# Patient Record
Sex: Male | Born: 1950 | Race: Black or African American | Hispanic: No | Smoking: Current every day smoker
Health system: Southern US, Community
[De-identification: ages and names within clinical notes are randomized; demographics above are authoritative.]

## PROBLEM LIST (undated history)

## (undated) DIAGNOSIS — J302 Other seasonal allergic rhinitis: Secondary | ICD-10-CM

## (undated) DIAGNOSIS — M545 Low back pain, unspecified: Secondary | ICD-10-CM

## (undated) DIAGNOSIS — E785 Hyperlipidemia, unspecified: Secondary | ICD-10-CM

## (undated) DIAGNOSIS — T8859XA Other complications of anesthesia, initial encounter: Secondary | ICD-10-CM

## (undated) DIAGNOSIS — T4145XA Adverse effect of unspecified anesthetic, initial encounter: Secondary | ICD-10-CM

## (undated) DIAGNOSIS — M109 Gout, unspecified: Secondary | ICD-10-CM

## (undated) DIAGNOSIS — E1121 Type 2 diabetes mellitus with diabetic nephropathy: Secondary | ICD-10-CM

## (undated) DIAGNOSIS — G8929 Other chronic pain: Secondary | ICD-10-CM

## (undated) DIAGNOSIS — F419 Anxiety disorder, unspecified: Secondary | ICD-10-CM

## (undated) DIAGNOSIS — E119 Type 2 diabetes mellitus without complications: Secondary | ICD-10-CM

## (undated) DIAGNOSIS — J189 Pneumonia, unspecified organism: Secondary | ICD-10-CM

## (undated) DIAGNOSIS — I519 Heart disease, unspecified: Secondary | ICD-10-CM

## (undated) DIAGNOSIS — N184 Chronic kidney disease, stage 4 (severe): Secondary | ICD-10-CM

## (undated) DIAGNOSIS — I10 Essential (primary) hypertension: Secondary | ICD-10-CM

## (undated) DIAGNOSIS — I2510 Atherosclerotic heart disease of native coronary artery without angina pectoris: Secondary | ICD-10-CM

## (undated) DIAGNOSIS — M1990 Unspecified osteoarthritis, unspecified site: Secondary | ICD-10-CM

## (undated) HISTORY — DX: Heart disease, unspecified: I51.9

## (undated) HISTORY — DX: Type 2 diabetes mellitus with diabetic nephropathy: E11.21

## (undated) HISTORY — DX: Anxiety disorder, unspecified: F41.9

## (undated) HISTORY — PX: CORONARY ANGIOPLASTY WITH STENT PLACEMENT: SHX49

## (undated) HISTORY — PX: LAPAROSCOPIC CHOLECYSTECTOMY: SUR755

## (undated) HISTORY — DX: Essential (primary) hypertension: I10

## (undated) HISTORY — DX: Hyperlipidemia, unspecified: E78.5

## (undated) HISTORY — PX: ESOPHAGOGASTRODUODENOSCOPY: SHX1529

---

## 2002-09-05 ENCOUNTER — Ambulatory Visit (HOSPITAL_COMMUNITY): Admission: RE | Admit: 2002-09-05 | Discharge: 2002-09-06 | Payer: Self-pay | Admitting: Cardiovascular Disease

## 2015-08-31 DIAGNOSIS — E785 Hyperlipidemia, unspecified: Secondary | ICD-10-CM | POA: Diagnosis not present

## 2015-08-31 DIAGNOSIS — N29 Other disorders of kidney and ureter in diseases classified elsewhere: Secondary | ICD-10-CM | POA: Diagnosis not present

## 2015-08-31 DIAGNOSIS — R079 Chest pain, unspecified: Secondary | ICD-10-CM | POA: Diagnosis not present

## 2015-08-31 DIAGNOSIS — E11610 Type 2 diabetes mellitus with diabetic neuropathic arthropathy: Secondary | ICD-10-CM | POA: Diagnosis not present

## 2015-09-17 DIAGNOSIS — Z720 Tobacco use: Secondary | ICD-10-CM | POA: Diagnosis not present

## 2015-09-17 DIAGNOSIS — N182 Chronic kidney disease, stage 2 (mild): Secondary | ICD-10-CM | POA: Diagnosis not present

## 2015-09-17 DIAGNOSIS — R809 Proteinuria, unspecified: Secondary | ICD-10-CM | POA: Diagnosis not present

## 2015-09-17 DIAGNOSIS — I10 Essential (primary) hypertension: Secondary | ICD-10-CM | POA: Diagnosis not present

## 2015-10-22 DIAGNOSIS — R809 Proteinuria, unspecified: Secondary | ICD-10-CM | POA: Diagnosis not present

## 2015-10-22 DIAGNOSIS — I129 Hypertensive chronic kidney disease with stage 1 through stage 4 chronic kidney disease, or unspecified chronic kidney disease: Secondary | ICD-10-CM | POA: Diagnosis not present

## 2015-10-22 DIAGNOSIS — Z79899 Other long term (current) drug therapy: Secondary | ICD-10-CM | POA: Diagnosis not present

## 2015-10-22 DIAGNOSIS — N189 Chronic kidney disease, unspecified: Secondary | ICD-10-CM | POA: Diagnosis not present

## 2015-10-22 DIAGNOSIS — E559 Vitamin D deficiency, unspecified: Secondary | ICD-10-CM | POA: Diagnosis not present

## 2015-10-22 DIAGNOSIS — N183 Chronic kidney disease, stage 3 (moderate): Secondary | ICD-10-CM | POA: Diagnosis not present

## 2015-10-22 DIAGNOSIS — D509 Iron deficiency anemia, unspecified: Secondary | ICD-10-CM | POA: Diagnosis not present

## 2015-11-10 DIAGNOSIS — N183 Chronic kidney disease, stage 3 (moderate): Secondary | ICD-10-CM | POA: Diagnosis not present

## 2015-11-10 DIAGNOSIS — I129 Hypertensive chronic kidney disease with stage 1 through stage 4 chronic kidney disease, or unspecified chronic kidney disease: Secondary | ICD-10-CM | POA: Diagnosis not present

## 2015-11-10 DIAGNOSIS — E871 Hypo-osmolality and hyponatremia: Secondary | ICD-10-CM | POA: Diagnosis not present

## 2015-11-10 DIAGNOSIS — Z79899 Other long term (current) drug therapy: Secondary | ICD-10-CM | POA: Diagnosis not present

## 2015-11-17 DIAGNOSIS — D649 Anemia, unspecified: Secondary | ICD-10-CM | POA: Diagnosis not present

## 2015-11-17 DIAGNOSIS — Z720 Tobacco use: Secondary | ICD-10-CM | POA: Diagnosis not present

## 2015-11-17 DIAGNOSIS — N183 Chronic kidney disease, stage 3 (moderate): Secondary | ICD-10-CM | POA: Diagnosis not present

## 2015-11-17 DIAGNOSIS — I10 Essential (primary) hypertension: Secondary | ICD-10-CM | POA: Diagnosis not present

## 2015-11-17 DIAGNOSIS — M109 Gout, unspecified: Secondary | ICD-10-CM | POA: Diagnosis not present

## 2015-11-17 DIAGNOSIS — E1129 Type 2 diabetes mellitus with other diabetic kidney complication: Secondary | ICD-10-CM | POA: Diagnosis not present

## 2016-01-10 ENCOUNTER — Ambulatory Visit (INDEPENDENT_AMBULATORY_CARE_PROVIDER_SITE_OTHER): Payer: Medicare Other | Admitting: Physician Assistant

## 2016-01-10 ENCOUNTER — Encounter: Payer: Self-pay | Admitting: Physician Assistant

## 2016-01-10 VITALS — BP 132/76 | HR 89 | Temp 97.3°F | Ht 66.0 in | Wt 183.0 lb

## 2016-01-10 DIAGNOSIS — Z6829 Body mass index (BMI) 29.0-29.9, adult: Secondary | ICD-10-CM

## 2016-01-10 DIAGNOSIS — E785 Hyperlipidemia, unspecified: Secondary | ICD-10-CM

## 2016-01-10 DIAGNOSIS — J3089 Other allergic rhinitis: Secondary | ICD-10-CM | POA: Diagnosis not present

## 2016-01-10 DIAGNOSIS — I10 Essential (primary) hypertension: Secondary | ICD-10-CM

## 2016-01-10 DIAGNOSIS — Z23 Encounter for immunization: Secondary | ICD-10-CM

## 2016-01-10 DIAGNOSIS — N182 Chronic kidney disease, stage 2 (mild): Secondary | ICD-10-CM | POA: Diagnosis not present

## 2016-01-10 DIAGNOSIS — E1169 Type 2 diabetes mellitus with other specified complication: Secondary | ICD-10-CM | POA: Diagnosis not present

## 2016-01-10 DIAGNOSIS — E119 Type 2 diabetes mellitus without complications: Secondary | ICD-10-CM | POA: Insufficient documentation

## 2016-01-10 DIAGNOSIS — M5136 Other intervertebral disc degeneration, lumbar region: Secondary | ICD-10-CM | POA: Diagnosis not present

## 2016-01-10 DIAGNOSIS — M1A9XX0 Chronic gout, unspecified, without tophus (tophi): Secondary | ICD-10-CM | POA: Diagnosis not present

## 2016-01-10 DIAGNOSIS — M51369 Other intervertebral disc degeneration, lumbar region without mention of lumbar back pain or lower extremity pain: Secondary | ICD-10-CM | POA: Insufficient documentation

## 2016-01-10 DIAGNOSIS — I2510 Atherosclerotic heart disease of native coronary artery without angina pectoris: Secondary | ICD-10-CM

## 2016-01-10 DIAGNOSIS — E1122 Type 2 diabetes mellitus with diabetic chronic kidney disease: Secondary | ICD-10-CM | POA: Diagnosis not present

## 2016-01-10 DIAGNOSIS — Z9861 Coronary angioplasty status: Secondary | ICD-10-CM

## 2016-01-10 LAB — BAYER DCA HB A1C WAIVED: HB A1C (BAYER DCA - WAIVED): 6.7 % (ref <7.0)

## 2016-01-10 MED ORDER — PRAVASTATIN SODIUM 40 MG PO TABS: 40.0000 mg | ORAL_TABLET | Freq: Every day | ORAL | 11 refills | Status: DC

## 2016-01-10 MED ORDER — VENTOLIN HFA 108 (90 BASE) MCG/ACT IN AERS: 2.0000 | INHALATION_SPRAY | Freq: Four times a day (QID) | RESPIRATORY_TRACT | 6 refills | Status: DC | PRN

## 2016-01-10 MED ORDER — PIOGLITAZONE HCL 45 MG PO TABS: 45.0000 mg | ORAL_TABLET | Freq: Every day | ORAL | 11 refills | Status: DC

## 2016-01-10 MED ORDER — UNIFINE PENTIPS 32G X 4 MM MISC: 1.0000 [IU] | Freq: Once | 11 refills | Status: AC

## 2016-01-10 MED ORDER — PRASUGREL HCL 10 MG PO TABS: 10.0000 mg | ORAL_TABLET | Freq: Every day | ORAL | 11 refills | Status: DC

## 2016-01-10 MED ORDER — CETIRIZINE HCL 10 MG PO TABS: 10.0000 mg | ORAL_TABLET | Freq: Every day | ORAL | 11 refills | Status: DC

## 2016-01-10 MED ORDER — LYRICA 200 MG PO CAPS: 200.0000 mg | ORAL_CAPSULE | Freq: Two times a day (BID) | ORAL | 5 refills | Status: DC

## 2016-01-10 MED ORDER — ALLOPURINOL 100 MG PO TABS: 100.0000 mg | ORAL_TABLET | Freq: Three times a day (TID) | ORAL | 6 refills | Status: DC

## 2016-01-10 MED ORDER — HYDROCHLOROTHIAZIDE 25 MG PO TABS: 25.0000 mg | ORAL_TABLET | Freq: Two times a day (BID) | ORAL | 11 refills | Status: DC

## 2016-01-10 MED ORDER — HYDROCODONE-ACETAMINOPHEN 10-325 MG PO TABS: 1.0000 | ORAL_TABLET | Freq: Three times a day (TID) | ORAL | 0 refills | Status: DC | PRN

## 2016-01-10 MED ORDER — CYCLOBENZAPRINE HCL 10 MG PO TABS: 10.0000 mg | ORAL_TABLET | Freq: Three times a day (TID) | ORAL | 5 refills | Status: DC

## 2016-01-10 MED ORDER — CARVEDILOL 25 MG PO TABS: 25.0000 mg | ORAL_TABLET | Freq: Two times a day (BID) | ORAL | 11 refills | Status: DC

## 2016-01-10 MED ORDER — FAMOTIDINE 20 MG PO TABS: 20.0000 mg | ORAL_TABLET | Freq: Every day | ORAL | 11 refills | Status: DC

## 2016-01-10 MED ORDER — HYDROCODONE-ACETAMINOPHEN 10-325 MG PO TABS: 1.0000 | ORAL_TABLET | Freq: Four times a day (QID) | ORAL | 0 refills | Status: DC | PRN

## 2016-01-10 MED ORDER — ULORIC 80 MG PO TABS: 80.0000 mg | ORAL_TABLET | Freq: Every day | ORAL | 11 refills | Status: DC

## 2016-01-10 MED ORDER — ALPRAZOLAM 0.5 MG PO TABS: 0.5000 mg | ORAL_TABLET | Freq: Two times a day (BID) | ORAL | 5 refills | Status: DC | PRN

## 2016-01-10 MED ORDER — VICTOZA 18 MG/3ML ~~LOC~~ SOPN: 1.8000 mg | PEN_INJECTOR | Freq: Once | SUBCUTANEOUS | 11 refills | Status: DC

## 2016-01-10 NOTE — Progress Notes (Signed)
BP 132/76   Pulse 89   Temp 97.3 F (36.3 C) (Oral)   Ht 5' 6"  (1.676 m)   Wt 183 lb (83 kg)   BMI 29.54 kg/m    Subjective:    Patient ID: Tommy Douglas, male    DOB: Apr 22, 1950, 65 y.o.   MRN: [id number]  Tommy Douglas is a 65 y.o. male presenting on 01/10/2016 for Follow-up  HPI Patient here to be established as new patient at [facility].  This patient is known to me from [facility]. This patient comes in to be established. He is a long-standing multiple medical conditions. He is not having any specific problems at this time. All of his medications reviewed and will be refilled as needed. His past medical history is positive for coronary artery disease, non-insulin-dependent diabetes, chronic kidney disease, degenerative disc disease, gout, depression and anxiety, hyperlipidemia. His records are reviewed. He has an appointment next month with his nephrologist.   Relevant past medical, surgical, family and social history reviewed and updated as indicated. Interim medical history since our last visit reviewed. Allergies and medications reviewed and updated.   Data reviewed from any sources in EPIC.  Review of Systems  Constitutional: Negative for appetite change, fatigue and fever.  HENT: Negative.   Eyes: Negative.  Negative for pain and visual disturbance.  Respiratory: Negative.  Negative for cough, chest tightness, shortness of breath and wheezing.   Cardiovascular: Negative.  Negative for chest pain, palpitations and leg swelling.  Gastrointestinal: Negative.  Negative for abdominal pain, diarrhea, nausea and vomiting.  Endocrine: Negative.   Genitourinary: Negative.   Musculoskeletal: Positive for arthralgias, back pain, joint swelling and myalgias.  Skin: Negative.  Negative for color change and rash.  Neurological: Negative.  Negative for weakness, numbness and headaches.  Psychiatric/Behavioral: Negative.      Social History    Social History  . Marital status: Single    Spouse name: N/A  . Number of children: N/A  . Years of education: N/A   Occupational History  . Not on file.   Social History Main Topics  . Smoking status: Current Every Day Smoker  . Smokeless tobacco: Never Used  . Alcohol use No  . Drug use: No  . Sexual activity: Not on file   Other Topics Concern  . Not on file   Social History Narrative  . No narrative on file    Past Surgical History:  Procedure Laterality Date  . CHOLECYSTECTOMY    . HEART STENT      History reviewed. No pertinent family history.    Medication List       Accurate as of 01/10/16 10:20 PM. Always use your most recent med list.          allopurinol 100 MG tablet Commonly known as:  ZYLOPRIM Take 1 tablet (100 mg total) by mouth 3 (three) times daily.   ALPRAZolam 0.5 MG tablet Commonly known as:  XANAX Take 1 tablet (0.5 mg total) by mouth 2 (two) times daily as needed.   carvedilol 25 MG tablet Commonly known as:  COREG Take 1 tablet (25 mg total) by mouth 2 (two) times daily.   cetirizine 10 MG tablet Commonly known as:  ZYRTEC Take 1 tablet (10 mg total) by mouth daily.   cyclobenzaprine 10 MG tablet Commonly known as:  FLEXERIL Take 1 tablet (10 mg total) by mouth 3 (three) times daily.   famotidine 20 MG tablet Commonly known  as:  PEPCID Take 1 tablet (20 mg total) by mouth daily.   hydrochlorothiazide 25 MG tablet Commonly known as:  HYDRODIURIL Take 1 tablet (25 mg total) by mouth 2 (two) times daily.   HYDROcodone-acetaminophen 10-325 MG tablet Commonly known as:  NORCO Take 1 tablet by mouth every 6 (six) hours as needed.   HYDROcodone-acetaminophen 10-325 MG tablet Commonly known as:  NORCO Take 1 tablet by mouth every 8 (eight) hours as needed.   HYDROcodone-acetaminophen 10-325 MG tablet Commonly known as:  NORCO Take 1 tablet by mouth every 8 (eight) hours as needed.   LYRICA 200 MG capsule Generic  drug:  pregabalin Take 1 capsule (200 mg total) by mouth 2 (two) times daily.   pioglitazone 45 MG tablet Commonly known as:  ACTOS Take 1 tablet (45 mg total) by mouth daily.   prasugrel 10 MG Tabs tablet Commonly known as:  EFFIENT Take 1 tablet (10 mg total) by mouth daily.   pravastatin 40 MG tablet Commonly known as:  PRAVACHOL Take 1 tablet (40 mg total) by mouth daily.   predniSONE 10 MG tablet Commonly known as:  DELTASONE Take 10 mg by mouth daily.   ULORIC 80 MG Tabs Generic drug:  Febuxostat Take 1 tablet (80 mg total) by mouth daily.   UNIFINE PENTIPS 32G X 4 MM Misc Generic drug:  Insulin Pen Needle Inject 1 Units into the skin once.   VENTOLIN HFA 108 (90 Base) MCG/ACT inhaler Generic drug:  albuterol Inhale 2 puffs into the lungs every 6 (six) hours as needed for wheezing or shortness of breath.   VICTOZA 18 MG/3ML Sopn Generic drug:  liraglutide Inject 0.3 mLs (1.8 mg total) into the skin once.          Objective:    BP 132/76   Pulse 89   Temp 97.3 F (36.3 C) (Oral)   Ht 5' 6"  (1.676 m)   Wt 183 lb (83 kg)   BMI 29.54 kg/m   Allergies  Allergen Reactions  . Atorvastatin     All statins make him cough   Wt Readings from Last 3 Encounters:  01/10/16 183 lb (83 kg)    Physical Exam  Constitutional: He appears well-developed and well-nourished. No distress.  HENT:  Head: Normocephalic and atraumatic.  Eyes: Conjunctivae and EOM are normal. Pupils are equal, round, and reactive to light.  Neck: Normal range of motion. Neck supple.  Cardiovascular: Normal rate, regular rhythm and normal heart sounds.   Pulmonary/Chest: Effort normal and breath sounds normal. No respiratory distress.  Abdominal: Soft. Bowel sounds are normal.  Musculoskeletal: Normal range of motion.       Lumbar back: He exhibits tenderness, pain and spasm.  Neurological:  Reflex Scores:      Patellar reflexes are 1+ on the right side and 3+ on the left side. Skin:  Skin is warm and dry.  Psychiatric: He has a normal mood and affect. His behavior is normal.  Nursing note and vitals reviewed.   Results for orders placed or performed in visit on 01/10/16  Bayer DCA Hb A1c Waived  Result Value Ref Range   Bayer DCA Hb A1c Waived 6.7 <7.0 %      Assessment & Plan:   1. Essential hypertension - CBC with Differential/Platelet - prasugrel (EFFIENT) 10 MG TABS tablet; Take 1 tablet (10 mg total) by mouth daily.  Dispense: 30 tablet; Refill: 11 - carvedilol (COREG) 25 MG tablet; Take 1 tablet (25 mg total) by  mouth 2 (two) times daily.  Dispense: 60 tablet; Refill: 11 - hydrochlorothiazide (HYDRODIURIL) 25 MG tablet; Take 1 tablet (25 mg total) by mouth 2 (two) times daily.  Dispense: 60 tablet; Refill: 11  2. Coronary artery disease involving native heart, angina presence unspecified, unspecified vessel or lesion type - CBC with Differential/Platelet - CMP14+EGFR - Lipid panel - carvedilol (COREG) 25 MG tablet; Take 1 tablet (25 mg total) by mouth 2 (two) times daily.  Dispense: 60 tablet; Refill: 11  3. 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  Dispense: 30 each; Refill: 11 - VICTOZA 18 MG/3ML SOPN; Inject 0.3 mLs (1.8 mg total) into the skin once.  Dispense: 6 mL; Refill: 11 - pioglitazone (ACTOS) 45 MG tablet; Take 1 tablet (45 mg total) by mouth daily.  Dispense: 30 tablet; Refill: 11  4. Hyperlipidemia associated with type 2 diabetes mellitus (HCC) - pravastatin (PRAVACHOL) 40 MG tablet; Take 1 tablet (40 mg total) by mouth daily.  Dispense: 30 tablet; Refill: 11  5. Chronic gout without tophus, unspecified cause, unspecified site - Lipid panel - allopurinol (ZYLOPRIM) 100 MG tablet; Take 1 tablet (100 mg total) by mouth 3 (three) times daily.  Dispense: 90 tablet;  Refill: 6 - ULORIC 80 MG TABS; Take 1 tablet (80 mg total) by mouth daily.  Dispense: 30 tablet; Refill: 11  6. 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  Dispense: 30 tablet; Refill: 11  7. DDD (degenerative disc disease), lumbar - cyclobenzaprine (FLEXERIL) 10 MG tablet; Take 1 tablet (10 mg total) by mouth 3 (three) times daily.  Dispense: 90 tablet; Refill: 5 - LYRICA 200 MG capsule; Take 1 capsule (200 mg total) by mouth 2 (two) times daily.  Dispense: 60 capsule; Refill: 5 - HYDROcodone-acetaminophen (NORCO) 10-325 MG tablet; Take 1 tablet by mouth every 6 (six) hours as needed.  Dispense: 120 tablet; Refill: 0 - HYDROcodone-acetaminophen (NORCO) 10-325 MG tablet; Take 1 tablet by mouth every 8 (eight) hours as needed.  Dispense: 120 tablet; Refill: 0 - HYDROcodone-acetaminophen (NORCO) 10-325 MG tablet; Take 1 tablet by mouth every 8 (eight) hours as needed.  Dispense: 120 tablet; Refill: 0   Continue all other maintenance medications as listed above. Educational handout given for kidney disease  Follow up plan: Return in about 3 months (around 04/11/2016).  Terald Sleeper PA-C [facility] [address]  [place], [place] [address] [phone]   01/10/2016, 10:20 PM

## 2016-01-10 NOTE — Patient Instructions (Signed)
Chronic Kidney Disease °Chronic kidney disease occurs when the kidneys are damaged over a long period. The kidneys are two organs that lie on either side of the spine between the middle of the back and the front of the abdomen. The kidneys: °· Remove wastes and extra water from the blood. °· Produce important hormones. These help keep bones strong, regulate blood pressure, and help create red blood cells. °· Balance the fluids and chemicals in the blood and tissues. °A small amount of kidney damage may not cause problems, but a large amount of damage may make it difficult or impossible for the kidneys to work the way they should. If steps are not taken to slow down the kidney damage or stop it from getting worse, the kidneys may stop working permanently. Most of the time, chronic kidney disease does not go away. However, it can often be controlled, and those with the disease can usually live normal lives. °CAUSES °The most common causes of chronic kidney disease are diabetes and high blood pressure (hypertension). Chronic kidney disease may also be caused by: °· Diseases that cause the kidneys' filters to become inflamed. °· Diseases that affect the immune system. °· Genetic diseases. °· Medicines that damage the kidneys, such as anti-inflammatory medicines. °· Poisoning or exposure to toxic substances. °· A reoccurring kidney or urinary infection. °· A problem with urine flow. This may be caused by: °¨ Cancer. °¨ Kidney stones. °¨ An enlarged prostate in males. °SIGNS AND SYMPTOMS °Because the kidney damage in chronic kidney disease occurs slowly, symptoms develop slowly and may not be obvious until the kidney damage becomes severe. A person may have a kidney disease for years without showing any symptoms. Symptoms can include: °· Swelling (edema) of the legs, ankles, or feet. °· Tiredness (lethargy). °· Nausea or vomiting. °· Confusion. °· Problems with urination, such as: °¨ Decreased urine  production. °¨ Frequent urination, especially at night. °¨ Frequent accidents in children who are potty trained. °· Muscle twitches and cramps. °· Shortness of breath. °· Weakness. °· Persistent itchiness. °· Loss of appetite. °· Metallic taste in the mouth. °· Trouble sleeping. °· Slowed development in children. °· Short stature in children. °DIAGNOSIS °Chronic kidney disease may be detected and diagnosed by tests, including blood, urine, imaging, or kidney biopsy tests. °TREATMENT °Most chronic kidney diseases cannot be cured. Treatment usually involves relieving symptoms and preventing or slowing the progression of the disease. Treatment may include: °· A special diet. You may need to avoid alcohol and foods that are salty and high in potassium. °· Medicines. These may: °¨ Lower blood pressure. °¨ Relieve anemia. °¨ Relieve swelling. °¨ Protect the bones. °HOME CARE INSTRUCTIONS °· Follow your prescribed diet.  Your health care provider may instruct you to limit daily salt (sodium) and protein intake. °· Take medicines only as directed by your health care provider. Do not take any new medicines (prescription, over-the-counter, or nutritional supplements) unless approved by your health care provider. Many medicines can worsen your kidney damage or need to have the dose adjusted.   °· Quit smoking if you smoke. Talk to your health care provider about a smoking cessation program. °· Keep all follow-up visits as directed by your health care provider. °· Monitor your blood pressure. °· Start or continue an exercise plan. °· Get immunizations as directed by your health care provider. °· Take vitamin and mineral supplements as directed by your health care provider. °SEEK IMMEDIATE MEDICAL CARE IF: °· Your symptoms get worse or you develop   new symptoms. °· You develop symptoms of end-stage kidney disease. These include: °¨ Headaches. °¨ Abnormally dark or light skin. °¨ Numbness in the hands or feet. °¨ Easy  bruising. °¨ Frequent hiccups. °¨ Menstruation stops. °· You have a fever. °· You have decreased urine production. °· You have pain or bleeding when urinating. °MAKE SURE YOU: °· Understand these instructions. °· Will watch your condition. °· Will get help right away if you are not doing well or get worse. °FOR MORE INFORMATION  °· American Association of Kidney Patients: www.aakp.org °· National Kidney Foundation: www.kidney.org °· American Kidney Fund: www.akfinc.org °· [employer] [facility]: www.lifeoptions.org and www.kidneyschool.org °  °This information is not intended to replace advice given to you by your health care provider. Make sure you discuss any questions you have with your health care provider. °  °Document Released: 12/21/2007 Document Revised: 04/03/2014 Document Reviewed: 11/10/2011 °Elsevier Interactive Patient Education ©2016 [employer]. ° °

## 2016-01-12 LAB — CBC WITH DIFFERENTIAL/PLATELET
BASOS ABS: 0.1 10*3/uL (ref 0.0–0.2)
BASOS: 1 %
EOS (ABSOLUTE): 0.5 10*3/uL — AB (ref 0.0–0.4)
Eos: 6 %
HEMOGLOBIN: 12.4 g/dL — AB (ref 12.6–17.7)
Hematocrit: 38.2 % (ref 37.5–51.0)
IMMATURE GRANS (ABS): 0 10*3/uL (ref 0.0–0.1)
IMMATURE GRANULOCYTES: 0 %
LYMPHS: 30 %
Lymphocytes Absolute: 2.6 10*3/uL (ref 0.7–3.1)
MCH: 27.7 pg (ref 26.6–33.0)
MCHC: 32.5 g/dL (ref 31.5–35.7)
MCV: 86 fL (ref 79–97)
MONOCYTES: 7 %
Monocytes Absolute: 0.6 10*3/uL (ref 0.1–0.9)
NEUTROS ABS: 4.8 10*3/uL (ref 1.4–7.0)
NEUTROS PCT: 56 %
PLATELETS: 304 10*3/uL (ref 150–379)
RBC: 4.47 x10E6/uL (ref 4.14–5.80)
RDW: 13.3 % (ref 12.3–15.4)
WBC: 8.5 10*3/uL (ref 3.4–10.8)

## 2016-01-12 LAB — CMP14+EGFR
ALBUMIN: 4.1 g/dL (ref 3.6–4.8)
ALT: 12 IU/L (ref 0–44)
AST: 20 IU/L (ref 0–40)
Albumin/Globulin Ratio: 1.1 — ABNORMAL LOW (ref 1.2–2.2)
Alkaline Phosphatase: 91 IU/L (ref 39–117)
BUN / CREAT RATIO: 14 (ref 10–24)
BUN: 24 mg/dL (ref 8–27)
Bilirubin Total: 0.3 mg/dL (ref 0.0–1.2)
CALCIUM: 10.1 mg/dL (ref 8.6–10.2)
CO2: 21 mmol/L (ref 18–29)
Chloride: 98 mmol/L (ref 96–106)
Creatinine, Ser: 1.74 mg/dL — ABNORMAL HIGH (ref 0.76–1.27)
GFR, EST AFRICAN AMERICAN: 47 mL/min/{1.73_m2} — AB (ref >59)
GFR, EST NON AFRICAN AMERICAN: 40 mL/min/{1.73_m2} — AB (ref >59)
GLUCOSE: 144 mg/dL — AB (ref 65–99)
Globulin, Total: 3.7 g/dL (ref 1.5–4.5)
Potassium: 4.5 mmol/L (ref 3.5–5.2)
Sodium: 137 mmol/L (ref 134–144)
TOTAL PROTEIN: 7.8 g/dL (ref 6.0–8.5)

## 2016-01-12 LAB — LIPID PANEL
CHOL/HDL RATIO: 9.6 ratio — AB (ref 0.0–5.0)
Cholesterol, Total: 259 mg/dL — ABNORMAL HIGH (ref 100–199)
HDL: 27 mg/dL — AB (ref >39)
LDL CALC: 158 mg/dL — AB (ref 0–99)
Triglycerides: 368 mg/dL — ABNORMAL HIGH (ref 0–149)
VLDL CHOLESTEROL CAL: 74 mg/dL — AB (ref 5–40)

## 2016-01-12 MED ORDER — CHOLINE FENOFIBRATE 135 MG PO CPDR: 135.0000 mg | DELAYED_RELEASE_CAPSULE | Freq: Every day | ORAL | 2 refills | Status: DC

## 2016-01-12 NOTE — Addendum Note (Signed)
Addended by: Thana Ates on: 01/12/2016 08:37 AM   Modules accepted: Orders

## 2016-02-23 DIAGNOSIS — R809 Proteinuria, unspecified: Secondary | ICD-10-CM | POA: Diagnosis not present

## 2016-02-23 DIAGNOSIS — E1129 Type 2 diabetes mellitus with other diabetic kidney complication: Secondary | ICD-10-CM | POA: Diagnosis not present

## 2016-02-23 DIAGNOSIS — N183 Chronic kidney disease, stage 3 (moderate): Secondary | ICD-10-CM | POA: Diagnosis not present

## 2016-02-23 DIAGNOSIS — I10 Essential (primary) hypertension: Secondary | ICD-10-CM | POA: Diagnosis not present

## 2016-04-25 ENCOUNTER — Ambulatory Visit (INDEPENDENT_AMBULATORY_CARE_PROVIDER_SITE_OTHER): Payer: Medicare Other | Admitting: Physician Assistant

## 2016-04-25 VITALS — BP 134/78 | HR 100 | Temp 97.2°F | Ht 66.0 in | Wt 186.2 lb

## 2016-04-25 DIAGNOSIS — N182 Chronic kidney disease, stage 2 (mild): Secondary | ICD-10-CM

## 2016-04-25 DIAGNOSIS — I2510 Atherosclerotic heart disease of native coronary artery without angina pectoris: Secondary | ICD-10-CM

## 2016-04-25 DIAGNOSIS — M5136 Other intervertebral disc degeneration, lumbar region: Secondary | ICD-10-CM | POA: Diagnosis not present

## 2016-04-25 DIAGNOSIS — E1122 Type 2 diabetes mellitus with diabetic chronic kidney disease: Secondary | ICD-10-CM

## 2016-04-25 DIAGNOSIS — E785 Hyperlipidemia, unspecified: Secondary | ICD-10-CM

## 2016-04-25 DIAGNOSIS — M1A9XX0 Chronic gout, unspecified, without tophus (tophi): Secondary | ICD-10-CM

## 2016-04-25 DIAGNOSIS — J3089 Other allergic rhinitis: Secondary | ICD-10-CM

## 2016-04-25 DIAGNOSIS — E1169 Type 2 diabetes mellitus with other specified complication: Secondary | ICD-10-CM | POA: Diagnosis not present

## 2016-04-25 DIAGNOSIS — I10 Essential (primary) hypertension: Secondary | ICD-10-CM | POA: Diagnosis not present

## 2016-04-25 DIAGNOSIS — R5383 Other fatigue: Secondary | ICD-10-CM | POA: Diagnosis not present

## 2016-04-25 MED ORDER — PRAVASTATIN SODIUM 40 MG PO TABS: 40.0000 mg | ORAL_TABLET | Freq: Every day | ORAL | 11 refills | Status: DC

## 2016-04-25 MED ORDER — PIOGLITAZONE HCL 45 MG PO TABS: 45.0000 mg | ORAL_TABLET | Freq: Every day | ORAL | 11 refills | Status: DC

## 2016-04-25 MED ORDER — HYDROCODONE-ACETAMINOPHEN 10-325 MG PO TABS: 1.0000 | ORAL_TABLET | Freq: Three times a day (TID) | ORAL | 0 refills | Status: DC | PRN

## 2016-04-25 MED ORDER — VICTOZA 18 MG/3ML ~~LOC~~ SOPN: 1.8000 mg | PEN_INJECTOR | Freq: Once | SUBCUTANEOUS | 11 refills | Status: DC

## 2016-04-25 MED ORDER — CYCLOBENZAPRINE HCL 10 MG PO TABS: 10.0000 mg | ORAL_TABLET | Freq: Three times a day (TID) | ORAL | 5 refills | Status: DC

## 2016-04-25 MED ORDER — CETIRIZINE HCL 10 MG PO TABS: 10.0000 mg | ORAL_TABLET | Freq: Every day | ORAL | 11 refills | Status: DC

## 2016-04-25 MED ORDER — CARVEDILOL 25 MG PO TABS: 25.0000 mg | ORAL_TABLET | Freq: Two times a day (BID) | ORAL | 11 refills | Status: DC

## 2016-04-25 MED ORDER — HYDROCODONE-ACETAMINOPHEN 10-325 MG PO TABS: 1.0000 | ORAL_TABLET | Freq: Four times a day (QID) | ORAL | 0 refills | Status: DC | PRN

## 2016-04-25 MED ORDER — HYDROCHLOROTHIAZIDE 25 MG PO TABS: 25.0000 mg | ORAL_TABLET | Freq: Two times a day (BID) | ORAL | 11 refills | Status: DC

## 2016-04-25 MED ORDER — ULORIC 80 MG PO TABS: 80.0000 mg | ORAL_TABLET | Freq: Every day | ORAL | 11 refills | Status: DC

## 2016-04-25 MED ORDER — CHOLINE FENOFIBRATE 135 MG PO CPDR
135.0000 mg | DELAYED_RELEASE_CAPSULE | Freq: Every day | ORAL | 11 refills | Status: DC
Start: 2016-04-25 — End: 2016-08-02

## 2016-04-25 MED ORDER — ALPRAZOLAM 0.5 MG PO TABS: 0.5000 mg | ORAL_TABLET | Freq: Two times a day (BID) | ORAL | 5 refills | Status: DC | PRN

## 2016-04-25 MED ORDER — PRASUGREL HCL 10 MG PO TABS: 10.0000 mg | ORAL_TABLET | Freq: Every day | ORAL | 11 refills | Status: DC

## 2016-04-25 NOTE — Patient Instructions (Addendum)
Carbohydrate Counting for Diabetes Mellitus, Adult Carbohydrate counting is a method for keeping track of how many carbohydrates you eat. Eating carbohydrates naturally increases the amount of sugar (glucose) in the blood. Counting how many carbohydrates you eat helps keep your blood glucose within normal limits, which helps you manage your diabetes (diabetes mellitus). It is important to know how many carbohydrates you can safely have in each meal. This is different for every person. A diet and nutrition specialist (registered dietitian) can help you make a meal plan and calculate how many carbohydrates you should have at each meal and snack. Carbohydrates are found in the following foods:  Grains, such as breads and cereals.  Dried beans and soy products.  Starchy vegetables, such as potatoes, peas, and corn.  Fruit and fruit juices.  Milk and yogurt.  Sweets and snack foods, such as cake, cookies, candy, chips, and soft drinks. How do I count carbohydrates? There are two ways to count carbohydrates in food. You can use either of the methods or a combination of both. Reading "Nutrition Facts" on packaged food  The "Nutrition Facts" list is included on the labels of almost all packaged foods and beverages in the [place] It includes:  The serving size.  Information about nutrients in each serving, including the grams (g) of carbohydrate per serving. To use the "Nutrition Facts":  Decide how many servings you will have.  Multiply the number of servings by the number of carbohydrates per serving.  The resulting number is the total amount of carbohydrates that you will be having. Learning standard serving sizes of other foods  When you eat foods containing carbohydrates that are not packaged or do not include "Nutrition Facts" on the label, you need to measure the servings in order to count the amount of carbohydrates:  Measure the foods that you will eat with a food scale or measuring  cup, if needed.  Decide how many standard-size servings you will eat.  Multiply the number of servings by 15. Most carbohydrate-rich foods have about 15 g of carbohydrates per serving.  For example, if you eat 8 oz (170 g) of strawberries, you will have eaten 2 servings and 30 g of carbohydrates (2 servings x 15 g = 30 g).  For foods that have more than one food mixed, such as soups and casseroles, you must count the carbohydrates in each food that is included. The following list contains standard serving sizes of common carbohydrate-rich foods. Each of these servings has about 15 g of carbohydrates:   hamburger bun or  English muffin.   oz (15 mL) syrup.   oz (14 g) jelly.  1 slice of bread.  1 six-inch tortilla.  3 oz (85 g) cooked rice or pasta.  4 oz (113 g) cooked dried beans.  4 oz (113 g) starchy vegetable, such as peas, corn, or potatoes.  4 oz (113 g) hot cereal.  4 oz (113 g) mashed potatoes or  of a large baked potato.  4 oz (113 g) canned or frozen fruit.  4 oz (120 mL) fruit juice.  4-6 crackers.  6 chicken nuggets.  6 oz (170 g) unsweetened dry cereal.  6 oz (170 g) plain fat-free yogurt or yogurt sweetened with artificial sweeteners.  8 oz (240 mL) milk.  8 oz (170 g) fresh fruit or one small piece of fruit.  24 oz (680 g) popped popcorn. Example of carbohydrate counting Sample meal  3 oz (85 g) chicken breast.  6 oz (  170 g) brown rice.  4 oz (113 g) corn.  8 oz (240 mL) milk.  8 oz (170 g) strawberries with sugar-free whipped topping. Carbohydrate calculation 1. Identify the foods that contain carbohydrates:  Rice.  Corn.  Milk.  Strawberries. 2. Calculate how many servings you have of each food:  2 servings rice.  1 serving corn.  1 serving milk.  1 serving strawberries. 3. Multiply each number of servings by 15 g:  2 servings rice x 15 g = 30 g.  1 serving corn x 15 g = 15 g.  1 serving milk x 15 g = 15  g.  1 serving strawberries x 15 g = 15 g. 4. Add together all of the amounts to find the total grams of carbohydrates eaten:  30 g + 15 g + 15 g + 15 g = 75 g of carbohydrates total. This information is not intended to replace advice given to you by your health care provider. Make sure you discuss any questions you have with your health care provider. Document Released: 03/13/2005 Document Revised: 10/01/2015 Document Reviewed: 08/25/2015 Elsevier Interactive Patient Education  2017 [employer]. bronchitis

## 2016-04-26 LAB — CMP14+EGFR
A/G RATIO: 1.2 (ref 1.2–2.2)
ALK PHOS: 85 IU/L (ref 39–117)
ALT: 11 IU/L (ref 0–44)
AST: 17 IU/L (ref 0–40)
Albumin: 4.1 g/dL (ref 3.6–4.8)
BUN/Creatinine Ratio: 11 (ref 10–24)
BUN: 21 mg/dL (ref 8–27)
Bilirubin Total: 0.3 mg/dL (ref 0.0–1.2)
CO2: 26 mmol/L (ref 18–29)
Calcium: 10.1 mg/dL (ref 8.6–10.2)
Chloride: 97 mmol/L (ref 96–106)
Creatinine, Ser: 1.83 mg/dL — ABNORMAL HIGH (ref 0.76–1.27)
GFR calc Af Amer: 44 mL/min/{1.73_m2} — ABNORMAL LOW (ref >59)
GFR calc non Af Amer: 38 mL/min/{1.73_m2} — ABNORMAL LOW (ref >59)
GLOBULIN, TOTAL: 3.4 g/dL (ref 1.5–4.5)
Glucose: 156 mg/dL — ABNORMAL HIGH (ref 65–99)
POTASSIUM: 4.3 mmol/L (ref 3.5–5.2)
SODIUM: 138 mmol/L (ref 134–144)
Total Protein: 7.5 g/dL (ref 6.0–8.5)

## 2016-04-26 LAB — LIPID PANEL
CHOL/HDL RATIO: 7.6 ratio — AB (ref 0.0–5.0)
Cholesterol, Total: 191 mg/dL (ref 100–199)
HDL: 25 mg/dL — ABNORMAL LOW (ref >39)
LDL CALC: 108 mg/dL — AB (ref 0–99)
Triglycerides: 292 mg/dL — ABNORMAL HIGH (ref 0–149)
VLDL Cholesterol Cal: 58 mg/dL — ABNORMAL HIGH (ref 5–40)

## 2016-04-26 LAB — THYROID PANEL WITH TSH
Free Thyroxine Index: 2.2 (ref 1.2–4.9)
T3 Uptake Ratio: 28 % (ref 24–39)
T4 TOTAL: 7.7 ug/dL (ref 4.5–12.0)
TSH: 2.17 u[IU]/mL (ref 0.450–4.500)

## 2016-04-27 ENCOUNTER — Encounter: Payer: Self-pay | Admitting: Physician Assistant

## 2016-04-27 NOTE — Progress Notes (Signed)
BP 134/78   Pulse 100   Temp 97.2 F (36.2 C) (Oral)   Ht 5' 6"  (1.676 m)   Wt 186 lb 3.2 oz (84.5 kg)   BMI 30.05 kg/m    Subjective:    Patient ID: Tommy Douglas, male    DOB: 04/15/1950, 66 y.o.   MRN: [id number]  HPI: Tommy Douglas is a 66 y.o. male presenting on 04/25/2016 for Diabetes; Hypertension; Hyperlipidemia; and Fatigue  This patient comes in for periodic recheck on medications and conditions. All medications are reviewed today. There are no reports of any problems with the medications. All of the medical conditions are reviewed and updated.  Lab work is reviewed and will be ordered as medically necessary. .  He does a great increase in fatigue. He does not note that it is related to changes in sugars or urine output. Has been seeing Dr. Lowanda Foster for the kidney failure. He states it is very cold at times. There is no increased amount of gout outbreaks.  Past Medical History:  Diagnosis Date  . Allergy   . Anxiety   . Diabetes mellitus without complication ([place])   . Heart disease   . Hyperlipidemia   . Hypertension    Relevant past medical, surgical, family and social history reviewed and updated as indicated. Interim medical history since our last visit reviewed. Allergies and medications reviewed and updated. DATA REVIEWED: CHART IN EPIC  Social History   Social History  . Marital status: Single    Spouse name: N/A  . Number of children: N/A  . Years of education: N/A   Occupational History  . Not on file.   Social History Main Topics  . Smoking status: Current Every Day Smoker  . Smokeless tobacco: Never Used  . Alcohol use No  . Drug use: No  . Sexual activity: Not on file   Other Topics Concern  . Not on file   Social History Narrative  . No narrative on file    Past Surgical History:  Procedure Laterality Date  . CHOLECYSTECTOMY    . HEART STENT      No family history on file.  Review of Systems  Constitutional: Positive for  fatigue. Negative for appetite change and unexpected weight change.  HENT: Negative.   Eyes: Negative.  Negative for pain and visual disturbance.  Respiratory: Negative.  Negative for cough, chest tightness, shortness of breath and wheezing.   Cardiovascular: Negative.  Negative for chest pain, palpitations and leg swelling.  Gastrointestinal: Negative.  Negative for abdominal pain, diarrhea, nausea and vomiting.  Endocrine: Positive for cold intolerance. Negative for polydipsia, polyphagia and polyuria.  Genitourinary: Negative.   Musculoskeletal: Negative.   Skin: Negative.  Negative for color change and rash.  Neurological: Negative.  Negative for dizziness, weakness, numbness and headaches.  Psychiatric/Behavioral: Negative.     Allergies as of 04/25/2016      Reactions   Atorvastatin    All statins make him cough      Medication List       Accurate as of 04/25/16 11:59 PM. Always use your most recent med list.          ALPRAZolam 0.5 MG tablet Commonly known as:  XANAX Take 1 tablet (0.5 mg total) by mouth 2 (two) times daily as needed.   carvedilol 25 MG tablet Commonly known as:  COREG Take 1 tablet (25 mg total) by mouth 2 (two) times daily.   cetirizine 10 MG tablet Commonly  known as:  ZYRTEC Take 1 tablet (10 mg total) by mouth daily.   Choline Fenofibrate 135 MG capsule Take 1 capsule (135 mg total) by mouth daily.   cyclobenzaprine 10 MG tablet Commonly known as:  FLEXERIL Take 1 tablet (10 mg total) by mouth 3 (three) times daily.   famotidine 20 MG tablet Commonly known as:  PEPCID Take 1 tablet (20 mg total) by mouth daily.   hydrochlorothiazide 25 MG tablet Commonly known as:  HYDRODIURIL Take 1 tablet (25 mg total) by mouth 2 (two) times daily.   HYDROcodone-acetaminophen 10-325 MG tablet Commonly known as:  NORCO Take 1 tablet by mouth every 6 (six) hours as needed.   HYDROcodone-acetaminophen 10-325 MG tablet Commonly known as:  NORCO Take  1 tablet by mouth every 8 (eight) hours as needed.   HYDROcodone-acetaminophen 10-325 MG tablet Commonly known as:  NORCO Take 1 tablet by mouth every 8 (eight) hours as needed.   LYRICA 200 MG capsule Generic drug:  pregabalin Take 1 capsule (200 mg total) by mouth 2 (two) times daily.   pioglitazone 45 MG tablet Commonly known as:  ACTOS Take 1 tablet (45 mg total) by mouth daily.   prasugrel 10 MG Tabs tablet Commonly known as:  EFFIENT Take 1 tablet (10 mg total) by mouth daily.   pravastatin 40 MG tablet Commonly known as:  PRAVACHOL Take 1 tablet (40 mg total) by mouth daily.   predniSONE 10 MG tablet Commonly known as:  DELTASONE Take 10 mg by mouth daily.   ULORIC 80 MG Tabs Generic drug:  Febuxostat Take 1 tablet (80 mg total) by mouth daily.   VENTOLIN HFA 108 (90 Base) MCG/ACT inhaler Generic drug:  albuterol Inhale 2 puffs into the lungs every 6 (six) hours as needed for wheezing or shortness of breath.   VICTOZA 18 MG/3ML Sopn Generic drug:  liraglutide Inject 0.3 mLs (1.8 mg total) into the skin once.          Objective:    BP 134/78   Pulse 100   Temp 97.2 F (36.2 C) (Oral)   Ht 5' 6"  (1.676 m)   Wt 186 lb 3.2 oz (84.5 kg)   BMI 30.05 kg/m   Allergies  Allergen Reactions  . Atorvastatin     All statins make him cough    Wt Readings from Last 3 Encounters:  04/25/16 186 lb 3.2 oz (84.5 kg)  01/10/16 183 lb (83 kg)    Physical Exam  Constitutional: He appears well-developed and well-nourished.  HENT:  Head: Normocephalic and atraumatic.  Eyes: Conjunctivae and EOM are normal. Pupils are equal, round, and reactive to light.  Neck: Normal range of motion. Neck supple.  Cardiovascular: Normal rate, regular rhythm and normal heart sounds.   Pulmonary/Chest: Effort normal and breath sounds normal.  Abdominal: Soft. Bowel sounds are normal.  Musculoskeletal: Normal range of motion.  Skin: Skin is warm and dry.    Results for orders  placed or performed in visit on 04/25/16  CMP14+EGFR  Result Value Ref Range   Glucose 156 (H) 65 - 99 mg/dL   BUN 21 8 - 27 mg/dL   Creatinine, Ser 1.83 (H) 0.76 - 1.27 mg/dL   GFR calc non Af Amer 38 (L) >59 mL/min/1.73   GFR calc Af Amer 44 (L) >59 mL/min/1.73   BUN/Creatinine Ratio 11 10 - 24   Sodium 138 134 - 144 mmol/L   Potassium 4.3 3.5 - 5.2 mmol/L   Chloride 97 96 -  106 mmol/L   CO2 26 18 - 29 mmol/L   Calcium 10.1 8.6 - 10.2 mg/dL   Total Protein 7.5 6.0 - 8.5 g/dL   Albumin 4.1 3.6 - 4.8 g/dL   Globulin, Total 3.4 1.5 - 4.5 g/dL   Albumin/Globulin Ratio 1.2 1.2 - 2.2   Bilirubin Total 0.3 0.0 - 1.2 mg/dL   Alkaline Phosphatase 85 39 - 117 IU/L   AST 17 0 - 40 IU/L   ALT 11 0 - 44 IU/L  Thyroid Panel With TSH  Result Value Ref Range   TSH 2.170 0.450 - 4.500 uIU/mL   T4, Total 7.7 4.5 - 12.0 ug/dL   T3 Uptake Ratio 28 24 - 39 %   Free Thyroxine Index 2.2 1.2 - 4.9  Lipid panel  Result Value Ref Range   Cholesterol, Total 191 100 - 199 mg/dL   Triglycerides 292 (H) 0 - 149 mg/dL   HDL 25 (L) >39 mg/dL   VLDL Cholesterol Cal 58 (H) 5 - 40 mg/dL   LDL Calculated 108 (H) 0 - 99 mg/dL   Chol/HDL Ratio 7.6 (H) 0.0 - 5.0 ratio units      Assessment & Plan:   1. Coronary artery disease involving native heart, angina presence unspecified, unspecified vessel or lesion type - carvedilol (COREG) 25 MG tablet; Take 1 tablet (25 mg total) by mouth 2 (two) times daily.  Dispense: 60 tablet; Refill: 11  2. Essential hypertension - prasugrel (EFFIENT) 10 MG TABS tablet; Take 1 tablet (10 mg total) by mouth daily.  Dispense: 30 tablet; Refill: 11 - carvedilol (COREG) 25 MG tablet; Take 1 tablet (25 mg total) by mouth 2 (two) times daily.  Dispense: 60 tablet; Refill: 11 - hydrochlorothiazide (HYDRODIURIL) 25 MG tablet; Take 1 tablet (25 mg total) by mouth 2 (two) times daily.  Dispense: 60 tablet; Refill: 11 - CMP14+EGFR - Thyroid Panel With TSH - Lipid panel  3.  Hyperlipidemia associated with type 2 diabetes mellitus (HCC) - Choline Fenofibrate 135 MG capsule; Take 1 capsule (135 mg total) by mouth daily.  Dispense: 30 capsule; Refill: 11 - pravastatin (PRAVACHOL) 40 MG tablet; Take 1 tablet (40 mg total) by mouth daily.  Dispense: 30 tablet; Refill: 11  4. Type 2 diabetes mellitus with stage 2 chronic kidney disease, without long-term current use of insulin (HCC) - VICTOZA 18 MG/3ML SOPN; Inject 0.3 mLs (1.8 mg total) into the skin once.  Dispense: 6 mL; Refill: 11 - pioglitazone (ACTOS) 45 MG tablet; Take 1 tablet (45 mg total) by mouth daily.  Dispense: 30 tablet; Refill: 11 - CMP14+EGFR - Thyroid Panel With TSH - Lipid panel  5. DDD (degenerative disc disease), lumbar - HYDROcodone-acetaminophen (NORCO) 10-325 MG tablet; Take 1 tablet by mouth every 6 (six) hours as needed.  Dispense: 120 tablet; Refill: 0 - HYDROcodone-acetaminophen (NORCO) 10-325 MG tablet; Take 1 tablet by mouth every 8 (eight) hours as needed.  Dispense: 120 tablet; Refill: 0 - HYDROcodone-acetaminophen (NORCO) 10-325 MG tablet; Take 1 tablet by mouth every 8 (eight) hours as needed.  Dispense: 120 tablet; Refill: 0 - cyclobenzaprine (FLEXERIL) 10 MG tablet; Take 1 tablet (10 mg total) by mouth 3 (three) times daily.  Dispense: 90 tablet; Refill: 5  6. 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  Dispense: 30 tablet; Refill: 11  7. Chronic gout without tophus, unspecified cause, unspecified site - ULORIC 80 MG TABS; Take  1 tablet (80 mg total) by mouth daily.  Dispense: 30 tablet; Refill: 11  8. Fatigue, unspecified type - CMP14+EGFR - Thyroid Panel With TSH   Continue all other maintenance medications as listed above.  Follow up plan: Return in about 3 months (around 07/24/2016) for recheck.  Orders Placed This Encounter  Procedures  . CMP14+EGFR  . Thyroid Panel With TSH  .  Lipid panel    Educational handout given for carb counting   Terald Sleeper PA-C [facility] [address]  [place], [place] [address] [phone]   04/27/2016, 8:23 PM

## 2016-05-08 ENCOUNTER — Telehealth: Payer: Self-pay | Admitting: Physician Assistant

## 2016-05-09 NOTE — Telephone Encounter (Signed)
[place]

## 2016-06-01 ENCOUNTER — Other Ambulatory Visit: Payer: Self-pay | Admitting: *Deleted

## 2016-06-01 DIAGNOSIS — I10 Essential (primary) hypertension: Secondary | ICD-10-CM

## 2016-06-01 MED ORDER — CLOPIDOGREL BISULFATE 75 MG PO TABS: 75.0000 mg | ORAL_TABLET | Freq: Every day | ORAL | 3 refills | Status: DC

## 2016-07-04 ENCOUNTER — Other Ambulatory Visit: Payer: Self-pay | Admitting: Physician Assistant

## 2016-07-04 DIAGNOSIS — M5136 Other intervertebral disc degeneration, lumbar region: Secondary | ICD-10-CM

## 2016-07-04 DIAGNOSIS — I10 Essential (primary) hypertension: Secondary | ICD-10-CM

## 2016-07-04 DIAGNOSIS — N182 Chronic kidney disease, stage 2 (mild): Secondary | ICD-10-CM

## 2016-07-04 DIAGNOSIS — M1A9XX0 Chronic gout, unspecified, without tophus (tophi): Secondary | ICD-10-CM

## 2016-07-04 DIAGNOSIS — E785 Hyperlipidemia, unspecified: Secondary | ICD-10-CM

## 2016-07-04 DIAGNOSIS — E1169 Type 2 diabetes mellitus with other specified complication: Secondary | ICD-10-CM

## 2016-07-04 DIAGNOSIS — E1122 Type 2 diabetes mellitus with diabetic chronic kidney disease: Secondary | ICD-10-CM

## 2016-07-04 DIAGNOSIS — I2510 Atherosclerotic heart disease of native coronary artery without angina pectoris: Secondary | ICD-10-CM

## 2016-07-05 ENCOUNTER — Other Ambulatory Visit: Payer: Self-pay | Admitting: Physician Assistant

## 2016-07-05 DIAGNOSIS — I2510 Atherosclerotic heart disease of native coronary artery without angina pectoris: Secondary | ICD-10-CM

## 2016-07-05 DIAGNOSIS — E785 Hyperlipidemia, unspecified: Principal | ICD-10-CM

## 2016-07-05 DIAGNOSIS — N182 Chronic kidney disease, stage 2 (mild): Secondary | ICD-10-CM

## 2016-07-05 DIAGNOSIS — I10 Essential (primary) hypertension: Secondary | ICD-10-CM

## 2016-07-05 DIAGNOSIS — M1A9XX0 Chronic gout, unspecified, without tophus (tophi): Secondary | ICD-10-CM

## 2016-07-05 DIAGNOSIS — E1169 Type 2 diabetes mellitus with other specified complication: Secondary | ICD-10-CM

## 2016-07-05 DIAGNOSIS — E1122 Type 2 diabetes mellitus with diabetic chronic kidney disease: Secondary | ICD-10-CM

## 2016-08-02 ENCOUNTER — Ambulatory Visit (INDEPENDENT_AMBULATORY_CARE_PROVIDER_SITE_OTHER): Payer: Medicare Other | Admitting: Physician Assistant

## 2016-08-02 ENCOUNTER — Encounter: Payer: Self-pay | Admitting: Physician Assistant

## 2016-08-02 DIAGNOSIS — M1A9XX0 Chronic gout, unspecified, without tophus (tophi): Secondary | ICD-10-CM

## 2016-08-02 DIAGNOSIS — E785 Hyperlipidemia, unspecified: Secondary | ICD-10-CM

## 2016-08-02 DIAGNOSIS — E1122 Type 2 diabetes mellitus with diabetic chronic kidney disease: Secondary | ICD-10-CM

## 2016-08-02 DIAGNOSIS — I10 Essential (primary) hypertension: Secondary | ICD-10-CM

## 2016-08-02 DIAGNOSIS — N182 Chronic kidney disease, stage 2 (mild): Secondary | ICD-10-CM | POA: Diagnosis not present

## 2016-08-02 DIAGNOSIS — M5136 Other intervertebral disc degeneration, lumbar region: Secondary | ICD-10-CM

## 2016-08-02 DIAGNOSIS — E1169 Type 2 diabetes mellitus with other specified complication: Secondary | ICD-10-CM | POA: Diagnosis not present

## 2016-08-02 DIAGNOSIS — I2510 Atherosclerotic heart disease of native coronary artery without angina pectoris: Secondary | ICD-10-CM

## 2016-08-02 LAB — BAYER DCA HB A1C WAIVED: HB A1C (BAYER DCA - WAIVED): 6.3 % (ref <7.0)

## 2016-08-02 MED ORDER — LIRAGLUTIDE 18 MG/3ML ~~LOC~~ SOPN: PEN_INJECTOR | SUBCUTANEOUS | 3 refills | Status: DC

## 2016-08-02 MED ORDER — CYCLOBENZAPRINE HCL 10 MG PO TABS: 10.0000 mg | ORAL_TABLET | Freq: Three times a day (TID) | ORAL | 1 refills | Status: DC | PRN

## 2016-08-02 MED ORDER — HYDROCHLOROTHIAZIDE 25 MG PO TABS: 25.0000 mg | ORAL_TABLET | Freq: Two times a day (BID) | ORAL | 3 refills | Status: DC

## 2016-08-02 MED ORDER — HYDROCODONE-ACETAMINOPHEN 10-325 MG PO TABS: 1.0000 | ORAL_TABLET | Freq: Three times a day (TID) | ORAL | 0 refills | Status: DC | PRN

## 2016-08-02 MED ORDER — ALLOPURINOL 100 MG PO TABS: 100.0000 mg | ORAL_TABLET | Freq: Three times a day (TID) | ORAL | 3 refills | Status: DC

## 2016-08-02 MED ORDER — CARVEDILOL 25 MG PO TABS: 25.0000 mg | ORAL_TABLET | Freq: Two times a day (BID) | ORAL | 3 refills | Status: DC

## 2016-08-02 MED ORDER — HYDROCODONE-ACETAMINOPHEN 10-325 MG PO TABS: 1.0000 | ORAL_TABLET | Freq: Four times a day (QID) | ORAL | 0 refills | Status: DC | PRN

## 2016-08-02 MED ORDER — FEBUXOSTAT 80 MG PO TABS: ORAL_TABLET | ORAL | 3 refills | Status: DC

## 2016-08-02 MED ORDER — PRAVASTATIN SODIUM 40 MG PO TABS: ORAL_TABLET | ORAL | 3 refills | Status: DC

## 2016-08-02 MED ORDER — LYRICA 200 MG PO CAPS: 200.0000 mg | ORAL_CAPSULE | Freq: Two times a day (BID) | ORAL | 5 refills | Status: DC

## 2016-08-02 MED ORDER — PIOGLITAZONE HCL 45 MG PO TABS: ORAL_TABLET | ORAL | 3 refills | Status: DC

## 2016-08-02 NOTE — Patient Instructions (Signed)

## 2016-08-03 LAB — CMP14+EGFR
ALBUMIN: 4 g/dL (ref 3.6–4.8)
ALT: 9 IU/L (ref 0–44)
AST: 18 IU/L (ref 0–40)
Albumin/Globulin Ratio: 1.1 — ABNORMAL LOW (ref 1.2–2.2)
Alkaline Phosphatase: 70 IU/L (ref 39–117)
BUN / CREAT RATIO: 12 (ref 10–24)
BUN: 22 mg/dL (ref 8–27)
Bilirubin Total: 0.3 mg/dL (ref 0.0–1.2)
CALCIUM: 10.1 mg/dL (ref 8.6–10.2)
CO2: 23 mmol/L (ref 18–29)
CREATININE: 1.9 mg/dL — AB (ref 0.76–1.27)
Chloride: 99 mmol/L (ref 96–106)
GFR calc Af Amer: 42 mL/min/{1.73_m2} — ABNORMAL LOW (ref >59)
GFR, EST NON AFRICAN AMERICAN: 36 mL/min/{1.73_m2} — AB (ref >59)
GLOBULIN, TOTAL: 3.5 g/dL (ref 1.5–4.5)
GLUCOSE: 109 mg/dL — AB (ref 65–99)
Potassium: 4.6 mmol/L (ref 3.5–5.2)
SODIUM: 138 mmol/L (ref 134–144)
Total Protein: 7.5 g/dL (ref 6.0–8.5)

## 2016-08-03 LAB — LIPID PANEL
CHOLESTEROL TOTAL: 169 mg/dL (ref 100–199)
Chol/HDL Ratio: 5.8 ratio — ABNORMAL HIGH (ref 0.0–5.0)
HDL: 29 mg/dL — ABNORMAL LOW (ref >39)
LDL CALC: 106 mg/dL — AB (ref 0–99)
TRIGLYCERIDES: 169 mg/dL — AB (ref 0–149)
VLDL CHOLESTEROL CAL: 34 mg/dL (ref 5–40)

## 2016-08-03 NOTE — Progress Notes (Signed)
BP 135/84   Pulse 95   Temp 97.2 F (36.2 C) (Oral)   Ht 5' 6"  (1.676 m)   Wt 185 lb (83.9 kg)   BMI 29.86 kg/m    Subjective:    Patient ID: Tommy Douglas, male    DOB: 1950/06/12, 66 y.o.   MRN: [id number]  HPI: Tommy Douglas is a 66 y.o. male presenting on 08/02/2016 for Hypertension (pt here today for routine follow up of his chronic medical conditions)  This patient comes in for periodic recheck on medications and conditions including CKD, diabetes, gout, CAD,  Has nephrology appointment soon.  He has had some congestion and dizziness with positional change. Admits to some seasonal allergies that have his head quite full.   All medications are reviewed today. There are no reports of any problems with the medications. All of the medical conditions are reviewed and updated.  Lab work is reviewed and will be ordered as medically necessary. There are no new problems reported with today's visit.   Relevant past medical, surgical, family and social history reviewed and updated as indicated. Allergies and medications reviewed and updated.  Past Medical History:  Diagnosis Date  . Allergy   . Anxiety   . Diabetes mellitus without complication ([place])   . Heart disease   . Hyperlipidemia   . Hypertension     Past Surgical History:  Procedure Laterality Date  . CHOLECYSTECTOMY    . HEART STENT      Review of Systems  Constitutional: Negative.  Negative for appetite change and fatigue.  HENT: Negative.   Eyes: Negative.  Negative for pain and visual disturbance.  Respiratory: Negative.  Negative for cough, chest tightness, shortness of breath and wheezing.   Cardiovascular: Negative.  Negative for chest pain, palpitations and leg swelling.  Gastrointestinal: Negative.  Negative for abdominal pain, diarrhea, nausea and vomiting.  Endocrine: Negative.   Genitourinary: Negative.   Musculoskeletal: Negative.   Skin: Negative.  Negative for color change and rash.    Neurological: Negative.  Negative for weakness, numbness and headaches.  Psychiatric/Behavioral: Negative.     Allergies as of 08/02/2016      Reactions   Atorvastatin    All statins make him cough      Medication List       Accurate as of 08/02/16 11:59 PM. Always use your most recent med list.          allopurinol 100 MG tablet Commonly known as:  ZYLOPRIM Take 1 tablet (100 mg total) by mouth 3 (three) times daily.   ALPRAZolam 0.5 MG tablet Commonly known as:  XANAX Take 1 tablet (0.5 mg total) by mouth 2 (two) times daily as needed.   carvedilol 25 MG tablet Commonly known as:  COREG Take 1 tablet (25 mg total) by mouth 2 (two) times daily.   cetirizine 10 MG tablet Commonly known as:  ZYRTEC Take 1 tablet (10 mg total) by mouth daily.   clopidogrel 75 MG tablet Commonly known as:  PLAVIX Take 1 tablet (75 mg total) by mouth daily. D/C effient   cyclobenzaprine 10 MG tablet Commonly known as:  FLEXERIL Take 1 tablet (10 mg total) by mouth 3 (three) times daily as needed for muscle spasms.   famotidine 20 MG tablet Commonly known as:  PEPCID Take 1 tablet (20 mg total) by mouth daily.   Febuxostat 80 MG Tabs Commonly known as:  ULORIC TAKE ONE (1) TABLET EACH DAY   hydrochlorothiazide 25  MG tablet Commonly known as:  HYDRODIURIL Take 1 tablet (25 mg total) by mouth 2 (two) times daily.   HYDROcodone-acetaminophen 10-325 MG tablet Commonly known as:  NORCO Take 1 tablet by mouth every 6 (six) hours as needed.   HYDROcodone-acetaminophen 10-325 MG tablet Commonly known as:  NORCO Take 1 tablet by mouth every 8 (eight) hours as needed.   HYDROcodone-acetaminophen 10-325 MG tablet Commonly known as:  NORCO Take 1 tablet by mouth every 8 (eight) hours as needed.   liraglutide 18 MG/3ML Sopn Commonly known as:  VICTOZA INJECT 0.3ML SQ DAILY   LYRICA 200 MG capsule Generic drug:  pregabalin Take 1 capsule (200 mg total) by mouth 2 (two) times  daily.   pioglitazone 45 MG tablet Commonly known as:  ACTOS TAKE ONE (1) TABLET EACH DAY   pravastatin 40 MG tablet Commonly known as:  PRAVACHOL TAKE ONE (1) TABLET EACH DAY   predniSONE 10 MG tablet Commonly known as:  DELTASONE Take 10 mg by mouth daily.   VENTOLIN HFA 108 (90 Base) MCG/ACT inhaler Generic drug:  albuterol Inhale 2 puffs into the lungs every 6 (six) hours as needed for wheezing or shortness of breath.          Objective:    BP 135/84   Pulse 95   Temp 97.2 F (36.2 C) (Oral)   Ht 5' 6"  (1.676 m)   Wt 185 lb (83.9 kg)   BMI 29.86 kg/m   Allergies  Allergen Reactions  . Atorvastatin     All statins make him cough    Physical Exam  Constitutional: He appears well-developed and well-nourished.  HENT:  Head: Normocephalic and atraumatic.  Eyes: Conjunctivae and EOM are normal. Pupils are equal, round, and reactive to light.  Neck: Normal range of motion. Neck supple.  Cardiovascular: Normal rate, regular rhythm and normal heart sounds.   Pulmonary/Chest: Effort normal and breath sounds normal.  Abdominal: Soft. Bowel sounds are normal.  Musculoskeletal: Normal range of motion.  Skin: Skin is warm and dry.    Results for orders placed or performed in visit on 08/02/16  CMP14+EGFR  Result Value Ref Range   Glucose 109 (H) 65 - 99 mg/dL   BUN 22 8 - 27 mg/dL   Creatinine, Ser 1.90 (H) 0.76 - 1.27 mg/dL   GFR calc non Af Amer 36 (L) >59 mL/min/1.73   GFR calc Af Amer 42 (L) >59 mL/min/1.73   BUN/Creatinine Ratio 12 10 - 24   Sodium 138 134 - 144 mmol/L   Potassium 4.6 3.5 - 5.2 mmol/L   Chloride 99 96 - 106 mmol/L   CO2 23 18 - 29 mmol/L   Calcium 10.1 8.6 - 10.2 mg/dL   Total Protein 7.5 6.0 - 8.5 g/dL   Albumin 4.0 3.6 - 4.8 g/dL   Globulin, Total 3.5 1.5 - 4.5 g/dL   Albumin/Globulin Ratio 1.1 (L) 1.2 - 2.2   Bilirubin Total 0.3 0.0 - 1.2 mg/dL   Alkaline Phosphatase 70 39 - 117 IU/L   AST 18 0 - 40 IU/L   ALT 9 0 - 44 IU/L   Lipid panel  Result Value Ref Range   Cholesterol, Total 169 100 - 199 mg/dL   Triglycerides 169 (H) 0 - 149 mg/dL   HDL 29 (L) >39 mg/dL   VLDL Cholesterol Cal 34 5 - 40 mg/dL   LDL Calculated 106 (H) 0 - 99 mg/dL   Chol/HDL Ratio 5.8 (H) 0.0 - 5.0 ratio  Bayer DCA Hb A1c Waived  Result Value Ref Range   Bayer DCA Hb A1c Waived 6.3 <7.0 %      Assessment & Plan:   1. DDD (degenerative disc disease), lumbar - HYDROcodone-acetaminophen (NORCO) 10-325 MG tablet; Take 1 tablet by mouth every 6 (six) hours as needed.  Dispense: 120 tablet; Refill: 0 - HYDROcodone-acetaminophen (NORCO) 10-325 MG tablet; Take 1 tablet by mouth every 8 (eight) hours as needed.  Dispense: 120 tablet; Refill: 0 - HYDROcodone-acetaminophen (NORCO) 10-325 MG tablet; Take 1 tablet by mouth every 8 (eight) hours as needed.  Dispense: 120 tablet; Refill: 0 - cyclobenzaprine (FLEXERIL) 10 MG tablet; Take 1 tablet (10 mg total) by mouth 3 (three) times daily as needed for muscle spasms.  Dispense: 270 tablet; Refill: 1 - LYRICA 200 MG capsule; Take 1 capsule (200 mg total) by mouth 2 (two) times daily.  Dispense: 60 capsule; Refill: 5  2. Hyperlipidemia associated with type 2 diabetes mellitus (HCC) - pravastatin (PRAVACHOL) 40 MG tablet; TAKE ONE (1) TABLET EACH DAY  Dispense: 90 tablet; Refill: 3 - Lipid panel  3. Type 2 diabetes mellitus with stage 2 chronic kidney disease, without long-term current use of insulin (HCC) - pioglitazone (ACTOS) 45 MG tablet; TAKE ONE (1) TABLET EACH DAY  Dispense: 90 tablet; Refill: 3 - liraglutide (VICTOZA) 18 MG/3ML SOPN; INJECT 0.3ML SQ DAILY  Dispense: 27 mL; Refill: 3 - CMP14+EGFR - Lipid panel - Bayer DCA Hb A1c Waived  4. Essential hypertension - carvedilol (COREG) 25 MG tablet; Take 1 tablet (25 mg total) by mouth 2 (two) times daily.  Dispense: 180 tablet; Refill: 3 - hydrochlorothiazide (HYDRODIURIL) 25 MG tablet; Take 1 tablet (25 mg total) by mouth 2 (two) times  daily.  Dispense: 180 tablet; Refill: 3 - CMP14+EGFR - Bayer DCA Hb A1c Waived  5. Coronary artery disease involving native heart, angina presence unspecified, unspecified vessel or lesion type - carvedilol (COREG) 25 MG tablet; Take 1 tablet (25 mg total) by mouth 2 (two) times daily.  Dispense: 180 tablet; Refill: 3 - Lipid panel  6. Chronic gout without tophus, unspecified cause, unspecified site - allopurinol (ZYLOPRIM) 100 MG tablet; Take 1 tablet (100 mg total) by mouth 3 (three) times daily.  Dispense: 270 tablet; Refill: 3 - Febuxostat (ULORIC) 80 MG TABS; TAKE ONE (1) TABLET EACH DAY  Dispense: 90 each; Refill: 3   Current Outpatient Prescriptions:  .  allopurinol (ZYLOPRIM) 100 MG tablet, Take 1 tablet (100 mg total) by mouth 3 (three) times daily., Disp: 270 tablet, Rfl: 3 .  ALPRAZolam (XANAX) 0.5 MG tablet, Take 1 tablet (0.5 mg total) by mouth 2 (two) times daily as needed., Disp: 60 tablet, Rfl: 5 .  carvedilol (COREG) 25 MG tablet, Take 1 tablet (25 mg total) by mouth 2 (two) times daily., Disp: 180 tablet, Rfl: 3 .  cetirizine (ZYRTEC) 10 MG tablet, Take 1 tablet (10 mg total) by mouth daily., Disp: 30 tablet, Rfl: 11 .  clopidogrel (PLAVIX) 75 MG tablet, Take 1 tablet (75 mg total) by mouth daily. D/C effient, Disp: 90 tablet, Rfl: 3 .  cyclobenzaprine (FLEXERIL) 10 MG tablet, Take 1 tablet (10 mg total) by mouth 3 (three) times daily as needed for muscle spasms., Disp: 270 tablet, Rfl: 1 .  famotidine (PEPCID) 20 MG tablet, Take 1 tablet (20 mg total) by mouth daily., Disp: 30 tablet, Rfl: 11 .  Febuxostat (ULORIC) 80 MG TABS, TAKE ONE (1) TABLET EACH DAY, Disp: 90 each, Rfl: 3 .  hydrochlorothiazide (HYDRODIURIL) 25 MG tablet, Take 1 tablet (25 mg total) by mouth 2 (two) times daily., Disp: 180 tablet, Rfl: 3 .  HYDROcodone-acetaminophen (NORCO) 10-325 MG tablet, Take 1 tablet by mouth every 6 (six) hours as needed., Disp: 120 tablet, Rfl: 0 .  HYDROcodone-acetaminophen  (NORCO) 10-325 MG tablet, Take 1 tablet by mouth every 8 (eight) hours as needed., Disp: 120 tablet, Rfl: 0 .  HYDROcodone-acetaminophen (NORCO) 10-325 MG tablet, Take 1 tablet by mouth every 8 (eight) hours as needed., Disp: 120 tablet, Rfl: 0 .  liraglutide (VICTOZA) 18 MG/3ML SOPN, INJECT 0.3ML SQ DAILY, Disp: 27 mL, Rfl: 3 .  LYRICA 200 MG capsule, Take 1 capsule (200 mg total) by mouth 2 (two) times daily., Disp: 60 capsule, Rfl: 5 .  pioglitazone (ACTOS) 45 MG tablet, TAKE ONE (1) TABLET EACH DAY, Disp: 90 tablet, Rfl: 3 .  pravastatin (PRAVACHOL) 40 MG tablet, TAKE ONE (1) TABLET EACH DAY, Disp: 90 tablet, Rfl: 3 .  predniSONE (DELTASONE) 10 MG tablet, Take 10 mg by mouth daily., Disp: , Rfl:  .  VENTOLIN HFA 108 (90 Base) MCG/ACT inhaler, Inhale 2 puffs into the lungs every 6 (six) hours as needed for wheezing or shortness of breath., Disp: 18 g, Rfl: 6  Continue all other maintenance medications as listed above.  Follow up plan: Return in about 3 months (around 11/02/2016) for recheck.  Educational handout given for vertigo  Terald Sleeper PA-C [facility] [address]  [place], [place] [address] [phone]   08/03/2016, 10:34 PM

## 2016-10-28 ENCOUNTER — Other Ambulatory Visit: Payer: Self-pay | Admitting: Physician Assistant

## 2016-10-28 DIAGNOSIS — M5136 Other intervertebral disc degeneration, lumbar region: Secondary | ICD-10-CM

## 2016-11-03 ENCOUNTER — Ambulatory Visit (INDEPENDENT_AMBULATORY_CARE_PROVIDER_SITE_OTHER): Payer: Medicare Other | Admitting: Physician Assistant

## 2016-11-03 ENCOUNTER — Encounter: Payer: Self-pay | Admitting: Physician Assistant

## 2016-11-03 VITALS — BP 126/63 | HR 89 | Temp 98.2°F | Ht 66.0 in | Wt 186.4 lb

## 2016-11-03 DIAGNOSIS — E785 Hyperlipidemia, unspecified: Secondary | ICD-10-CM | POA: Diagnosis not present

## 2016-11-03 DIAGNOSIS — I2510 Atherosclerotic heart disease of native coronary artery without angina pectoris: Secondary | ICD-10-CM

## 2016-11-03 DIAGNOSIS — M5136 Other intervertebral disc degeneration, lumbar region: Secondary | ICD-10-CM | POA: Diagnosis not present

## 2016-11-03 DIAGNOSIS — I25110 Atherosclerotic heart disease of native coronary artery with unstable angina pectoris: Secondary | ICD-10-CM

## 2016-11-03 DIAGNOSIS — N182 Chronic kidney disease, stage 2 (mild): Secondary | ICD-10-CM

## 2016-11-03 DIAGNOSIS — D649 Anemia, unspecified: Secondary | ICD-10-CM | POA: Diagnosis not present

## 2016-11-03 DIAGNOSIS — E1122 Type 2 diabetes mellitus with diabetic chronic kidney disease: Secondary | ICD-10-CM | POA: Diagnosis not present

## 2016-11-03 DIAGNOSIS — E1169 Type 2 diabetes mellitus with other specified complication: Secondary | ICD-10-CM | POA: Diagnosis not present

## 2016-11-03 DIAGNOSIS — I10 Essential (primary) hypertension: Secondary | ICD-10-CM

## 2016-11-03 LAB — BAYER DCA HB A1C WAIVED: HB A1C: 5.7 % (ref <7.0)

## 2016-11-03 MED ORDER — HYDROCODONE-ACETAMINOPHEN 10-325 MG PO TABS: 1.0000 | ORAL_TABLET | Freq: Three times a day (TID) | ORAL | 0 refills | Status: DC | PRN

## 2016-11-03 MED ORDER — HYDROCODONE-ACETAMINOPHEN 10-325 MG PO TABS: 1.0000 | ORAL_TABLET | Freq: Four times a day (QID) | ORAL | 0 refills | Status: DC | PRN

## 2016-11-03 NOTE — Patient Instructions (Signed)
In a few days you may receive a survey in the mail or online from [employer] regarding your visit with us today. Please take a moment to fill this out. Your feedback is very important to our whole office. It can help us better understand your needs as well as improve your experience and satisfaction. Thank you for taking your time to complete it. We care about you.  Avanni Turnbaugh, PA-C  

## 2016-11-04 LAB — CBC WITH DIFFERENTIAL/PLATELET
BASOS: 1 %
Basophils Absolute: 0 10*3/uL (ref 0.0–0.2)
EOS (ABSOLUTE): 0.5 10*3/uL — AB (ref 0.0–0.4)
Eos: 7 %
Hematocrit: 36.8 % — ABNORMAL LOW (ref 37.5–51.0)
Hemoglobin: 11.3 g/dL — ABNORMAL LOW (ref 13.0–17.7)
Immature Grans (Abs): 0 10*3/uL (ref 0.0–0.1)
Immature Granulocytes: 0 %
Lymphocytes Absolute: 2.5 10*3/uL (ref 0.7–3.1)
Lymphs: 35 %
MCH: 28.3 pg (ref 26.6–33.0)
MCHC: 30.7 g/dL — ABNORMAL LOW (ref 31.5–35.7)
MCV: 92 fL (ref 79–97)
MONOS ABS: 0.5 10*3/uL (ref 0.1–0.9)
Monocytes: 7 %
NEUTROS ABS: 3.7 10*3/uL (ref 1.4–7.0)
Neutrophils: 50 %
PLATELETS: 248 10*3/uL (ref 150–379)
RBC: 4 x10E6/uL — ABNORMAL LOW (ref 4.14–5.80)
RDW: 15.2 % (ref 12.3–15.4)
WBC: 7.2 10*3/uL (ref 3.4–10.8)

## 2016-11-04 LAB — CMP14+EGFR
A/G RATIO: 1.3 (ref 1.2–2.2)
ALBUMIN: 3.9 g/dL (ref 3.6–4.8)
ALT: 6 IU/L (ref 0–44)
AST: 16 IU/L (ref 0–40)
Alkaline Phosphatase: 86 IU/L (ref 39–117)
BILIRUBIN TOTAL: 0.3 mg/dL (ref 0.0–1.2)
BUN / CREAT RATIO: 10 (ref 10–24)
BUN: 18 mg/dL (ref 8–27)
CALCIUM: 9.4 mg/dL (ref 8.6–10.2)
CHLORIDE: 103 mmol/L (ref 96–106)
CO2: 23 mmol/L (ref 20–29)
Creatinine, Ser: 1.81 mg/dL — ABNORMAL HIGH (ref 0.76–1.27)
GFR, EST AFRICAN AMERICAN: 44 mL/min/{1.73_m2} — AB (ref >59)
GFR, EST NON AFRICAN AMERICAN: 38 mL/min/{1.73_m2} — AB (ref >59)
GLUCOSE: 150 mg/dL — AB (ref 65–99)
Globulin, Total: 3 g/dL (ref 1.5–4.5)
Potassium: 4.5 mmol/L (ref 3.5–5.2)
Sodium: 139 mmol/L (ref 134–144)
TOTAL PROTEIN: 6.9 g/dL (ref 6.0–8.5)

## 2016-11-04 LAB — LIPID PANEL
CHOL/HDL RATIO: 6.5 ratio — AB (ref 0.0–5.0)
Cholesterol, Total: 155 mg/dL (ref 100–199)
HDL: 24 mg/dL — AB (ref >39)
LDL Calculated: 82 mg/dL (ref 0–99)
Triglycerides: 246 mg/dL — ABNORMAL HIGH (ref 0–149)
VLDL CHOLESTEROL CAL: 49 mg/dL — AB (ref 5–40)

## 2016-11-05 NOTE — Progress Notes (Signed)
BP 126/63   Pulse 89   Temp 98.2 F (36.8 C) (Oral)   Ht _0  (1.676 m)   Wt 186 lb 6.4 oz (84.6 kg)   BMI 30.09 kg/m    Subjective:    Patient ID: Tommy Douglas, male    DOB: [birth year], 66 y.o.   MRN: [id number]  HPI: Tommy Douglas is a 66 y.o. male presenting on 11/03/2016 for Follow-up (3 month )  This patient comes in for periodic recheck on medications and conditions including CAD, HTN, type 2 diabetes, CKD 2, hyperlipid, DDD. He has no issues at this time. He needs meds refilled today.    All medications are reviewed today. There are no reports of any problems with the medications. All of the medical conditions are reviewed and updated.  Lab work is reviewed and will be ordered as medically necessary. There are no new problems reported with today's visit.   Relevant past medical, surgical, family and social history reviewed and updated as indicated. Allergies and medications reviewed and updated.  Past Medical History:  Diagnosis Date  . Allergy   . Anxiety   . Diabetes mellitus without complication ([place])   . Heart disease   . Hyperlipidemia   . Hypertension     Past Surgical History:  Procedure Laterality Date  . CHOLECYSTECTOMY    . HEART STENT      Review of Systems  Constitutional: Negative.  Negative for appetite change and fatigue.  HENT: Negative.   Eyes: Negative.  Negative for pain and visual disturbance.  Respiratory: Negative.  Negative for cough, chest tightness, shortness of breath and wheezing.   Cardiovascular: Negative.  Negative for chest pain, palpitations and leg swelling.  Gastrointestinal: Negative.  Negative for abdominal pain, diarrhea, nausea and vomiting.  Endocrine: Negative.   Genitourinary: Negative.   Musculoskeletal: Positive for arthralgias, back pain and joint swelling.  Skin: Negative.  Negative for color change and rash.  Neurological: Negative.  Negative for weakness, numbness and headaches.  Psychiatric/Behavioral:  Negative.     Allergies as of 11/03/2016      Reactions   Atorvastatin    All statins make him cough      Medication List       Accurate as of 11/03/16 11:59 PM. Always use your most recent med list.          allopurinol 100 MG tablet Commonly known as:  ZYLOPRIM Take 1 tablet (100 mg total) by mouth 3 (three) times daily.   ALPRAZolam 0.5 MG tablet Commonly known as:  XANAX Take 1 tablet (0.5 mg total) by mouth 2 (two) times daily as needed.   carvedilol 25 MG tablet Commonly known as:  COREG Take 1 tablet (25 mg total) by mouth 2 (two) times daily.   cetirizine 10 MG tablet Commonly known as:  ZYRTEC Take 1 tablet (10 mg total) by mouth daily.   clopidogrel 75 MG tablet Commonly known as:  PLAVIX Take 1 tablet (75 mg total) by mouth daily. D/C effient   cyclobenzaprine 10 MG tablet Commonly known as:  FLEXERIL Take 1 tablet (10 mg total) by mouth 3 (three) times daily as needed for muscle spasms.   famotidine 20 MG tablet Commonly known as:  PEPCID Take 1 tablet (20 mg total) by mouth daily.   Febuxostat 80 MG Tabs Commonly known as:  ULORIC TAKE ONE (1) TABLET EACH DAY   hydrochlorothiazide 25 MG tablet Commonly known as:  HYDRODIURIL Take 1 tablet (25  mg total) by mouth 2 (two) times daily.   HYDROcodone-acetaminophen 10-325 MG tablet Commonly known as:  NORCO Take 1 tablet by mouth every 8 (eight) hours as needed.   HYDROcodone-acetaminophen 10-325 MG tablet Commonly known as:  NORCO Take 1 tablet by mouth every 6 (six) hours as needed.   HYDROcodone-acetaminophen 10-325 MG tablet Commonly known as:  NORCO Take 1 tablet by mouth every 8 (eight) hours as needed.   liraglutide 18 MG/3ML Sopn Commonly known as:  VICTOZA INJECT 0.3ML SQ DAILY   LYRICA 200 MG capsule Generic drug:  pregabalin Take 1 capsule (200 mg total) by mouth 2 (two) times daily.   pioglitazone 45 MG tablet Commonly known as:  ACTOS TAKE ONE (1) TABLET EACH DAY     pravastatin 40 MG tablet Commonly known as:  PRAVACHOL TAKE ONE (1) TABLET EACH DAY   predniSONE 10 MG tablet Commonly known as:  DELTASONE Take 10 mg by mouth daily.   VENTOLIN HFA 108 (90 Base) MCG/ACT inhaler Generic drug:  albuterol Inhale 2 puffs into the lungs every 6 (six) hours as needed for wheezing or shortness of breath.          Objective:    BP 126/63   Pulse 89   Temp 98.2 F (36.8 C) (Oral)   Ht _0  (1.676 m)   Wt 186 lb 6.4 oz (84.6 kg)   BMI 30.09 kg/m   Allergies  Allergen Reactions  . Atorvastatin     All statins make him cough    Physical Exam  Constitutional: He appears well-developed and well-nourished.  HENT:  Head: Normocephalic and atraumatic.  Eyes: Pupils are equal, round, and reactive to light. Conjunctivae and EOM are normal.  Neck: Normal range of motion. Neck supple.  Cardiovascular: Normal rate, regular rhythm and normal heart sounds.   Pulmonary/Chest: Effort normal and breath sounds normal.  Abdominal: Soft. Bowel sounds are normal.  Musculoskeletal: Normal range of motion.  Skin: Skin is warm and dry.    Results for orders placed or performed in visit on 11/03/16  CBC with Differential/Platelet  Result Value Ref Range   WBC 7.2 3.4 - 10.8 x10E3/uL   RBC 4.00 (L) 4.14 - 5.80 x10E6/uL   Hemoglobin 11.3 (L) 13.0 - 17.7 g/dL   Hematocrit 36.8 (L) 37.5 - 51.0 %   MCV 92 79 - 97 fL   MCH 28.3 26.6 - 33.0 pg   MCHC 30.7 (L) 31.5 - 35.7 g/dL   RDW 15.2 12.3 - 15.4 %   Platelets 248 150 - 379 x10E3/uL   Neutrophils 50 Not Estab. %   Lymphs 35 Not Estab. %   Monocytes 7 Not Estab. %   Eos 7 Not Estab. %   Basos 1 Not Estab. %   Neutrophils Absolute 3.7 1.4 - 7.0 x10E3/uL   Lymphocytes Absolute 2.5 0.7 - 3.1 x10E3/uL   Monocytes Absolute 0.5 0.1 - 0.9 x10E3/uL   EOS (ABSOLUTE) 0.5 (H) 0.0 - 0.4 x10E3/uL   Basophils Absolute 0.0 0.0 - 0.2 x10E3/uL   Immature Granulocytes 0 Not Estab. %   Immature Grans (Abs) 0.0 0.0 - 0.1  x10E3/uL  CMP14+EGFR  Result Value Ref Range   Glucose 150 (H) 65 - 99 mg/dL   BUN 18 8 - 27 mg/dL   Creatinine, Ser 1.81 (H) 0.76 - 1.27 mg/dL   GFR calc non Af Amer 38 (L) >59 mL/min/1.73   GFR calc Af Amer 44 (L) >59 mL/min/1.73   BUN/Creatinine Ratio 10  10 - 24   Sodium 139 134 - 144 mmol/L   Potassium 4.5 3.5 - 5.2 mmol/L   Chloride 103 96 - 106 mmol/L   CO2 23 20 - 29 mmol/L   Calcium 9.4 8.6 - 10.2 mg/dL   Total Protein 6.9 6.0 - 8.5 g/dL   Albumin 3.9 3.6 - 4.8 g/dL   Globulin, Total 3.0 1.5 - 4.5 g/dL   Albumin/Globulin Ratio 1.3 1.2 - 2.2   Bilirubin Total 0.3 0.0 - 1.2 mg/dL   Alkaline Phosphatase 86 39 - 117 IU/L   AST 16 0 - 40 IU/L   ALT 6 0 - 44 IU/L  Lipid panel  Result Value Ref Range   Cholesterol, Total 155 100 - 199 mg/dL   Triglycerides 246 (H) 0 - 149 mg/dL   HDL 24 (L) >39 mg/dL   VLDL Cholesterol Cal 49 (H) 5 - 40 mg/dL   LDL Calculated 82 0 - 99 mg/dL   Chol/HDL Ratio 6.5 (H) 0.0 - 5.0 ratio  Bayer DCA Hb A1c Waived  Result Value Ref Range   Bayer DCA Hb A1c Waived 5.7 <7.0 %      Assessment & Plan:   1. 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 Essential hypertension - CBC with Differential/Platelet - CMP14+EGFR - Lipid panel - Bayer DCA Hb A1c Waived  3. Coronary artery disease involving native heart, angina presence unspecified, unspecified vessel or lesion type  4. Type 2 diabetes mellitus with stage 2 chronic kidney disease, without long-term current use of insulin (HCC) - Bayer DCA Hb A1c Waived  5. Hyperlipidemia associated with type 2 diabetes mellitus ([place])  6. DDD (degenerative disc disease), lumbar - HYDROcodone-acetaminophen (NORCO) 10-325 MG tablet; Take 1 tablet by mouth every 8 (eight) hours as needed.  Dispense: 120 tablet; Refill: 0 - HYDROcodone-acetaminophen (NORCO) 10-325 MG tablet; Take 1 tablet by mouth every 6 (six) hours as needed.   Dispense: 120 tablet; Refill: 0 - HYDROcodone-acetaminophen (NORCO) 10-325 MG tablet; Take 1 tablet by mouth every 8 (eight) hours as needed.  Dispense: 120 tablet; Refill: 0    Current Outpatient Prescriptions:  .  allopurinol (ZYLOPRIM) 100 MG tablet, Take 1 tablet (100 mg total) by mouth 3 (three) times daily., Disp: 270 tablet, Rfl: 3 .  ALPRAZolam (XANAX) 0.5 MG tablet, Take 1 tablet (0.5 mg total) by mouth 2 (two) times daily as needed., Disp: 60 tablet, Rfl: 5 .  carvedilol (COREG) 25 MG tablet, Take 1 tablet (25 mg total) by mouth 2 (two) times daily., Disp: 180 tablet, Rfl: 3 .  cetirizine (ZYRTEC) 10 MG tablet, Take 1 tablet (10 mg total) by mouth daily., Disp: 30 tablet, Rfl: 11 .  clopidogrel (PLAVIX) 75 MG tablet, Take 1 tablet (75 mg total) by mouth daily. D/C effient, Disp: 90 tablet, Rfl: 3 .  cyclobenzaprine (FLEXERIL) 10 MG tablet, Take 1 tablet (10 mg total) by mouth 3 (three) times daily as needed for muscle spasms., Disp: 270 tablet, Rfl: 1 .  famotidine (PEPCID) 20 MG tablet, Take 1 tablet (20 mg total) by mouth daily., Disp: 30 tablet, Rfl: 11 .  Febuxostat (ULORIC) 80 MG TABS, TAKE ONE (1) TABLET EACH DAY, Disp: 90 each, Rfl: 3 .  hydrochlorothiazide (HYDRODIURIL) 25 MG tablet, Take 1 tablet (25 mg total) by mouth 2 (two) times daily., Disp: 180 tablet, Rfl: 3 .  HYDROcodone-acetaminophen (NORCO) 10-325 MG tablet, Take 1 tablet by mouth every 8 (  eight) hours as needed., Disp: 120 tablet, Rfl: 0 .  HYDROcodone-acetaminophen (NORCO) 10-325 MG tablet, Take 1 tablet by mouth every 6 (six) hours as needed., Disp: 120 tablet, Rfl: 0 .  HYDROcodone-acetaminophen (NORCO) 10-325 MG tablet, Take 1 tablet by mouth every 8 (eight) hours as needed., Disp: 120 tablet, Rfl: 0 .  liraglutide (VICTOZA) 18 MG/3ML SOPN, INJECT 0.3ML SQ DAILY, Disp: 27 mL, Rfl: 3 .  LYRICA 200 MG capsule, Take 1 capsule (200 mg total) by mouth 2 (two) times daily., Disp: 60 capsule, Rfl: 5 .  pioglitazone  (ACTOS) 45 MG tablet, TAKE ONE (1) TABLET EACH DAY, Disp: 90 tablet, Rfl: 3 .  pravastatin (PRAVACHOL) 40 MG tablet, TAKE ONE (1) TABLET EACH DAY, Disp: 90 tablet, Rfl: 3 .  predniSONE (DELTASONE) 10 MG tablet, Take 10 mg by mouth daily., Disp: , Rfl:  .  VENTOLIN HFA 108 (90 Base) MCG/ACT inhaler, Inhale 2 puffs into the lungs every 6 (six) hours as needed for wheezing or shortness of breath., Disp: 18 g, Rfl: 6 Continue all other maintenance medications as listed above.  Follow up plan: Return in about 3 months (around 02/03/2017) for recheck.  Educational handout given for [place] PA-C [place] [address]  [place],  [address] [phone]   11/05/2016, 9:30 PM

## 2016-11-07 NOTE — Addendum Note (Signed)
Addended by: Thana Ates on: 11/07/2016 01:55 PM   Modules accepted: Orders

## 2016-11-08 ENCOUNTER — Other Ambulatory Visit: Payer: Self-pay | Admitting: Physician Assistant

## 2016-11-08 NOTE — Telephone Encounter (Signed)
Xanax called to [facility].

## 2016-11-22 ENCOUNTER — Encounter: Payer: Self-pay | Admitting: Cardiovascular Disease

## 2016-11-22 ENCOUNTER — Ambulatory Visit (INDEPENDENT_AMBULATORY_CARE_PROVIDER_SITE_OTHER): Payer: Medicare Other | Admitting: Cardiovascular Disease

## 2016-11-22 VITALS — BP 100/54 | HR 88 | Ht 66.0 in | Wt 186.0 lb

## 2016-11-22 DIAGNOSIS — I10 Essential (primary) hypertension: Secondary | ICD-10-CM

## 2016-11-22 DIAGNOSIS — I209 Angina pectoris, unspecified: Secondary | ICD-10-CM | POA: Diagnosis not present

## 2016-11-22 DIAGNOSIS — I739 Peripheral vascular disease, unspecified: Secondary | ICD-10-CM | POA: Diagnosis not present

## 2016-11-22 DIAGNOSIS — Z955 Presence of coronary angioplasty implant and graft: Secondary | ICD-10-CM | POA: Diagnosis not present

## 2016-11-22 DIAGNOSIS — E785 Hyperlipidemia, unspecified: Secondary | ICD-10-CM | POA: Diagnosis not present

## 2016-11-22 DIAGNOSIS — R079 Chest pain, unspecified: Secondary | ICD-10-CM

## 2016-11-22 DIAGNOSIS — I25118 Atherosclerotic heart disease of native coronary artery with other forms of angina pectoris: Secondary | ICD-10-CM | POA: Diagnosis not present

## 2016-11-22 DIAGNOSIS — Z720 Tobacco use: Secondary | ICD-10-CM

## 2016-11-22 MED ORDER — NITROGLYCERIN 0.4 MG SL SUBL: 0.4000 mg | SUBLINGUAL_TABLET | SUBLINGUAL | 3 refills | Status: AC | PRN

## 2016-11-22 MED ORDER — METOPROLOL TARTRATE 25 MG PO TABS: 25.0000 mg | ORAL_TABLET | Freq: Two times a day (BID) | ORAL | 0 refills | Status: DC

## 2016-11-22 NOTE — Patient Instructions (Signed)
Medication Instructions:  Your physician has recommended you make the following change in your medication:   STOP plavix  BEGIN metoprolol tartrate 25 mg twice daily  BEGIN nitroglycerin as needed for chest pain   PLEASE continue all other medications as prescribed  Labwork: none  Testing/Procedures: Your physician has requested that you have en exercise stress myoview. For further information please visit [email or web address]. Please follow instruction sheet, as given.  Your physician has requested that you have an ankle brachial index (ABI). During this test an ultrasound and blood pressure cuff are used to evaluate the arteries that supply the arms and legs with blood. Allow thirty minutes for this exam. There are no restrictions or special instructions.   Follow-Up: Your physician recommends that you schedule a follow-up appointment in: [place] DR. Bronson Ing  Any Other Special Instructions Will Be Listed Below (If Applicable).  If you need a refill on your cardiac medications before your next appointment, please call your pharmacy.

## 2016-11-22 NOTE — Progress Notes (Signed)
CARDIOLOGY CONSULT NOTE  Patient ID: Tommy Douglas MRN: [id number] DOB/AGE: Aug 17, 1950 66 y.o.  Admit date: (Not on file) Primary Physician: Terald Sleeper, PA-C Referring Physician: Ronnald Ramp  Reason for Consultation: CAD  HPI: Tommy Douglas is a 66 y.o. male who is being seen today for the evaluation of CAD at the request of Terald Sleeper, PA-C.   He reportedly has a history of drug-eluting stent placement to the RCA on 09/06/2002.  He also has a history of hypertension, tobacco abuse, hyperlipidemia, and insulin-dependent diabetes mellitus.  Most recent echocardiogram I find is dated 01/23/13 which showed normal left ventricular systolic function and some degree of diastolic dysfunction with mild LVH, LVEF 60-65%.  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  For the past one month he has been experiencing retrosternal sharp pains lasting several minutes. He denies associated shortness of breath, radiation of the pain, nausea, or vomiting. He has had lightheadedness and dizziness which may be more chronic.  He also describes a "toothache pain "in both legs with exertion. This has been going on for several months to over a year.  He has been smoking a pack of cigarettes daily since the [place] War.   Allergies  Allergen Reactions  . Atorvastatin     All statins make him cough    Current Outpatient Prescriptions  Medication Sig Dispense Refill  . allopurinol (ZYLOPRIM) 100 MG tablet Take 1 tablet (100 mg total) by mouth 3 (three) times daily. 270 tablet 3  . ALPRAZolam (XANAX) 0.5 MG tablet TAKE ONE TABLET TWICE DAILY AS NEEDED 180 tablet 0  . aspirin EC 81 MG tablet Take 81 mg by mouth daily.    . carvedilol (COREG) 25 MG tablet Take 1 tablet (25 mg total) by mouth 2 (two) times daily. 180 tablet 3  . cetirizine (ZYRTEC) 10 MG tablet Take 1 tablet (10 mg total) by mouth daily. 30 tablet 11   . clopidogrel (PLAVIX) 75 MG tablet Take 1 tablet (75 mg total) by mouth daily. D/C effient 90 tablet 3  . cyclobenzaprine (FLEXERIL) 10 MG tablet Take 1 tablet (10 mg total) by mouth 3 (three) times daily as needed for muscle spasms. 270 tablet 1  . famotidine (PEPCID) 20 MG tablet Take 1 tablet (20 mg total) by mouth daily. 30 tablet 11  . Febuxostat (ULORIC) 80 MG TABS TAKE ONE (1) TABLET EACH DAY 90 each 3  . hydrochlorothiazide (HYDRODIURIL) 25 MG tablet Take 1 tablet (25 mg total) by mouth 2 (two) times daily. 180 tablet 3  . HYDROcodone-acetaminophen (NORCO) 10-325 MG tablet Take 1 tablet by mouth every 8 (eight) hours as needed. 120 tablet 0  . liraglutide (VICTOZA) 18 MG/3ML SOPN INJECT 0.3ML SQ DAILY 27 mL 3  . LYRICA 200 MG capsule Take 1 capsule (200 mg total) by mouth 2 (two) times daily. 60 capsule 5  . pioglitazone (ACTOS) 45 MG tablet TAKE ONE (1) TABLET EACH DAY 90 tablet 3  . pravastatin (PRAVACHOL) 40 MG tablet TAKE ONE (1) TABLET EACH DAY 90 tablet 3  . prazosin (MINIPRESS) 1 MG capsule Take 1 mg by mouth at bedtime.    . predniSONE (DELTASONE) 10 MG tablet Take 10 mg by mouth daily.    . VENTOLIN HFA 108 (90 Base) MCG/ACT inhaler Inhale 2 puffs into the lungs every 6 (six) hours as needed for wheezing or shortness of breath. 18 g  6   No current facility-administered medications for this visit.     Past Medical History:  Diagnosis Date  . Allergy   . Anxiety   . Diabetes mellitus without complication ([place])   . Heart disease   . Hyperlipidemia   . Hypertension     Past Surgical History:  Procedure Laterality Date  . CHOLECYSTECTOMY    . HEART STENT      Social History   Social History  . Marital status: Single    Spouse name: N/A  . Number of children: N/A  . Years of education: N/A   Occupational History  . Not on file.   Social History Main Topics  . Smoking status: Current Every Day Smoker  . Smokeless tobacco: Never Used  . Alcohol use No  .  Drug use: No  . Sexual activity: Not on file   Other Topics Concern  . Not on file   Social History Narrative  . No narrative on file     No family history of premature CAD in 1st degree relatives.  Current Meds  Medication Sig  . allopurinol (ZYLOPRIM) 100 MG tablet Take 1 tablet (100 mg total) by mouth 3 (three) times daily.  [name] ALPRAZolam (XANAX) 0.5 MG tablet TAKE ONE TABLET TWICE DAILY AS NEEDED  . aspirin EC 81 MG tablet Take 81 mg by mouth daily.  . carvedilol (COREG) 25 MG tablet Take 1 tablet (25 mg total) by mouth 2 (two) times daily.  . cetirizine (ZYRTEC) 10 MG tablet Take 1 tablet (10 mg total) by mouth daily.  . clopidogrel (PLAVIX) 75 MG tablet Take 1 tablet (75 mg total) by mouth daily. D/C effient  . cyclobenzaprine (FLEXERIL) 10 MG tablet Take 1 tablet (10 mg total) by mouth 3 (three) times daily as needed for muscle spasms.  . famotidine (PEPCID) 20 MG tablet Take 1 tablet (20 mg total) by mouth daily.  . Febuxostat (ULORIC) 80 MG TABS TAKE ONE (1) TABLET EACH DAY  . hydrochlorothiazide (HYDRODIURIL) 25 MG tablet Take 1 tablet (25 mg total) by mouth 2 (two) times daily.  [name] HYDROcodone-acetaminophen (NORCO) 10-325 MG tablet Take 1 tablet by mouth every 8 (eight) hours as needed.  . liraglutide (VICTOZA) 18 MG/3ML SOPN INJECT 0.3ML SQ DAILY  . LYRICA 200 MG capsule Take 1 capsule (200 mg total) by mouth 2 (two) times daily.  . pioglitazone (ACTOS) 45 MG tablet TAKE ONE (1) TABLET EACH DAY  . pravastatin (PRAVACHOL) 40 MG tablet TAKE ONE (1) TABLET EACH DAY  . prazosin (MINIPRESS) 1 MG capsule Take 1 mg by mouth at bedtime.  . predniSONE (DELTASONE) 10 MG tablet Take 10 mg by mouth daily.  . VENTOLIN HFA 108 (90 Base) MCG/ACT inhaler Inhale 2 puffs into the lungs every 6 (six) hours as needed for wheezing or shortness of breath.      Review of systems complete and found to be negative unless listed above in HPI    Physical exam Blood pressure (!) 100/54, pulse  88, height 5\' 6"  (1.676 m), weight 186 lb (84.4 kg), SpO2 95 %. General: NAD Neck: No JVD, no thyromegaly or thyroid nodule.  Lungs: Clear to auscultation bilaterally with normal respiratory effort. CV: Nondisplaced PMI. Regular rate and rhythm, normal S1/S2, no S3/S4, no murmur.  No peripheral edema.  No carotid bruit.    Abdomen: Soft, nontender, no distention.  Skin: Intact without lesions or rashes.  Neurologic: Alert and oriented x 3.  Psych: Normal affect. Extremities:  No clubbing or cyanosis.  HEENT: Normal.   ECG: Most recent ECG reviewed.   Labs: Lab Results  Component Value Date/Time   K 4.5 11/03/2016 10:36 AM   BUN 18 11/03/2016 10:36 AM   CREATININE 1.81 (H) 11/03/2016 10:36 AM   ALT 6 11/03/2016 10:36 AM   TSH 2.170 04/25/2016 11:03 AM   HGB 11.3 (L) 11/03/2016 10:36 AM     Lipids: Lab Results  Component Value Date/Time   LDLCALC 82 11/03/2016 10:36 AM   CHOL 155 11/03/2016 10:36 AM   TRIG 246 (H) 11/03/2016 10:36 AM   HDL 24 (L) 11/03/2016 10:36 AM        ASSESSMENT AND PLAN:  1. Coronary artery disease with history of drug-eluting stent placement to the RCA and angina pectoris: Currently on ASA, Plavix, and statin. No indication for Plavix so I will discontinue. I will start metoprolol 25 mg twice daily. I will also prescribe sublingual nitroglycerin to be used as needed. I will arrange for an exercise Myoview stress test to evaluate for ischemia.  2. Hypertension: Controlled. No changes. Given concurrent IDDM, consider switching HCTZ to ACEI as thiazide will also contribute to hyperglycemia. Will need to carefully monitor renal function.  3. Hyperlipidemia: On pravastatin. Lipids 11/03/16-total cholesterol 155, triglycerides mildly elevated at 246, HDL low at 24, LDL 82. LDL previously 106 on 08/02/16.  4. CKD stage III: GFR 44 on 11/03/16.  5. Claudication: Given his long history of tobacco abuse, he is at a higher likelihood of having peripheral arterial  disease. I will obtain ABIs.  6. Tobacco abuse disorder: Chronic.     Disposition: Follow up in 3 weeks.   Signed: Kate Sable, M.D., F.A.C.C.  11/22/2016, 9:00 AM

## 2016-11-29 ENCOUNTER — Encounter (HOSPITAL_BASED_OUTPATIENT_CLINIC_OR_DEPARTMENT_OTHER)
Admission: RE | Admit: 2016-11-29 | Discharge: 2016-11-29 | Disposition: A | Discharge status: Home / Self Care | Payer: Medicare Other | Source: Ambulatory Visit | Attending: Cardiovascular Disease | Admitting: Cardiovascular Disease

## 2016-11-29 ENCOUNTER — Other Ambulatory Visit: Payer: Self-pay | Admitting: Cardiovascular Disease

## 2016-11-29 ENCOUNTER — Encounter (HOSPITAL_COMMUNITY)
Admission: RE | Admit: 2016-11-29 | Discharge: 2016-11-29 | Disposition: A | Discharge status: Home / Self Care | Payer: Medicare Other | Source: Ambulatory Visit | Attending: Cardiovascular Disease | Admitting: Cardiovascular Disease

## 2016-11-29 ENCOUNTER — Encounter (HOSPITAL_COMMUNITY): Payer: Self-pay

## 2016-11-29 DIAGNOSIS — R079 Chest pain, unspecified: Secondary | ICD-10-CM | POA: Insufficient documentation

## 2016-11-29 DIAGNOSIS — I739 Peripheral vascular disease, unspecified: Secondary | ICD-10-CM

## 2016-11-29 LAB — NM MYOCAR MULTI W/SPECT W/WALL MOTION / EF
CHL CUP NUCLEAR SDS: 1
CHL CUP NUCLEAR SRS: 3
CHL CUP RESTING HR STRESS: 82 {beats}/min
CSEPPHR: 118 {beats}/min
LHR: 0.38
LV dias vol: 74 mL (ref 62–150)
LVSYSVOL: 21 mL
SSS: 4
TID: 1.3

## 2016-11-29 MED ORDER — REGADENOSON 0.4 MG/5ML IV SOLN
INTRAVENOUS | Status: AC
  Administered 2016-11-29: 0.4 mg via INTRAVENOUS
  Filled 2016-11-29: qty 5

## 2016-11-29 MED ORDER — TECHNETIUM TC 99M TETROFOSMIN IV KIT
30.0000 | PACK | Freq: Once | INTRAVENOUS | Status: AC | PRN
  Administered 2016-11-29: 30 via INTRAVENOUS

## 2016-11-29 MED ORDER — SODIUM CHLORIDE 0.9% FLUSH
INTRAVENOUS | Status: AC
  Administered 2016-11-29: 10 mL via INTRAVENOUS
  Filled 2016-11-29: qty 10

## 2016-11-29 MED ORDER — TECHNETIUM TC 99M TETROFOSMIN IV KIT
10.0000 | PACK | Freq: Once | INTRAVENOUS | Status: AC | PRN
  Administered 2016-11-29: 10 via INTRAVENOUS

## 2016-11-30 ENCOUNTER — Ambulatory Visit: Payer: Medicare Other

## 2016-11-30 DIAGNOSIS — I739 Peripheral vascular disease, unspecified: Secondary | ICD-10-CM | POA: Diagnosis not present

## 2016-12-01 ENCOUNTER — Telehealth: Payer: Self-pay | Admitting: *Deleted

## 2016-12-01 LAB — VAS US LOWER EXTREMITY ARTERIAL DUPLEX
LEFT PERO DIST SYS: -97 cm/s
LPOPDPSV: 68 cm/s
LPOPPPSV: -81 cm/s
LSFMPSV: -199 cm/s
Left ant tibial distal sys: 82 cm/s
Left super femoral dist sys PSV: -179 cm/s
Left super femoral prox sys PSV: 112 cm/s
RATIBDISTSYS: 101 cm/s
RPOPDPSV: 67 cm/s
Right peroneal sys PSV: 67 cm/s
Right popliteal prox sys PSV: 79 cm/s
Right super femoral dist sys PSV: -216 cm/s
Right super femoral mid sys PSV: -215 cm/s
Right super femoral prox sys PSV: -134 cm/s

## 2016-12-01 NOTE — Telephone Encounter (Signed)
Notes recorded by Laurine Blazer, LPN on 0/0/3496 at 1:16 AM EDT Patient notified. Copy to pmd. ------  Notes recorded by Herminio Commons, MD on 11/29/2016 at 3:30 PM EDT I will discuss at ov. Abnormalities are seen suggestive of a blockage, which may explain his symptoms. I will manage medically. Ask him to take SL nitro as needed.

## 2016-12-01 NOTE — Telephone Encounter (Signed)
Notes recorded by Laurine Blazer, LPN on 04/03/2881 at 3:74 AM EDT Significant other Casilda Carls) notified. Copy to pmd. Follow up already scheduled for 12/14/2016. ------  Notes recorded by Herminio Commons, MD on 12/01/2016 at 8:30 AM EDT Mild blockages of both legs. Needs tobacco cessation and exercise. This will gradually improve symptoms.

## 2016-12-01 NOTE — Telephone Encounter (Signed)
Follow up already scheduled for 12/14/2016 with Dr. Bronson Ing.

## 2016-12-06 ENCOUNTER — Telehealth: Payer: Self-pay | Admitting: Physician Assistant

## 2016-12-06 NOTE — Telephone Encounter (Signed)
Aware. No call from our office.

## 2016-12-14 ENCOUNTER — Encounter: Payer: Self-pay | Admitting: *Deleted

## 2016-12-14 ENCOUNTER — Ambulatory Visit (INDEPENDENT_AMBULATORY_CARE_PROVIDER_SITE_OTHER): Payer: Medicare Other | Admitting: Cardiovascular Disease

## 2016-12-14 ENCOUNTER — Encounter: Payer: Self-pay | Admitting: Cardiovascular Disease

## 2016-12-14 ENCOUNTER — Telehealth: Payer: Self-pay | Admitting: Cardiovascular Disease

## 2016-12-14 ENCOUNTER — Other Ambulatory Visit: Payer: Self-pay | Admitting: Cardiovascular Disease

## 2016-12-14 VITALS — BP 120/60 | HR 100 | Ht 65.0 in | Wt 186.0 lb

## 2016-12-14 DIAGNOSIS — R0602 Shortness of breath: Secondary | ICD-10-CM

## 2016-12-14 DIAGNOSIS — Z01812 Encounter for preprocedural laboratory examination: Secondary | ICD-10-CM | POA: Diagnosis not present

## 2016-12-14 DIAGNOSIS — Z955 Presence of coronary angioplasty implant and graft: Secondary | ICD-10-CM | POA: Diagnosis not present

## 2016-12-14 DIAGNOSIS — E785 Hyperlipidemia, unspecified: Secondary | ICD-10-CM | POA: Diagnosis not present

## 2016-12-14 DIAGNOSIS — I209 Angina pectoris, unspecified: Secondary | ICD-10-CM

## 2016-12-14 DIAGNOSIS — I10 Essential (primary) hypertension: Secondary | ICD-10-CM | POA: Diagnosis not present

## 2016-12-14 DIAGNOSIS — R9439 Abnormal result of other cardiovascular function study: Secondary | ICD-10-CM | POA: Diagnosis not present

## 2016-12-14 DIAGNOSIS — I25118 Atherosclerotic heart disease of native coronary artery with other forms of angina pectoris: Secondary | ICD-10-CM | POA: Diagnosis not present

## 2016-12-14 DIAGNOSIS — I739 Peripheral vascular disease, unspecified: Secondary | ICD-10-CM

## 2016-12-14 DIAGNOSIS — Z720 Tobacco use: Secondary | ICD-10-CM

## 2016-12-14 NOTE — Progress Notes (Signed)
SUBJECTIVE: The patient returns for follow-up after undergoing cardiovascular testing performed for the evaluation of chest pain.  Nuclear stress test 11/29/16 showed ischemia involving the basal inferior, basal inferolateral, mid inferior, mid inferolateral, apical inferior and apical lateral myocardial regions.  ABIs showed aortoiliac atherosclerosis with greater than 50% stenosis of the bilateral external iliac arteries, left greater than right, 50-74% stenosis of the mid and distal right SFA, 30-49% stenosis of the left mid SFA, and 50-74% stenosis of the left distal SFA. There was two-vessel runoff bilaterally with both posterior tibial arteries being occluded.  He reportedly has a history of drug-eluting stent placement to the RCA on 09/06/2002.  He also has a history of hypertension, tobacco abuse, hyperlipidemia, and insulin-dependent diabetes mellitus.  Most recent echocardiogram I find is dated 01/23/13 which showed normal left ventricular systolic function and some degree of diastolic dysfunction with mild LVH, LVEF 60-65%.   He continues to have chest pain, exertional dyspnea, and fatigue. His wife notices that he clutches his chest. He has not taken nitroglycerin.  Review of Systems: As per "subjective", otherwise negative.  Allergies  Allergen Reactions  . Atorvastatin     All statins make him cough    Current Outpatient Prescriptions  Medication Sig Dispense Refill  . allopurinol (ZYLOPRIM) 100 MG tablet Take 1 tablet (100 mg total) by mouth 3 (three) times daily. 270 tablet 3  . ALPRAZolam (XANAX) 0.5 MG tablet TAKE ONE TABLET TWICE DAILY AS NEEDED 180 tablet 0  . aspirin EC 81 MG tablet Take 81 mg by mouth daily.    . carvedilol (COREG) 25 MG tablet Take 1 tablet (25 mg total) by mouth 2 (two) times daily. 180 tablet 3  . cetirizine (ZYRTEC) 10 MG tablet Take 1 tablet (10 mg total) by mouth daily. 30 tablet 11  . cyclobenzaprine (FLEXERIL) 10 MG tablet Take 1  tablet (10 mg total) by mouth 3 (three) times daily as needed for muscle spasms. 270 tablet 1  . famotidine (PEPCID) 20 MG tablet Take 1 tablet (20 mg total) by mouth daily. 30 tablet 11  . Febuxostat (ULORIC) 80 MG TABS TAKE ONE (1) TABLET EACH DAY 90 each 3  . hydrochlorothiazide (HYDRODIURIL) 25 MG tablet Take 1 tablet (25 mg total) by mouth 2 (two) times daily. 180 tablet 3  . HYDROcodone-acetaminophen (NORCO) 10-325 MG tablet Take 1 tablet by mouth every 8 (eight) hours as needed. 120 tablet 0  . liraglutide (VICTOZA) 18 MG/3ML SOPN INJECT 0.3ML SQ DAILY 27 mL 3  . LYRICA 200 MG capsule Take 1 capsule (200 mg total) by mouth 2 (two) times daily. 60 capsule 5  . metoprolol tartrate (LOPRESSOR) 25 MG tablet Take 1 tablet (25 mg total) by mouth 2 (two) times daily. 180 tablet 0  . nitroGLYCERIN (NITROSTAT) 0.4 MG SL tablet Place 1 tablet (0.4 mg total) under the tongue every 5 (five) minutes as needed for chest pain. 25 tablet 3  . pioglitazone (ACTOS) 45 MG tablet TAKE ONE (1) TABLET EACH DAY 90 tablet 3  . pravastatin (PRAVACHOL) 40 MG tablet TAKE ONE (1) TABLET EACH DAY 90 tablet 3  . prazosin (MINIPRESS) 1 MG capsule Take 1 mg by mouth at bedtime.    . predniSONE (DELTASONE) 10 MG tablet Take 10 mg by mouth daily.    . VENTOLIN HFA 108 (90 Base) MCG/ACT inhaler Inhale 2 puffs into the lungs every 6 (six) hours as needed for wheezing or shortness of breath. 18 g  6   No current facility-administered medications for this visit.     Past Medical History:  Diagnosis Date  . Allergy   . Anxiety   . Diabetes mellitus without complication ([place])   . Heart disease   . Hyperlipidemia   . Hypertension     Past Surgical History:  Procedure Laterality Date  . CHOLECYSTECTOMY    . HEART STENT      Social History   Social History  . Marital status: Single    Spouse name: N/A  . Number of children: N/A  . Years of education: N/A   Occupational History  . Not on file.   Social  History Main Topics  . Smoking status: Current Every Day Smoker    Packs/day: 1.50  . Smokeless tobacco: Never Used  . Alcohol use No  . Drug use: No  . Sexual activity: Not on file   Other Topics Concern  . Not on file   Social History Narrative  . No narrative on file     Vitals:   12/14/16 1515  BP: 120/60  Pulse: 100  SpO2: 97%  Weight: 186 lb (84.4 kg)  Height: 5\' 5"  (1.651 m)    Wt Readings from Last 3 Encounters:  12/14/16 186 lb (84.4 kg)  11/22/16 186 lb (84.4 kg)  11/03/16 186 lb 6.4 oz (84.6 kg)     PHYSICAL EXAM General: NAD HEENT: Normal. Neck: No JVD, no thyromegaly. Lungs: Clear to auscultation bilaterally with normal respiratory effort. CV: Nondisplaced PMI.  Regular rate and rhythm, normal S1/S2, no S3/S4, no murmur. No pretibial or periankle edema.     Abdomen: Soft, nontender, no distention.  Neurologic: Alert and oriented.  Psych: Normal affect. Skin: Normal. Musculoskeletal: No gross deformities.    ECG: Most recent ECG reviewed.   Labs: Lab Results  Component Value Date/Time   K 4.5 11/03/2016 10:36 AM   BUN 18 11/03/2016 10:36 AM   CREATININE 1.81 (H) 11/03/2016 10:36 AM   ALT 6 11/03/2016 10:36 AM   TSH 2.170 04/25/2016 11:03 AM   HGB 11.3 (L) 11/03/2016 10:36 AM     Lipids: Lab Results  Component Value Date/Time   LDLCALC 82 11/03/2016 10:36 AM   CHOL 155 11/03/2016 10:36 AM   TRIG 246 (H) 11/03/2016 10:36 AM   HDL 24 (L) 11/03/2016 10:36 AM       ASSESSMENT AND PLAN:  1. Coronary artery disease with history of drug-eluting stent placement to the RCA and angina pectoris with abnormal nuclear stress test: He continues to experience exertional chest tightness, exertional dyspnea, and fatigue. Currently on ASA, Plavix, Coreg, and statin.  I again informed him to use sublingual nitroglycerin as needed.  I will arrange for coronary angiography. Risks and benefits of cardiac catheterization have been discussed with the  patient.  These include bleeding, infection, kidney damage, stroke, heart attack, death.  The patient understands these risks and is willing to proceed.  2. Hypertension: Controlled. No changes. Given concurrent IDDM, consider switching HCTZ to ACEI as thiazide will also contribute to hyperglycemia. Will need to carefully monitor renal function.  3. Hyperlipidemia: On pravastatin. Lipids 11/03/16-total cholesterol 155, triglycerides mildly elevated at 246, HDL low at 24, LDL 82. LDL previously 106 on 08/02/16.  4. CKD stage III: GFR 44 on 11/03/16.  5. Claudication with PVD: He has a long history of tobacco abuse. ABIs showed aortoiliac atherosclerosis with greater than 50% stenosis of the bilateral external iliac arteries, left greater than right, 50-74%  stenosis of the mid and distal right SFA, 30-49% stenosis of the left mid SFA, and 50-74% stenosis of the left distal SFA. There was two-vessel runoff bilaterally with both posterior tibial arteries being occluded. He needs tobacco cessation. I will make a referral to one of my colleagues who specializes in peripheral vascular disease. Continue aspirin and statin.  6. Tobacco abuse disorder: Chronic. Needs cessation given coronary disease and peripheral vascular disease. He is reluctant to consider quitting.     Disposition: Follow up 1 month.  Time spent: 40 minutes, of which greater than 50% was spent reviewing symptoms, relevant blood tests and studies, and discussing management plan with the patient.    Kate Sable, M.D., F.A.C.C.

## 2016-12-14 NOTE — Addendum Note (Signed)
Addended by: Laurine Blazer on: 12/14/2016 04:40 PM   Modules accepted: Orders

## 2016-12-14 NOTE — Telephone Encounter (Signed)
Pre-cert Verification for the following procedure   Left heart cath - Wednesday, 9/26 - 1:00 - [place]

## 2016-12-14 NOTE — Patient Instructions (Addendum)
Medication Instructions:   Stop Metoprolol (Lopressor).   Continue all other medications.    Labwork: BMET, CBC, PT, PTT - orders given today.   Testing/Procedures:  Chest x-ray.  Your physician has requested that you have a cardiac catheterization. Cardiac catheterization is used to diagnose and/or treat various heart conditions. Doctors may recommend this procedure for a number of different reasons. The most common reason is to evaluate chest pain. Chest pain can be a symptom of coronary artery disease (CAD), and cardiac catheterization can show whether plaque is narrowing or blocking your heart's arteries. This procedure is also used to evaluate the valves, as well as measure the blood flow and oxygen levels in different parts of your heart. For further information please visit [email or web address]. Please follow instruction sheet, as given.  Follow-Up: 1 month   Any Other Special Instructions Will Be Listed Below (If Applicable). You have been referred to:  Dr. Val Riles - eval for PVD.   If you need a refill on your cardiac medications before your next appointment, please call your pharmacy.

## 2016-12-18 DIAGNOSIS — R9439 Abnormal result of other cardiovascular function study: Secondary | ICD-10-CM | POA: Diagnosis not present

## 2016-12-18 DIAGNOSIS — Z01812 Encounter for preprocedural laboratory examination: Secondary | ICD-10-CM | POA: Diagnosis not present

## 2016-12-18 DIAGNOSIS — R0602 Shortness of breath: Secondary | ICD-10-CM | POA: Diagnosis not present

## 2016-12-18 LAB — PROTIME-INR

## 2016-12-19 ENCOUNTER — Telehealth: Payer: Self-pay

## 2016-12-19 NOTE — Telephone Encounter (Signed)
Patient contacted pre-catheterization at [facility] scheduled for:  12/20/2016 @ 1300 Verified arrival time and place: NT @ 0900-notified Pt to arrive at 900 am, he will try to be there at that time Confirmed AM meds to be taken pre-cath with sip of water: Take ASA  Do not take Victoza/actos am of Confirmed patient has responsible person to drive home post procedure and observe patient for 24 hours:  yes Addl concerns:  Pt with CR 1.66.  Requested Pt arrival at 9:00 am for fluids.  Pt states he will arrive as early as he can.  Cath lab notified.

## 2016-12-20 ENCOUNTER — Inpatient Hospital Stay (HOSPITAL_COMMUNITY)
Admission: RE | Disposition: A | Discharge status: Home / Self Care | Payer: Self-pay | Source: Home / Self Care | Attending: Cardiovascular Disease

## 2016-12-20 ENCOUNTER — Inpatient Hospital Stay (HOSPITAL_COMMUNITY)
Admission: RE | Admit: 2016-12-20 | Discharge: 2016-12-22 | DRG: 247 | Disposition: A | Discharge status: Home / Self Care | Payer: Medicare Other | Attending: Interventional Cardiology | Admitting: Interventional Cardiology

## 2016-12-20 ENCOUNTER — Encounter (HOSPITAL_COMMUNITY): Payer: Self-pay | Admitting: General Practice

## 2016-12-20 DIAGNOSIS — F17210 Nicotine dependence, cigarettes, uncomplicated: Secondary | ICD-10-CM | POA: Diagnosis present

## 2016-12-20 DIAGNOSIS — N183 Chronic kidney disease, stage 3 (moderate): Secondary | ICD-10-CM | POA: Diagnosis present

## 2016-12-20 DIAGNOSIS — Z7952 Long term (current) use of systemic steroids: Secondary | ICD-10-CM

## 2016-12-20 DIAGNOSIS — I25110 Atherosclerotic heart disease of native coronary artery with unstable angina pectoris: Principal | ICD-10-CM | POA: Diagnosis present

## 2016-12-20 DIAGNOSIS — Z794 Long term (current) use of insulin: Secondary | ICD-10-CM

## 2016-12-20 DIAGNOSIS — I10 Essential (primary) hypertension: Secondary | ICD-10-CM

## 2016-12-20 DIAGNOSIS — Z7982 Long term (current) use of aspirin: Secondary | ICD-10-CM | POA: Diagnosis not present

## 2016-12-20 DIAGNOSIS — Z955 Presence of coronary angioplasty implant and graft: Secondary | ICD-10-CM

## 2016-12-20 DIAGNOSIS — R9439 Abnormal result of other cardiovascular function study: Secondary | ICD-10-CM

## 2016-12-20 DIAGNOSIS — Z888 Allergy status to other drugs, medicaments and biological substances status: Secondary | ICD-10-CM | POA: Diagnosis not present

## 2016-12-20 DIAGNOSIS — E1151 Type 2 diabetes mellitus with diabetic peripheral angiopathy without gangrene: Secondary | ICD-10-CM | POA: Diagnosis not present

## 2016-12-20 DIAGNOSIS — N182 Chronic kidney disease, stage 2 (mild): Secondary | ICD-10-CM | POA: Diagnosis not present

## 2016-12-20 DIAGNOSIS — E1122 Type 2 diabetes mellitus with diabetic chronic kidney disease: Secondary | ICD-10-CM

## 2016-12-20 DIAGNOSIS — R079 Chest pain, unspecified: Secondary | ICD-10-CM | POA: Diagnosis not present

## 2016-12-20 DIAGNOSIS — I200 Unstable angina: Secondary | ICD-10-CM | POA: Diagnosis not present

## 2016-12-20 DIAGNOSIS — E785 Hyperlipidemia, unspecified: Secondary | ICD-10-CM

## 2016-12-20 DIAGNOSIS — I739 Peripheral vascular disease, unspecified: Secondary | ICD-10-CM | POA: Diagnosis present

## 2016-12-20 DIAGNOSIS — I129 Hypertensive chronic kidney disease with stage 1 through stage 4 chronic kidney disease, or unspecified chronic kidney disease: Secondary | ICD-10-CM | POA: Diagnosis not present

## 2016-12-20 DIAGNOSIS — I209 Angina pectoris, unspecified: Secondary | ICD-10-CM

## 2016-12-20 DIAGNOSIS — Z79899 Other long term (current) drug therapy: Secondary | ICD-10-CM

## 2016-12-20 DIAGNOSIS — E119 Type 2 diabetes mellitus without complications: Secondary | ICD-10-CM | POA: Diagnosis present

## 2016-12-20 DIAGNOSIS — I2510 Atherosclerotic heart disease of native coronary artery without angina pectoris: Secondary | ICD-10-CM | POA: Diagnosis not present

## 2016-12-20 DIAGNOSIS — Z9861 Coronary angioplasty status: Secondary | ICD-10-CM | POA: Diagnosis not present

## 2016-12-20 DIAGNOSIS — I708 Atherosclerosis of other arteries: Secondary | ICD-10-CM | POA: Diagnosis not present

## 2016-12-20 DIAGNOSIS — E1165 Type 2 diabetes mellitus with hyperglycemia: Secondary | ICD-10-CM | POA: Diagnosis not present

## 2016-12-20 DIAGNOSIS — M1A9XX0 Chronic gout, unspecified, without tophus (tophi): Secondary | ICD-10-CM

## 2016-12-20 DIAGNOSIS — E1169 Type 2 diabetes mellitus with other specified complication: Secondary | ICD-10-CM

## 2016-12-20 DIAGNOSIS — M5136 Other intervertebral disc degeneration, lumbar region: Secondary | ICD-10-CM

## 2016-12-20 DIAGNOSIS — J3089 Other allergic rhinitis: Secondary | ICD-10-CM

## 2016-12-20 DIAGNOSIS — M51369 Other intervertebral disc degeneration, lumbar region without mention of lumbar back pain or lower extremity pain: Secondary | ICD-10-CM

## 2016-12-20 HISTORY — DX: Type 2 diabetes mellitus without complications: E11.9

## 2016-12-20 HISTORY — DX: Low back pain, unspecified: M54.50

## 2016-12-20 HISTORY — DX: Unspecified osteoarthritis, unspecified site: M19.90

## 2016-12-20 HISTORY — DX: Other complications of anesthesia, initial encounter: T88.59XA

## 2016-12-20 HISTORY — DX: Atherosclerotic heart disease of native coronary artery without angina pectoris: I25.10

## 2016-12-20 HISTORY — DX: Adverse effect of unspecified anesthetic, initial encounter: T41.45XA

## 2016-12-20 HISTORY — DX: Other seasonal allergic rhinitis: J30.2

## 2016-12-20 HISTORY — PX: CARDIAC CATHETERIZATION: SHX172

## 2016-12-20 HISTORY — PX: LEFT HEART CATH AND CORONARY ANGIOGRAPHY: CATH118249

## 2016-12-20 HISTORY — DX: Low back pain: M54.5

## 2016-12-20 HISTORY — DX: Other chronic pain: G89.29

## 2016-12-20 HISTORY — DX: Pneumonia, unspecified organism: J18.9

## 2016-12-20 HISTORY — DX: Chronic kidney disease, stage 4 (severe): N18.4

## 2016-12-20 HISTORY — DX: Gout, unspecified: M10.9

## 2016-12-20 LAB — GLUCOSE, CAPILLARY
GLUCOSE-CAPILLARY: 105 mg/dL — AB (ref 65–99)
GLUCOSE-CAPILLARY: 92 mg/dL (ref 65–99)
GLUCOSE-CAPILLARY: 118 mg/dL — AB (ref 65–99)
GLUCOSE-CAPILLARY: 158 mg/dL — AB (ref 65–99)

## 2016-12-20 SURGERY — LEFT HEART CATH AND CORONARY ANGIOGRAPHY
Anesthesia: LOCAL

## 2016-12-20 MED ORDER — ACETAMINOPHEN 325 MG PO TABS: 650.0000 mg | ORAL_TABLET | ORAL | Status: DC | PRN

## 2016-12-20 MED ORDER — ASPIRIN 81 MG PO CHEW
81.0000 mg | CHEWABLE_TABLET | ORAL | Status: AC
  Administered 2016-12-20: 81 mg via ORAL

## 2016-12-20 MED ORDER — CLOPIDOGREL BISULFATE 75 MG PO TABS
ORAL_TABLET | ORAL | Status: AC
  Filled 2016-12-20: qty 1

## 2016-12-20 MED ORDER — SODIUM CHLORIDE 0.9% FLUSH: 3.0000 mL | INTRAVENOUS | Status: DC | PRN

## 2016-12-20 MED ORDER — MIDAZOLAM HCL 2 MG/2ML IJ SOLN
INTRAMUSCULAR | Status: DC | PRN
  Administered 2016-12-20: 2 mg via INTRAVENOUS

## 2016-12-20 MED ORDER — SODIUM CHLORIDE 0.9 % IV SOLN
INTRAVENOUS | Status: DC
  Administered 2016-12-20: 10:00:00 via INTRAVENOUS

## 2016-12-20 MED ORDER — HEPARIN SODIUM (PORCINE) 1000 UNIT/ML IJ SOLN
INTRAMUSCULAR | Status: DC | PRN
  Administered 2016-12-20: 4200 [IU] via INTRAVENOUS

## 2016-12-20 MED ORDER — LIRAGLUTIDE 18 MG/3ML ~~LOC~~ SOPN: 1.8000 mg | PEN_INJECTOR | Freq: Every day | SUBCUTANEOUS | Status: DC

## 2016-12-20 MED ORDER — SODIUM CHLORIDE 0.9 % IV SOLN: 250.0000 mL | INTRAVENOUS | Status: DC | PRN

## 2016-12-20 MED ORDER — HEPARIN (PORCINE) IN NACL 2-0.9 UNIT/ML-% IJ SOLN
INTRAMUSCULAR | Status: AC | PRN
  Administered 2016-12-20: 1000 mL

## 2016-12-20 MED ORDER — FENTANYL CITRATE (PF) 100 MCG/2ML IJ SOLN
INTRAMUSCULAR | Status: DC | PRN
  Administered 2016-12-20: 50 ug via INTRAVENOUS

## 2016-12-20 MED ORDER — FEBUXOSTAT 40 MG PO TABS
80.0000 mg | ORAL_TABLET | Freq: Every day | ORAL | Status: DC
  Administered 2016-12-20 – 2016-12-21 (×2): 80 mg via ORAL
  Filled 2016-12-20 (×2): qty 2

## 2016-12-20 MED ORDER — SODIUM CHLORIDE 0.9 % IV SOLN: INTRAVENOUS | Status: AC

## 2016-12-20 MED ORDER — IOPAMIDOL (ISOVUE-370) INJECTION 76%
INTRAVENOUS | Status: AC
  Filled 2016-12-20: qty 100

## 2016-12-20 MED ORDER — CLOPIDOGREL BISULFATE 75 MG PO TABS
75.0000 mg | ORAL_TABLET | Freq: Every day | ORAL | Status: DC
  Administered 2016-12-21 – 2016-12-22 (×2): 75 mg via ORAL
  Filled 2016-12-20 (×2): qty 1

## 2016-12-20 MED ORDER — IOPAMIDOL (ISOVUE-370) INJECTION 76%
INTRAVENOUS | Status: DC | PRN
  Administered 2016-12-20: 65 mL via INTRA_ARTERIAL

## 2016-12-20 MED ORDER — CARVEDILOL 25 MG PO TABS
25.0000 mg | ORAL_TABLET | Freq: Two times a day (BID) | ORAL | Status: DC
  Administered 2016-12-20 – 2016-12-22 (×3): 25 mg via ORAL
  Filled 2016-12-20: qty 1
  Filled 2016-12-20: qty 2
  Filled 2016-12-20: qty 1

## 2016-12-20 MED ORDER — VERAPAMIL HCL 2.5 MG/ML IV SOLN
INTRAVENOUS | Status: AC
  Filled 2016-12-20: qty 2

## 2016-12-20 MED ORDER — HEPARIN SODIUM (PORCINE) 1000 UNIT/ML IJ SOLN
INTRAMUSCULAR | Status: AC
  Filled 2016-12-20: qty 1

## 2016-12-20 MED ORDER — SODIUM CHLORIDE 0.9% FLUSH
3.0000 mL | Freq: Two times a day (BID) | INTRAVENOUS | Status: DC
Start: 2016-12-20 — End: 2016-12-20

## 2016-12-20 MED ORDER — SODIUM CHLORIDE 0.9% FLUSH
3.0000 mL | Freq: Two times a day (BID) | INTRAVENOUS | Status: DC
  Administered 2016-12-21: 3 mL via INTRAVENOUS

## 2016-12-20 MED ORDER — HEPARIN (PORCINE) IN NACL 2-0.9 UNIT/ML-% IJ SOLN
INTRAMUSCULAR | Status: AC
  Filled 2016-12-20: qty 1000

## 2016-12-20 MED ORDER — PIOGLITAZONE HCL 45 MG PO TABS
45.0000 mg | ORAL_TABLET | Freq: Every evening | ORAL | Status: DC
  Administered 2016-12-20 – 2016-12-21 (×2): 45 mg via ORAL
  Filled 2016-12-20 (×3): qty 1

## 2016-12-20 MED ORDER — ASPIRIN EC 81 MG PO TBEC: 81.0000 mg | DELAYED_RELEASE_TABLET | Freq: Every day | ORAL | Status: DC

## 2016-12-20 MED ORDER — PRAVASTATIN SODIUM 40 MG PO TABS
40.0000 mg | ORAL_TABLET | Freq: Every evening | ORAL | Status: DC
  Administered 2016-12-20 – 2016-12-21 (×2): 40 mg via ORAL
  Filled 2016-12-20 (×2): qty 1

## 2016-12-20 MED ORDER — ALLOPURINOL 100 MG PO TABS
100.0000 mg | ORAL_TABLET | Freq: Three times a day (TID) | ORAL | Status: DC
  Administered 2016-12-20 – 2016-12-22 (×5): 100 mg via ORAL
  Filled 2016-12-20 (×5): qty 1

## 2016-12-20 MED ORDER — NITROGLYCERIN 0.4 MG SL SUBL
0.4000 mg | SUBLINGUAL_TABLET | SUBLINGUAL | Status: DC | PRN
  Administered 2016-12-21 (×2): 0.4 mg via SUBLINGUAL
  Filled 2016-12-20: qty 1

## 2016-12-20 MED ORDER — VERAPAMIL HCL 2.5 MG/ML IV SOLN
INTRAVENOUS | Status: DC | PRN
  Administered 2016-12-20: 12:00:00 via INTRA_ARTERIAL

## 2016-12-20 MED ORDER — LIDOCAINE HCL 2 % IJ SOLN
INTRAMUSCULAR | Status: AC
  Filled 2016-12-20: qty 10

## 2016-12-20 MED ORDER — ONDANSETRON HCL 4 MG/2ML IJ SOLN: 4.0000 mg | Freq: Four times a day (QID) | INTRAMUSCULAR | Status: DC | PRN

## 2016-12-20 MED ORDER — MIDAZOLAM HCL 2 MG/2ML IJ SOLN
INTRAMUSCULAR | Status: AC
  Filled 2016-12-20: qty 2

## 2016-12-20 MED ORDER — ANGIOPLASTY BOOK
Freq: Once | Status: AC
  Administered 2016-12-20: 21:00:00
  Filled 2016-12-20: qty 1

## 2016-12-20 MED ORDER — ALPRAZOLAM 0.5 MG PO TABS: 0.5000 mg | ORAL_TABLET | Freq: Two times a day (BID) | ORAL | Status: DC | PRN

## 2016-12-20 MED ORDER — CLOPIDOGREL BISULFATE 75 MG PO TABS
75.0000 mg | ORAL_TABLET | Freq: Once | ORAL | Status: AC
  Administered 2016-12-20: 75 mg via ORAL

## 2016-12-20 MED ORDER — ASPIRIN 81 MG PO CHEW
CHEWABLE_TABLET | ORAL | Status: AC
Start: 2016-12-20 — End: 2016-12-20
  Administered 2016-12-20: 81 mg via ORAL
  Filled 2016-12-20: qty 1

## 2016-12-20 MED ORDER — FENTANYL CITRATE (PF) 100 MCG/2ML IJ SOLN
INTRAMUSCULAR | Status: AC
  Filled 2016-12-20: qty 2

## 2016-12-20 MED ORDER — METOPROLOL TARTRATE 25 MG PO TABS
25.0000 mg | ORAL_TABLET | Freq: Two times a day (BID) | ORAL | Status: DC
  Filled 2016-12-20: qty 2

## 2016-12-20 SURGICAL SUPPLY — 12 items
CATH 5FR JL3.5 JR4 ANG PIG MP (CATHETERS) ×2 IMPLANT
COVER PRB 48X5XTLSCP FOLD TPE (BAG) ×1 IMPLANT
COVER PROBE 5X48 (BAG) ×1
DEVICE RAD COMP TR BAND LRG (VASCULAR PRODUCTS) ×2 IMPLANT
GLIDESHEATH SLEND SS 6F .021 (SHEATH) ×2 IMPLANT
GUIDEWIRE INQWIRE 1.5J.035X260 (WIRE) ×1 IMPLANT
INQWIRE 1.5J .035X260CM (WIRE) ×2
KIT HEART LEFT (KITS) ×2 IMPLANT
PACK CARDIAC CATHETERIZATION (CUSTOM PROCEDURE TRAY) ×2 IMPLANT
TRANSDUCER W/STOPCOCK (MISCELLANEOUS) ×2 IMPLANT
TUBING CIL FLEX 10 FLL-RA (TUBING) ×2 IMPLANT
WIRE HI TORQ VERSACORE-J 145CM (WIRE) ×2 IMPLANT

## 2016-12-20 NOTE — Interval H&P Note (Signed)
History and Physical Interval Note:  12/20/2016 11:28 AM  Tommy Douglas  has presented today for cardiac cath with the diagnosis of abnormal stress test, known CAD, unstable angina. The various methods of treatment have been discussed with the patient and family. After consideration of risks, benefits and other options for treatment, the patient has consented to  Procedure(s): LEFT HEART CATH AND CORONARY ANGIOGRAPHY (N/A) as a surgical intervention .  The patient's history has been reviewed, patient examined, no change in status, stable for surgery.  I have reviewed the patient's chart and labs.  Questions were answered to the patient's satisfaction.    Cath Lab Visit (complete for each Cath Lab visit)  Clinical Evaluation Leading to the Procedure:   ACS: No.  Non-ACS:    Anginal Classification: CCS III  Anti-ischemic medical therapy: Maximal Therapy (2 or more classes of medications)  Non-Invasive Test Results: Intermediate-risk stress test findings: cardiac mortality 1-3%/year  Prior CABG: No previous CABG         Lauree Chandler

## 2016-12-20 NOTE — Progress Notes (Signed)
TR BAND REMOVAL  LOCATION:    Right radial  DEFLATED PER PROTOCOL:    Yes.    TIME BAND OFF / DRESSING APPLIED:    1405p   SITE UPON ARRIVAL:    Level 0  SITE AFTER BAND REMOVAL:    Level 0  CIRCULATION SENSATION AND MOVEMENT:    Within Normal Limits   Yes.    COMMENTS:   2+  Radial pulse.  Pt denies any discomfort at site

## 2016-12-20 NOTE — H&P (View-Only) (Signed)
SUBJECTIVE: The patient returns for follow-up after undergoing cardiovascular testing performed for the evaluation of chest pain.  Nuclear stress test 11/29/16 showed ischemia involving the basal inferior, basal inferolateral, mid inferior, mid inferolateral, apical inferior and apical lateral myocardial regions.  ABIs showed aortoiliac atherosclerosis with greater than 50% stenosis of the bilateral external iliac arteries, left greater than right, 50-74% stenosis of the mid and distal right SFA, 30-49% stenosis of the left mid SFA, and 50-74% stenosis of the left distal SFA. There was two-vessel runoff bilaterally with both posterior tibial arteries being occluded.  He reportedly has a history of drug-eluting stent placement to the RCA on 09/06/2002.  He also has a history of hypertension, tobacco abuse, hyperlipidemia, and insulin-dependent diabetes mellitus.  Most recent echocardiogram I find is dated 01/23/13 which showed normal left ventricular systolic function and some degree of diastolic dysfunction with mild LVH, LVEF 60-65%.   He continues to have chest pain, exertional dyspnea, and fatigue. His wife notices that he clutches his chest. He has not taken nitroglycerin.  Review of Systems: As per "subjective", otherwise negative.  Allergies  Allergen Reactions  . Atorvastatin     All statins make him cough    Current Outpatient Prescriptions  Medication Sig Dispense Refill  . allopurinol (ZYLOPRIM) 100 MG tablet Take 1 tablet (100 mg total) by mouth 3 (three) times daily. 270 tablet 3  . ALPRAZolam (XANAX) 0.5 MG tablet TAKE ONE TABLET TWICE DAILY AS NEEDED 180 tablet 0  . aspirin EC 81 MG tablet Take 81 mg by mouth daily.    . carvedilol (COREG) 25 MG tablet Take 1 tablet (25 mg total) by mouth 2 (two) times daily. 180 tablet 3  . cetirizine (ZYRTEC) 10 MG tablet Take 1 tablet (10 mg total) by mouth daily. 30 tablet 11  . cyclobenzaprine (FLEXERIL) 10 MG tablet Take 1  tablet (10 mg total) by mouth 3 (three) times daily as needed for muscle spasms. 270 tablet 1  . famotidine (PEPCID) 20 MG tablet Take 1 tablet (20 mg total) by mouth daily. 30 tablet 11  . Febuxostat (ULORIC) 80 MG TABS TAKE ONE (1) TABLET EACH DAY 90 each 3  . hydrochlorothiazide (HYDRODIURIL) 25 MG tablet Take 1 tablet (25 mg total) by mouth 2 (two) times daily. 180 tablet 3  . HYDROcodone-acetaminophen (NORCO) 10-325 MG tablet Take 1 tablet by mouth every 8 (eight) hours as needed. 120 tablet 0  . liraglutide (VICTOZA) 18 MG/3ML SOPN INJECT 0.3ML SQ DAILY 27 mL 3  . LYRICA 200 MG capsule Take 1 capsule (200 mg total) by mouth 2 (two) times daily. 60 capsule 5  . metoprolol tartrate (LOPRESSOR) 25 MG tablet Take 1 tablet (25 mg total) by mouth 2 (two) times daily. 180 tablet 0  . nitroGLYCERIN (NITROSTAT) 0.4 MG SL tablet Place 1 tablet (0.4 mg total) under the tongue every 5 (five) minutes as needed for chest pain. 25 tablet 3  . pioglitazone (ACTOS) 45 MG tablet TAKE ONE (1) TABLET EACH DAY 90 tablet 3  . pravastatin (PRAVACHOL) 40 MG tablet TAKE ONE (1) TABLET EACH DAY 90 tablet 3  . prazosin (MINIPRESS) 1 MG capsule Take 1 mg by mouth at bedtime.    . predniSONE (DELTASONE) 10 MG tablet Take 10 mg by mouth daily.    . VENTOLIN HFA 108 (90 Base) MCG/ACT inhaler Inhale 2 puffs into the lungs every 6 (six) hours as needed for wheezing or shortness of breath. 18 g  6   No current facility-administered medications for this visit.     Past Medical History:  Diagnosis Date  . Allergy   . Anxiety   . Diabetes mellitus without complication ([place])   . Heart disease   . Hyperlipidemia   . Hypertension     Past Surgical History:  Procedure Laterality Date  . CHOLECYSTECTOMY    . HEART STENT      Social History   Social History  . Marital status: Single    Spouse name: N/A  . Number of children: N/A  . Years of education: N/A   Occupational History  . Not on file.   Social  History Main Topics  . Smoking status: Current Every Day Smoker    Packs/day: 1.50  . Smokeless tobacco: Never Used  . Alcohol use No  . Drug use: No  . Sexual activity: Not on file   Other Topics Concern  . Not on file   Social History Narrative  . No narrative on file     Vitals:   12/14/16 1515  BP: 120/60  Pulse: 100  SpO2: 97%  Weight: 186 lb (84.4 kg)  Height: 5\' 5"  (1.651 m)    Wt Readings from Last 3 Encounters:  12/14/16 186 lb (84.4 kg)  11/22/16 186 lb (84.4 kg)  11/03/16 186 lb 6.4 oz (84.6 kg)     PHYSICAL EXAM General: NAD HEENT: Normal. Neck: No JVD, no thyromegaly. Lungs: Clear to auscultation bilaterally with normal respiratory effort. CV: Nondisplaced PMI.  Regular rate and rhythm, normal S1/S2, no S3/S4, no murmur. No pretibial or periankle edema.     Abdomen: Soft, nontender, no distention.  Neurologic: Alert and oriented.  Psych: Normal affect. Skin: Normal. Musculoskeletal: No gross deformities.    ECG: Most recent ECG reviewed.   Labs: Lab Results  Component Value Date/Time   K 4.5 11/03/2016 10:36 AM   BUN 18 11/03/2016 10:36 AM   CREATININE 1.81 (H) 11/03/2016 10:36 AM   ALT 6 11/03/2016 10:36 AM   TSH 2.170 04/25/2016 11:03 AM   HGB 11.3 (L) 11/03/2016 10:36 AM     Lipids: Lab Results  Component Value Date/Time   LDLCALC 82 11/03/2016 10:36 AM   CHOL 155 11/03/2016 10:36 AM   TRIG 246 (H) 11/03/2016 10:36 AM   HDL 24 (L) 11/03/2016 10:36 AM       ASSESSMENT AND PLAN:  1. Coronary artery disease with history of drug-eluting stent placement to the RCA and angina pectoris with abnormal nuclear stress test: He continues to experience exertional chest tightness, exertional dyspnea, and fatigue. Currently on ASA, Plavix, Coreg, and statin.  I again informed him to use sublingual nitroglycerin as needed.  I will arrange for coronary angiography. Risks and benefits of cardiac catheterization have been discussed with the  patient.  These include bleeding, infection, kidney damage, stroke, heart attack, death.  The patient understands these risks and is willing to proceed.  2. Hypertension: Controlled. No changes. Given concurrent IDDM, consider switching HCTZ to ACEI as thiazide will also contribute to hyperglycemia. Will need to carefully monitor renal function.  3. Hyperlipidemia: On pravastatin. Lipids 11/03/16-total cholesterol 155, triglycerides mildly elevated at 246, HDL low at 24, LDL 82. LDL previously 106 on 08/02/16.  4. CKD stage III: GFR 44 on 11/03/16.  5. Claudication with PVD: He has a long history of tobacco abuse. ABIs showed aortoiliac atherosclerosis with greater than 50% stenosis of the bilateral external iliac arteries, left greater than right, 50-74%  stenosis of the mid and distal right SFA, 30-49% stenosis of the left mid SFA, and 50-74% stenosis of the left distal SFA. There was two-vessel runoff bilaterally with both posterior tibial arteries being occluded. He needs tobacco cessation. I will make a referral to one of my colleagues who specializes in peripheral vascular disease. Continue aspirin and statin.  6. Tobacco abuse disorder: Chronic. Needs cessation given coronary disease and peripheral vascular disease. He is reluctant to consider quitting.     Disposition: Follow up 1 month.  Time spent: 40 minutes, of which greater than 50% was spent reviewing symptoms, relevant blood tests and studies, and discussing management plan with the patient.    Kate Sable, M.D., F.A.C.C.

## 2016-12-21 ENCOUNTER — Inpatient Hospital Stay (HOSPITAL_COMMUNITY)
Admission: RE | Disposition: A | Discharge status: Home / Self Care | Payer: Self-pay | Source: Home / Self Care | Attending: Cardiovascular Disease

## 2016-12-21 ENCOUNTER — Encounter (HOSPITAL_COMMUNITY): Payer: Self-pay | Admitting: Cardiovascular Disease

## 2016-12-21 ENCOUNTER — Other Ambulatory Visit (HOSPITAL_COMMUNITY): Payer: Medicare Other

## 2016-12-21 DIAGNOSIS — N183 Chronic kidney disease, stage 3 (moderate): Secondary | ICD-10-CM

## 2016-12-21 DIAGNOSIS — I200 Unstable angina: Secondary | ICD-10-CM

## 2016-12-21 DIAGNOSIS — E785 Hyperlipidemia, unspecified: Secondary | ICD-10-CM

## 2016-12-21 HISTORY — PX: CORONARY STENT INTERVENTION: CATH118234

## 2016-12-21 LAB — GLUCOSE, CAPILLARY
GLUCOSE-CAPILLARY: 122 mg/dL — AB (ref 65–99)
GLUCOSE-CAPILLARY: 134 mg/dL — AB (ref 65–99)
Glucose-Capillary: 103 mg/dL — ABNORMAL HIGH (ref 65–99)
Glucose-Capillary: 150 mg/dL — ABNORMAL HIGH (ref 65–99)

## 2016-12-21 LAB — BASIC METABOLIC PANEL
Anion gap: 8 (ref 5–15)
BUN: 17 mg/dL (ref 6–20)
CHLORIDE: 107 mmol/L (ref 101–111)
CO2: 20 mmol/L — ABNORMAL LOW (ref 22–32)
CREATININE: 1.49 mg/dL — AB (ref 0.61–1.24)
Calcium: 8.9 mg/dL (ref 8.9–10.3)
GFR calc Af Amer: 55 mL/min — ABNORMAL LOW (ref >60)
GFR, EST NON AFRICAN AMERICAN: 47 mL/min — AB (ref >60)
Glucose, Bld: 144 mg/dL — ABNORMAL HIGH (ref 65–99)
Potassium: 4 mmol/L (ref 3.5–5.1)
Sodium: 135 mmol/L (ref 135–145)

## 2016-12-21 LAB — CBC
HCT: 33.5 % — ABNORMAL LOW (ref 39.0–52.0)
Hemoglobin: 10.8 g/dL — ABNORMAL LOW (ref 13.0–17.0)
MCH: 28.6 pg (ref 26.0–34.0)
MCHC: 32.2 g/dL (ref 30.0–36.0)
MCV: 88.6 fL (ref 78.0–100.0)
PLATELETS: 237 10*3/uL (ref 150–400)
RBC: 3.78 MIL/uL — AB (ref 4.22–5.81)
RDW: 14 % (ref 11.5–15.5)
WBC: 6.7 10*3/uL (ref 4.0–10.5)

## 2016-12-21 LAB — POCT ACTIVATED CLOTTING TIME: ACTIVATED CLOTTING TIME: 389 s

## 2016-12-21 SURGERY — CORONARY STENT INTERVENTION
Anesthesia: LOCAL

## 2016-12-21 MED ORDER — BIVALIRUDIN TRIFLUOROACETATE 250 MG IV SOLR
INTRAVENOUS | Status: AC
  Filled 2016-12-21: qty 250

## 2016-12-21 MED ORDER — MIDAZOLAM HCL 2 MG/2ML IJ SOLN
INTRAMUSCULAR | Status: DC | PRN
  Administered 2016-12-21: 2 mg via INTRAVENOUS

## 2016-12-21 MED ORDER — SODIUM CHLORIDE 0.9% FLUSH: 3.0000 mL | Freq: Two times a day (BID) | INTRAVENOUS | Status: DC

## 2016-12-21 MED ORDER — FENTANYL CITRATE (PF) 100 MCG/2ML IJ SOLN
INTRAMUSCULAR | Status: AC
  Filled 2016-12-21: qty 2

## 2016-12-21 MED ORDER — BIVALIRUDIN BOLUS VIA INFUSION - CUPID
INTRAVENOUS | Status: DC | PRN
  Administered 2016-12-21: 60.75 mg via INTRAVENOUS

## 2016-12-21 MED ORDER — SODIUM CHLORIDE 0.9 % IV SOLN
INTRAVENOUS | Status: DC | PRN
  Administered 2016-12-21: 1.75 mg/kg/h via INTRAVENOUS

## 2016-12-21 MED ORDER — SODIUM CHLORIDE 0.9% FLUSH
3.0000 mL | Freq: Two times a day (BID) | INTRAVENOUS | Status: DC
  Administered 2016-12-21: 3 mL via INTRAVENOUS

## 2016-12-21 MED ORDER — MIDAZOLAM HCL 2 MG/2ML IJ SOLN
INTRAMUSCULAR | Status: AC
  Filled 2016-12-21: qty 2

## 2016-12-21 MED ORDER — SODIUM CHLORIDE 0.9 % IV SOLN: 250.0000 mL | INTRAVENOUS | Status: DC | PRN

## 2016-12-21 MED ORDER — SODIUM CHLORIDE 0.9 % WEIGHT BASED INFUSION: 1.0000 mL/kg/h | INTRAVENOUS | Status: DC

## 2016-12-21 MED ORDER — SODIUM CHLORIDE 0.9% FLUSH: 3.0000 mL | INTRAVENOUS | Status: DC | PRN

## 2016-12-21 MED ORDER — SODIUM CHLORIDE 0.9 % IV SOLN: INTRAVENOUS | Status: DC

## 2016-12-21 MED ORDER — ASPIRIN 81 MG PO CHEW
81.0000 mg | CHEWABLE_TABLET | ORAL | Status: AC
  Administered 2016-12-21: 81 mg via ORAL
  Filled 2016-12-21: qty 1

## 2016-12-21 MED ORDER — HYDRALAZINE HCL 20 MG/ML IJ SOLN: 5.0000 mg | INTRAMUSCULAR | Status: DC | PRN

## 2016-12-21 MED ORDER — LIDOCAINE HCL (PF) 1 % IJ SOLN
INTRAMUSCULAR | Status: DC | PRN
  Administered 2016-12-21: 15 mL

## 2016-12-21 MED ORDER — IOPAMIDOL (ISOVUE-370) INJECTION 76%
INTRAVENOUS | Status: DC | PRN
  Administered 2016-12-21: 65 mL via INTRA_ARTERIAL

## 2016-12-21 MED ORDER — IOPAMIDOL (ISOVUE-370) INJECTION 76%
INTRAVENOUS | Status: AC
  Filled 2016-12-21: qty 100

## 2016-12-21 MED ORDER — FENTANYL CITRATE (PF) 100 MCG/2ML IJ SOLN
INTRAMUSCULAR | Status: DC | PRN
  Administered 2016-12-21: 50 ug via INTRAVENOUS

## 2016-12-21 MED ORDER — LABETALOL HCL 5 MG/ML IV SOLN: 10.0000 mg | INTRAVENOUS | Status: DC | PRN

## 2016-12-21 MED ORDER — NITROGLYCERIN 1 MG/10 ML FOR IR/CATH LAB
INTRA_ARTERIAL | Status: AC
  Filled 2016-12-21: qty 10

## 2016-12-21 MED ORDER — LIDOCAINE HCL 2 % IJ SOLN
INTRAMUSCULAR | Status: AC
  Filled 2016-12-21: qty 10

## 2016-12-21 MED ORDER — HEPARIN (PORCINE) IN NACL 2-0.9 UNIT/ML-% IJ SOLN
INTRAMUSCULAR | Status: AC | PRN
  Administered 2016-12-21: 1000 mL

## 2016-12-21 MED ORDER — SODIUM CHLORIDE 0.9 % WEIGHT BASED INFUSION
3.0000 mL/kg/h | INTRAVENOUS | Status: DC
  Administered 2016-12-21: 3 mL/kg/h via INTRAVENOUS

## 2016-12-21 MED ORDER — HEPARIN (PORCINE) IN NACL 2-0.9 UNIT/ML-% IJ SOLN
INTRAMUSCULAR | Status: AC
  Filled 2016-12-21: qty 1000

## 2016-12-21 MED FILL — Lidocaine HCl Local Inj 2%: INTRAMUSCULAR | Qty: 10 | Status: AC

## 2016-12-21 SURGICAL SUPPLY — 14 items
BALLN EMERGE MR 2.0X12 (BALLOONS) ×2
BALLN SAPPHIRE ~~LOC~~ 2.75X15 (BALLOONS) ×2 IMPLANT
BALLOON EMERGE MR 2.0X12 (BALLOONS) ×1 IMPLANT
CATH LAUNCHER 6FR AL1 (CATHETERS) ×1 IMPLANT
CATHETER LAUNCHER 6FR AL1 (CATHETERS) ×2
KIT ENCORE 26 ADVANTAGE (KITS) ×4 IMPLANT
KIT HEART LEFT (KITS) ×2 IMPLANT
PACK CARDIAC CATHETERIZATION (CUSTOM PROCEDURE TRAY) ×2 IMPLANT
SHEATH PINNACLE 6F 10CM (SHEATH) ×2 IMPLANT
STENT SYNERGY DES 2.5X28 (Permanent Stent) ×2 IMPLANT
TRANSDUCER W/STOPCOCK (MISCELLANEOUS) ×2 IMPLANT
TUBING CIL FLEX 10 FLL-RA (TUBING) ×2 IMPLANT
WIRE COUGAR XT STRL 190CM (WIRE) ×2 IMPLANT
WIRE EMERALD 3MM-J .035X150CM (WIRE) ×2 IMPLANT

## 2016-12-21 NOTE — Progress Notes (Signed)
Site area: right groin  Site Prior to Removal:  Level 0  Pressure Applied For 20 MINUTES    Minutes Beginning at 1220  Manual:   Yes.    Patient Status During Pull:  Stable   Post Pull Groin Site:  Level 0  Post Pull Instructions Given:  Yes.    Post Pull Pulses Present:  Yes.    Dressing Applied:  Yes.    Comments:  Rechecked site frequently during shift with no change in assessment noted

## 2016-12-21 NOTE — Progress Notes (Signed)
Pt c/o chest pain 8/10 @4 :05 am. V/S stable. Ntg SL x 2 doses given with relief. EKG done with no changes. Dr Elson Areas informed. Will continue to monitor pt.

## 2016-12-21 NOTE — Progress Notes (Signed)
The patient has been seen in conjunction with Vin Bhagat, PAC. All aspects of care have been considered and discussed. The patient has been personally interviewed, examined, and all clinical data has been reviewed.   Discussed interventional options with Dr. Angelena Form prior to PCI this morning. Decided to fix the right coronary since the circumflex is collateralized. He is still significant angina the circumflex would be attempted at a second setting because of CKD and contrast issues.  Plan to ambulate later today and in a.m. with cardiac rehabilitation. If no significant angina, likely discharge and clinical follow-up to determine if further interventional treatment was necessary.   Progress Note  Patient Name: Tommy Douglas Date of Encounter: 12/21/2016  Primary Cardiologist: Herminio Commons, MD PV: Dr. Gwenlyn Found (has appointment 12/27/16)  Subjective   Feeling well. No chest pain, sob or palpitations.   Inpatient Medications    Scheduled Meds: . allopurinol  100 mg Oral TID  . aspirin  81 mg Oral Pre-Cath  . aspirin EC  81 mg Oral QHS  . carvedilol  25 mg Oral BID  . clopidogrel  75 mg Oral Daily  . febuxostat  80 mg Oral QHS  . liraglutide  1.8 mg Subcutaneous Daily  . metoprolol tartrate  25 mg Oral BID  . pioglitazone  45 mg Oral QPM  . pravastatin  40 mg Oral QPM  . sodium chloride flush  3 mL Intravenous Q12H  . sodium chloride flush  3 mL Intravenous Q12H   Continuous Infusions: . sodium chloride    . sodium chloride    . sodium chloride     Followed by  . sodium chloride     PRN Meds: sodium chloride, sodium chloride, acetaminophen, ALPRAZolam, nitroGLYCERIN, ondansetron (ZOFRAN) IV, sodium chloride flush, sodium chloride flush   Vital Signs    Vitals:   12/20/16 1900 12/20/16 1940 12/20/16 2000 12/21/16 0405  BP: 139/68 139/68 (!) 157/74 131/69  Pulse: 91 94  82  Resp: 19 (!) 22 17 16   Temp:  98.5 F (36.9 C)  98.1 F (36.7 C)  TempSrc:  Oral   Oral  SpO2: 98% 100% 100% 98%  Weight:    178 lb 9.2 oz (81 kg)  Height:        Intake/Output Summary (Last 24 hours) at 12/21/16 0748 Last data filed at 12/20/16 2300  Gross per 24 hour  Intake              790 ml  Output             1500 ml  Net             -710 ml   Filed Weights   12/20/16 0919 12/21/16 0405  Weight: 185 lb (83.9 kg) 178 lb 9.2 oz (81 kg)    Telemetry    SR - Personally Reviewed  ECG    SR with 1st degree AV block - Personally Reviewed  Physical Exam   GEN: No acute distress.   Neck: No JVD Cardiac: RRR, no murmurs, rubs, or gallops. R radial cath site stable.  Respiratory: Clear to auscultation bilaterally. GI: Soft, nontender, non-distended  MS: No edema; No deformity. Neuro:  Nonfocal  Psych: Normal affect   Labs    Chemistry Recent Labs Lab 12/21/16 0221  NA 135  K 4.0  CL 107  CO2 20*  GLUCOSE 144*  BUN 17  CREATININE 1.49*  CALCIUM 8.9  GFRNONAA 47*  GFRAA 55*  ANIONGAP 8  Hematology Recent Labs Lab 12/21/16 0221  WBC 6.7  RBC 3.78*  HGB 10.8*  HCT 33.5*  MCV 88.6  MCH 28.6  MCHC 32.2  RDW 14.0  PLT 237     Radiology    No results found.  Cardiac Studies   LEFT HEART CATH AND CORONARY ANGIOGRAPHY 12/20/16  Conclusion     Ost RCA to Mid RCA lesion, 0 %stenosed.  Prox RCA lesion, 40 %stenosed.  Mid RCA to Dist RCA lesion, 90 %stenosed.  Dist RCA lesion, 99 %stenosed.  Ost RPDA lesion, 99 %stenosed.  Prox Cx to Mid Cx lesion, 99 %stenosed.  Ost 3rd Mrg to 3rd Mrg lesion, 80 %stenosed.  Ost LAD to Prox LAD lesion, 10 %stenosed.  Prox LAD to Mid LAD lesion, 40 %stenosed.  Dist LAD lesion, 50 %stenosed.   1. Triple vessel CAD 2. The LAD is a large caliber vessel that courses to the apex. The proximal vessel appears to have a stent but this could be heavy calcification. (No previous cath records available). The mid and distal vessel has mild to moderate non-obstructive disease.  3. The  Circumflex has a sub-total occlusion in the proximal segment and fills distally from right to left and left to left collaterals.  4. The RCA is a large dominant vessel with a patent proximal stent. The stented segment has focal moderate restenosis. The mid vessel has severe stenosis. The small to moderate caliber PDA and posterolateral artery have severe stenosis.  5. Normal filling pressures.   Recommendations: He has stage 3 CKD. He has severe CAD. I think we should consider PCI of the RCA and possible the Circumflex artery. Given his renal dysfunction, will admit and hydrate today and plan PCI later this week. Continue ASA/Plavix.    Diagnostic Diagram        Patient Profile     66 y.o. male with hx of CAD, HTN, HLD, DM, CKD stage III and tobacco abuse presented for scheduled cath.   Nuclear stress test 11/29/16 for chest showed ischemia involving the basal inferior, basal inferolateral, mid inferior, mid inferolateral, apical inferior and apical lateral myocardial regions.   ABIs showed aortoiliac atherosclerosis with greater than 50% stenosis of the bilateral external iliac arteries, left greater than right, 50-74% stenosis of the mid and distal right SFA, 30-49% stenosis of the left mid SFA, and 50-74% stenosis of the left distal SFA. There was two-vessel runoff bilaterally with both posterior tibial arteries being occluded.  Assessment & Plan    1. CAD - detailed cath report as above. Plan for PCI of RCA and possible Cx today. Scr improved overnight with hydration.  - Continue ASA, Plavix, statin and coreg. Will discontinue metoprolol.   2. HLD - 11/03/2016: Cholesterol, Total 155; HDL 24; LDL Calculated 82; Triglycerides 246  - Statin intolerance. Continue Pravastatin. Consider lipid clinic referral as outpatient. Given concurrent IDDM, consider switching HCTZ to ACEI at discharge.  3. HTN - BP stable. Stop metoprolol as he is on coreg.   4. PVD - Follow up with Dr. Gwenlyn Found as  schedule.    5. CKD stage III - Scr improved with hydration  6. Tobacco abuse - advised cessation. Education given.   7. DM - Hold home meds today.    For questions or updates, please contact [facility] Please consult [email or web address] for contact info under Cardiology/STEMI.      Signed, Leanor Kail, PA  12/21/2016, 7:48 AM

## 2016-12-21 NOTE — Care Management Note (Addendum)
Case Management Note  Patient Details  Name: THEODOR MUSTIN MRN: [id number] Date of Birth: 1951/02/18  Subjective/Objective:     From home, stage 3 CKD, severe CAD, Per cath note consider PCI of RCA and possible the cx, given renal dysfunction will , hydrate today, plan pci later , for dc in am.  On plavix and asa.                Action/Plan: NCM will follow for dc needs.   Expected Discharge Date:                  Expected Discharge Plan:  Home/Self Care  In-House Referral:     Discharge planning Services  CM Consult  Post Acute Care Choice:    Choice offered to:     DME Arranged:    DME Agency:     HH Arranged:    HH Agency:     Status of Service:  In process, will continue to follow  If discussed at Long Length of Stay Meetings, dates discussed:    Additional Comments:  Zenon Mayo, RN 12/21/2016, 11:07 AM

## 2016-12-21 NOTE — Interval H&P Note (Signed)
History and Physical Interval Note:  12/21/2016 9:13 AM  Tommy Douglas  has presented today for PCI with the diagnosis of CAD with unstable angina The various methods of treatment have been discussed with the patient and family. After consideration of risks, benefits and other options for treatment, the patient has consented to  Procedure(s): CORONARY STENT INTERVENTION (N/A) as a surgical intervention .  The patient's history has been reviewed, patient examined, no change in status, stable for surgery.  I have reviewed the patient's chart and labs.  Questions were answered to the patient's satisfaction.    Cath Lab Visit (complete for each Cath Lab visit)  Clinical Evaluation Leading to the Procedure:   ACS: No.  Non-ACS:    Anginal Classification: CCS III  Anti-ischemic medical therapy: Maximal Therapy (2 or more classes of medications)  Non-Invasive Test Results: Intermediate risk stress test  Prior CABG: No previous CABG         Lauree Chandler

## 2016-12-22 ENCOUNTER — Inpatient Hospital Stay (HOSPITAL_COMMUNITY): Payer: Medicare Other

## 2016-12-22 DIAGNOSIS — I10 Essential (primary) hypertension: Secondary | ICD-10-CM

## 2016-12-22 DIAGNOSIS — I2510 Atherosclerotic heart disease of native coronary artery without angina pectoris: Secondary | ICD-10-CM

## 2016-12-22 DIAGNOSIS — N182 Chronic kidney disease, stage 2 (mild): Secondary | ICD-10-CM

## 2016-12-22 DIAGNOSIS — I739 Peripheral vascular disease, unspecified: Secondary | ICD-10-CM | POA: Diagnosis present

## 2016-12-22 DIAGNOSIS — Z9861 Coronary angioplasty status: Secondary | ICD-10-CM

## 2016-12-22 DIAGNOSIS — E1122 Type 2 diabetes mellitus with diabetic chronic kidney disease: Secondary | ICD-10-CM

## 2016-12-22 LAB — CBC
HEMATOCRIT: 34.7 % — AB (ref 39.0–52.0)
Hemoglobin: 11.2 g/dL — ABNORMAL LOW (ref 13.0–17.0)
MCH: 28.6 pg (ref 26.0–34.0)
MCHC: 32.3 g/dL (ref 30.0–36.0)
MCV: 88.5 fL (ref 78.0–100.0)
Platelets: 248 10*3/uL (ref 150–400)
RBC: 3.92 MIL/uL — ABNORMAL LOW (ref 4.22–5.81)
RDW: 14.3 % (ref 11.5–15.5)
WBC: 7 10*3/uL (ref 4.0–10.5)

## 2016-12-22 LAB — ECHOCARDIOGRAM COMPLETE
Height: 66 in
WEIGHTICAEL: 2595.2 [oz_av]

## 2016-12-22 LAB — BASIC METABOLIC PANEL
Anion gap: 8 (ref 5–15)
BUN: 15 mg/dL (ref 6–20)
CO2: 24 mmol/L (ref 22–32)
CREATININE: 1.42 mg/dL — AB (ref 0.61–1.24)
Calcium: 9 mg/dL (ref 8.9–10.3)
Chloride: 104 mmol/L (ref 101–111)
GFR calc Af Amer: 58 mL/min — ABNORMAL LOW (ref >60)
GFR, EST NON AFRICAN AMERICAN: 50 mL/min — AB (ref >60)
Glucose, Bld: 140 mg/dL — ABNORMAL HIGH (ref 65–99)
POTASSIUM: 3.8 mmol/L (ref 3.5–5.1)
SODIUM: 136 mmol/L (ref 135–145)

## 2016-12-22 LAB — GLUCOSE, CAPILLARY: Glucose-Capillary: 161 mg/dL — ABNORMAL HIGH (ref 65–99)

## 2016-12-22 MED ORDER — ISOSORBIDE MONONITRATE ER 30 MG PO TB24: 30.0000 mg | ORAL_TABLET | Freq: Every day | ORAL | 3 refills | Status: DC

## 2016-12-22 MED ORDER — PRAVASTATIN SODIUM 40 MG PO TABS: 40.0000 mg | ORAL_TABLET | Freq: Every evening | ORAL | Status: DC

## 2016-12-22 MED ORDER — LIRAGLUTIDE 18 MG/3ML ~~LOC~~ SOPN: 1.8000 mg | PEN_INJECTOR | Freq: Every day | SUBCUTANEOUS | Status: DC

## 2016-12-22 MED ORDER — PANTOPRAZOLE SODIUM 40 MG PO TBEC
40.0000 mg | DELAYED_RELEASE_TABLET | Freq: Every day | ORAL | Status: DC
  Administered 2016-12-22: 40 mg via ORAL
  Filled 2016-12-22: qty 1

## 2016-12-22 MED ORDER — PANTOPRAZOLE SODIUM 40 MG PO TBEC: 40.0000 mg | DELAYED_RELEASE_TABLET | Freq: Every day | ORAL | 5 refills | Status: DC

## 2016-12-22 MED ORDER — HYDROCHLOROTHIAZIDE 25 MG PO TABS: 25.0000 mg | ORAL_TABLET | Freq: Every day | ORAL | 3 refills | Status: DC

## 2016-12-22 MED ORDER — FEBUXOSTAT 80 MG PO TABS: 80.0000 mg | ORAL_TABLET | Freq: Every day | ORAL | Status: DC

## 2016-12-22 MED ORDER — LYRICA 200 MG PO CAPS: 200.0000 mg | ORAL_CAPSULE | Freq: Two times a day (BID) | ORAL | Status: DC | PRN

## 2016-12-22 MED ORDER — CYCLOBENZAPRINE HCL 10 MG PO TABS: 10.0000 mg | ORAL_TABLET | Freq: Three times a day (TID) | ORAL | Status: DC | PRN

## 2016-12-22 MED ORDER — PIOGLITAZONE HCL 45 MG PO TABS: 45.0000 mg | ORAL_TABLET | Freq: Every evening | ORAL | Status: DC

## 2016-12-22 MED ORDER — ISOSORBIDE MONONITRATE ER 30 MG PO TB24
30.0000 mg | ORAL_TABLET | Freq: Every day | ORAL | Status: DC
  Administered 2016-12-22: 30 mg via ORAL
  Filled 2016-12-22: qty 1

## 2016-12-22 MED ORDER — ACETAMINOPHEN 325 MG PO TABS: 650.0000 mg | ORAL_TABLET | ORAL | Status: DC | PRN

## 2016-12-22 MED ORDER — ALPRAZOLAM 0.5 MG PO TABS: ORAL_TABLET | ORAL | 0 refills | Status: DC

## 2016-12-22 MED ORDER — CLOPIDOGREL BISULFATE 75 MG PO TABS: 75.0000 mg | ORAL_TABLET | Freq: Every day | ORAL | Status: DC

## 2016-12-22 MED ORDER — CETIRIZINE HCL 10 MG PO TABS: 10.0000 mg | ORAL_TABLET | Freq: Every day | ORAL | Status: DC | PRN

## 2016-12-22 MED FILL — Lidocaine HCl Local Inj 2%: INTRAMUSCULAR | Qty: 10 | Status: AC

## 2016-12-22 MED FILL — Nitroglycerin IV Soln 100 MCG/ML in D5W: INTRA_ARTERIAL | Qty: 10 | Status: AC

## 2016-12-22 NOTE — Discharge Instructions (Signed)
Coronary Angiogram With Stent, Care After °This sheet gives you information about how to care for yourself after your procedure. Your health care provider may also give you more specific instructions. If you have problems or questions, contact your health care provider. °What can I expect after the procedure? °After your procedure, it is common to have: °· Bruising in the area where a small, thin tube (catheter) was inserted. This usually fades within 1-2 weeks. °· Blood collecting in the tissue (hematoma) that may be painful to the touch. It should usually decrease in size and tenderness within 1-2 weeks. ° °Follow these instructions at home: °Insertion area care °· Do not take baths, swim, or use a hot tub until your health care provider approves. °· You may shower 24-48 hours after the procedure or as directed by your health care provider. °· Follow instructions from your health care provider about how to take care of your incision. Make sure you: °? Wash your hands with soap and water before you change your bandage (dressing). If soap and water are not available, use hand sanitizer. °? Change your dressing as told by your health care provider. °? Leave stitches (sutures), skin glue, or adhesive strips in place. These skin closures may need to stay in place for 2 weeks or longer. If adhesive strip edges start to loosen and curl up, you may trim the loose edges. Do not remove adhesive strips completely unless your health care provider tells you to do that. °· Remove the bandage (dressing) and gently wash the catheter insertion site with plain soap and water. °· Pat the area dry with a clean towel. Do not rub the area, because that may cause bleeding. °· Do not apply powder or lotion to the incision area. °· Check your incision area every day for signs of infection. Check for: °? More redness, swelling, or pain. °? More fluid or blood. °? Warmth. °? Pus or a bad smell. °Activity °· Do not drive for 24 hours if you  were given a medicine to help you relax (sedative). °· Do not lift anything that is heavier than 10 lb (4.5 kg) for 5 days after your procedure or as directed by your health care provider. °· Ask your health care provider when it is okay for you: °? To return to work or school. °? To resume usual physical activities or sports. °? To resume sexual activity. °Eating and drinking °· Eat a heart-healthy diet. This should include plenty of fresh fruits and vegetables. °· Avoid the following types of food: °? Food that is high in salt. °? Canned or highly processed food. °? Food that is high in saturated fat or sugar. °? Fried food. °· Limit alcohol intake to no more than 1 drink a day for non-pregnant women and 2 drinks a day for men. One drink equals 12 oz of beer, 5 oz of wine, or 1½ oz of hard liquor. °Lifestyle °· Do not use any products that contain nicotine or tobacco, such as cigarettes and e-cigarettes. If you need help quitting, ask your health care provider. °· Take steps to manage and control your weight. °· Get regular exercise. °· Manage your blood pressure. °· Manage other health problems, such as diabetes. °General instructions °· Take over-the-counter and prescription medicines only as told by your health care provider. Blood thinners may be prescribed after your procedure to improve blood flow through the stent. °· If you need an MRI after your heart stent has been placed, be   sure to tell the health care provider who orders the MRI that you have a heart stent. °· Keep all follow-up visits as directed by your health care provider. This is important. °Contact a health care provider if: °· You have a fever. °· You have chills. °· You have increased bleeding from the catheter insertion area. Hold pressure on the area. °Get help right away if: °· You develop chest pain or shortness of breath. °· You feel faint or you pass out. °· You have unusual pain at the catheter insertion area. °· You have redness,  warmth, or swelling at the catheter insertion area. °· You have drainage (other than a small amount of blood on the dressing) from the catheter insertion area. °· The catheter insertion area is bleeding, and the bleeding does not stop after 30 minutes of holding steady pressure on the area. °· You develop bleeding from any other place, such as from your rectum. There may be bright red blood in your urine or stool, or it may appear as black, tarry stool. °This information is not intended to replace advice given to you by your health care provider. Make sure you discuss any questions you have with your health care provider. °Document Released: 09/30/2004 Document Revised: 12/09/2015 Document Reviewed: 12/09/2015 °Elsevier Interactive Patient Education © 2018 [employer] ° °

## 2016-12-22 NOTE — Discharge Summary (Signed)
The patient has been seen in conjunction with Kerin Ransom, PAC. All aspects of care have been considered and discussed. The patient has been personally interviewed, examined, and all clinical data has been reviewed.   Femoral cath site unremarkable.  Kidney function stable.  RCA treated  With stent without complications.  High grade residual CFX has collaterals and if symptoms controlled with meds, no further intervention. Add Imdur and titrate.  PCI CFX is symptoms not controlled with meds.  Discharge Summary-  Patient ID: Tommy Douglas,  MRN: [id number], DOB/AGE: [birth year] 66 y.o.  Admit date: 12/20/2016 Discharge date: 12/22/2016  Primary Care Provider: Terald Sleeper, PA-C Primary Cardiologist: Dr Bronson Ing, Dr Carney Bern  Discharge Diagnoses Principal Problem:   Unstable angina [facility]) Active Problems:   Essential hypertension   CAD S/P percutaneous coronary angioplasty   Type 2 diabetes mellitus with stage 2 chronic kidney disease, without long-term current use of insulin ([place])   Hyperlipidemia associated with type 2 diabetes mellitus ([place])   PVD (peripheral vascular disease) ([place])    Procedures: Cath 12/20/16                        RCA PCI with DES 12/21/16   Hospital Course:  66 y.o.malewith a history of prior RCA PCI '04, HTN, IDDM, moderate PVD, HLD, and smoking, admitted for cath 12/20/16 after he was evaluated as an OP with a history of chest pain worrisome for angina. Cath 9/26 revealed pate pRCA stent and patent pLAD stent. He had a 90% mid-distal RCA stenosis with 99% PDA, PLA, ST CFX after the OM2, 80% OM2, 50% distal LAD. LV not done secondary to CRI but prior echo in 2014 showed normal LVF per Dr Bronson Ing. The pt was hydrated and brought back to the lab 12/21/16 for PCI. He had PCI to the RCA with DES. The PDA and PLA were felt to be too small for PCI. The CFX was small to moderate with evidence of collaterals and PCI was not attempted but could  be considered if medical Rx fails. The pt was seen by DR Tamala Julian on 9/29 and felt to be stable for discharge. We added Imdur 30 mg. His HCTZ was decreased to 25 mg daily. He'll need a BMP when seen in follow up. We will defer addition of an ACE or ARB to his primary cardiologist.    Discharge Vitals:  Blood pressure 138/67, pulse 86, temperature 98.3 F (36.8 C), temperature source Oral, resp. rate 16, height 5\' 6"  (1.676 m), weight 162 lb 3.2 oz (73.6 kg), SpO2 99 %.    Labs: Lab Results Last 24 Hours       Results for orders placed or performed during the hospital encounter of 12/20/16 (from the past 24 hour(s))  POCT Activated clotting time     Status: None   Collection Time: 12/21/16  9:39 AM  Result Value Ref Range   Activated Clotting Time 389 seconds  Glucose, capillary     Status: Abnormal   Collection Time: 12/21/16 12:38 PM  Result Value Ref Range   Glucose-Capillary 103 (H) 65 - 99 mg/dL   Comment 1 Notify RN    Comment 2 Document in Chart   Glucose, capillary     Status: Abnormal   Collection Time: 12/21/16  4:59 PM  Result Value Ref Range   Glucose-Capillary 134 (H) 65 - 99 mg/dL   Comment 1 Notify RN    Comment 2 Document in Chart  Glucose, capillary     Status: Abnormal   Collection Time: 12/21/16  9:33 PM  Result Value Ref Range   Glucose-Capillary 150 (H) 65 - 99 mg/dL  Basic metabolic panel     Status: Abnormal   Collection Time: 12/22/16  3:40 AM  Result Value Ref Range   Sodium 136 135 - 145 mmol/L   Potassium 3.8 3.5 - 5.1 mmol/L   Chloride 104 101 - 111 mmol/L   CO2 24 22 - 32 mmol/L   Glucose, Bld 140 (H) 65 - 99 mg/dL   BUN 15 6 - 20 mg/dL   Creatinine, Ser 1.42 (H) 0.61 - 1.24 mg/dL   Calcium 9.0 8.9 - 10.3 mg/dL   GFR calc non Af Amer 50 (L) >60 mL/min   GFR calc Af Amer 58 (L) >60 mL/min   Anion gap 8 5 - 15  CBC     Status: Abnormal   Collection Time: 12/22/16  3:40 AM  Result Value Ref Range   WBC 7.0 4.0 -  10.5 K/uL   RBC 3.92 (L) 4.22 - 5.81 MIL/uL   Hemoglobin 11.2 (L) 13.0 - 17.0 g/dL   HCT 34.7 (L) 39.0 - 52.0 %   MCV 88.5 78.0 - 100.0 fL   MCH 28.6 26.0 - 34.0 pg   MCHC 32.3 30.0 - 36.0 g/dL   RDW 14.3 11.5 - 15.5 %   Platelets 248 150 - 400 K/uL  Glucose, capillary     Status: Abnormal   Collection Time: 12/22/16  7:04 AM  Result Value Ref Range   Glucose-Capillary 161 (H) 65 - 99 mg/dL      Disposition:     Follow-up Information    Lorretta Harp, MD Follow up on 12/27/2016.   Specialties:  Cardiology, Radiology Why:  10:30 Contact information: [address] [place] [address] [phone]        Herminio Commons, MD Follow up on 01/11/2017.   Specialty:  Cardiology Why:  10:40 Contact information: [place] [address] [phone]           Discharge Medications:      Allergies as of 12/22/2016      Reactions   Atorvastatin    All statins make him cough   Statins Cough   Pt is currently taking pravastatin                            Medication List               STOP taking these medications            famotidine 20 MG tablet Commonly known as:  PEPCID    metoprolol tartrate 25 MG tablet Commonly known as:  LOPRESSOR                       TAKE these medications            acetaminophen 325 MG tablet Commonly known as:  TYLENOL Take 2 tablets (650 mg total) by mouth every 4 (four) hours as needed for headache or mild pain.    allopurinol 100 MG tablet Commonly known as:  ZYLOPRIM Take 1 tablet (100 mg total) by mouth 3 (three) times daily.    ALPRAZolam 0.5 MG tablet Commonly known as:  XANAX TAKE ONE TABLET TWICE DAILY AS NEEDED FOR ANXIETY    aspirin EC 81 MG tablet  Take 81 mg by mouth at bedtime.    carvedilol 25 MG tablet Commonly known as:  COREG Take 1 tablet (25 mg total) by mouth 2 (two) times daily.    cetirizine 10 MG tablet Commonly  known as:  ZYRTEC Take 1 tablet (10 mg total) by mouth daily as needed (for allergies.).    clopidogrel 75 MG tablet Commonly known as:  PLAVIX Take 1 tablet (75 mg total) by mouth at bedtime.    cyclobenzaprine 10 MG tablet Commonly known as:  FLEXERIL Take 1 tablet (10 mg total) by mouth 3 (three) times daily as needed for muscle spasms (typically scheduled at bedtime).    Febuxostat 80 MG Tabs Commonly known as:  ULORIC Take 1 tablet (80 mg total) by mouth at bedtime. TAKE ONE (1) TABLET EACH DAY What changed:  how much to take  how to take this  when to take this    hydrochlorothiazide 25 MG tablet Commonly known as:  HYDRODIURIL Take 1 tablet (25 mg total) by mouth daily. What changed:  when to take this    HYDROcodone-acetaminophen 10-325 MG tablet Commonly known as:  NORCO Take 1 tablet by mouth every 8 (eight) hours as needed.    liraglutide 18 MG/3ML Sopn Commonly known as:  VICTOZA Inject 0.3 mLs (1.8 mg total) into the skin daily. INJECT 0.3ML SQ DAILY What changed:  how much to take  how to take this  when to take this    LYRICA 200 MG capsule Generic drug:  pregabalin Take 1 capsule (200 mg total) by mouth 2 (two) times daily as needed (for pain.).    nitroGLYCERIN 0.4 MG SL tablet Commonly known as:  NITROSTAT Place 1 tablet (0.4 mg total) under the tongue every 5 (five) minutes as needed for chest pain.    pantoprazole 40 MG tablet Commonly known as:  PROTONIX Take 1 tablet (40 mg total) by mouth daily at 6 (six) AM.    pioglitazone 45 MG tablet Commonly known as:  ACTOS Take 1 tablet (45 mg total) by mouth every evening. TAKE ONE (1) TABLET EACH DAY What changed:  how much to take  how to take this  when to take this    pravastatin 40 MG tablet Commonly known as:  PRAVACHOL Take 1 tablet (40 mg total) by mouth every evening. TAKE ONE (1) TABLET EACH DAY What changed:  how much to take  how to take this  when to take  this    VENTOLIN HFA 108 (90 Base) MCG/ACT inhaler Generic drug:  albuterol Inhale 2 puffs into the lungs every 6 (six) hours as needed for wheezing or shortness of breath.                                         Discharge Care Instructions               Start     Ordered   12/23/16 0000  hydrochlorothiazide (HYDRODIURIL) 25 MG tablet  Daily    Question:  Supervising Provider  Answer:  Lorretta Harp   12/22/16 0730   12/22/16 0000  acetaminophen (TYLENOL) 325 MG tablet  Every 4 hours PRN    Question:  Supervising Provider  Answer:  Lorretta Harp   12/22/16 0730   12/22/16 0000  ALPRAZolam Duanne Moron) 0.5 MG tablet    Question:  Supervising Provider  Answer:  Quay Burow J   12/22/16 0730   12/22/16 0000  cetirizine (ZYRTEC) 10 MG tablet  Daily PRN    Question:  Supervising Provider  Answer:  Lorretta Harp   12/22/16 0730   12/22/16 0000  clopidogrel (PLAVIX) 75 MG tablet  Daily at bedtime    Question:  Supervising Provider  Answer:  Lorretta Harp   12/22/16 0730   12/22/16 0000  cyclobenzaprine (FLEXERIL) 10 MG tablet  3 times daily PRN    Question:  Supervising Provider  Answer:  Lorretta Harp   12/22/16 0730   12/22/16 0000  Febuxostat (ULORIC) 80 MG TABS  Daily at bedtime    Question:  Supervising Provider  Answer:  Lorretta Harp   12/22/16 0730   12/22/16 0000  liraglutide (VICTOZA) 18 MG/3ML SOPN  Daily    Question:  Supervising Provider  Answer:  Lorretta Harp   12/22/16 0730   12/22/16 0000  LYRICA 200 MG capsule  2 times daily PRN    Question:  Supervising Provider  Answer:  Lorretta Harp   12/22/16 0730   12/22/16 0000  pravastatin (PRAVACHOL) 40 MG tablet  Every evening    Question:  Supervising Provider  Answer:  Lorretta Harp   12/22/16 0730   12/22/16 0000  pioglitazone (ACTOS) 45 MG tablet  Every evening    Question:  Supervising Provider  Answer:  Lorretta Harp   12/22/16 0730     12/22/16 0000  pantoprazole (PROTONIX) 40 MG tablet  Daily    Question:  Supervising Provider  Answer:  Lorretta Harp   12/22/16 0730   12/22/16 0000  Amb Referral to Cardiac Rehabilitation    Question Answer Comment  Diagnosis: Coronary Stents   Diagnosis: PTCA      12/22/16 1157       Duration of Discharge Encounter: Greater than 30 minutes including physician time.  Angelena Form PA-C 12/22/2016 9:22 AM

## 2016-12-22 NOTE — Progress Notes (Cosign Needed Addendum)
Discharge Summary-  Patient ID: Tommy Douglas,  MRN: [id number], DOB/AGE: Mar 14, 1951 66 y.o.  Admit date: 12/20/2016 Discharge date: 12/22/2016  Primary Care Provider: Terald Sleeper, PA-C Primary Cardiologist: Dr Bronson Ing, Dr Carney Bern  Discharge Diagnoses Principal Problem:   Unstable angina [facility]) Active Problems:   Essential hypertension   CAD S/P percutaneous coronary angioplasty   Type 2 diabetes mellitus with stage 2 chronic kidney disease, without long-term current use of insulin (HCC)   Hyperlipidemia associated with type 2 diabetes mellitus ([place])   PVD (peripheral vascular disease) ([place])    Procedures: Cath 12/20/16                        RCA PCI with DES 12/21/16   Hospital Course:   66 y.o. male with a history of prior RCA PCI '04, HTN, IDDM, moderate PVD, HLD, and smoking, admitted for cath 12/20/16 after he was evaluated as an OP with a history of chest pain worrisome for angina. Cath 9/26 revealed pate pRCA stent and patent pLAD stent. He had a 90% mid-distal RCA stenosis with 99% PDA, PLA, ST CFX after the OM2, 80% OM2, 50% distal LAD. LV not done secondary to CRI but prior echo in 2014 showed normal LVF per Dr Bronson Ing. The pt was hydrated and brought back to the lab 12/21/16 for PCI. He had PCI to the RCA with DES. The PDA and PLA were felt to be too small for PCI. The CFX was small to moderate with evidence of collaterals and PCI was not attempted but could be considered if medical Rx fails. The pt was seen by DR Tamala Julian on 9/29 and felt to be stable for discharge. We added Imdur 30 mg. His HCTZ was decreased to 25 mg daily. He'll need a BMP when seen in follow up. We will defer addition of an ACE or ARB to his primary cardiologist.    Discharge Vitals:  Blood pressure 138/67, pulse 86, temperature 98.3 F (36.8 C), temperature source Oral, resp. rate 16, height 5\' 6"  (1.676 m), weight 162 lb 3.2 oz (73.6 kg), SpO2 99 %.    Labs: Results for orders placed or  performed during the hospital encounter of 12/20/16 (from the past 24 hour(s))  POCT Activated clotting time     Status: None   Collection Time: 12/21/16  9:39 AM  Result Value Ref Range   Activated Clotting Time 389 seconds  Glucose, capillary     Status: Abnormal   Collection Time: 12/21/16 12:38 PM  Result Value Ref Range   Glucose-Capillary 103 (H) 65 - 99 mg/dL   Comment 1 Notify RN    Comment 2 Document in Chart   Glucose, capillary     Status: Abnormal   Collection Time: 12/21/16  4:59 PM  Result Value Ref Range   Glucose-Capillary 134 (H) 65 - 99 mg/dL   Comment 1 Notify RN    Comment 2 Document in Chart   Glucose, capillary     Status: Abnormal   Collection Time: 12/21/16  9:33 PM  Result Value Ref Range   Glucose-Capillary 150 (H) 65 - 99 mg/dL  Basic metabolic panel     Status: Abnormal   Collection Time: 12/22/16  3:40 AM  Result Value Ref Range   Sodium 136 135 - 145 mmol/L   Potassium 3.8 3.5 - 5.1 mmol/L   Chloride 104 101 - 111 mmol/L   CO2 24 22 - 32 mmol/L   Glucose, Bld  140 (H) 65 - 99 mg/dL   BUN 15 6 - 20 mg/dL   Creatinine, Ser 1.42 (H) 0.61 - 1.24 mg/dL   Calcium 9.0 8.9 - 10.3 mg/dL   GFR calc non Af Amer 50 (L) >60 mL/min   GFR calc Af Amer 58 (L) >60 mL/min   Anion gap 8 5 - 15  CBC     Status: Abnormal   Collection Time: 12/22/16  3:40 AM  Result Value Ref Range   WBC 7.0 4.0 - 10.5 K/uL   RBC 3.92 (L) 4.22 - 5.81 MIL/uL   Hemoglobin 11.2 (L) 13.0 - 17.0 g/dL   HCT 34.7 (L) 39.0 - 52.0 %   MCV 88.5 78.0 - 100.0 fL   MCH 28.6 26.0 - 34.0 pg   MCHC 32.3 30.0 - 36.0 g/dL   RDW 14.3 11.5 - 15.5 %   Platelets 248 150 - 400 K/uL  Glucose, capillary     Status: Abnormal   Collection Time: 12/22/16  7:04 AM  Result Value Ref Range   Glucose-Capillary 161 (H) 65 - 99 mg/dL    Disposition:  Follow-up Information    Lorretta Harp, MD Follow up on 12/27/2016.   Specialties:  Cardiology, Radiology Why:  10:30 Contact information: [address] [place] [address] [phone]        Herminio Commons, MD Follow up on 01/11/2017.   Specialty:  Cardiology Why:  10:40 Contact information: [place] [address] [phone]           Discharge Medications:  Allergies as of 12/22/2016      Reactions   Atorvastatin    All statins make him cough   Statins Cough   Pt is currently taking pravastatin       Medication List    STOP taking these medications   famotidine 20 MG tablet Commonly known as:  PEPCID   metoprolol tartrate 25 MG tablet Commonly known as:  LOPRESSOR     TAKE these medications   acetaminophen 325 MG tablet Commonly known as:  TYLENOL Take 2 tablets (650 mg total) by mouth every 4 (four) hours as needed for headache or mild pain.   allopurinol 100 MG tablet Commonly known as:  ZYLOPRIM Take 1 tablet (100 mg total) by mouth 3 (three) times daily.   ALPRAZolam 0.5 MG tablet Commonly known as:  XANAX TAKE ONE TABLET TWICE DAILY AS NEEDED FOR ANXIETY   aspirin EC 81 MG tablet Take 81 mg by mouth at bedtime.   carvedilol 25 MG tablet Commonly known as:  COREG Take 1 tablet (25 mg total) by mouth 2 (two) times daily.   cetirizine 10 MG tablet Commonly known as:  ZYRTEC Take 1 tablet (10 mg total) by mouth daily as needed (for allergies.).   clopidogrel 75 MG tablet Commonly known as:  PLAVIX Take 1 tablet (75 mg total) by mouth at bedtime.   cyclobenzaprine 10 MG tablet Commonly known as:  FLEXERIL Take 1 tablet (10 mg total) by mouth 3 (three) times daily as needed for muscle spasms (typically scheduled at bedtime).   Febuxostat 80 MG Tabs Commonly known as:  ULORIC Take 1 tablet (80 mg total) by mouth at bedtime. TAKE ONE (1) TABLET EACH DAY What changed:  how much to take  how to take this  when to take this   hydrochlorothiazide 25 MG tablet Commonly known as:  HYDRODIURIL Take 1 tablet (25 mg total) by mouth  daily. What  changed:  when to take this   HYDROcodone-acetaminophen 10-325 MG tablet Commonly known as:  NORCO Take 1 tablet by mouth every 8 (eight) hours as needed.   liraglutide 18 MG/3ML Sopn Commonly known as:  VICTOZA Inject 0.3 mLs (1.8 mg total) into the skin daily. INJECT 0.3ML SQ DAILY What changed:  how much to take  how to take this  when to take this   LYRICA 200 MG capsule Generic drug:  pregabalin Take 1 capsule (200 mg total) by mouth 2 (two) times daily as needed (for pain.).   nitroGLYCERIN 0.4 MG SL tablet Commonly known as:  NITROSTAT Place 1 tablet (0.4 mg total) under the tongue every 5 (five) minutes as needed for chest pain.   pantoprazole 40 MG tablet Commonly known as:  PROTONIX Take 1 tablet (40 mg total) by mouth daily at 6 (six) AM.   pioglitazone 45 MG tablet Commonly known as:  ACTOS Take 1 tablet (45 mg total) by mouth every evening. TAKE ONE (1) TABLET EACH DAY What changed:  how much to take  how to take this  when to take this   pravastatin 40 MG tablet Commonly known as:  PRAVACHOL Take 1 tablet (40 mg total) by mouth every evening. TAKE ONE (1) TABLET EACH DAY What changed:  how much to take  how to take this  when to take this   VENTOLIN HFA 108 (90 Base) MCG/ACT inhaler Generic drug:  albuterol Inhale 2 puffs into the lungs every 6 (six) hours as needed for wheezing or shortness of breath.            Discharge Care Instructions        Start     Ordered   12/23/16 0000  hydrochlorothiazide (HYDRODIURIL) 25 MG tablet  Daily    Question:  Supervising Provider  Answer:  Lorretta Harp   12/22/16 0730   12/22/16 0000  acetaminophen (TYLENOL) 325 MG tablet  Every 4 hours PRN    Question:  Supervising Provider  Answer:  Lorretta Harp   12/22/16 0730   12/22/16 0000  ALPRAZolam Duanne Moron) 0.5 MG tablet    Question:  Supervising Provider  Answer:  Lorretta Harp   12/22/16 0730   12/22/16 0000  cetirizine (ZYRTEC) 10  MG tablet  Daily PRN    Question:  Supervising Provider  Answer:  Lorretta Harp   12/22/16 0730   12/22/16 0000  clopidogrel (PLAVIX) 75 MG tablet  Daily at bedtime    Question:  Supervising Provider  Answer:  Lorretta Harp   12/22/16 0730   12/22/16 0000  cyclobenzaprine (FLEXERIL) 10 MG tablet  3 times daily PRN    Question:  Supervising Provider  Answer:  Lorretta Harp   12/22/16 0730   12/22/16 0000  Febuxostat (ULORIC) 80 MG TABS  Daily at bedtime    Question:  Supervising Provider  Answer:  Lorretta Harp   12/22/16 0730   12/22/16 0000  liraglutide (VICTOZA) 18 MG/3ML SOPN  Daily    Question:  Supervising Provider  Answer:  Lorretta Harp   12/22/16 0730   12/22/16 0000  LYRICA 200 MG capsule  2 times daily PRN    Question:  Supervising Provider  Answer:  Lorretta Harp   12/22/16 0730   12/22/16 0000  pravastatin (PRAVACHOL) 40 MG tablet  Every evening    Question:  Supervising Provider  Answer:  Lorretta Harp  12/22/16 0730   12/22/16 0000  pioglitazone (ACTOS) 45 MG tablet  Every evening    Question:  Supervising Provider  Answer:  Lorretta Harp   12/22/16 0730   12/22/16 0000  pantoprazole (PROTONIX) 40 MG tablet  Daily    Question:  Supervising Provider  Answer:  Lorretta Harp   12/22/16 0730   12/22/16 0000  Amb Referral to Cardiac Rehabilitation    Question Answer Comment  Diagnosis: Coronary Stents   Diagnosis: PTCA      12/22/16 1222       Duration of Discharge Encounter: Greater than 30 minutes including physician time.  Angelena Form PA-C 12/22/2016 9:22 AM

## 2016-12-22 NOTE — Progress Notes (Signed)
  Echocardiogram 2D Echocardiogram has been performed.  Tommy Douglas 12/22/2016, 11:06 AM

## 2016-12-22 NOTE — Progress Notes (Signed)
CARDIAC REHAB PHASE I   PRE:  Rate/Rhythm: 85 SR    BP: sitting 156/78    SaO2: 100 RA  MODE:  Ambulation: 700 ft   POST:  Rate/Rhythm: 110 ST    BP: sitting 143/69     SaO2: 100 RA  Tolerated well, no c/o. Ed completed with pt and wife. He understands importance of Plavix/ASA and takes his meds. He is not interested in quitting smoking. Sts there is too much stress at home. His wife is more interested in quitting. Encouraged them to atleast quit smoking in the house however he does not sound open to this either. Encouraged more exercise. He sts his legs swell with exercise and has f/u regarding PAD. Highly encouraged CRPII and he is interested in this. Will send referral to [facility].  [place], ACSM 12/22/2016 8:48 AM

## 2016-12-22 NOTE — Progress Notes (Signed)
Progress Note  Patient Name: Tommy Douglas Date of Encounter: 12/22/2016  Primary Cardiologist: Dr Bronson Ing  Subjective   No chest pain- up without problems  Inpatient Medications    Scheduled Meds: . allopurinol  100 mg Oral TID  . aspirin EC  81 mg Oral QHS  . carvedilol  25 mg Oral BID  . clopidogrel  75 mg Oral Daily  . febuxostat  80 mg Oral QHS  . pantoprazole  40 mg Oral Q0600  . pioglitazone  45 mg Oral QPM  . pravastatin  40 mg Oral QPM  . sodium chloride flush  3 mL Intravenous Q12H  . sodium chloride flush  3 mL Intravenous Q12H   Continuous Infusions: . sodium chloride    . sodium chloride     PRN Meds: sodium chloride, sodium chloride, acetaminophen, ALPRAZolam, nitroGLYCERIN, ondansetron (ZOFRAN) IV, sodium chloride flush, sodium chloride flush   Vital Signs    Vitals:   12/22/16 0404 12/22/16 0500 12/22/16 0600 12/22/16 0700  BP: (!) 122/55 (!) 144/74 (!) 117/55 138/67  Pulse: 83 85 80 86  Resp: 20 17 15 16   Temp: 98.2 F (36.8 C)   98.3 F (36.8 C)  TempSrc: Oral   Oral  SpO2: 100% 97% 97% 99%  Weight: 162 lb 3.2 oz (73.6 kg)     Height:        Intake/Output Summary (Last 24 hours) at 12/22/16 0908 Last data filed at 12/22/16 0700  Gross per 24 hour  Intake              640 ml  Output             1075 ml  Net             -435 ml   Filed Weights   12/20/16 0919 12/21/16 0405 12/22/16 0404  Weight: 185 lb (83.9 kg) 178 lb 9.2 oz (81 kg) 162 lb 3.2 oz (73.6 kg)    Telemetry    NSR - Personally Reviewed  ECG     NSR, inferior Qs- Personally Reviewed  Physical Exam   GEN: No acute distress.   Neck: No JVD Cardiac: RRR, no murmurs, rubs, or gallops.  Respiratory: decreased breath sounds c/w COPD GI: Soft, nontender, non-distended  MS: No edema; No deformity. Neuro:  Nonfocal  Psych: Normal affect   Labs    Chemistry Recent Labs Lab 12/21/16 0221 12/22/16 0340  NA 135 136  K 4.0 3.8  CL 107 104  CO2 20* 24    GLUCOSE 144* 140*  BUN 17 15  CREATININE 1.49* 1.42*  CALCIUM 8.9 9.0  GFRNONAA 47* 50*  GFRAA 55* 58*  ANIONGAP 8 8     Hematology Recent Labs Lab 12/21/16 0221 12/22/16 0340  WBC 6.7 7.0  RBC 3.78* 3.92*  HGB 10.8* 11.2*  HCT 33.5* 34.7*  MCV 88.6 88.5  MCH 28.6 28.6  MCHC 32.2 32.3  RDW 14.0 14.3  PLT 237 248    Cardiac EnzymesNo results for input(s): TROPONINI in the last 168 hours. No results for input(s): TROPIPOC in the last 168 hours.   BNPNo results for input(s): BNP, PROBNP in the last 168 hours.   DDimer No results for input(s): DDIMER in the last 168 hours.   Radiology    No results found.  Cardiac Studies   Cath 12/20/16 Coronary PCI 12/21/16  Patient Profile     66 y.o. male with a history of prior RCVA PCI '04, HTN, DM,  moderate PVD, HLD, and smoking, admitted for cath 12/20/16 after he was evaluated as an OP with a history of chest pain worrisome for angina.   Assessment & Plan    Angina- Admitted for elective cath  CAD- RCA P{CI in '04, RCA PCI with DES this adm. 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 Will decrease HCTZ to once a day, hold on ARB/ACE till seen by his primary cardiologist as an OP.   DM- Type 2 IDDM  PVD- Bilateral LEA disease-    Plan: Discussed with Dr Tamala Julian. Add Imdur 30 mg. F/U with Dr Gwenlyn Found for [facility] evaluation and Dr Bronson Ing has been scheduled.   The patient has been seen in conjunction with Kerin Ransom, PAC. All aspects of care have been considered and discussed. The patient has been personally interviewed, examined, and all clinical data has been reviewed.   Please see discharge summary.  For questions or updates, please contact [place] Please consult [email or web address] for contact info under Cardiology/STEMI.      Signed, Kerin Ransom, PA-C  12/22/2016, 9:08 AM

## 2016-12-27 ENCOUNTER — Ambulatory Visit (INDEPENDENT_AMBULATORY_CARE_PROVIDER_SITE_OTHER): Payer: Medicare Other | Admitting: Cardiovascular Disease

## 2016-12-27 ENCOUNTER — Encounter: Payer: Self-pay | Admitting: Cardiovascular Disease

## 2016-12-27 DIAGNOSIS — I739 Peripheral vascular disease, unspecified: Secondary | ICD-10-CM

## 2016-12-27 NOTE — Assessment & Plan Note (Signed)
Tommy Douglas was referred to me by Dr. Jacinta Shoe for evaluation and treatment of peripheral vascular disease. He's had discomfort in his left leg for years which is last limiting. He does have a history of ischemic heart disease and other risk factors including ongoing tobacco abuse, hypertension, hyperlipidemia and diabetes. Recent Dopplers performed 11/30/16 revealed a right ABI 0.82 and the left upper 89. He did have a high-frequency signal in his left external iliac. My plan is peripheral angiography and endovascular therapy sometime in November.

## 2016-12-27 NOTE — Patient Instructions (Signed)
   [facility] [address] [place] [address] Dept: [phone] Loc: [place]  12/27/2016  You are scheduled for a Peripheral Angiogram on Thursday, November 15 with Dr. Quay Burow.  1. Please arrive at the [facility] (Main Entrance A) at [facility]: [address] [place], [place] [address] at 9:30 AM (two hours before your procedure to ensure your preparation). Free valet parking service is available.   Special note: Every effort is made to have your procedure done on time. Please understand that emergencies sometimes delay scheduled procedures.  2. Diet: Do not eat or drink anything after midnight prior to your procedure except sips of water to take medications.  3. Labs: You will need to have blood drawn on Monday, November 5 at [place] [address], [place]  Open: [place] (Lunch 12:30 - 1:30)   Phone: [phone]. You do not need to be fasting.  4. Medication instructions in preparation for your procedure:  Stop taking Pioglitazone (Actos) and Liraglutide (Victoza) on Wednesday, November 14.    On the morning of your procedure, take your aspirin and Plavix/Clopidogrel and any morning medicines NOT listed above.  You may use sips of water.  5. Plan for one night stay--bring personal belongings. 6. Bring a current list of your medications and current insurance cards. 7. You MUST have a responsible person to drive you home. 8. Someone MUST be with you the first 24 hours after you arrive home or your discharge will be delayed. 9. Please wear clothes that are easy to get on and off and wear slip-on shoes.  Thank you for allowing [place] to care for you!   -- [facility]

## 2016-12-27 NOTE — Progress Notes (Signed)
12/27/2016 LEVONE OTTEN   1951-02-14  [id number]  Primary Physician Terald Sleeper, PA-C Primary Cardiologist: Lorretta Harp MD Garret Reddish, [place], [place]  HPI:  Tommy Douglas is a 66 y.o. male divorced African-American male father of 54, grandfather of 50 grandchildren is accompanied by his girlfriend [name]. He was referred by Dr. Jacinta Shoe  for peripheral vascular evaluation because of lifestyle limiting medication. His cardiac risk factors are notable for ongoing tobacco abuse one pack per day for the last 45 years, treated hypertension, diabetes and hyperlipidemia. He had a myocardial infarction 15 years ago but has never had a stroke. He recently had circumflex intervention by Dr. Roxine Caddy several days ago. He currently denies chest pain or shortness of breath. He does complain of left lower extremity claudication which is lifestyle limiting a recent Doppler in our office performed 11/30/16 revealed high-frequency signal in his left external iliac artery with a left ABI 0.89.   Current Meds  Medication Sig  . acetaminophen (TYLENOL) 325 MG tablet Take 2 tablets (650 mg total) by mouth every 4 (four) hours as needed for headache or mild pain.  [name] allopurinol (ZYLOPRIM) 100 MG tablet Take 1 tablet (100 mg total) by mouth 3 (three) times daily.  [name] ALPRAZolam (XANAX) 0.5 MG tablet TAKE ONE TABLET TWICE DAILY AS NEEDED FOR ANXIETY  . aspirin EC 81 MG tablet Take 81 mg by mouth at bedtime.   . carvedilol (COREG) 25 MG tablet Take 1 tablet (25 mg total) by mouth 2 (two) times daily.  . cetirizine (ZYRTEC) 10 MG tablet Take 1 tablet (10 mg total) by mouth daily as needed (for allergies.).  [name] clopidogrel (PLAVIX) 75 MG tablet Take 1 tablet (75 mg total) by mouth at bedtime.  . cyclobenzaprine (FLEXERIL) 10 MG tablet Take 1 tablet (10 mg total) by mouth 3 (three) times daily as needed for muscle spasms (typically scheduled at bedtime).  . Febuxostat (ULORIC) 80 MG TABS Take 1 tablet (80 mg  total) by mouth at bedtime. TAKE ONE (1) TABLET EACH DAY  . hydrochlorothiazide (HYDRODIURIL) 25 MG tablet Take 1 tablet (25 mg total) by mouth daily.  [name] HYDROcodone-acetaminophen (NORCO) 10-325 MG tablet Take 1 tablet by mouth every 8 (eight) hours as needed.  . isosorbide mononitrate (IMDUR) 30 MG 24 hr tablet Take 1 tablet (30 mg total) by mouth daily.  [name] liraglutide (VICTOZA) 18 MG/3ML SOPN Inject 0.3 mLs (1.8 mg total) into the skin daily. INJECT 0.3ML SQ DAILY  . LYRICA 200 MG capsule Take 1 capsule (200 mg total) by mouth 2 (two) times daily as needed (for pain.).  [name] nitroGLYCERIN (NITROSTAT) 0.4 MG SL tablet Place 1 tablet (0.4 mg total) under the tongue every 5 (five) minutes as needed for chest pain.  . pantoprazole (PROTONIX) 40 MG tablet Take 1 tablet (40 mg total) by mouth daily at 6 (six) AM.  . pioglitazone (ACTOS) 45 MG tablet Take 1 tablet (45 mg total) by mouth every evening. TAKE ONE (1) TABLET EACH DAY  . pravastatin (PRAVACHOL) 40 MG tablet Take 1 tablet (40 mg total) by mouth every evening. TAKE ONE (1) TABLET EACH DAY  . VENTOLIN HFA 108 (90 Base) MCG/ACT inhaler Inhale 2 puffs into the lungs every 6 (six) hours as needed for wheezing or shortness of breath.     Allergies  Allergen Reactions  . Atorvastatin     All statins make him cough  . Statins Cough    Pt is currently  taking pravastatin     Social History   Social History  . Marital status: Widowed    Spouse name: N/A  . Number of children: N/A  . Years of education: N/A   Occupational History  . Not on file.   Social History Main Topics  . Smoking status: Current Every Day Smoker    Packs/day: 1.00    Years: 44.00    Types: Cigarettes  . Smokeless tobacco: Never Used  . Alcohol use No  . Drug use: No  . Sexual activity: Not Currently   Other Topics Concern  . Not on file   Social History Narrative  . No narrative on file     Review of Systems: General: negative for chills, fever, night  sweats or weight changes.  Cardiovascular: negative for chest pain, dyspnea on exertion, edema, orthopnea, palpitations, paroxysmal nocturnal dyspnea or shortness of breath Dermatological: negative for rash Respiratory: negative for cough or wheezing Urologic: negative for hematuria Abdominal: negative for nausea, vomiting, diarrhea, bright red blood per rectum, melena, or hematemesis Neurologic: negative for visual changes, syncope, or dizziness All other systems reviewed and are otherwise negative except as noted above.    Blood pressure 134/60, pulse 81, height 5\' 5"  (1.651 m), weight 163 lb (73.9 kg).  General appearance: alert and no distress Neck: no adenopathy, no carotid bruit, no JVD, supple, symmetrical, trachea midline and thyroid not enlarged, symmetric, no tenderness/mass/nodules Lungs: clear to auscultation bilaterally Heart: regular rate and rhythm, S1, S2 normal, no murmur, click, rub or gallop Extremities: extremities normal, atraumatic, no cyanosis or edema Pulses: 2+ and symmetric Skin: Skin color, texture, turgor normal. No rashes or lesions Neurologic: Alert and oriented X 3, normal strength and tone. Normal symmetric reflexes. 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 I personally reviewed this EKG.  ASSESSMENT AND PLAN:   PVD (peripheral vascular disease) [facility]) Mr. Agard was referred to me by Dr. Jacinta Shoe for evaluation and treatment of peripheral vascular disease. He's had discomfort in his left leg for years which is last limiting. He does have a history of ischemic heart disease and other risk factors including ongoing tobacco abuse, hypertension, hyperlipidemia and diabetes. Recent Dopplers performed 11/30/16 revealed a right ABI 0.82 and the left upper 89. He did have a high-frequency signal in his left external iliac. My plan is peripheral angiography and  endovascular therapy sometime in November.      Lorretta Harp MD FACP,FACC,FAHA, [facility] 12/27/2016 11:18 AM

## 2017-01-03 ENCOUNTER — Telehealth: Payer: Self-pay | Admitting: Physician Assistant

## 2017-01-03 NOTE — Telephone Encounter (Signed)
Pt called -  2 new meds are  protonix 40 And  Isosorbide 30 He was discharged on 12/23/16. Stents He says it feels like a knife twisting in his abd   NO vomiting, NO nausea - he has had some slight diarrhea.  appt made for Friday

## 2017-01-05 ENCOUNTER — Encounter: Payer: Self-pay | Admitting: Physician Assistant

## 2017-01-05 ENCOUNTER — Ambulatory Visit (INDEPENDENT_AMBULATORY_CARE_PROVIDER_SITE_OTHER): Payer: Medicare Other | Admitting: Physician Assistant

## 2017-01-05 VITALS — BP 133/74 | HR 86 | Temp 97.0°F | Ht 65.0 in | Wt 183.0 lb

## 2017-01-05 DIAGNOSIS — I739 Peripheral vascular disease, unspecified: Secondary | ICD-10-CM

## 2017-01-05 DIAGNOSIS — I10 Essential (primary) hypertension: Secondary | ICD-10-CM

## 2017-01-05 DIAGNOSIS — I200 Unstable angina: Secondary | ICD-10-CM | POA: Diagnosis not present

## 2017-01-05 DIAGNOSIS — M5136 Other intervertebral disc degeneration, lumbar region: Secondary | ICD-10-CM

## 2017-01-05 DIAGNOSIS — M51369 Other intervertebral disc degeneration, lumbar region without mention of lumbar back pain or lower extremity pain: Secondary | ICD-10-CM

## 2017-01-05 MED ORDER — HYDROCODONE-ACETAMINOPHEN 10-325 MG PO TABS: 1.0000 | ORAL_TABLET | Freq: Four times a day (QID) | ORAL | 0 refills | Status: DC | PRN

## 2017-01-05 MED ORDER — HYDROCODONE-ACETAMINOPHEN 10-325 MG PO TABS: 1.0000 | ORAL_TABLET | Freq: Three times a day (TID) | ORAL | 0 refills | Status: DC | PRN

## 2017-01-05 NOTE — Patient Instructions (Signed)
In a few days you may receive a survey in the mail or online from [employer] regarding your visit with us today. Please take a moment to fill this out. Your feedback is very important to our whole office. It can help us better understand your needs as well as improve your experience and satisfaction. Thank you for taking your time to complete it. We care about you.  Shaquasia Caponigro, PA-C  

## 2017-01-05 NOTE — Progress Notes (Signed)
BP 133/74   Pulse 86   Temp (!) 97 F (36.1 C) (Oral)   Ht 5\' 5"  (1.651 m)   Wt 183 lb (83 kg)   BMI 30.45 kg/m    Subjective:    Patient ID: Tommy Douglas, male    DOB: Jul 31, 1950, 66 y.o.   MRN: [id number]  HPI: Tommy Douglas is a 66 y.o. male presenting on 01/05/2017 for Hospitalization Follow-up and Abdominal Pain  A few days after he was discharged from the hospital started having very severe central abdominal pain. It caused him to double over. He denies any fever or chills. He denies any change in his bowel movements. He has not had any nausea or vomiting. There is no through that this made it worse. The pain has come since he was started on Nitro-Dur and pantoprazole. In the past he has taken omeprazole without any problems. I procedures the only new medicine he has been on. I've asked him to stop it for now. He will continue to talk to his cardiologist about this. He does have nitroglycerin sublingual tablets to use in an emergency. He stopped both of the medicines approximately 3 days ago and the pain is much improved.  Relevant past medical, surgical, family and social history reviewed and updated as indicated. Allergies and medications reviewed and updated.  Past Medical History:  Diagnosis Date  . Anxiety   . Arthritis    "qwhere" (12/20/2016)  . Chronic lower back pain   . CKD (chronic kidney disease), stage IV ([place])   . Complication of anesthesia    "got the wrong kind of anesthesia ~ 2000 when they were looking down into my stomach"  . Coronary artery disease   . Gout   . Heart disease   . Hyperlipidemia   . Hypertension   . Pneumonia ~ 2015  . Seasonal allergies   . Type II diabetes mellitus ([place])     Past Surgical History:  Procedure Laterality Date  . CARDIAC CATHETERIZATION  12/20/2016  . CORONARY ANGIOPLASTY WITH STENT PLACEMENT  2004 X 2   "1st stent moved"  . CORONARY STENT INTERVENTION N/A 12/21/2016   Procedure: CORONARY STENT INTERVENTION;   Surgeon: Burnell Blanks, MD;  Location: [facility];  Service: Cardiovascular;  Laterality: N/A;  . ESOPHAGOGASTRODUODENOSCOPY  ~ 2000  . LAPAROSCOPIC CHOLECYSTECTOMY    . LEFT HEART CATH AND CORONARY ANGIOGRAPHY N/A 12/20/2016   Procedure: LEFT HEART CATH AND CORONARY ANGIOGRAPHY;  Surgeon: Burnell Blanks, MD;  Location: [facility];  Service: Cardiovascular;  Laterality: N/A;    Review of Systems  Constitutional: Negative.  Negative for appetite change and fatigue.  HENT: Negative.   Eyes: Negative.  Negative for pain and visual disturbance.  Respiratory: Negative.  Negative for cough, chest tightness, shortness of breath and wheezing.   Cardiovascular: Negative.  Negative for chest pain, palpitations and leg swelling.  Gastrointestinal: Positive for abdominal pain. Negative for blood in stool, constipation, diarrhea, nausea and vomiting.  Endocrine: Negative.   Genitourinary: Negative.   Musculoskeletal: Negative.   Skin: Negative.  Negative for color change and rash.  Neurological: Negative.  Negative for weakness, numbness and headaches.  Psychiatric/Behavioral: Negative.     Allergies as of 01/05/2017      Reactions   Atorvastatin    All statins make him cough   Statins Cough   Pt is currently taking pravastatin       Medication List  Accurate as of 01/05/17  3:33 PM. Always use your most recent med list.          acetaminophen 325 MG tablet Commonly known as:  TYLENOL Take 2 tablets (650 mg total) by mouth every 4 (four) hours as needed for headache or mild pain.   allopurinol 100 MG tablet Commonly known as:  ZYLOPRIM Take 1 tablet (100 mg total) by mouth 3 (three) times daily.   ALPRAZolam 0.5 MG tablet Commonly known as:  XANAX TAKE ONE TABLET TWICE DAILY AS NEEDED FOR ANXIETY   aspirin EC 81 MG tablet Take 81 mg by mouth at bedtime.   carvedilol 25 MG tablet Commonly known as:  COREG Take 1 tablet (25 mg total) by  mouth 2 (two) times daily.   cetirizine 10 MG tablet Commonly known as:  ZYRTEC Take 1 tablet (10 mg total) by mouth daily as needed (for allergies.).   clopidogrel 75 MG tablet Commonly known as:  PLAVIX Take 1 tablet (75 mg total) by mouth at bedtime.   cyclobenzaprine 10 MG tablet Commonly known as:  FLEXERIL Take 1 tablet (10 mg total) by mouth 3 (three) times daily as needed for muscle spasms (typically scheduled at bedtime).   Febuxostat 80 MG Tabs Commonly known as:  ULORIC Take 1 tablet (80 mg total) by mouth at bedtime. TAKE ONE (1) TABLET EACH DAY   hydrochlorothiazide 25 MG tablet Commonly known as:  HYDRODIURIL Take 1 tablet (25 mg total) by mouth daily.   HYDROcodone-acetaminophen 10-325 MG tablet Commonly known as:  NORCO Take 1 tablet by mouth every 8 (eight) hours as needed.   HYDROcodone-acetaminophen 10-325 MG tablet Commonly known as:  NORCO Take 1 tablet by mouth every 6 (six) hours as needed.   HYDROcodone-acetaminophen 10-325 MG tablet Commonly known as:  NORCO Take 1 tablet by mouth every 6 (six) hours as needed.   isosorbide mononitrate 30 MG 24 hr tablet Commonly known as:  IMDUR Take 1 tablet (30 mg total) by mouth daily.   liraglutide 18 MG/3ML Sopn Commonly known as:  VICTOZA Inject 0.3 mLs (1.8 mg total) into the skin daily. INJECT 0.3ML SQ DAILY   LYRICA 200 MG capsule Generic drug:  pregabalin Take 1 capsule (200 mg total) by mouth 2 (two) times daily as needed (for pain.).   nitroGLYCERIN 0.4 MG SL tablet Commonly known as:  NITROSTAT Place 1 tablet (0.4 mg total) under the tongue every 5 (five) minutes as needed for chest pain.   pantoprazole 40 MG tablet Commonly known as:  PROTONIX Take 1 tablet (40 mg total) by mouth daily at 6 (six) AM.   pioglitazone 45 MG tablet Commonly known as:  ACTOS Take 1 tablet (45 mg total) by mouth every evening. TAKE ONE (1) TABLET EACH DAY   pravastatin 40 MG tablet Commonly known as:   PRAVACHOL Take 1 tablet (40 mg total) by mouth every evening. TAKE ONE (1) TABLET EACH DAY   VENTOLIN HFA 108 (90 Base) MCG/ACT inhaler Generic drug:  albuterol Inhale 2 puffs into the lungs every 6 (six) hours as needed for wheezing or shortness of breath.          Objective:    BP 133/74   Pulse 86   Temp (!) 97 F (36.1 C) (Oral)   Ht 5\' 5"  (1.651 m)   Wt 183 lb (83 kg)   BMI 30.45 kg/m   Allergies  Allergen Reactions  . Atorvastatin     All statins make him  cough  . Statins Cough    Pt is currently taking pravastatin     Physical Exam  Constitutional: He appears well-developed and well-nourished.  HENT:  Head: Normocephalic and atraumatic.  Eyes: Pupils are equal, round, and reactive to light. Conjunctivae and EOM are normal.  Neck: Normal range of motion. Neck supple.  Cardiovascular: Normal rate, regular rhythm and normal heart sounds.   Pulmonary/Chest: Effort normal and breath sounds normal.  Abdominal: Soft. Bowel sounds are normal. He exhibits no distension and no mass. There is no tenderness. There is no rebound and no guarding.  Musculoskeletal: Normal range of motion.  Skin: Skin is warm and dry.    Results for orders placed or performed during the hospital encounter of 12/20/16  Glucose, capillary  Result Value Ref Range   Glucose-Capillary 105 (H) 65 - 99 mg/dL  Glucose, capillary  Result Value Ref Range   Glucose-Capillary 92 65 - 99 mg/dL  Glucose, capillary  Result Value Ref Range   Glucose-Capillary 118 (H) 65 - 99 mg/dL   Comment 1 Notify RN    Comment 2 Document in Chart   Basic metabolic panel  Result Value Ref Range   Sodium 135 135 - 145 mmol/L   Potassium 4.0 3.5 - 5.1 mmol/L   Chloride 107 101 - 111 mmol/L   CO2 20 (L) 22 - 32 mmol/L   Glucose, Bld 144 (H) 65 - 99 mg/dL   BUN 17 6 - 20 mg/dL   Creatinine, Ser 1.49 (H) 0.61 - 1.24 mg/dL   Calcium 8.9 8.9 - 10.3 mg/dL   GFR calc non Af Amer 47 (L) >60 mL/min   GFR calc Af Amer  55 (L) >60 mL/min   Anion gap 8 5 - 15  CBC  Result Value Ref Range   WBC 6.7 4.0 - 10.5 K/uL   RBC 3.78 (L) 4.22 - 5.81 MIL/uL   Hemoglobin 10.8 (L) 13.0 - 17.0 g/dL   HCT 33.5 (L) 39.0 - 52.0 %   MCV 88.6 78.0 - 100.0 fL   MCH 28.6 26.0 - 34.0 pg   MCHC 32.2 30.0 - 36.0 g/dL   RDW 14.0 11.5 - 15.5 %   Platelets 237 150 - 400 K/uL  Glucose, capillary  Result Value Ref Range   Glucose-Capillary 158 (H) 65 - 99 mg/dL   Comment 1 Notify RN    Comment 2 Document in Chart   Glucose, capillary  Result Value Ref Range   Glucose-Capillary 122 (H) 65 - 99 mg/dL   Comment 1 Notify RN    Comment 2 Document in Chart   Glucose, capillary  Result Value Ref Range   Glucose-Capillary 103 (H) 65 - 99 mg/dL   Comment 1 Notify RN    Comment 2 Document in Chart   Glucose, capillary  Result Value Ref Range   Glucose-Capillary 134 (H) 65 - 99 mg/dL   Comment 1 Notify RN    Comment 2 Document in Chart   Basic metabolic panel  Result Value Ref Range   Sodium 136 135 - 145 mmol/L   Potassium 3.8 3.5 - 5.1 mmol/L   Chloride 104 101 - 111 mmol/L   CO2 24 22 - 32 mmol/L   Glucose, Bld 140 (H) 65 - 99 mg/dL   BUN 15 6 - 20 mg/dL   Creatinine, Ser 1.42 (H) 0.61 - 1.24 mg/dL   Calcium 9.0 8.9 - 10.3 mg/dL   GFR calc non Af Amer 50 (L) >60 mL/min  GFR calc Af Amer 58 (L) >60 mL/min   Anion gap 8 5 - 15  CBC  Result Value Ref Range   WBC 7.0 4.0 - 10.5 K/uL   RBC 3.92 (L) 4.22 - 5.81 MIL/uL   Hemoglobin 11.2 (L) 13.0 - 17.0 g/dL   HCT 34.7 (L) 39.0 - 52.0 %   MCV 88.5 78.0 - 100.0 fL   MCH 28.6 26.0 - 34.0 pg   MCHC 32.3 30.0 - 36.0 g/dL   RDW 14.3 11.5 - 15.5 %   Platelets 248 150 - 400 K/uL  Glucose, capillary  Result Value Ref Range   Glucose-Capillary 150 (H) 65 - 99 mg/dL  Glucose, capillary  Result Value Ref Range   Glucose-Capillary 161 (H) 65 - 99 mg/dL  POCT Activated clotting time  Result Value Ref Range   Activated Clotting Time 389 seconds  ECHOCARDIOGRAM COMPLETE    Result Value Ref Range   Weight 2,595.2 oz   Height 66 in   BP 145/78 mmHg      Assessment & Plan:   1. Essential hypertension  2. PVD (peripheral vascular disease) ([place])  3. Unstable angina ([place])  4. DDD (degenerative disc disease), lumbar - HYDROcodone-acetaminophen (NORCO) 10-325 MG tablet; Take 1 tablet by mouth every 8 (eight) hours as needed.  Dispense: 120 tablet; Refill: 0 - HYDROcodone-acetaminophen (NORCO) 10-325 MG tablet; Take 1 tablet by mouth every 6 (six) hours as needed.  Dispense: 120 tablet; Refill: 0 - HYDROcodone-acetaminophen (NORCO) 10-325 MG tablet; Take 1 tablet by mouth every 6 (six) hours as needed.  Dispense: 120 tablet; Refill: 0 :  Hold Nitro-Dur, keep nitroglycerin tablets at all times with him. Use as directed. Returns to cardiology in one month. Hold pantoprazole. If any worsening of symptoms try Zantac, he has used this before.   Current Outpatient Prescriptions:  .  acetaminophen (TYLENOL) 325 MG tablet, Take 2 tablets (650 mg total) by mouth every 4 (four) hours as needed for headache or mild pain., Disp: , Rfl:  .  allopurinol (ZYLOPRIM) 100 MG tablet, Take 1 tablet (100 mg total) by mouth 3 (three) times daily., Disp: 270 tablet, Rfl: 3 .  ALPRAZolam (XANAX) 0.5 MG tablet, TAKE ONE TABLET TWICE DAILY AS NEEDED FOR ANXIETY, Disp: 180 tablet, Rfl: 0 .  aspirin EC 81 MG tablet, Take 81 mg by mouth at bedtime. , Disp: , Rfl:  .  carvedilol (COREG) 25 MG tablet, Take 1 tablet (25 mg total) by mouth 2 (two) times daily., Disp: 180 tablet, Rfl: 3 .  cetirizine (ZYRTEC) 10 MG tablet, Take 1 tablet (10 mg total) by mouth daily as needed (for allergies.)., Disp: , Rfl:  .  clopidogrel (PLAVIX) 75 MG tablet, Take 1 tablet (75 mg total) by mouth at bedtime., Disp: , Rfl:  .  cyclobenzaprine (FLEXERIL) 10 MG tablet, Take 1 tablet (10 mg total) by mouth 3 (three) times daily as needed for muscle spasms (typically scheduled at bedtime)., Disp: , Rfl:  .   Febuxostat (ULORIC) 80 MG TABS, Take 1 tablet (80 mg total) by mouth at bedtime. TAKE ONE (1) TABLET EACH DAY, Disp: , Rfl:  .  hydrochlorothiazide (HYDRODIURIL) 25 MG tablet, Take 1 tablet (25 mg total) by mouth daily., Disp: 180 tablet, Rfl: 3 .  HYDROcodone-acetaminophen (NORCO) 10-325 MG tablet, Take 1 tablet by mouth every 8 (eight) hours as needed., Disp: 120 tablet, Rfl: 0 .  isosorbide mononitrate (IMDUR) 30 MG 24 hr tablet, Take 1 tablet (30 mg total) by  mouth daily., Disp: 90 tablet, Rfl: 3 .  liraglutide (VICTOZA) 18 MG/3ML SOPN, Inject 0.3 mLs (1.8 mg total) into the skin daily. INJECT 0.3ML SQ DAILY, Disp: , Rfl:  .  LYRICA 200 MG capsule, Take 1 capsule (200 mg total) by mouth 2 (two) times daily as needed (for pain.)., Disp: , Rfl:  .  nitroGLYCERIN (NITROSTAT) 0.4 MG SL tablet, Place 1 tablet (0.4 mg total) under the tongue every 5 (five) minutes as needed for chest pain., Disp: 25 tablet, Rfl: 3 .  pantoprazole (PROTONIX) 40 MG tablet, Take 1 tablet (40 mg total) by mouth daily at 6 (six) AM., Disp: 30 tablet, Rfl: 5 .  pioglitazone (ACTOS) 45 MG tablet, Take 1 tablet (45 mg total) by mouth every evening. TAKE ONE (1) TABLET EACH DAY, Disp: , Rfl:  .  pravastatin (PRAVACHOL) 40 MG tablet, Take 1 tablet (40 mg total) by mouth every evening. TAKE ONE (1) TABLET EACH DAY, Disp: , Rfl:  .  VENTOLIN HFA 108 (90 Base) MCG/ACT inhaler, Inhale 2 puffs into the lungs every 6 (six) hours as needed for wheezing or shortness of breath., Disp: 18 g, Rfl: 6 .  HYDROcodone-acetaminophen (NORCO) 10-325 MG tablet, Take 1 tablet by mouth every 6 (six) hours as needed., Disp: 120 tablet, Rfl: 0 .  HYDROcodone-acetaminophen (NORCO) 10-325 MG tablet, Take 1 tablet by mouth every 6 (six) hours as needed., Disp: 120 tablet, Rfl: 0 Continue all other maintenance medications as listed above.  Follow up plan: Return in about 3 months (around 04/07/2017).  Educational handout given for [place] PA-C [facility] [address]  [place], [place] [address] [phone]   01/05/2017, 3:33 PM

## 2017-01-11 ENCOUNTER — Ambulatory Visit (INDEPENDENT_AMBULATORY_CARE_PROVIDER_SITE_OTHER): Payer: Medicare Other | Admitting: Cardiovascular Disease

## 2017-01-11 ENCOUNTER — Encounter: Payer: Self-pay | Admitting: Cardiovascular Disease

## 2017-01-11 VITALS — BP 138/64 | HR 86 | Ht 66.0 in | Wt 186.0 lb

## 2017-01-11 DIAGNOSIS — I209 Angina pectoris, unspecified: Secondary | ICD-10-CM | POA: Diagnosis not present

## 2017-01-11 DIAGNOSIS — E785 Hyperlipidemia, unspecified: Secondary | ICD-10-CM | POA: Diagnosis not present

## 2017-01-11 DIAGNOSIS — I200 Unstable angina: Secondary | ICD-10-CM

## 2017-01-11 DIAGNOSIS — I10 Essential (primary) hypertension: Secondary | ICD-10-CM

## 2017-01-11 DIAGNOSIS — I739 Peripheral vascular disease, unspecified: Secondary | ICD-10-CM

## 2017-01-11 DIAGNOSIS — Z720 Tobacco use: Secondary | ICD-10-CM | POA: Diagnosis not present

## 2017-01-11 DIAGNOSIS — Z955 Presence of coronary angioplasty implant and graft: Secondary | ICD-10-CM

## 2017-01-11 DIAGNOSIS — I25118 Atherosclerotic heart disease of native coronary artery with other forms of angina pectoris: Secondary | ICD-10-CM | POA: Diagnosis not present

## 2017-01-11 NOTE — Patient Instructions (Signed)

## 2017-01-11 NOTE — Progress Notes (Signed)
SUBJECTIVE: The patient presents for routine follow-up. He underwent coronary angiography and PCI on 12/21/16 with drug-eluting stent placement to the mid RCA. He had residual 99% stenosis of the proximal to mid circumflex. The proximal segment filled distally from right to left and left to left collaterals. The interventional team reviewed the circumflex artery disease and did not feel it needed intervention.  He saw Dr. Gwenlyn Found for peripheral vascular disease with abnormal ABIs. He had a high frequency signal in the left external iliac and Dr. Gwenlyn Found plans peripheral angiography and endovascular therapy in November.  He is doing very well and denies chest pain, leg swelling, palpitations, and shortness of breath.  He plans to attend cardiac rehabilitation in [place].  He is trying to quit smoking.  He spends a lot of time helping to take care of grandchildren and great grandchildren.     Review of Systems: As per "subjective", otherwise negative.  Allergies  Allergen Reactions  . Atorvastatin     All statins make him cough  . Statins Cough    Pt is currently taking pravastatin     Current Outpatient Prescriptions  Medication Sig Dispense Refill  . acetaminophen (TYLENOL) 325 MG tablet Take 2 tablets (650 mg total) by mouth every 4 (four) hours as needed for headache or mild pain.    [name] allopurinol (ZYLOPRIM) 100 MG tablet Take 1 tablet (100 mg total) by mouth 3 (three) times daily. 270 tablet 3  . ALPRAZolam (XANAX) 0.5 MG tablet TAKE ONE TABLET TWICE DAILY AS NEEDED FOR ANXIETY 180 tablet 0  . aspirin EC 81 MG tablet Take 81 mg by mouth at bedtime.     . carvedilol (COREG) 25 MG tablet Take 1 tablet (25 mg total) by mouth 2 (two) times daily. 180 tablet 3  . cetirizine (ZYRTEC) 10 MG tablet Take 1 tablet (10 mg total) by mouth daily as needed (for allergies.).    [name] clopidogrel (PLAVIX) 75 MG tablet Take 1 tablet (75 mg total) by mouth at bedtime.    . cyclobenzaprine  (FLEXERIL) 10 MG tablet Take 1 tablet (10 mg total) by mouth 3 (three) times daily as needed for muscle spasms (typically scheduled at bedtime).    . Febuxostat (ULORIC) 80 MG TABS Take 1 tablet (80 mg total) by mouth at bedtime. TAKE ONE (1) TABLET EACH DAY    . hydrochlorothiazide (HYDRODIURIL) 25 MG tablet Take 1 tablet (25 mg total) by mouth daily. 180 tablet 3  . HYDROcodone-acetaminophen (NORCO) 10-325 MG tablet Take 1 tablet by mouth every 8 (eight) hours as needed. 120 tablet 0  . isosorbide mononitrate (IMDUR) 30 MG 24 hr tablet Take 1 tablet (30 mg total) by mouth daily. 90 tablet 3  . liraglutide (VICTOZA) 18 MG/3ML SOPN Inject 0.3 mLs (1.8 mg total) into the skin daily. INJECT 0.3ML SQ DAILY    . LYRICA 200 MG capsule Take 1 capsule (200 mg total) by mouth 2 (two) times daily as needed (for pain.).    [name] nitroGLYCERIN (NITROSTAT) 0.4 MG SL tablet Place 1 tablet (0.4 mg total) under the tongue every 5 (five) minutes as needed for chest pain. 25 tablet 3  . pantoprazole (PROTONIX) 40 MG tablet Take 1 tablet (40 mg total) by mouth daily at 6 (six) AM. 30 tablet 5  . pioglitazone (ACTOS) 45 MG tablet Take 1 tablet (45 mg total) by mouth every evening. TAKE ONE (1) TABLET EACH DAY    . pravastatin (PRAVACHOL) 40 MG  tablet Take 1 tablet (40 mg total) by mouth every evening. TAKE ONE (1) TABLET EACH DAY    . VENTOLIN HFA 108 (90 Base) MCG/ACT inhaler Inhale 2 puffs into the lungs every 6 (six) hours as needed for wheezing or shortness of breath. 18 g 6   No current facility-administered medications for this visit.     Past Medical History:  Diagnosis Date  . Anxiety   . Arthritis    "qwhere" (12/20/2016)  . Chronic lower back pain   . CKD (chronic kidney disease), stage IV ([place])   . Complication of anesthesia    "got the wrong kind of anesthesia ~ 2000 when they were looking down into my stomach"  . Coronary artery disease   . Gout   . Heart disease   . Hyperlipidemia   . Hypertension    . Pneumonia ~ 2015  . Seasonal allergies   . Type II diabetes mellitus ([place])     Past Surgical History:  Procedure Laterality Date  . CARDIAC CATHETERIZATION  12/20/2016  . CORONARY ANGIOPLASTY WITH STENT PLACEMENT  2004 X 2   "1st stent moved"  . CORONARY STENT INTERVENTION N/A 12/21/2016   Procedure: CORONARY STENT INTERVENTION;  Surgeon: Burnell Blanks, MD;  Location: [facility];  Service: Cardiovascular;  Laterality: N/A;  . ESOPHAGOGASTRODUODENOSCOPY  ~ 2000  . LAPAROSCOPIC CHOLECYSTECTOMY    . LEFT HEART CATH AND CORONARY ANGIOGRAPHY N/A 12/20/2016   Procedure: LEFT HEART CATH AND CORONARY ANGIOGRAPHY;  Surgeon: Burnell Blanks, MD;  Location: [facility];  Service: Cardiovascular;  Laterality: N/A;    Social History   Social History  . Marital status: Widowed    Spouse name: N/A  . Number of children: N/A  . Years of education: N/A   Occupational History  . Not on file.   Social History Main Topics  . Smoking status: Current Every Day Smoker    Packs/day: 1.00    Years: 44.00    Types: Cigarettes  . Smokeless tobacco: Never Used  . Alcohol use No  . Drug use: No  . Sexual activity: Not Currently   Other Topics Concern  . Not on file   Social History Narrative  . No narrative on file     Vitals:   01/11/17 1043  BP: 138/64  Pulse: 86  SpO2: 98%  Weight: 186 lb (84.4 kg)  Height: 5\' 6"  (1.676 m)    Wt Readings from Last 3 Encounters:  01/11/17 186 lb (84.4 kg)  01/05/17 183 lb (83 kg)  12/27/16 163 lb (73.9 kg)     PHYSICAL EXAM General: NAD HEENT: Normal. Neck: No JVD, no thyromegaly. Lungs: Diminished throughout, no crackles or wheezes. CV: Regular rate and rhythm, normal S1/S2, no S3/S4, no murmur. No pretibial or periankle edema.  No carotid bruit.   Abdomen: Soft, nontender, no distention.  Neurologic: Alert and oriented.  Psych: Normal affect. Skin: Normal. Musculoskeletal: No gross  deformities.    ECG: Most recent ECG reviewed.   Labs: Lab Results  Component Value Date/Time   K 3.8 12/22/2016 03:40 AM   BUN 15 12/22/2016 03:40 AM   BUN 18 11/03/2016 10:36 AM   CREATININE 1.42 (H) 12/22/2016 03:40 AM   ALT 6 11/03/2016 10:36 AM   TSH 2.170 04/25/2016 11:03 AM   HGB 11.2 (L) 12/22/2016 03:40 AM   HGB 11.3 (L) 11/03/2016 10:36 AM     Lipids: Lab Results  Component Value Date/Time   LDLCALC  82 11/03/2016 10:36 AM   CHOL 155 11/03/2016 10:36 AM   TRIG 246 (H) 11/03/2016 10:36 AM   HDL 24 (L) 11/03/2016 10:36 AM       ASSESSMENT AND PLAN:  1. Coronary artery disease s/p RCA PCI: Symptomatically stable. Cath results detailed above. Currently on ASA, Plavix, Coreg, and statin. No changes to therapy. I may continue dual antiplatelet therapy indefinitely.  2. Hypertension: Controlled. No changes. Given concurrent IDDM, consider switching HCTZ to ACEI as thiazide will also contribute to hyperglycemia. Will need to carefully monitor renal function.  3. Hyperlipidemia: On pravastatin.Lipids 11/03/16-total cholesterol 155, triglycerides mildly elevated at 246, HDL low at 24, LDL 82. LDL previously 106 on 08/02/16.  4. CKD stage III: GFR 58 on 11/03/16.  5. Claudication with PVD:He has a long history of tobacco abuse. ABIs showed aortoiliac atherosclerosis with greater than 50% stenosis of the bilateral external iliac arteries, left greater than right, 50-74% stenosis of the mid and distal right SFA, 30-49% stenosis of the left mid SFA, and 50-74% stenosis of the left distal SFA. There was two-vessel runoff bilaterally with both posterior tibial arteries being occluded. He needs tobacco cessation. Continue aspirin and statin. Lower extremity angiography and endovascular therapy planned by Dr. Gwenlyn Found in November.  6. Tobacco abuse disorder: Chronic. Needs cessation given coronary disease and peripheral vascular disease. He is finally considering  quitting.      Disposition: Follow up 4 months.   Kate Sable, M.D., F.A.C.C.

## 2017-01-24 ENCOUNTER — Ambulatory Visit: Payer: Medicare Other | Admitting: Cardiovascular Disease

## 2017-01-29 ENCOUNTER — Telehealth: Payer: Self-pay | Admitting: Cardiovascular Disease

## 2017-01-29 ENCOUNTER — Other Ambulatory Visit: Payer: Self-pay | Admitting: Physician Assistant

## 2017-01-29 NOTE — Telephone Encounter (Signed)
Spoke with pt he stated that he is not going to have a ride to take him for his procedure on 11/15, I'm not seeing a procedure schedule for this day but on Dr Gwenlyn Found last office note there was a procedure supposed to be schedule. Can you please cancel procedure.

## 2017-01-29 NOTE — Telephone Encounter (Signed)
Pt wants to cx his procedure on 02-08-17 at the hospital please.

## 2017-01-30 NOTE — Telephone Encounter (Signed)
Rx called to pharmacy

## 2017-01-30 NOTE — Telephone Encounter (Signed)
Last seen 01/05/17  Glenard Haring  If approved route to nurse to call into [facility]

## 2017-02-01 NOTE — Telephone Encounter (Signed)
Attempted to call pt to reschedule procedure. Someone else picked up and said he was not in now. Left message with person to have Tommy Douglas call back when he can.

## 2017-02-05 ENCOUNTER — Encounter: Payer: Self-pay | Admitting: Physician Assistant

## 2017-02-05 ENCOUNTER — Ambulatory Visit (INDEPENDENT_AMBULATORY_CARE_PROVIDER_SITE_OTHER): Payer: Medicare Other | Admitting: Physician Assistant

## 2017-02-05 DIAGNOSIS — Z23 Encounter for immunization: Secondary | ICD-10-CM | POA: Diagnosis not present

## 2017-02-05 DIAGNOSIS — I2510 Atherosclerotic heart disease of native coronary artery without angina pectoris: Secondary | ICD-10-CM

## 2017-02-05 DIAGNOSIS — E785 Hyperlipidemia, unspecified: Secondary | ICD-10-CM

## 2017-02-05 DIAGNOSIS — E1122 Type 2 diabetes mellitus with diabetic chronic kidney disease: Secondary | ICD-10-CM | POA: Diagnosis not present

## 2017-02-05 DIAGNOSIS — N182 Chronic kidney disease, stage 2 (mild): Secondary | ICD-10-CM | POA: Diagnosis not present

## 2017-02-05 DIAGNOSIS — I10 Essential (primary) hypertension: Secondary | ICD-10-CM | POA: Diagnosis not present

## 2017-02-05 DIAGNOSIS — E1169 Type 2 diabetes mellitus with other specified complication: Secondary | ICD-10-CM

## 2017-02-05 DIAGNOSIS — M5136 Other intervertebral disc degeneration, lumbar region: Secondary | ICD-10-CM | POA: Diagnosis not present

## 2017-02-05 DIAGNOSIS — J3089 Other allergic rhinitis: Secondary | ICD-10-CM | POA: Diagnosis not present

## 2017-02-05 DIAGNOSIS — M1A9XX0 Chronic gout, unspecified, without tophus (tophi): Secondary | ICD-10-CM

## 2017-02-05 LAB — BAYER DCA HB A1C WAIVED: HB A1C (BAYER DCA - WAIVED): 5.5 % (ref <7.0)

## 2017-02-05 MED ORDER — LIRAGLUTIDE 18 MG/3ML ~~LOC~~ SOPN: 1.8000 mg | PEN_INJECTOR | Freq: Every day | SUBCUTANEOUS | 11 refills | Status: DC

## 2017-02-05 MED ORDER — PRAVASTATIN SODIUM 40 MG PO TABS: 40.0000 mg | ORAL_TABLET | Freq: Every evening | ORAL | 3 refills | Status: DC

## 2017-02-05 MED ORDER — CLOPIDOGREL BISULFATE 75 MG PO TABS: 75.0000 mg | ORAL_TABLET | Freq: Every day | ORAL | 3 refills | Status: DC

## 2017-02-05 MED ORDER — CARVEDILOL 25 MG PO TABS: 25.0000 mg | ORAL_TABLET | Freq: Two times a day (BID) | ORAL | 3 refills | Status: DC

## 2017-02-05 MED ORDER — PIOGLITAZONE HCL 45 MG PO TABS: 45.0000 mg | ORAL_TABLET | Freq: Every evening | ORAL | 3 refills | Status: DC

## 2017-02-05 MED ORDER — CETIRIZINE HCL 10 MG PO TABS: 10.0000 mg | ORAL_TABLET | Freq: Every day | ORAL | 3 refills | Status: DC | PRN

## 2017-02-05 MED ORDER — HYDROCODONE-ACETAMINOPHEN 10-325 MG PO TABS: 1.0000 | ORAL_TABLET | Freq: Four times a day (QID) | ORAL | 0 refills | Status: DC | PRN

## 2017-02-05 MED ORDER — ALLOPURINOL 100 MG PO TABS: 100.0000 mg | ORAL_TABLET | Freq: Three times a day (TID) | ORAL | 3 refills | Status: DC

## 2017-02-05 MED ORDER — FEBUXOSTAT 80 MG PO TABS: 80.0000 mg | ORAL_TABLET | Freq: Every day | ORAL | 3 refills | Status: DC

## 2017-02-05 MED ORDER — HYDROCODONE-ACETAMINOPHEN 10-325 MG PO TABS: 1.0000 | ORAL_TABLET | Freq: Three times a day (TID) | ORAL | 0 refills | Status: DC | PRN

## 2017-02-05 MED ORDER — CYCLOBENZAPRINE HCL 10 MG PO TABS: 10.0000 mg | ORAL_TABLET | Freq: Three times a day (TID) | ORAL | 5 refills | Status: DC | PRN

## 2017-02-05 MED ORDER — ALPRAZOLAM 0.5 MG PO TABS: ORAL_TABLET | ORAL | 1 refills | Status: DC

## 2017-02-05 NOTE — Patient Instructions (Signed)
In a few days you may receive a survey in the mail or online from [employer] regarding your visit with us today. Please take a moment to fill this out. Your feedback is very important to our whole office. It can help us better understand your needs as well as improve your experience and satisfaction. Thank you for taking your time to complete it. We care about you.  Tkeya Stencil, PA-C  

## 2017-02-06 DIAGNOSIS — I2510 Atherosclerotic heart disease of native coronary artery without angina pectoris: Secondary | ICD-10-CM | POA: Insufficient documentation

## 2017-02-06 DIAGNOSIS — I2583 Coronary atherosclerosis due to lipid rich plaque: Secondary | ICD-10-CM

## 2017-02-06 LAB — CBC WITH DIFFERENTIAL/PLATELET
Basophils Absolute: 0.1 10*3/uL (ref 0.0–0.2)
Basos: 1 %
EOS (ABSOLUTE): 0.3 10*3/uL (ref 0.0–0.4)
EOS: 6 %
HEMATOCRIT: 37.5 % (ref 37.5–51.0)
HEMOGLOBIN: 11.8 g/dL — AB (ref 13.0–17.7)
IMMATURE GRANS (ABS): 0 10*3/uL (ref 0.0–0.1)
IMMATURE GRANULOCYTES: 0 %
Lymphocytes Absolute: 2.1 10*3/uL (ref 0.7–3.1)
Lymphs: 35 %
MCH: 28 pg (ref 26.6–33.0)
MCHC: 31.5 g/dL (ref 31.5–35.7)
MCV: 89 fL (ref 79–97)
MONOCYTES: 7 %
MONOS ABS: 0.4 10*3/uL (ref 0.1–0.9)
NEUTROS PCT: 51 %
Neutrophils Absolute: 3.1 10*3/uL (ref 1.4–7.0)
Platelets: 276 10*3/uL (ref 150–379)
RBC: 4.21 x10E6/uL (ref 4.14–5.80)
RDW: 14.7 % (ref 12.3–15.4)
WBC: 6 10*3/uL (ref 3.4–10.8)

## 2017-02-06 LAB — CMP14+EGFR
ALBUMIN: 4.2 g/dL (ref 3.6–4.8)
ALK PHOS: 81 IU/L (ref 39–117)
ALT: 10 IU/L (ref 0–44)
AST: 19 IU/L (ref 0–40)
Albumin/Globulin Ratio: 1.3 (ref 1.2–2.2)
BUN/Creatinine Ratio: 8 — ABNORMAL LOW (ref 10–24)
BUN: 14 mg/dL (ref 8–27)
Bilirubin Total: 0.3 mg/dL (ref 0.0–1.2)
CALCIUM: 9.7 mg/dL (ref 8.6–10.2)
CO2: 24 mmol/L (ref 20–29)
CREATININE: 1.72 mg/dL — AB (ref 0.76–1.27)
Chloride: 98 mmol/L (ref 96–106)
GFR calc Af Amer: 47 mL/min/{1.73_m2} — ABNORMAL LOW (ref >59)
GFR, EST NON AFRICAN AMERICAN: 41 mL/min/{1.73_m2} — AB (ref >59)
GLOBULIN, TOTAL: 3.3 g/dL (ref 1.5–4.5)
GLUCOSE: 92 mg/dL (ref 65–99)
Potassium: 4.1 mmol/L (ref 3.5–5.2)
SODIUM: 137 mmol/L (ref 134–144)
Total Protein: 7.5 g/dL (ref 6.0–8.5)

## 2017-02-06 LAB — LIPID PANEL
CHOL/HDL RATIO: 5.8 ratio — AB (ref 0.0–5.0)
Cholesterol, Total: 161 mg/dL (ref 100–199)
HDL: 28 mg/dL — AB (ref >39)
LDL CALC: 105 mg/dL — AB (ref 0–99)
TRIGLYCERIDES: 138 mg/dL (ref 0–149)
VLDL CHOLESTEROL CAL: 28 mg/dL (ref 5–40)

## 2017-02-06 NOTE — Progress Notes (Signed)
BP 121/70   Pulse 98   Temp (!) 97.5 F (36.4 C) (Oral)   Ht 5' 6"  (1.676 m)   Wt 184 lb 9.6 oz (83.7 kg)   BMI 29.80 kg/m    Subjective:    Patient ID: Tommy Douglas, male    DOB: 11-15-1950, 66 y.o.   MRN: [id number]  HPI: Tommy Douglas is a 66 y.o. male presenting on 02/05/2017 for Follow-up (3 month ); Hypertension; Hyperlipidemia; and Diabetes  Patient comes in for 89-monthfollow-up on multiple conditions.  He is here for his hypertension, hyperlipidemia, diabetes, degenerative disc disease, gout, allergies, CAD, CKD.  He will have labs drawn today.  He states overall he has had fairly good glucose readings.  Most mornings he is between 100 150.  He states he has not had any severe highs.  He has chronically painful hands.  He is not exhausted the gabapentin therapy so we are going to progress that and see if it will help with his chronic pain.  He has been seen by cardiology over the past few months.  Stents have been placed recently.  They are looking at performing something to his left saphenous vein.  He is experiencing mild claudication symptoms.  Relevant past medical, surgical, family and social history reviewed and updated as indicated. Allergies and medications reviewed and updated.  Past Medical History:  Diagnosis Date  . Anxiety   . Arthritis    "qwhere" (12/20/2016)  . Chronic lower back pain   . CKD (chronic kidney disease), stage IV ([facility]   . Complication of anesthesia    "got the wrong kind of anesthesia ~ 2000 when they were looking down into my stomach"  . Coronary artery disease   . Gout   . Heart disease   . Hyperlipidemia   . Hypertension   . Pneumonia ~ 2015  . Seasonal allergies   . Type II diabetes mellitus ([facility]     Past Surgical History:  Procedure Laterality Date  . CARDIAC CATHETERIZATION  12/20/2016  . CORONARY ANGIOPLASTY WITH STENT PLACEMENT  2004 X 2   "1st stent moved"  . ESOPHAGOGASTRODUODENOSCOPY  ~ 2000  . LAPAROSCOPIC  CHOLECYSTECTOMY      Review of Systems  Constitutional: Negative for appetite change and fatigue.  HENT: Negative.   Eyes: Negative.  Negative for pain and visual disturbance.  Respiratory: Negative.  Negative for cough, chest tightness, shortness of breath and wheezing.   Cardiovascular: Negative.  Negative for chest pain, palpitations and leg swelling.  Gastrointestinal: Negative.  Negative for abdominal pain, diarrhea, nausea and vomiting.  Endocrine: Negative.   Genitourinary: Negative.   Musculoskeletal: Positive for arthralgias, back pain, joint swelling, myalgias and neck pain.  Skin: Negative.  Negative for color change and rash.  Neurological: Positive for weakness. Negative for numbness and headaches.  Psychiatric/Behavioral: The patient is nervous/anxious.     Allergies as of 02/05/2017      Reactions   Atorvastatin    All statins make him cough   Statins Cough   Pt is currently taking pravastatin       Medication List        Accurate as of 02/05/17 11:59 PM. Always use your most recent med list.          acetaminophen 325 MG tablet Commonly known as:  TYLENOL Take 2 tablets (650 mg total) by mouth every 4 (four) hours as needed for headache or mild pain.   allopurinol 100 MG tablet  Commonly known as:  ZYLOPRIM Take 1 tablet (100 mg total) 3 (three) times daily by mouth.   ALPRAZolam 0.5 MG tablet Commonly known as:  XANAX TAKE ONE TABLET TWICE DAILY AS NEEDED   aspirin EC 81 MG tablet Take 81 mg by mouth at bedtime.   carvedilol 25 MG tablet Commonly known as:  COREG Take 1 tablet (25 mg total) 2 (two) times daily by mouth.   cetirizine 10 MG tablet Commonly known as:  ZYRTEC Take 1 tablet (10 mg total) daily as needed by mouth (for allergies.).   clopidogrel 75 MG tablet Commonly known as:  PLAVIX Take 1 tablet (75 mg total) at bedtime by mouth.   cyclobenzaprine 10 MG tablet Commonly known as:  FLEXERIL Take 1 tablet (10 mg total) 3 (three)  times daily as needed by mouth for muscle spasms (typically scheduled at bedtime).   Febuxostat 80 MG Tabs Commonly known as:  ULORIC Take 1 tablet (80 mg total) at bedtime by mouth. TAKE ONE (1) TABLET EACH DAY   hydrochlorothiazide 25 MG tablet Commonly known as:  HYDRODIURIL Take 1 tablet (25 mg total) by mouth daily.   HYDROcodone-acetaminophen 10-325 MG tablet Commonly known as:  NORCO Take 1 tablet every 8 (eight) hours as needed by mouth.   HYDROcodone-acetaminophen 10-325 MG tablet Commonly known as:  NORCO Take 1 tablet every 6 (six) hours as needed by mouth.   HYDROcodone-acetaminophen 10-325 MG tablet Commonly known as:  NORCO Take 1 tablet every 6 (six) hours as needed by mouth.   isosorbide mononitrate 30 MG 24 hr tablet Commonly known as:  IMDUR Take 1 tablet (30 mg total) by mouth daily.   liraglutide 18 MG/3ML Sopn Commonly known as:  VICTOZA Inject 0.3 mLs (1.8 mg total) daily into the skin. INJECT 0.3ML SQ DAILY   LYRICA 200 MG capsule Generic drug:  pregabalin Take 1 capsule (200 mg total) by mouth 2 (two) times daily as needed (for pain.).   nitroGLYCERIN 0.4 MG SL tablet Commonly known as:  NITROSTAT Place 1 tablet (0.4 mg total) under the tongue every 5 (five) minutes as needed for chest pain.   pantoprazole 40 MG tablet Commonly known as:  PROTONIX Take 1 tablet (40 mg total) by mouth daily at 6 (six) AM.   pioglitazone 45 MG tablet Commonly known as:  ACTOS Take 1 tablet (45 mg total) every evening by mouth. TAKE ONE (1) TABLET EACH DAY   pravastatin 40 MG tablet Commonly known as:  PRAVACHOL Take 1 tablet (40 mg total) every evening by mouth. TAKE ONE (1) TABLET EACH DAY   VENTOLIN HFA 108 (90 Base) MCG/ACT inhaler Generic drug:  albuterol Inhale 2 puffs into the lungs every 6 (six) hours as needed for wheezing or shortness of breath.          Objective:    BP 121/70   Pulse 98   Temp (!) 97.5 F (36.4 C) (Oral)   Ht 5' 6"   (1.676 m)   Wt 184 lb 9.6 oz (83.7 kg)   BMI 29.80 kg/m   Allergies  Allergen Reactions  . Atorvastatin     All statins make him cough  . Statins Cough    Pt is currently taking pravastatin     Physical Exam  Constitutional: He appears well-developed and well-nourished. No distress.  HENT:  Head: Normocephalic and atraumatic.  Eyes: Conjunctivae and EOM are normal. Pupils are equal, round, and reactive to light.  Cardiovascular: Normal rate, regular rhythm and  normal heart sounds.  Pulmonary/Chest: Effort normal and breath sounds normal. No respiratory distress.  Skin: Skin is warm and dry.  Psychiatric: He has a normal mood and affect. His behavior is normal.  Nursing note and vitals reviewed.   Results for orders placed or performed in visit on 02/05/17  CMP14+EGFR  Result Value Ref Range   Glucose 92 65 - 99 mg/dL   BUN 14 8 - 27 mg/dL   Creatinine, Ser 1.72 (H) 0.76 - 1.27 mg/dL   GFR calc non Af Amer 41 (L) >59 mL/min/1.73   GFR calc Af Amer 47 (L) >59 mL/min/1.73   BUN/Creatinine Ratio 8 (L) 10 - 24   Sodium 137 134 - 144 mmol/L   Potassium 4.1 3.5 - 5.2 mmol/L   Chloride 98 96 - 106 mmol/L   CO2 24 20 - 29 mmol/L   Calcium 9.7 8.6 - 10.2 mg/dL   Total Protein 7.5 6.0 - 8.5 g/dL   Albumin 4.2 3.6 - 4.8 g/dL   Globulin, Total 3.3 1.5 - 4.5 g/dL   Albumin/Globulin Ratio 1.3 1.2 - 2.2   Bilirubin Total 0.3 0.0 - 1.2 mg/dL   Alkaline Phosphatase 81 39 - 117 IU/L   AST 19 0 - 40 IU/L   ALT 10 0 - 44 IU/L  CBC with Differential/Platelet  Result Value Ref Range   WBC 6.0 3.4 - 10.8 x10E3/uL   RBC 4.21 4.14 - 5.80 x10E6/uL   Hemoglobin 11.8 (L) 13.0 - 17.7 g/dL   Hematocrit 37.5 37.5 - 51.0 %   MCV 89 79 - 97 fL   MCH 28.0 26.6 - 33.0 pg   MCHC 31.5 31.5 - 35.7 g/dL   RDW 14.7 12.3 - 15.4 %   Platelets 276 150 - 379 x10E3/uL   Neutrophils 51 Not Estab. %   Lymphs 35 Not Estab. %   Monocytes 7 Not Estab. %   Eos 6 Not Estab. %   Basos 1 Not Estab. %    Neutrophils Absolute 3.1 1.4 - 7.0 x10E3/uL   Lymphocytes Absolute 2.1 0.7 - 3.1 x10E3/uL   Monocytes Absolute 0.4 0.1 - 0.9 x10E3/uL   EOS (ABSOLUTE) 0.3 0.0 - 0.4 x10E3/uL   Basophils Absolute 0.1 0.0 - 0.2 x10E3/uL   Immature Granulocytes 0 Not Estab. %   Immature Grans (Abs) 0.0 0.0 - 0.1 x10E3/uL  Lipid panel  Result Value Ref Range   Cholesterol, Total 161 100 - 199 mg/dL   Triglycerides 138 0 - 149 mg/dL   HDL 28 (L) >39 mg/dL   VLDL Cholesterol Cal 28 5 - 40 mg/dL   LDL Calculated 105 (H) 0 - 99 mg/dL   Chol/HDL Ratio 5.8 (H) 0.0 - 5.0 ratio  Bayer DCA Hb A1c Waived  Result Value Ref Range   Bayer DCA Hb A1c Waived 5.5 <7.0 %      Assessment & Plan:   1. Type 2 diabetes mellitus with stage 2 chronic kidney disease, without long-term current use of insulin (HCC) - pioglitazone (ACTOS) 45 MG tablet; Take 1 tablet (45 mg total) every evening by mouth. TAKE ONE (1) TABLET EACH DAY  Dispense: 90 tablet; Refill: 3 - liraglutide (VICTOZA) 18 MG/3ML SOPN; Inject 0.3 mLs (1.8 mg total) daily into the skin. INJECT 0.3ML SQ DAILY  Dispense: 3 mL; Refill: 11 - CMP14+EGFR - CBC with Differential/Platelet - Lipid panel - Bayer DCA Hb A1c Waived  2. Hyperlipidemia associated with type 2 diabetes mellitus (HCC) - pravastatin (PRAVACHOL) 40 MG tablet;  Take 1 tablet (40 mg total) every evening by mouth. TAKE ONE (1) TABLET EACH DAY  Dispense: 90 tablet; Refill: 3 - CMP14+EGFR - CBC with Differential/Platelet - Lipid panel - Bayer DCA Hb A1c Waived  3. DDD (degenerative disc disease), lumbar - HYDROcodone-acetaminophen (NORCO) 10-325 MG tablet; Take 1 tablet every 8 (eight) hours as needed by mouth.  Dispense: 120 tablet; Refill: 0 - cyclobenzaprine (FLEXERIL) 10 MG tablet; Take 1 tablet (10 mg total) 3 (three) times daily as needed by mouth for muscle spasms (typically scheduled at bedtime).  Dispense: 90 tablet; Refill: 5  4. Chronic gout without tophus, unspecified cause, unspecified  site - Febuxostat (ULORIC) 80 MG TABS; Take 1 tablet (80 mg total) at bedtime by mouth. TAKE ONE (1) TABLET EACH DAY  Dispense: 90 tablet; Refill: 3 - allopurinol (ZYLOPRIM) 100 MG tablet; Take 1 tablet (100 mg total) 3 (three) times daily by mouth.  Dispense: 270 tablet; Refill: 3  5. Chronic nonseasonal allergic rhinitis due to pollen - cetirizine (ZYRTEC) 10 MG tablet; Take 1 tablet (10 mg total) daily as needed by mouth (for allergies.).  Dispense: 90 tablet; Refill: 3  6. Essential hypertension - carvedilol (COREG) 25 MG tablet; Take 1 tablet (25 mg total) 2 (two) times daily by mouth.  Dispense: 180 tablet; Refill: 3  7. Coronary artery disease involving native heart, angina presence unspecified, unspecified vessel or lesion type - carvedilol (COREG) 25 MG tablet; Take 1 tablet (25 mg total) 2 (two) times daily by mouth.  Dispense: 180 tablet; Refill: 3  8. Encounter for immunization - Flu vaccine HIGH DOSE PF    Current Outpatient Medications:  .  acetaminophen (TYLENOL) 325 MG tablet, Take 2 tablets (650 mg total) by mouth every 4 (four) hours as needed for headache or mild pain., Disp: , Rfl:  .  allopurinol (ZYLOPRIM) 100 MG tablet, Take 1 tablet (100 mg total) 3 (three) times daily by mouth., Disp: 270 tablet, Rfl: 3 .  ALPRAZolam (XANAX) 0.5 MG tablet, TAKE ONE TABLET TWICE DAILY AS NEEDED, Disp: 180 tablet, Rfl: 1 .  aspirin EC 81 MG tablet, Take 81 mg by mouth at bedtime. , Disp: , Rfl:  .  carvedilol (COREG) 25 MG tablet, Take 1 tablet (25 mg total) 2 (two) times daily by mouth., Disp: 180 tablet, Rfl: 3 .  cetirizine (ZYRTEC) 10 MG tablet, Take 1 tablet (10 mg total) daily as needed by mouth (for allergies.)., Disp: 90 tablet, Rfl: 3 .  clopidogrel (PLAVIX) 75 MG tablet, Take 1 tablet (75 mg total) at bedtime by mouth., Disp: 90 tablet, Rfl: 3 .  cyclobenzaprine (FLEXERIL) 10 MG tablet, Take 1 tablet (10 mg total) 3 (three) times daily as needed by mouth for muscle spasms  (typically scheduled at bedtime)., Disp: 90 tablet, Rfl: 5 .  Febuxostat (ULORIC) 80 MG TABS, Take 1 tablet (80 mg total) at bedtime by mouth. TAKE ONE (1) TABLET EACH DAY, Disp: 90 tablet, Rfl: 3 .  hydrochlorothiazide (HYDRODIURIL) 25 MG tablet, Take 1 tablet (25 mg total) by mouth daily., Disp: 180 tablet, Rfl: 3 .  HYDROcodone-acetaminophen (NORCO) 10-325 MG tablet, Take 1 tablet every 8 (eight) hours as needed by mouth., Disp: 120 tablet, Rfl: 0 .  isosorbide mononitrate (IMDUR) 30 MG 24 hr tablet, Take 1 tablet (30 mg total) by mouth daily., Disp: 90 tablet, Rfl: 3 .  liraglutide (VICTOZA) 18 MG/3ML SOPN, Inject 0.3 mLs (1.8 mg total) daily into the skin. INJECT 0.3ML SQ DAILY, Disp: 3 mL,  Rfl: 11 .  LYRICA 200 MG capsule, Take 1 capsule (200 mg total) by mouth 2 (two) times daily as needed (for pain.)., Disp: , Rfl:  .  nitroGLYCERIN (NITROSTAT) 0.4 MG SL tablet, Place 1 tablet (0.4 mg total) under the tongue every 5 (five) minutes as needed for chest pain., Disp: 25 tablet, Rfl: 3 .  pantoprazole (PROTONIX) 40 MG tablet, Take 1 tablet (40 mg total) by mouth daily at 6 (six) AM., Disp: 30 tablet, Rfl: 5 .  pioglitazone (ACTOS) 45 MG tablet, Take 1 tablet (45 mg total) every evening by mouth. TAKE ONE (1) TABLET EACH DAY, Disp: 90 tablet, Rfl: 3 .  pravastatin (PRAVACHOL) 40 MG tablet, Take 1 tablet (40 mg total) every evening by mouth. TAKE ONE (1) TABLET EACH DAY, Disp: 90 tablet, Rfl: 3 .  VENTOLIN HFA 108 (90 Base) MCG/ACT inhaler, Inhale 2 puffs into the lungs every 6 (six) hours as needed for wheezing or shortness of breath., Disp: 18 g, Rfl: 6 .  HYDROcodone-acetaminophen (NORCO) 10-325 MG tablet, Take 1 tablet every 6 (six) hours as needed by mouth., Disp: 120 tablet, Rfl: 0 .  HYDROcodone-acetaminophen (NORCO) 10-325 MG tablet, Take 1 tablet every 6 (six) hours as needed by mouth., Disp: 120 tablet, Rfl: 0 Continue all other maintenance medications as listed above.  Follow up  plan: Return in about 4 weeks (around 03/05/2017) for recheck lab NON fasting.  Educational handout given for [place] PA-C [facility] [address]  [place], [place] [address] [phone]   02/06/2017, 9:40 AM

## 2017-02-06 NOTE — Telephone Encounter (Signed)
Spoke to pt. He stated he wanted to wait until after the first one the year to reschedule his procedure. He stated he is too busy now and has too much going on.  Told pt to call when he is ready to schedule his procedure.  Routing to Dr. Gwenlyn Found as Juluis Rainier.

## 2017-02-20 ENCOUNTER — Ambulatory Visit (INDEPENDENT_AMBULATORY_CARE_PROVIDER_SITE_OTHER): Payer: Medicare Other | Admitting: Nurse Practitioner

## 2017-02-20 ENCOUNTER — Encounter: Payer: Self-pay | Admitting: Nurse Practitioner

## 2017-02-20 VITALS — BP 129/71 | HR 97 | Temp 97.2°F | Ht 66.0 in | Wt 187.0 lb

## 2017-02-20 DIAGNOSIS — I200 Unstable angina: Secondary | ICD-10-CM

## 2017-02-20 DIAGNOSIS — J069 Acute upper respiratory infection, unspecified: Secondary | ICD-10-CM | POA: Diagnosis not present

## 2017-02-20 MED ORDER — BENZONATATE 100 MG PO CAPS: 100.0000 mg | ORAL_CAPSULE | Freq: Three times a day (TID) | ORAL | 0 refills | Status: DC | PRN

## 2017-02-20 MED ORDER — AZITHROMYCIN 250 MG PO TABS: ORAL_TABLET | ORAL | 0 refills | Status: DC

## 2017-02-20 NOTE — Progress Notes (Signed)
Subjective:    Patient ID: Tommy Douglas, male    DOB: 06-Apr-1950, 66 y.o.   MRN: [id number]  HPI Patient comes in c/o cough. The cough has gotten so bad that he cannot sleep at night. He has been coughing for over a week. He has tried several OTC meds with no relief.    Review of Systems  Constitutional: Positive for fever (low grade intermittent). Negative for appetite change, chills and fatigue.  HENT: Positive for congestion, postnasal drip and rhinorrhea. Negative for ear pain, sinus pressure, sore throat, trouble swallowing and voice change.   Respiratory: Positive for cough (productive green mucus). Negative for shortness of breath.   Cardiovascular: Negative.   Gastrointestinal: Negative.   Genitourinary: Negative.   Neurological: Negative.   Psychiatric/Behavioral: Negative.   All other systems reviewed and are negative.      Objective:   Physical Exam  Constitutional: He is oriented to person, place, and time. He appears well-developed and well-nourished. No distress.  HENT:  Right Ear: Hearing, tympanic membrane, external ear and ear canal normal.  Left Ear: Hearing, tympanic membrane, external ear and ear canal normal.  Nose: Mucosal edema and rhinorrhea present. Right sinus exhibits no maxillary sinus tenderness and no frontal sinus tenderness. Left sinus exhibits no maxillary sinus tenderness and no frontal sinus tenderness.  Mouth/Throat: Uvula is midline, oropharynx is clear and moist and mucous membranes are normal.  Neck: Normal range of motion. Neck supple.  Cardiovascular: Normal rate and regular rhythm.  Pulmonary/Chest: Breath sounds normal. He is in respiratory distress. He has no wheezes. He has no rales.  Deep wet cough   Neurological: He is alert and oriented to person, place, and time.  Skin: Skin is warm.  Psychiatric: He has a normal mood and affect. His behavior is normal. Judgment and thought content normal.   BP 129/71   Pulse 97   Temp (!)  97.2 F (36.2 C) (Oral)   Ht 5\' 6"  (1.676 m)   Wt 187 lb (84.8 kg)   BMI 30.18 kg/m          Assessment & Plan:  1. Upper respiratory infection with cough and congestion 1. Take meds as prescribed 2. Use a cool mist humidifier especially during the winter months and when heat has been humid. 3. Use saline nose sprays frequently 4. Saline irrigations of the nose can be very helpful if done frequently.  * 4X daily for 1 week*  * Use of a nettie pot can be helpful with this. Follow directions with this* 5. Drink plenty of fluids 6. Keep thermostat turn down low 7.For any cough or congestion  Use plain Mucinex- regular strength or max strength is fine   * Children- consult with Pharmacist for dosing 8. For fever or aces or pains- take tylenol or ibuprofen appropriate for age and weight.  * for fevers greater than 101 orally you may alternate ibuprofen and tylenol every  3 hours.   Meds ordered this encounter  Medications  . azithromycin (ZITHROMAX Z-PAK) 250 MG tablet    Sig: As directed    Dispense:  6 tablet    Refill:  0    Order Specific Question:   Supervising Provider    Answer:   VINCENT, CAROL L [[id number]]  . benzonatate (TESSALON PERLES) 100 MG capsule    Sig: Take 1 capsule (100 mg total) by mouth 3 (three) times daily as needed for cough.    Dispense:  20 capsule  Refill:  0    Order Specific Question:   Supervising Provider    Answer:   Eustaquio Maize Wallace, FNP

## 2017-02-20 NOTE — Patient Instructions (Signed)

## 2017-03-05 ENCOUNTER — Ambulatory Visit: Payer: Self-pay | Admitting: Physician Assistant

## 2017-03-12 ENCOUNTER — Encounter (INDEPENDENT_AMBULATORY_CARE_PROVIDER_SITE_OTHER): Payer: Self-pay

## 2017-03-12 ENCOUNTER — Ambulatory Visit (INDEPENDENT_AMBULATORY_CARE_PROVIDER_SITE_OTHER): Payer: Medicare Other | Admitting: Physician Assistant

## 2017-03-12 ENCOUNTER — Encounter: Payer: Self-pay | Admitting: Physician Assistant

## 2017-03-12 VITALS — BP 126/59 | HR 111 | Temp 98.1°F | Ht 66.0 in | Wt 188.2 lb

## 2017-03-12 DIAGNOSIS — J189 Pneumonia, unspecified organism: Secondary | ICD-10-CM | POA: Diagnosis not present

## 2017-03-12 MED ORDER — CEFDINIR 300 MG PO CAPS: 300.0000 mg | ORAL_CAPSULE | Freq: Two times a day (BID) | ORAL | 0 refills | Status: DC

## 2017-03-12 MED ORDER — HYDROCODONE-HOMATROPINE 5-1.5 MG/5ML PO SYRP: 5.0000 mL | ORAL_SOLUTION | Freq: Four times a day (QID) | ORAL | 0 refills | Status: DC | PRN

## 2017-03-12 MED ORDER — METHYLPREDNISOLONE ACETATE 80 MG/ML IJ SUSP
80.0000 mg | Freq: Once | INTRAMUSCULAR | Status: AC
  Administered 2017-03-12: 80 mg via INTRAMUSCULAR

## 2017-03-12 MED ORDER — VENTOLIN HFA 108 (90 BASE) MCG/ACT IN AERS: 2.0000 | INHALATION_SPRAY | Freq: Four times a day (QID) | RESPIRATORY_TRACT | 6 refills | Status: DC | PRN

## 2017-03-12 NOTE — Progress Notes (Signed)
BP (!) 126/59   Pulse (!) 111   Temp 98.1 F (36.7 C) (Oral)   Ht 5\' 6"  (1.676 m)   Wt 188 lb 3.2 oz (85.4 kg)   BMI 30.38 kg/m    Subjective:    Patient ID: Tommy Douglas, male    DOB: [birth year], 66 y.o.   MRN: [id number]  HPI: Tommy Douglas is a 66 y.o. male presenting on 03/12/2017 for No chief complaint on file.  Patient with several days of progressing upper respiratory and bronchial symptoms. Initially there was more upper respiratory congestion. This progressed to having significant cough that is productive throughout the day and severe at night. There is occasional wheezing after coughing. Sometimes there is slight dyspnea on exertion. It is productive mucus that is yellow in color. There is blood in the last couple of days.   Relevant past medical, surgical, family and social history reviewed and updated as indicated. Allergies and medications reviewed and updated.  Past Medical History:  Diagnosis Date  . Anxiety   . Arthritis    "qwhere" (12/20/2016)  . Chronic lower back pain   . CKD (chronic kidney disease), stage IV ([place])   . Complication of anesthesia    "got the wrong kind of anesthesia ~ 2000 when they were looking down into my stomach"  . Coronary artery disease   . Gout   . Heart disease   . Hyperlipidemia   . Hypertension   . Pneumonia ~ 2015  . Seasonal allergies   . Type II diabetes mellitus ([place])     Past Surgical History:  Procedure Laterality Date  . CARDIAC CATHETERIZATION  12/20/2016  . CORONARY ANGIOPLASTY WITH STENT PLACEMENT  2004 X 2   "1st stent moved"  . CORONARY STENT INTERVENTION N/A 12/21/2016   Procedure: CORONARY STENT INTERVENTION;  Surgeon: Burnell Blanks, MD;  Location: [facility];  Service: Cardiovascular;  Laterality: N/A;  . ESOPHAGOGASTRODUODENOSCOPY  ~ 2000  . LAPAROSCOPIC CHOLECYSTECTOMY    . LEFT HEART CATH AND CORONARY ANGIOGRAPHY N/A 12/20/2016   Procedure: LEFT HEART CATH AND CORONARY  ANGIOGRAPHY;  Surgeon: Burnell Blanks, MD;  Location: [facility];  Service: Cardiovascular;  Laterality: N/A;    Review of Systems  Constitutional: Positive for fatigue. Negative for appetite change and fever.  HENT: Positive for sinus pressure and sore throat.   Eyes: Negative.  Negative for pain and visual disturbance.  Respiratory: Positive for cough, shortness of breath and wheezing. Negative for chest tightness.   Cardiovascular: Negative.  Negative for chest pain, palpitations and leg swelling.  Gastrointestinal: Negative.  Negative for abdominal pain, diarrhea, nausea and vomiting.  Endocrine: Negative.   Genitourinary: Negative.   Musculoskeletal: Positive for back pain and myalgias.  Skin: Negative.  Negative for color change and rash.  Neurological: Positive for headaches. Negative for weakness and numbness.  Psychiatric/Behavioral: Negative.     Allergies as of 03/12/2017      Reactions   Atorvastatin    All statins make him cough   Statins Cough   Pt is currently taking pravastatin       Medication List        Accurate as of 03/12/17  2:41 PM. Always use your most recent med list.          acetaminophen 325 MG tablet Commonly known as:  TYLENOL Take 2 tablets (650 mg total) by mouth every 4 (four) hours as needed for headache or mild pain.  allopurinol 100 MG tablet Commonly known as:  ZYLOPRIM Take 1 tablet (100 mg total) 3 (three) times daily by mouth.   ALPRAZolam 0.5 MG tablet Commonly known as:  XANAX TAKE ONE TABLET TWICE DAILY AS NEEDED   aspirin EC 81 MG tablet Take 81 mg by mouth at bedtime.   benzonatate 100 MG capsule Commonly known as:  TESSALON PERLES Take 1 capsule (100 mg total) by mouth 3 (three) times daily as needed for cough.   carvedilol 25 MG tablet Commonly known as:  COREG Take 1 tablet (25 mg total) 2 (two) times daily by mouth.   cefdinir 300 MG capsule Commonly known as:  OMNICEF Take 1 capsule (300 mg  total) by mouth 2 (two) times daily. 1 po BID   cetirizine 10 MG tablet Commonly known as:  ZYRTEC Take 1 tablet (10 mg total) daily as needed by mouth (for allergies.).   clopidogrel 75 MG tablet Commonly known as:  PLAVIX Take 1 tablet (75 mg total) at bedtime by mouth.   cyclobenzaprine 10 MG tablet Commonly known as:  FLEXERIL Take 1 tablet (10 mg total) 3 (three) times daily as needed by mouth for muscle spasms (typically scheduled at bedtime).   Febuxostat 80 MG Tabs Commonly known as:  ULORIC Take 1 tablet (80 mg total) at bedtime by mouth. TAKE ONE (1) TABLET EACH DAY   hydrochlorothiazide 25 MG tablet Commonly known as:  HYDRODIURIL Take 1 tablet (25 mg total) by mouth daily.   HYDROcodone-acetaminophen 10-325 MG tablet Commonly known as:  NORCO Take 1 tablet every 8 (eight) hours as needed by mouth.   HYDROcodone-acetaminophen 10-325 MG tablet Commonly known as:  NORCO Take 1 tablet every 6 (six) hours as needed by mouth.   HYDROcodone-acetaminophen 10-325 MG tablet Commonly known as:  NORCO Take 1 tablet every 6 (six) hours as needed by mouth.   isosorbide mononitrate 30 MG 24 hr tablet Commonly known as:  IMDUR Take 1 tablet (30 mg total) by mouth daily.   liraglutide 18 MG/3ML Sopn Commonly known as:  VICTOZA Inject 0.3 mLs (1.8 mg total) daily into the skin. INJECT 0.3ML SQ DAILY   LYRICA 200 MG capsule Generic drug:  pregabalin Take 1 capsule (200 mg total) by mouth 2 (two) times daily as needed (for pain.).   nitroGLYCERIN 0.4 MG SL tablet Commonly known as:  NITROSTAT Place 1 tablet (0.4 mg total) under the tongue every 5 (five) minutes as needed for chest pain.   pantoprazole 40 MG tablet Commonly known as:  PROTONIX Take 1 tablet (40 mg total) by mouth daily at 6 (six) AM.   pioglitazone 45 MG tablet Commonly known as:  ACTOS Take 1 tablet (45 mg total) every evening by mouth. TAKE ONE (1) TABLET EACH DAY   pravastatin 40 MG  tablet Commonly known as:  PRAVACHOL Take 1 tablet (40 mg total) every evening by mouth. TAKE ONE (1) TABLET EACH DAY   VENTOLIN HFA 108 (90 Base) MCG/ACT inhaler Generic drug:  albuterol Inhale 2 puffs into the lungs every 6 (six) hours as needed for wheezing or shortness of breath.          Objective:    BP (!) 126/59   Pulse (!) 111   Temp 98.1 F (36.7 C) (Oral)   Ht 5\' 6"  (1.676 m)   Wt 188 lb 3.2 oz (85.4 kg)   BMI 30.38 kg/m   Allergies  Allergen Reactions  . Atorvastatin     All statins  make him cough  . Statins Cough    Pt is currently taking pravastatin     Physical Exam  Constitutional: He appears well-developed and well-nourished.  HENT:  Head: Normocephalic and atraumatic.  Right Ear: Hearing and tympanic membrane normal.  Left Ear: Hearing and tympanic membrane normal.  Nose: Mucosal edema and sinus tenderness present. No nasal deformity. Right sinus exhibits frontal sinus tenderness. Left sinus exhibits frontal sinus tenderness.  Mouth/Throat: Posterior oropharyngeal erythema present.  Eyes: Conjunctivae and EOM are normal. Pupils are equal, round, and reactive to light. Right eye exhibits no discharge. Left eye exhibits no discharge.  Neck: Normal range of motion. Neck supple.  Cardiovascular: Normal rate, regular rhythm and normal heart sounds.  Pulmonary/Chest: Effort normal. No respiratory distress. He has no decreased breath sounds. He has wheezes. He has no rhonchi. He has no rales.  Abdominal: Soft. Bowel sounds are normal.  Musculoskeletal: Normal range of motion.  Skin: Skin is warm and dry.        Assessment & Plan:   1. Atypical pneumonia - methylPREDNISolone acetate (DEPO-MEDROL) injection 80 mg    Current Outpatient Medications:  .  acetaminophen (TYLENOL) 325 MG tablet, Take 2 tablets (650 mg total) by mouth every 4 (four) hours as needed for headache or mild pain., Disp: , Rfl:  .  allopurinol (ZYLOPRIM) 100 MG tablet, Take 1  tablet (100 mg total) 3 (three) times daily by mouth., Disp: 270 tablet, Rfl: 3 .  ALPRAZolam (XANAX) 0.5 MG tablet, TAKE ONE TABLET TWICE DAILY AS NEEDED, Disp: 180 tablet, Rfl: 1 .  aspirin EC 81 MG tablet, Take 81 mg by mouth at bedtime. , Disp: , Rfl:  .  carvedilol (COREG) 25 MG tablet, Take 1 tablet (25 mg total) 2 (two) times daily by mouth., Disp: 180 tablet, Rfl: 3 .  cetirizine (ZYRTEC) 10 MG tablet, Take 1 tablet (10 mg total) daily as needed by mouth (for allergies.)., Disp: 90 tablet, Rfl: 3 .  clopidogrel (PLAVIX) 75 MG tablet, Take 1 tablet (75 mg total) at bedtime by mouth., Disp: 90 tablet, Rfl: 3 .  cyclobenzaprine (FLEXERIL) 10 MG tablet, Take 1 tablet (10 mg total) 3 (three) times daily as needed by mouth for muscle spasms (typically scheduled at bedtime)., Disp: 90 tablet, Rfl: 5 .  Febuxostat (ULORIC) 80 MG TABS, Take 1 tablet (80 mg total) at bedtime by mouth. TAKE ONE (1) TABLET EACH DAY, Disp: 90 tablet, Rfl: 3 .  hydrochlorothiazide (HYDRODIURIL) 25 MG tablet, Take 1 tablet (25 mg total) by mouth daily., Disp: 180 tablet, Rfl: 3 .  HYDROcodone-acetaminophen (NORCO) 10-325 MG tablet, Take 1 tablet every 8 (eight) hours as needed by mouth., Disp: 120 tablet, Rfl: 0 .  HYDROcodone-acetaminophen (NORCO) 10-325 MG tablet, Take 1 tablet every 6 (six) hours as needed by mouth., Disp: 120 tablet, Rfl: 0 .  HYDROcodone-acetaminophen (NORCO) 10-325 MG tablet, Take 1 tablet every 6 (six) hours as needed by mouth., Disp: 120 tablet, Rfl: 0 .  isosorbide mononitrate (IMDUR) 30 MG 24 hr tablet, Take 1 tablet (30 mg total) by mouth daily., Disp: 90 tablet, Rfl: 3 .  liraglutide (VICTOZA) 18 MG/3ML SOPN, Inject 0.3 mLs (1.8 mg total) daily into the skin. INJECT 0.3ML SQ DAILY, Disp: 3 mL, Rfl: 11 .  LYRICA 200 MG capsule, Take 1 capsule (200 mg total) by mouth 2 (two) times daily as needed (for pain.)., Disp: , Rfl:  .  nitroGLYCERIN (NITROSTAT) 0.4 MG SL tablet, Place 1 tablet (0.4 mg  total) under the tongue every 5 (five) minutes as needed for chest pain., Disp: 25 tablet, Rfl: 3 .  pantoprazole (PROTONIX) 40 MG tablet, Take 1 tablet (40 mg total) by mouth daily at 6 (six) AM., Disp: 30 tablet, Rfl: 5 .  pioglitazone (ACTOS) 45 MG tablet, Take 1 tablet (45 mg total) every evening by mouth. TAKE ONE (1) TABLET EACH DAY, Disp: 90 tablet, Rfl: 3 .  pravastatin (PRAVACHOL) 40 MG tablet, Take 1 tablet (40 mg total) every evening by mouth. TAKE ONE (1) TABLET EACH DAY, Disp: 90 tablet, Rfl: 3 .  VENTOLIN HFA 108 (90 Base) MCG/ACT inhaler, Inhale 2 puffs into the lungs every 6 (six) hours as needed for wheezing or shortness of breath., Disp: 18 g, Rfl: 6 .  benzonatate (TESSALON PERLES) 100 MG capsule, Take 1 capsule (100 mg total) by mouth 3 (three) times daily as needed for cough. (Patient not taking: Reported on 03/12/2017), Disp: 20 capsule, Rfl: 0 .  cefdinir (OMNICEF) 300 MG capsule, Take 1 capsule (300 mg total) by mouth 2 (two) times daily. 1 po BID, Disp: 20 capsule, Rfl: 0  Current Facility-Administered Medications:  .  methylPREDNISolone acetate (DEPO-MEDROL) injection 80 mg, 80 mg, Intramuscular, Once, Tisha Cline S, PA-C Continue all other maintenance medications as listed above.  Follow up plan: No Follow-up on file.  Educational handout given for [place] PA-C [facility] [address]  [place], [place] [address] [phone]   03/12/2017, 2:41 PM

## 2017-03-12 NOTE — Patient Instructions (Signed)
In a few days you may receive a survey in the mail or online from [employer] regarding your visit with us today. Please take a moment to fill this out. Your feedback is very important to our whole office. It can help us better understand your needs as well as improve your experience and satisfaction. Thank you for taking your time to complete it. We care about you.  Mayme Profeta, PA-C  

## 2017-03-29 ENCOUNTER — Ambulatory Visit (INDEPENDENT_AMBULATORY_CARE_PROVIDER_SITE_OTHER): Payer: Medicare Other | Admitting: Physician Assistant

## 2017-03-29 ENCOUNTER — Encounter: Payer: Self-pay | Admitting: Physician Assistant

## 2017-03-29 ENCOUNTER — Ambulatory Visit (INDEPENDENT_AMBULATORY_CARE_PROVIDER_SITE_OTHER): Payer: Medicare Other

## 2017-03-29 VITALS — BP 145/75 | HR 92 | Temp 97.0°F | Ht 66.0 in | Wt 189.0 lb

## 2017-03-29 DIAGNOSIS — R059 Cough, unspecified: Secondary | ICD-10-CM

## 2017-03-29 DIAGNOSIS — R042 Hemoptysis: Secondary | ICD-10-CM

## 2017-03-29 DIAGNOSIS — R05 Cough: Secondary | ICD-10-CM | POA: Diagnosis not present

## 2017-03-29 DIAGNOSIS — J40 Bronchitis, not specified as acute or chronic: Secondary | ICD-10-CM | POA: Diagnosis not present

## 2017-03-29 MED ORDER — PREDNISONE 10 MG PO TABS: 10.0000 mg | ORAL_TABLET | Freq: Every day | ORAL | 0 refills | Status: DC

## 2017-03-29 MED ORDER — CEFTRIAXONE SODIUM 1 G IJ SOLR
1.0000 g | Freq: Once | INTRAMUSCULAR | Status: AC
  Administered 2017-03-29: 1 g via INTRAMUSCULAR

## 2017-03-29 NOTE — Progress Notes (Signed)
BP (!) 145/75   Pulse 92   Temp (!) 97 F (36.1 C) (Oral)   Ht 5\' 6"  (1.676 m)   Wt 189 lb (85.7 kg)   SpO2 99%   BMI 30.51 kg/m    Subjective:    Patient ID: Tommy Douglas, male    DOB: Sep 01, 1950, 67 y.o.   MRN: [id number]  HPI: Tommy Douglas is a 67 y.o. male presenting on 03/29/2017 for Cough (coughing up blood- and it is getting worse)  Patient comes in today for recheck on his cough.  It has continued to be productive and has dark red blood at times.  He denies any severe wheezing or fever.  He had been treated approximately 2 weeks ago with Omnicef, Depo-Medrol injection and Hycodan.  He does have albuterol and is using it 4 times a day.  Relevant past medical, surgical, family and social history reviewed and updated as indicated. Allergies and medications reviewed and updated.  Past Medical History:  Diagnosis Date  . Anxiety   . Arthritis    "qwhere" (12/20/2016)  . Chronic lower back pain   . CKD (chronic kidney disease), stage IV ([place])   . Complication of anesthesia    "got the wrong kind of anesthesia ~ 2000 when they were looking down into my stomach"  . Coronary artery disease   . Gout   . Heart disease   . Hyperlipidemia   . Hypertension   . Pneumonia ~ 2015  . Seasonal allergies   . Type II diabetes mellitus ([place])     Past Surgical History:  Procedure Laterality Date  . CARDIAC CATHETERIZATION  12/20/2016  . CORONARY ANGIOPLASTY WITH STENT PLACEMENT  2004 X 2   "1st stent moved"  . CORONARY STENT INTERVENTION N/A 12/21/2016   Procedure: CORONARY STENT INTERVENTION;  Surgeon: Burnell Blanks, MD;  Location: [facility];  Service: Cardiovascular;  Laterality: N/A;  . ESOPHAGOGASTRODUODENOSCOPY  ~ 2000  . LAPAROSCOPIC CHOLECYSTECTOMY    . LEFT HEART CATH AND CORONARY ANGIOGRAPHY N/A 12/20/2016   Procedure: LEFT HEART CATH AND CORONARY ANGIOGRAPHY;  Surgeon: Burnell Blanks, MD;  Location: [facility];  Service:  Cardiovascular;  Laterality: N/A;    Review of Systems  Constitutional: Positive for fatigue. Negative for appetite change and fever.  HENT: Positive for sinus pressure and sore throat.   Eyes: Negative.  Negative for pain and visual disturbance.  Respiratory: Positive for cough, shortness of breath and wheezing. Negative for chest tightness.   Cardiovascular: Negative.  Negative for chest pain, palpitations and leg swelling.  Gastrointestinal: Negative.  Negative for abdominal pain, diarrhea, nausea and vomiting.  Endocrine: Negative.   Genitourinary: Negative.   Musculoskeletal: Negative for back pain and myalgias.  Skin: Negative.  Negative for color change and rash.  Neurological: Positive for headaches. Negative for weakness and numbness.  Psychiatric/Behavioral: Negative.     Allergies as of 03/29/2017      Reactions   Atorvastatin    All statins make him cough   Statins Cough   Pt is currently taking pravastatin       Medication List        Accurate as of 03/29/17  5:15 PM. Always use your most recent med list.          acetaminophen 325 MG tablet Commonly known as:  TYLENOL Take 2 tablets (650 mg total) by mouth every 4 (four) hours as needed for headache or mild pain.  allopurinol 100 MG tablet Commonly known as:  ZYLOPRIM Take 1 tablet (100 mg total) 3 (three) times daily by mouth.   ALPRAZolam 0.5 MG tablet Commonly known as:  XANAX TAKE ONE TABLET TWICE DAILY AS NEEDED   aspirin EC 81 MG tablet Take 81 mg by mouth at bedtime.   carvedilol 25 MG tablet Commonly known as:  COREG Take 1 tablet (25 mg total) 2 (two) times daily by mouth.   cefdinir 300 MG capsule Commonly known as:  OMNICEF Take 1 capsule (300 mg total) by mouth 2 (two) times daily. 1 po BID   cetirizine 10 MG tablet Commonly known as:  ZYRTEC Take 1 tablet (10 mg total) daily as needed by mouth (for allergies.).   clopidogrel 75 MG tablet Commonly known as:  PLAVIX Take 1 tablet  (75 mg total) at bedtime by mouth.   cyclobenzaprine 10 MG tablet Commonly known as:  FLEXERIL Take 1 tablet (10 mg total) 3 (three) times daily as needed by mouth for muscle spasms (typically scheduled at bedtime).   Febuxostat 80 MG Tabs Commonly known as:  ULORIC Take 1 tablet (80 mg total) at bedtime by mouth. TAKE ONE (1) TABLET EACH DAY   hydrochlorothiazide 25 MG tablet Commonly known as:  HYDRODIURIL Take 1 tablet (25 mg total) by mouth daily.   HYDROcodone-acetaminophen 10-325 MG tablet Commonly known as:  NORCO Take 1 tablet every 8 (eight) hours as needed by mouth.   HYDROcodone-acetaminophen 10-325 MG tablet Commonly known as:  NORCO Take 1 tablet every 6 (six) hours as needed by mouth.   HYDROcodone-acetaminophen 10-325 MG tablet Commonly known as:  NORCO Take 1 tablet every 6 (six) hours as needed by mouth.   HYDROcodone-homatropine 5-1.5 MG/5ML syrup Commonly known as:  HYCODAN Take 5-10 mLs by mouth every 6 (six) hours as needed.   isosorbide mononitrate 30 MG 24 hr tablet Commonly known as:  IMDUR Take 1 tablet (30 mg total) by mouth daily.   liraglutide 18 MG/3ML Sopn Commonly known as:  VICTOZA Inject 0.3 mLs (1.8 mg total) daily into the skin. INJECT 0.3ML SQ DAILY   LYRICA 200 MG capsule Generic drug:  pregabalin Take 1 capsule (200 mg total) by mouth 2 (two) times daily as needed (for pain.).   nitroGLYCERIN 0.4 MG SL tablet Commonly known as:  NITROSTAT Place 1 tablet (0.4 mg total) under the tongue every 5 (five) minutes as needed for chest pain.   pantoprazole 40 MG tablet Commonly known as:  PROTONIX Take 1 tablet (40 mg total) by mouth daily at 6 (six) AM.   pioglitazone 45 MG tablet Commonly known as:  ACTOS Take 1 tablet (45 mg total) every evening by mouth. TAKE ONE (1) TABLET EACH DAY   pravastatin 40 MG tablet Commonly known as:  PRAVACHOL Take 1 tablet (40 mg total) every evening by mouth. TAKE ONE (1) TABLET EACH DAY     predniSONE 10 MG tablet Commonly known as:  DELTASONE Take 1-2 tablets (10-20 mg total) by mouth daily with breakfast.   VENTOLIN HFA 108 (90 Base) MCG/ACT inhaler Generic drug:  albuterol Inhale 2 puffs into the lungs every 6 (six) hours as needed for wheezing or shortness of breath.          Objective:    BP (!) 145/75   Pulse 92   Temp (!) 97 F (36.1 C) (Oral)   Ht 5\' 6"  (1.676 m)   Wt 189 lb (85.7 kg)   SpO2 99%  BMI 30.51 kg/m   Allergies  Allergen Reactions  . Atorvastatin     All statins make him cough  . Statins Cough    Pt is currently taking pravastatin     Physical Exam  Constitutional: He appears well-developed and well-nourished.  HENT:  Head: Normocephalic and atraumatic.  Right Ear: Hearing and tympanic membrane normal.  Left Ear: Hearing and tympanic membrane normal.  Nose: Mucosal edema and sinus tenderness present. No nasal deformity. Right sinus exhibits frontal sinus tenderness. Left sinus exhibits frontal sinus tenderness.  Mouth/Throat: Posterior oropharyngeal erythema present.  Eyes: Conjunctivae and EOM are normal. Pupils are equal, round, and reactive to light. Right eye exhibits no discharge. Left eye exhibits no discharge.  Neck: Normal range of motion. Neck supple.  Cardiovascular: Normal rate, regular rhythm and normal heart sounds.  Pulmonary/Chest: Effort normal. No respiratory distress. He has no decreased breath sounds. He has wheezes. He has no rhonchi. He has no rales.  Abdominal: Soft. Bowel sounds are normal.  Musculoskeletal: Normal range of motion.  Skin: Skin is warm and dry.        Assessment & Plan:   1. Cough - DG Chest 2 View; Future  2. Bronchitis - cefTRIAXone (ROCEPHIN) injection 1 g - predniSONE (DELTASONE) 10 MG tablet; Take 1-2 tablets (10-20 mg total) by mouth daily with breakfast.  Dispense: 60 tablet; Refill: 0  3. Cough with hemoptysis - Ambulatory referral to Pulmonology    Current Outpatient  Medications:  .  acetaminophen (TYLENOL) 325 MG tablet, Take 2 tablets (650 mg total) by mouth every 4 (four) hours as needed for headache or mild pain., Disp: , Rfl:  .  allopurinol (ZYLOPRIM) 100 MG tablet, Take 1 tablet (100 mg total) 3 (three) times daily by mouth., Disp: 270 tablet, Rfl: 3 .  ALPRAZolam (XANAX) 0.5 MG tablet, TAKE ONE TABLET TWICE DAILY AS NEEDED, Disp: 180 tablet, Rfl: 1 .  aspirin EC 81 MG tablet, Take 81 mg by mouth at bedtime. , Disp: , Rfl:  .  carvedilol (COREG) 25 MG tablet, Take 1 tablet (25 mg total) 2 (two) times daily by mouth., Disp: 180 tablet, Rfl: 3 .  cefdinir (OMNICEF) 300 MG capsule, Take 1 capsule (300 mg total) by mouth 2 (two) times daily. 1 po BID, Disp: 20 capsule, Rfl: 0 .  cetirizine (ZYRTEC) 10 MG tablet, Take 1 tablet (10 mg total) daily as needed by mouth (for allergies.)., Disp: 90 tablet, Rfl: 3 .  clopidogrel (PLAVIX) 75 MG tablet, Take 1 tablet (75 mg total) at bedtime by mouth., Disp: 90 tablet, Rfl: 3 .  cyclobenzaprine (FLEXERIL) 10 MG tablet, Take 1 tablet (10 mg total) 3 (three) times daily as needed by mouth for muscle spasms (typically scheduled at bedtime)., Disp: 90 tablet, Rfl: 5 .  Febuxostat (ULORIC) 80 MG TABS, Take 1 tablet (80 mg total) at bedtime by mouth. TAKE ONE (1) TABLET EACH DAY, Disp: 90 tablet, Rfl: 3 .  hydrochlorothiazide (HYDRODIURIL) 25 MG tablet, Take 1 tablet (25 mg total) by mouth daily., Disp: 180 tablet, Rfl: 3 .  HYDROcodone-acetaminophen (NORCO) 10-325 MG tablet, Take 1 tablet every 8 (eight) hours as needed by mouth., Disp: 120 tablet, Rfl: 0 .  HYDROcodone-acetaminophen (NORCO) 10-325 MG tablet, Take 1 tablet every 6 (six) hours as needed by mouth., Disp: 120 tablet, Rfl: 0 .  HYDROcodone-acetaminophen (NORCO) 10-325 MG tablet, Take 1 tablet every 6 (six) hours as needed by mouth., Disp: 120 tablet, Rfl: 0 .  HYDROcodone-homatropine (HYCODAN)  5-1.5 MG/5ML syrup, Take 5-10 mLs by mouth every 6 (six) hours as  needed., Disp: 240 mL, Rfl: 0 .  isosorbide mononitrate (IMDUR) 30 MG 24 hr tablet, Take 1 tablet (30 mg total) by mouth daily., Disp: 90 tablet, Rfl: 3 .  liraglutide (VICTOZA) 18 MG/3ML SOPN, Inject 0.3 mLs (1.8 mg total) daily into the skin. INJECT 0.3ML SQ DAILY, Disp: 3 mL, Rfl: 11 .  LYRICA 200 MG capsule, Take 1 capsule (200 mg total) by mouth 2 (two) times daily as needed (for pain.)., Disp: , Rfl:  .  nitroGLYCERIN (NITROSTAT) 0.4 MG SL tablet, Place 1 tablet (0.4 mg total) under the tongue every 5 (five) minutes as needed for chest pain., Disp: 25 tablet, Rfl: 3 .  pantoprazole (PROTONIX) 40 MG tablet, Take 1 tablet (40 mg total) by mouth daily at 6 (six) AM., Disp: 30 tablet, Rfl: 5 .  pioglitazone (ACTOS) 45 MG tablet, Take 1 tablet (45 mg total) every evening by mouth. TAKE ONE (1) TABLET EACH DAY, Disp: 90 tablet, Rfl: 3 .  pravastatin (PRAVACHOL) 40 MG tablet, Take 1 tablet (40 mg total) every evening by mouth. TAKE ONE (1) TABLET EACH DAY, Disp: 90 tablet, Rfl: 3 .  predniSONE (DELTASONE) 10 MG tablet, Take 1-2 tablets (10-20 mg total) by mouth daily with breakfast., Disp: 60 tablet, Rfl: 0 .  VENTOLIN HFA 108 (90 Base) MCG/ACT inhaler, Inhale 2 puffs into the lungs every 6 (six) hours as needed for wheezing or shortness of breath., Disp: 18 g, Rfl: 6 Continue all other maintenance medications as listed above.  Follow up plan: No Follow-up on file.  Educational handout given for [place] PA-C [place] [address]  [place],  [address] [phone]   03/29/2017, 5:15 PM

## 2017-03-29 NOTE — Patient Instructions (Signed)
In a few days you may receive a survey in the mail or online from [employer] regarding your visit with us today. Please take a moment to fill this out. Your feedback is very important to our whole office. It can help us better understand your needs as well as improve your experience and satisfaction. Thank you for taking your time to complete it. We care about you.  Esra Frankowski, PA-C  

## 2017-04-18 ENCOUNTER — Ambulatory Visit (INDEPENDENT_AMBULATORY_CARE_PROVIDER_SITE_OTHER): Payer: Medicare Other | Admitting: Physician Assistant

## 2017-04-18 ENCOUNTER — Encounter: Payer: Self-pay | Admitting: Physician Assistant

## 2017-04-18 VITALS — BP 124/68 | HR 93 | Temp 97.1°F | Ht 66.0 in | Wt 197.6 lb

## 2017-04-18 DIAGNOSIS — I2510 Atherosclerotic heart disease of native coronary artery without angina pectoris: Secondary | ICD-10-CM | POA: Diagnosis not present

## 2017-04-18 DIAGNOSIS — R609 Edema, unspecified: Secondary | ICD-10-CM

## 2017-04-18 DIAGNOSIS — I2583 Coronary atherosclerosis due to lipid rich plaque: Secondary | ICD-10-CM

## 2017-04-18 MED ORDER — TORSEMIDE 20 MG PO TABS: 20.0000 mg | ORAL_TABLET | Freq: Two times a day (BID) | ORAL | 2 refills | Status: DC

## 2017-04-18 NOTE — Patient Instructions (Signed)
Here are some potassium rich foods you can try.   Squash Sweet potato Potato, medium White beans Yogurt, fat-free Halibut, salmon 100% orange juice Broccoli Cantaloupe Banana Pork tenderloin Lentils Milk Pistachios, and other dried nuts Raisins Chicken breast Tuna, light   Molasses  

## 2017-04-20 NOTE — Progress Notes (Signed)
BP 124/68   Pulse 93   Temp (!) 97.1 F (36.2 C) (Oral)   Ht _0  (1.676 m)   Wt 197 lb 9.6 oz (89.6 kg)   BMI 31.89 kg/m    Subjective:    Patient ID: Tommy Douglas, male    DOB: May 30, 1950, 67 y.o.   MRN: [id number]  HPI: Tommy Douglas is a 67 y.o. male presenting on 04/18/2017 for Edema (feet and legs swelling x 4 days)  Over the past 4 days the patient has had more more swelling in his lower legs.  He denies any swelling in his hands or abdomen.  He denies any shortness of breath.  There have been no new changes to his medications.  He has been watching the salt intake to see if this can help.  Relevant past medical, surgical, family and social history reviewed and updated as indicated. Allergies and medications reviewed and updated.  Past Medical History:  Diagnosis Date  . Anxiety   . Arthritis    "qwhere" (12/20/2016)  . Chronic lower back pain   . CKD (chronic kidney disease), stage IV ([place])   . Complication of anesthesia    "got the wrong kind of anesthesia ~ 2000 when they were looking down into my stomach"  . Coronary artery disease   . Gout   . Heart disease   . Hyperlipidemia   . Hypertension   . Pneumonia ~ 2015  . Seasonal allergies   . Type II diabetes mellitus ([place])     Past Surgical History:  Procedure Laterality Date  . CARDIAC CATHETERIZATION  12/20/2016  . CORONARY ANGIOPLASTY WITH STENT PLACEMENT  2004 X 2   "1st stent moved"  . CORONARY STENT INTERVENTION N/A 12/21/2016   Procedure: CORONARY STENT INTERVENTION;  Surgeon: Burnell Blanks, MD;  Location: [facility];  Service: Cardiovascular;  Laterality: N/A;  . ESOPHAGOGASTRODUODENOSCOPY  ~ 2000  . LAPAROSCOPIC CHOLECYSTECTOMY    . LEFT HEART CATH AND CORONARY ANGIOGRAPHY N/A 12/20/2016   Procedure: LEFT HEART CATH AND CORONARY ANGIOGRAPHY;  Surgeon: Burnell Blanks, MD;  Location: [facility];  Service: Cardiovascular;  Laterality: N/A;    Review of Systems   Constitutional: Negative.  Negative for appetite change and fatigue.  HENT: Negative.   Eyes: Negative.  Negative for pain and visual disturbance.  Respiratory: Negative.  Negative for cough, choking, chest tightness, shortness of breath and wheezing.   Cardiovascular: Positive for leg swelling. Negative for chest pain and palpitations.  Gastrointestinal: Negative.  Negative for abdominal pain, diarrhea, nausea and vomiting.  Endocrine: Negative.   Genitourinary: Negative.   Musculoskeletal: Positive for arthralgias and joint swelling.  Skin: Negative.  Negative for color change and rash.  Neurological: Negative.  Negative for weakness, numbness and headaches.  Psychiatric/Behavioral: Negative.     Allergies as of 04/18/2017      Reactions   Atorvastatin    All statins make him cough   Statins Cough   Pt is currently taking pravastatin       Medication List        Accurate as of 04/18/17 11:59 PM. Always use your most recent med list.          acetaminophen 325 MG tablet Commonly known as:  TYLENOL Take 2 tablets (650 mg total) by mouth every 4 (four) hours as needed for headache or mild pain.   allopurinol 100 MG tablet Commonly known as:  ZYLOPRIM Take 1 tablet (100  mg total) 3 (three) times daily by mouth.   ALPRAZolam 0.5 MG tablet Commonly known as:  XANAX TAKE ONE TABLET TWICE DAILY AS NEEDED   aspirin EC 81 MG tablet Take 81 mg by mouth at bedtime.   carvedilol 25 MG tablet Commonly known as:  COREG Take 1 tablet (25 mg total) 2 (two) times daily by mouth.   cetirizine 10 MG tablet Commonly known as:  ZYRTEC Take 1 tablet (10 mg total) daily as needed by mouth (for allergies.).   clopidogrel 75 MG tablet Commonly known as:  PLAVIX Take 1 tablet (75 mg total) at bedtime by mouth.   cyclobenzaprine 10 MG tablet Commonly known as:  FLEXERIL Take 1 tablet (10 mg total) 3 (three) times daily as needed by mouth for muscle spasms (typically scheduled at  bedtime).   Febuxostat 80 MG Tabs Commonly known as:  ULORIC Take 1 tablet (80 mg total) at bedtime by mouth. TAKE ONE (1) TABLET EACH DAY   HYDROcodone-acetaminophen 10-325 MG tablet Commonly known as:  NORCO Take 1 tablet every 8 (eight) hours as needed by mouth.   HYDROcodone-acetaminophen 10-325 MG tablet Commonly known as:  NORCO Take 1 tablet every 6 (six) hours as needed by mouth.   HYDROcodone-acetaminophen 10-325 MG tablet Commonly known as:  NORCO Take 1 tablet every 6 (six) hours as needed by mouth.   isosorbide mononitrate 30 MG 24 hr tablet Commonly known as:  IMDUR Take 1 tablet (30 mg total) by mouth daily.   liraglutide 18 MG/3ML Sopn Commonly known as:  VICTOZA Inject 0.3 mLs (1.8 mg total) daily into the skin. INJECT 0.3ML SQ DAILY   LYRICA 200 MG capsule Generic drug:  pregabalin Take 1 capsule (200 mg total) by mouth 2 (two) times daily as needed (for pain.).   nitroGLYCERIN 0.4 MG SL tablet Commonly known as:  NITROSTAT Place 1 tablet (0.4 mg total) under the tongue every 5 (five) minutes as needed for chest pain.   pantoprazole 40 MG tablet Commonly known as:  PROTONIX Take 1 tablet (40 mg total) by mouth daily at 6 (six) AM.   pioglitazone 45 MG tablet Commonly known as:  ACTOS Take 1 tablet (45 mg total) every evening by mouth. TAKE ONE (1) TABLET EACH DAY   pravastatin 40 MG tablet Commonly known as:  PRAVACHOL Take 1 tablet (40 mg total) every evening by mouth. TAKE ONE (1) TABLET EACH DAY   predniSONE 10 MG tablet Commonly known as:  DELTASONE Take 1-2 tablets (10-20 mg total) by mouth daily with breakfast.   torsemide 20 MG tablet Commonly known as:  DEMADEX Take 1 tablet (20 mg total) by mouth 2 (two) times daily.   VENTOLIN HFA 108 (90 Base) MCG/ACT inhaler Generic drug:  albuterol Inhale 2 puffs into the lungs every 6 (six) hours as needed for wheezing or shortness of breath.          Objective:    BP 124/68   Pulse 93    Temp (!) 97.1 F (36.2 C) (Oral)   Ht _0  (1.676 m)   Wt 197 lb 9.6 oz (89.6 kg)   BMI 31.89 kg/m   Allergies  Allergen Reactions  . Atorvastatin     All statins make him cough  . Statins Cough    Pt is currently taking pravastatin     Physical Exam  Constitutional: He appears well-developed and well-nourished.  HENT:  Head: Normocephalic and atraumatic.  Eyes: Conjunctivae and EOM are normal. Pupils  are equal, round, and reactive to light.  Neck: Normal range of motion. Neck supple.  Cardiovascular: Normal rate, regular rhythm and normal heart sounds.  Pulmonary/Chest: Effort normal and breath sounds normal.  Abdominal: Soft. Bowel sounds are normal.  Musculoskeletal: Normal range of motion.  Skin: Skin is warm and dry.    Results for orders placed or performed in visit on 02/05/17  CMP14+EGFR  Result Value Ref Range   Glucose 92 65 - 99 mg/dL   BUN 14 8 - 27 mg/dL   Creatinine, Ser 1.72 (H) 0.76 - 1.27 mg/dL   GFR calc non Af Amer 41 (L) >59 mL/min/1.73   GFR calc Af Amer 47 (L) >59 mL/min/1.73   BUN/Creatinine Ratio 8 (L) 10 - 24   Sodium 137 134 - 144 mmol/L   Potassium 4.1 3.5 - 5.2 mmol/L   Chloride 98 96 - 106 mmol/L   CO2 24 20 - 29 mmol/L   Calcium 9.7 8.6 - 10.2 mg/dL   Total Protein 7.5 6.0 - 8.5 g/dL   Albumin 4.2 3.6 - 4.8 g/dL   Globulin, Total 3.3 1.5 - 4.5 g/dL   Albumin/Globulin Ratio 1.3 1.2 - 2.2   Bilirubin Total 0.3 0.0 - 1.2 mg/dL   Alkaline Phosphatase 81 39 - 117 IU/L   AST 19 0 - 40 IU/L   ALT 10 0 - 44 IU/L  CBC with Differential/Platelet  Result Value Ref Range   WBC 6.0 3.4 - 10.8 x10E3/uL   RBC 4.21 4.14 - 5.80 x10E6/uL   Hemoglobin 11.8 (L) 13.0 - 17.7 g/dL   Hematocrit 37.5 37.5 - 51.0 %   MCV 89 79 - 97 fL   MCH 28.0 26.6 - 33.0 pg   MCHC 31.5 31.5 - 35.7 g/dL   RDW 14.7 12.3 - 15.4 %   Platelets 276 150 - 379 x10E3/uL   Neutrophils 51 Not Estab. %   Lymphs 35 Not Estab. %   Monocytes 7 Not Estab. %   Eos 6 Not Estab. %     Basos 1 Not Estab. %   Neutrophils Absolute 3.1 1.4 - 7.0 x10E3/uL   Lymphocytes Absolute 2.1 0.7 - 3.1 x10E3/uL   Monocytes Absolute 0.4 0.1 - 0.9 x10E3/uL   EOS (ABSOLUTE) 0.3 0.0 - 0.4 x10E3/uL   Basophils Absolute 0.1 0.0 - 0.2 x10E3/uL   Immature Granulocytes 0 Not Estab. %   Immature Grans (Abs) 0.0 0.0 - 0.1 x10E3/uL  Lipid panel  Result Value Ref Range   Cholesterol, Total 161 100 - 199 mg/dL   Triglycerides 138 0 - 149 mg/dL   HDL 28 (L) >39 mg/dL   VLDL Cholesterol Cal 28 5 - 40 mg/dL   LDL Calculated 105 (H) 0 - 99 mg/dL   Chol/HDL Ratio 5.8 (H) 0.0 - 5.0 ratio  Bayer DCA Hb A1c Waived  Result Value Ref Range   Bayer DCA Hb A1c Waived 5.5 <7.0 %      Assessment & Plan:   1. Coronary artery disease due to lipid rich plaque - CBC with Differential/Platelet; Future - CMP14+EGFR; Future - Microalbumin / creatinine urine ratio; Future - Bayer DCA Hb A1c Waived; Future  2. Edema, unspecified type - CBC with Differential/Platelet; Future - CMP14+EGFR; Future - Microalbumin / creatinine urine ratio; Future - Bayer DCA Hb A1c Waived; Future - torsemide (DEMADEX) 20 MG tablet; Take 1 tablet (20 mg total) by mouth 2 (two) times daily.  Dispense: 60 tablet; Refill: 2    Current Outpatient Medications:  .  acetaminophen (TYLENOL) 325 MG tablet, Take 2 tablets (650 mg total) by mouth every 4 (four) hours as needed for headache or mild pain., Disp: , Rfl:  .  allopurinol (ZYLOPRIM) 100 MG tablet, Take 1 tablet (100 mg total) 3 (three) times daily by mouth., Disp: 270 tablet, Rfl: 3 .  ALPRAZolam (XANAX) 0.5 MG tablet, TAKE ONE TABLET TWICE DAILY AS NEEDED, Disp: 180 tablet, Rfl: 1 .  aspirin EC 81 MG tablet, Take 81 mg by mouth at bedtime. , Disp: , Rfl:  .  carvedilol (COREG) 25 MG tablet, Take 1 tablet (25 mg total) 2 (two) times daily by mouth., Disp: 180 tablet, Rfl: 3 .  cetirizine (ZYRTEC) 10 MG tablet, Take 1 tablet (10 mg total) daily as needed by mouth (for  allergies.)., Disp: 90 tablet, Rfl: 3 .  clopidogrel (PLAVIX) 75 MG tablet, Take 1 tablet (75 mg total) at bedtime by mouth., Disp: 90 tablet, Rfl: 3 .  cyclobenzaprine (FLEXERIL) 10 MG tablet, Take 1 tablet (10 mg total) 3 (three) times daily as needed by mouth for muscle spasms (typically scheduled at bedtime)., Disp: 90 tablet, Rfl: 5 .  Febuxostat (ULORIC) 80 MG TABS, Take 1 tablet (80 mg total) at bedtime by mouth. TAKE ONE (1) TABLET EACH DAY, Disp: 90 tablet, Rfl: 3 .  HYDROcodone-acetaminophen (NORCO) 10-325 MG tablet, Take 1 tablet every 8 (eight) hours as needed by mouth., Disp: 120 tablet, Rfl: 0 .  HYDROcodone-acetaminophen (NORCO) 10-325 MG tablet, Take 1 tablet every 6 (six) hours as needed by mouth., Disp: 120 tablet, Rfl: 0 .  HYDROcodone-acetaminophen (NORCO) 10-325 MG tablet, Take 1 tablet every 6 (six) hours as needed by mouth., Disp: 120 tablet, Rfl: 0 .  isosorbide mononitrate (IMDUR) 30 MG 24 hr tablet, Take 1 tablet (30 mg total) by mouth daily., Disp: 90 tablet, Rfl: 3 .  liraglutide (VICTOZA) 18 MG/3ML SOPN, Inject 0.3 mLs (1.8 mg total) daily into the skin. INJECT 0.3ML SQ DAILY, Disp: 3 mL, Rfl: 11 .  LYRICA 200 MG capsule, Take 1 capsule (200 mg total) by mouth 2 (two) times daily as needed (for pain.)., Disp: , Rfl:  .  nitroGLYCERIN (NITROSTAT) 0.4 MG SL tablet, Place 1 tablet (0.4 mg total) under the tongue every 5 (five) minutes as needed for chest pain., Disp: 25 tablet, Rfl: 3 .  pantoprazole (PROTONIX) 40 MG tablet, Take 1 tablet (40 mg total) by mouth daily at 6 (six) AM., Disp: 30 tablet, Rfl: 5 .  pioglitazone (ACTOS) 45 MG tablet, Take 1 tablet (45 mg total) every evening by mouth. TAKE ONE (1) TABLET EACH DAY, Disp: 90 tablet, Rfl: 3 .  pravastatin (PRAVACHOL) 40 MG tablet, Take 1 tablet (40 mg total) every evening by mouth. TAKE ONE (1) TABLET EACH DAY, Disp: 90 tablet, Rfl: 3 .  predniSONE (DELTASONE) 10 MG tablet, Take 1-2 tablets (10-20 mg total) by mouth  daily with breakfast., Disp: 60 tablet, Rfl: 0 .  torsemide (DEMADEX) 20 MG tablet, Take 1 tablet (20 mg total) by mouth 2 (two) times daily., Disp: 60 tablet, Rfl: 2 .  VENTOLIN HFA 108 (90 Base) MCG/ACT inhaler, Inhale 2 puffs into the lungs every 6 (six) hours as needed for wheezing or shortness of breath., Disp: 18 g, Rfl: 6 Continue all other maintenance medications as listed above.  Follow up plan: Return in about 5 days (around 04/23/2017) for recheck.  Educational handout given for [place] PA-C [place] [address]  [place], [place] [address] [phone]   04/20/2017, 8:02 AM

## 2017-04-23 ENCOUNTER — Ambulatory Visit (INDEPENDENT_AMBULATORY_CARE_PROVIDER_SITE_OTHER): Payer: Medicare Other | Admitting: Physician Assistant

## 2017-04-23 ENCOUNTER — Encounter: Payer: Self-pay | Admitting: Physician Assistant

## 2017-04-23 VITALS — BP 125/69 | HR 85 | Temp 96.7°F | Ht 66.0 in | Wt 190.4 lb

## 2017-04-23 DIAGNOSIS — I2583 Coronary atherosclerosis due to lipid rich plaque: Secondary | ICD-10-CM | POA: Diagnosis not present

## 2017-04-23 DIAGNOSIS — R609 Edema, unspecified: Secondary | ICD-10-CM | POA: Diagnosis not present

## 2017-04-23 DIAGNOSIS — I10 Essential (primary) hypertension: Secondary | ICD-10-CM | POA: Diagnosis not present

## 2017-04-23 DIAGNOSIS — I2510 Atherosclerotic heart disease of native coronary artery without angina pectoris: Secondary | ICD-10-CM

## 2017-04-23 MED ORDER — CARVEDILOL 25 MG PO TABS: 12.5000 mg | ORAL_TABLET | Freq: Two times a day (BID) | ORAL | 3 refills | Status: DC

## 2017-04-23 NOTE — Progress Notes (Signed)
BP 125/69   Pulse 85   Temp (!) 96.7 F (35.9 C) (Oral)   Ht 5' 6"  (1.676 m)   Wt 190 lb 6.4 oz (86.4 kg)   BMI 30.73 kg/m    Subjective:    Patient ID: Tommy Douglas, male    DOB: 08/06/1950, 67 y.o.   MRN: [id number]  HPI: Tommy Douglas is a 67 y.o. male presenting on 04/23/2017 for Follow-up (edema)  Patient comes in after 1 week.  He is down 6 pounds.  He is tolerating the torsemide well.  We will draw labs today.  He denies any other symptoms at this time.  He denies any chest pain, shortness of breath, orthopnea.  This patient comes in with 1 day history of nausea and vomiting.  In the beginning nausea and vomiting were the only symptoms and halfway through more diarrhea.  Last time the patient ate was Sunday Positive exposure to others with gastroenteritis Denies fever. Denies blood.   Relevant past medical, surgical, family and social history reviewed and updated as indicated. Allergies and medications reviewed and updated.  Past Medical History:  Diagnosis Date  . Anxiety   . Arthritis    "qwhere" (12/20/2016)  . Chronic lower back pain   . CKD (chronic kidney disease), stage IV ([place])   . Complication of anesthesia    "got the wrong kind of anesthesia ~ 2000 when they were looking down into my stomach"  . Coronary artery disease   . Gout   . Heart disease   . Hyperlipidemia   . Hypertension   . Pneumonia ~ 2015  . Seasonal allergies   . Type II diabetes mellitus ([place])     Past Surgical History:  Procedure Laterality Date  . CARDIAC CATHETERIZATION  12/20/2016  . CORONARY ANGIOPLASTY WITH STENT PLACEMENT  2004 X 2   "1st stent moved"  . CORONARY STENT INTERVENTION N/A 12/21/2016   Procedure: CORONARY STENT INTERVENTION;  Surgeon: Burnell Blanks, MD;  Location: [facility];  Service: Cardiovascular;  Laterality: N/A;  . ESOPHAGOGASTRODUODENOSCOPY  ~ 2000  . LAPAROSCOPIC CHOLECYSTECTOMY    . LEFT HEART CATH AND CORONARY ANGIOGRAPHY N/A  12/20/2016   Procedure: LEFT HEART CATH AND CORONARY ANGIOGRAPHY;  Surgeon: Burnell Blanks, MD;  Location: [facility];  Service: Cardiovascular;  Laterality: N/A;    Review of Systems  Constitutional: Positive for chills and fatigue. Negative for appetite change and fever.  Eyes: Negative for pain and visual disturbance.  Respiratory: Negative.  Negative for cough, chest tightness, shortness of breath and wheezing.   Cardiovascular: Negative.  Negative for chest pain, palpitations and leg swelling.  Gastrointestinal: Positive for abdominal pain, diarrhea, nausea and vomiting.  Genitourinary: Negative.   Musculoskeletal: Positive for myalgias.  Skin: Negative.  Negative for color change and rash.  Neurological: Negative.  Negative for weakness, numbness and headaches.  Psychiatric/Behavioral: Negative.     Allergies as of 04/23/2017      Reactions   Atorvastatin    All statins make him cough   Statins Cough   Pt is currently taking pravastatin       Medication List        Accurate as of 04/23/17 11:59 PM. Always use your most recent med list.          acetaminophen 325 MG tablet Commonly known as:  TYLENOL Take 2 tablets (650 mg total) by mouth every 4 (four) hours as needed for headache or mild  pain.   allopurinol 100 MG tablet Commonly known as:  ZYLOPRIM Take 1 tablet (100 mg total) 3 (three) times daily by mouth.   ALPRAZolam 0.5 MG tablet Commonly known as:  XANAX TAKE ONE TABLET TWICE DAILY AS NEEDED   aspirin EC 81 MG tablet Take 81 mg by mouth at bedtime.   carvedilol 25 MG tablet Commonly known as:  COREG Take 0.5 tablets (12.5 mg total) by mouth 2 (two) times daily.   cetirizine 10 MG tablet Commonly known as:  ZYRTEC Take 1 tablet (10 mg total) daily as needed by mouth (for allergies.).   clopidogrel 75 MG tablet Commonly known as:  PLAVIX Take 1 tablet (75 mg total) at bedtime by mouth.   cyclobenzaprine 10 MG tablet Commonly known  as:  FLEXERIL Take 1 tablet (10 mg total) 3 (three) times daily as needed by mouth for muscle spasms (typically scheduled at bedtime).   Febuxostat 80 MG Tabs Commonly known as:  ULORIC Take 1 tablet (80 mg total) at bedtime by mouth. TAKE ONE (1) TABLET EACH DAY   HYDROcodone-acetaminophen 10-325 MG tablet Commonly known as:  NORCO Take 1 tablet every 8 (eight) hours as needed by mouth.   HYDROcodone-acetaminophen 10-325 MG tablet Commonly known as:  NORCO Take 1 tablet every 6 (six) hours as needed by mouth.   HYDROcodone-acetaminophen 10-325 MG tablet Commonly known as:  NORCO Take 1 tablet every 6 (six) hours as needed by mouth.   isosorbide mononitrate 30 MG 24 hr tablet Commonly known as:  IMDUR Take 1 tablet (30 mg total) by mouth daily.   liraglutide 18 MG/3ML Sopn Commonly known as:  VICTOZA Inject 0.3 mLs (1.8 mg total) daily into the skin. INJECT 0.3ML SQ DAILY   LYRICA 200 MG capsule Generic drug:  pregabalin Take 1 capsule (200 mg total) by mouth 2 (two) times daily as needed (for pain.).   nitroGLYCERIN 0.4 MG SL tablet Commonly known as:  NITROSTAT Place 1 tablet (0.4 mg total) under the tongue every 5 (five) minutes as needed for chest pain.   pantoprazole 40 MG tablet Commonly known as:  PROTONIX Take 1 tablet (40 mg total) by mouth daily at 6 (six) AM.   pioglitazone 45 MG tablet Commonly known as:  ACTOS Take 1 tablet (45 mg total) every evening by mouth. TAKE ONE (1) TABLET EACH DAY   pravastatin 40 MG tablet Commonly known as:  PRAVACHOL Take 1 tablet (40 mg total) every evening by mouth. TAKE ONE (1) TABLET EACH DAY   predniSONE 10 MG tablet Commonly known as:  DELTASONE Take 1-2 tablets (10-20 mg total) by mouth daily with breakfast.   torsemide 20 MG tablet Commonly known as:  DEMADEX Take 1 tablet (20 mg total) by mouth 2 (two) times daily.   VENTOLIN HFA 108 (90 Base) MCG/ACT inhaler Generic drug:  albuterol Inhale 2 puffs into the  lungs every 6 (six) hours as needed for wheezing or shortness of breath.          Objective:    BP 125/69   Pulse 85   Temp (!) 96.7 F (35.9 C) (Oral)   Ht 5' 6"  (1.676 m)   Wt 190 lb 6.4 oz (86.4 kg)   BMI 30.73 kg/m   Allergies  Allergen Reactions  . Atorvastatin     All statins make him cough  . Statins Cough    Pt is currently taking pravastatin     Physical Exam  Constitutional: He appears well-developed and  well-nourished. No distress.  HENT:  Head: Normocephalic and atraumatic.  Eyes: Conjunctivae and EOM are normal. Pupils are equal, round, and reactive to light.  Cardiovascular: Normal rate, regular rhythm and normal heart sounds.  1+ pitting edema pretibial  Pulmonary/Chest: Effort normal and breath sounds normal. No respiratory distress.  Abdominal: Bowel sounds are increased. There is generalized tenderness. There is no rebound and no guarding.  Skin: Skin is warm and dry. He is not diaphoretic.  Psychiatric: He has a normal mood and affect. His behavior is normal.  Nursing note and vitals reviewed.   Results for orders placed or performed in visit on 04/23/17  CMP14+EGFR  Result Value Ref Range   Glucose 163 (H) 65 - 99 mg/dL   BUN 30 (H) 8 - 27 mg/dL   Creatinine, Ser 2.41 (H) 0.76 - 1.27 mg/dL   GFR calc non Af Amer 27 (L) >59 mL/min/1.73   GFR calc Af Amer 31 (L) >59 mL/min/1.73   BUN/Creatinine Ratio 12 10 - 24   Sodium 141 134 - 144 mmol/L   Potassium 5.7 (H) 3.5 - 5.2 mmol/L   Chloride 96 96 - 106 mmol/L   CO2 28 20 - 29 mmol/L   Calcium 10.0 8.6 - 10.2 mg/dL   Total Protein 7.4 6.0 - 8.5 g/dL   Albumin 4.1 3.6 - 4.8 g/dL   Globulin, Total 3.3 1.5 - 4.5 g/dL   Albumin/Globulin Ratio 1.2 1.2 - 2.2   Bilirubin Total 0.3 0.0 - 1.2 mg/dL   Alkaline Phosphatase 99 39 - 117 IU/L   AST 20 0 - 40 IU/L   ALT 16 0 - 44 IU/L  CBC with Differential/Platelet  Result Value Ref Range   WBC 7.4 3.4 - 10.8 x10E3/uL   RBC 4.26 4.14 - 5.80 x10E6/uL    Hemoglobin 12.4 (L) 13.0 - 17.7 g/dL   Hematocrit 38.3 37.5 - 51.0 %   MCV 90 79 - 97 fL   MCH 29.1 26.6 - 33.0 pg   MCHC 32.4 31.5 - 35.7 g/dL   RDW 16.5 (H) 12.3 - 15.4 %   Platelets 222 150 - 379 x10E3/uL   Neutrophils 69 Not Estab. %   Lymphs 23 Not Estab. %   Monocytes 5 Not Estab. %   Eos 3 Not Estab. %   Basos 0 Not Estab. %   Neutrophils Absolute 5.2 1.4 - 7.0 x10E3/uL   Lymphocytes Absolute 1.7 0.7 - 3.1 x10E3/uL   Monocytes Absolute 0.4 0.1 - 0.9 x10E3/uL   EOS (ABSOLUTE) 0.2 0.0 - 0.4 x10E3/uL   Basophils Absolute 0.0 0.0 - 0.2 x10E3/uL   Immature Granulocytes 0 Not Estab. %   Immature Grans (Abs) 0.0 0.0 - 0.1 x10E3/uL      Assessment & Plan:   1. Essential hypertension - carvedilol (COREG) 25 MG tablet; Take 0.5 tablets (12.5 mg total) by mouth 2 (two) times daily.  Dispense: 180 tablet; Refill: 3  2. Coronary artery disease involving native heart, angina presence unspecified, unspecified vessel or lesion type - carvedilol (COREG) 25 MG tablet; Take 0.5 tablets (12.5 mg total) by mouth 2 (two) times daily.  Dispense: 180 tablet; Refill: 3  3. Edema, unspecified type - CMP14+EGFR - CBC with Differential/Platelet  4. Coronary artery disease due to lipid rich plaque - CMP14+EGFR - CBC with Differential/Platelet    Current Outpatient Medications:  .  acetaminophen (TYLENOL) 325 MG tablet, Take 2 tablets (650 mg total) by mouth every 4 (four) hours as needed for headache or  mild pain., Disp: , Rfl:  .  allopurinol (ZYLOPRIM) 100 MG tablet, Take 1 tablet (100 mg total) 3 (three) times daily by mouth., Disp: 270 tablet, Rfl: 3 .  ALPRAZolam (XANAX) 0.5 MG tablet, TAKE ONE TABLET TWICE DAILY AS NEEDED, Disp: 180 tablet, Rfl: 1 .  aspirin EC 81 MG tablet, Take 81 mg by mouth at bedtime. , Disp: , Rfl:  .  carvedilol (COREG) 25 MG tablet, Take 0.5 tablets (12.5 mg total) by mouth 2 (two) times daily., Disp: 180 tablet, Rfl: 3 .  cetirizine (ZYRTEC) 10 MG tablet, Take 1  tablet (10 mg total) daily as needed by mouth (for allergies.)., Disp: 90 tablet, Rfl: 3 .  clopidogrel (PLAVIX) 75 MG tablet, Take 1 tablet (75 mg total) at bedtime by mouth., Disp: 90 tablet, Rfl: 3 .  cyclobenzaprine (FLEXERIL) 10 MG tablet, Take 1 tablet (10 mg total) 3 (three) times daily as needed by mouth for muscle spasms (typically scheduled at bedtime)., Disp: 90 tablet, Rfl: 5 .  Febuxostat (ULORIC) 80 MG TABS, Take 1 tablet (80 mg total) at bedtime by mouth. TAKE ONE (1) TABLET EACH DAY, Disp: 90 tablet, Rfl: 3 .  HYDROcodone-acetaminophen (NORCO) 10-325 MG tablet, Take 1 tablet every 8 (eight) hours as needed by mouth., Disp: 120 tablet, Rfl: 0 .  HYDROcodone-acetaminophen (NORCO) 10-325 MG tablet, Take 1 tablet every 6 (six) hours as needed by mouth., Disp: 120 tablet, Rfl: 0 .  HYDROcodone-acetaminophen (NORCO) 10-325 MG tablet, Take 1 tablet every 6 (six) hours as needed by mouth., Disp: 120 tablet, Rfl: 0 .  isosorbide mononitrate (IMDUR) 30 MG 24 hr tablet, Take 1 tablet (30 mg total) by mouth daily., Disp: 90 tablet, Rfl: 3 .  liraglutide (VICTOZA) 18 MG/3ML SOPN, Inject 0.3 mLs (1.8 mg total) daily into the skin. INJECT 0.3ML SQ DAILY, Disp: 3 mL, Rfl: 11 .  LYRICA 200 MG capsule, Take 1 capsule (200 mg total) by mouth 2 (two) times daily as needed (for pain.)., Disp: , Rfl:  .  nitroGLYCERIN (NITROSTAT) 0.4 MG SL tablet, Place 1 tablet (0.4 mg total) under the tongue every 5 (five) minutes as needed for chest pain., Disp: 25 tablet, Rfl: 3 .  pantoprazole (PROTONIX) 40 MG tablet, Take 1 tablet (40 mg total) by mouth daily at 6 (six) AM., Disp: 30 tablet, Rfl: 5 .  pioglitazone (ACTOS) 45 MG tablet, Take 1 tablet (45 mg total) every evening by mouth. TAKE ONE (1) TABLET EACH DAY, Disp: 90 tablet, Rfl: 3 .  pravastatin (PRAVACHOL) 40 MG tablet, Take 1 tablet (40 mg total) every evening by mouth. TAKE ONE (1) TABLET EACH DAY, Disp: 90 tablet, Rfl: 3 .  predniSONE (DELTASONE) 10 MG  tablet, Take 1-2 tablets (10-20 mg total) by mouth daily with breakfast., Disp: 60 tablet, Rfl: 0 .  torsemide (DEMADEX) 20 MG tablet, Take 1 tablet (20 mg total) by mouth 2 (two) times daily., Disp: 60 tablet, Rfl: 2 .  VENTOLIN HFA 108 (90 Base) MCG/ACT inhaler, Inhale 2 puffs into the lungs every 6 (six) hours as needed for wheezing or shortness of breath., Disp: 18 g, Rfl: 6 Continue all other maintenance medications as listed above.  Follow up plan: Keep regular follow up  Educational handout given for [place] PA-C [facility] [address]  [place], [place] [address] [phone]   04/24/2017, 9:37 AM

## 2017-04-24 ENCOUNTER — Other Ambulatory Visit: Payer: Self-pay | Admitting: *Deleted

## 2017-04-24 DIAGNOSIS — E875 Hyperkalemia: Secondary | ICD-10-CM

## 2017-04-24 LAB — CMP14+EGFR
ALK PHOS: 99 IU/L (ref 39–117)
ALT: 16 IU/L (ref 0–44)
AST: 20 IU/L (ref 0–40)
Albumin/Globulin Ratio: 1.2 (ref 1.2–2.2)
Albumin: 4.1 g/dL (ref 3.6–4.8)
BILIRUBIN TOTAL: 0.3 mg/dL (ref 0.0–1.2)
BUN/Creatinine Ratio: 12 (ref 10–24)
BUN: 30 mg/dL — AB (ref 8–27)
CHLORIDE: 96 mmol/L (ref 96–106)
CO2: 28 mmol/L (ref 20–29)
Calcium: 10 mg/dL (ref 8.6–10.2)
Creatinine, Ser: 2.41 mg/dL — ABNORMAL HIGH (ref 0.76–1.27)
GFR calc Af Amer: 31 mL/min/{1.73_m2} — ABNORMAL LOW (ref >59)
GFR calc non Af Amer: 27 mL/min/{1.73_m2} — ABNORMAL LOW (ref >59)
GLUCOSE: 163 mg/dL — AB (ref 65–99)
Globulin, Total: 3.3 g/dL (ref 1.5–4.5)
Potassium: 5.7 mmol/L — ABNORMAL HIGH (ref 3.5–5.2)
Sodium: 141 mmol/L (ref 134–144)
Total Protein: 7.4 g/dL (ref 6.0–8.5)

## 2017-04-24 LAB — CBC WITH DIFFERENTIAL/PLATELET
BASOS ABS: 0 10*3/uL (ref 0.0–0.2)
Basos: 0 %
EOS (ABSOLUTE): 0.2 10*3/uL (ref 0.0–0.4)
Eos: 3 %
HEMOGLOBIN: 12.4 g/dL — AB (ref 13.0–17.7)
Hematocrit: 38.3 % (ref 37.5–51.0)
Immature Grans (Abs): 0 10*3/uL (ref 0.0–0.1)
Immature Granulocytes: 0 %
LYMPHS: 23 %
Lymphocytes Absolute: 1.7 10*3/uL (ref 0.7–3.1)
MCH: 29.1 pg (ref 26.6–33.0)
MCHC: 32.4 g/dL (ref 31.5–35.7)
MCV: 90 fL (ref 79–97)
MONOCYTES: 5 %
Monocytes Absolute: 0.4 10*3/uL (ref 0.1–0.9)
NEUTROS PCT: 69 %
Neutrophils Absolute: 5.2 10*3/uL (ref 1.4–7.0)
Platelets: 222 10*3/uL (ref 150–379)
RBC: 4.26 x10E6/uL (ref 4.14–5.80)
RDW: 16.5 % — ABNORMAL HIGH (ref 12.3–15.4)
WBC: 7.4 10*3/uL (ref 3.4–10.8)

## 2017-04-24 NOTE — Patient Instructions (Signed)
In a few days you may receive a survey in the mail or online from [employer] regarding your visit with us today. Please take a moment to fill this out. Your feedback is very important to our whole office. It can help us better understand your needs as well as improve your experience and satisfaction. Thank you for taking your time to complete it. We care about you.  Avana Kreiser, PA-C  

## 2017-04-26 ENCOUNTER — Other Ambulatory Visit: Payer: Medicare Other

## 2017-04-26 DIAGNOSIS — E875 Hyperkalemia: Secondary | ICD-10-CM

## 2017-04-26 DIAGNOSIS — R609 Edema, unspecified: Secondary | ICD-10-CM | POA: Diagnosis not present

## 2017-04-26 DIAGNOSIS — I2583 Coronary atherosclerosis due to lipid rich plaque: Secondary | ICD-10-CM

## 2017-04-26 DIAGNOSIS — I2510 Atherosclerotic heart disease of native coronary artery without angina pectoris: Secondary | ICD-10-CM | POA: Diagnosis not present

## 2017-04-26 DIAGNOSIS — E1122 Type 2 diabetes mellitus with diabetic chronic kidney disease: Secondary | ICD-10-CM | POA: Diagnosis not present

## 2017-04-26 LAB — BAYER DCA HB A1C WAIVED: HB A1C (BAYER DCA - WAIVED): 6.1 % (ref <7.0)

## 2017-04-27 LAB — BMP8+EGFR
BUN/Creatinine Ratio: 14 (ref 10–24)
BUN: 26 mg/dL (ref 8–27)
CALCIUM: 9.8 mg/dL (ref 8.6–10.2)
CHLORIDE: 98 mmol/L (ref 96–106)
CO2: 25 mmol/L (ref 20–29)
Creatinine, Ser: 1.83 mg/dL — ABNORMAL HIGH (ref 0.76–1.27)
GFR calc non Af Amer: 38 mL/min/{1.73_m2} — ABNORMAL LOW (ref >59)
GFR, EST AFRICAN AMERICAN: 43 mL/min/{1.73_m2} — AB (ref >59)
Glucose: 161 mg/dL — ABNORMAL HIGH (ref 65–99)
Potassium: 4.5 mmol/L (ref 3.5–5.2)
Sodium: 138 mmol/L (ref 134–144)

## 2017-04-27 LAB — MICROALBUMIN / CREATININE URINE RATIO
Creatinine, Urine: 92.2 mg/dL
Microalb/Creat Ratio: 175.7 mg/g creat — ABNORMAL HIGH (ref 0.0–30.0)
Microalbumin, Urine: 162 ug/mL

## 2017-05-01 ENCOUNTER — Other Ambulatory Visit: Payer: Self-pay | Admitting: *Deleted

## 2017-05-01 DIAGNOSIS — R7989 Other specified abnormal findings of blood chemistry: Secondary | ICD-10-CM

## 2017-05-16 ENCOUNTER — Other Ambulatory Visit: Payer: Self-pay | Admitting: Physician Assistant

## 2017-05-16 DIAGNOSIS — M5136 Other intervertebral disc degeneration, lumbar region: Secondary | ICD-10-CM

## 2017-05-16 MED ORDER — HYDROCODONE-ACETAMINOPHEN 10-325 MG PO TABS: 1.0000 | ORAL_TABLET | Freq: Four times a day (QID) | ORAL | 0 refills | Status: DC | PRN

## 2017-05-16 MED ORDER — ALPRAZOLAM 0.5 MG PO TABS: ORAL_TABLET | ORAL | 1 refills | Status: DC

## 2017-05-16 MED ORDER — HYDROCODONE-ACETAMINOPHEN 10-325 MG PO TABS: 1.0000 | ORAL_TABLET | Freq: Three times a day (TID) | ORAL | 0 refills | Status: DC | PRN

## 2017-05-24 ENCOUNTER — Other Ambulatory Visit: Payer: Self-pay | Admitting: Physician Assistant

## 2017-05-24 ENCOUNTER — Ambulatory Visit (INDEPENDENT_AMBULATORY_CARE_PROVIDER_SITE_OTHER): Payer: Medicare Other | Admitting: *Deleted

## 2017-05-24 ENCOUNTER — Telehealth: Payer: Self-pay | Admitting: *Deleted

## 2017-05-24 VITALS — BP 127/70 | HR 87 | Temp 97.7°F | Ht 66.0 in | Wt 197.0 lb

## 2017-05-24 DIAGNOSIS — H9190 Unspecified hearing loss, unspecified ear: Secondary | ICD-10-CM

## 2017-05-24 DIAGNOSIS — Z0000 Encounter for general adult medical examination without abnormal findings: Secondary | ICD-10-CM | POA: Diagnosis not present

## 2017-05-24 NOTE — Patient Instructions (Addendum)
  Tommy Douglas , Thank you for taking time to come for your Medicare Wellness Visit. I appreciate your ongoing commitment to your health goals. Please review the following plan we discussed and let me know if I can assist you in the future.   These are the goals we discussed: Goals    . Increase physical activity     Walk more for exercise     . Prevent falls    . Quit Smoking       This is a list of the screening recommended for you and due dates:  Health Maintenance  Topic Date Due  .  Hepatitis C: One time screening is recommended by Center for Disease Control  (CDC) for  adults born from [birth year] through 1965.   20-Feb-1951  . Complete foot exam   09/08/1960  . Eye exam for diabetics  09/08/1960  . Tetanus Vaccine  09/08/1969  . Colon Cancer Screening  09/08/2000  . Pneumonia vaccines (1 of 2 - PCV13) 09/09/2015  . Hemoglobin A1C  10/24/2017  . Urine Protein Check  04/26/2018  . Flu Shot  Completed    Keep follow up with Particia Nearing and other specialist  Review the advanced directives Schedule a eye exam we will give you Prevnar 22 (baby pneumonia) at your next visit with Glenard Haring (we are out of it today) We will discuss a referral to [facility] for your right ear

## 2017-05-24 NOTE — Telephone Encounter (Signed)
Order placed

## 2017-05-24 NOTE — Progress Notes (Signed)
Subjective:   Tommy Douglas is a 67 y.o. male who presents for an Initial Medicare Annual Wellness Visit. 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 He enjoys spending time with his grandchildren and doing yard work. He walks some for exercise, but not on a regular basis. He states that he eats 2 semi-healthy meals a day. He is not involved in the community, nor the church at this time. He lives at home with one of his sons. He has 7 children total and all but one, live local. He has 1 dog and today we discussed fall risk. He states that his health is a little worse than it was a year ago, because he is having some new leg trouble.   Cardiac Risk Factors include: male gender;advanced age (>31men, >75 women);diabetes mellitus;dyslipidemia;hypertension    Objective:    Today's Vitals   05/24/17 0848  BP: 127/70  Pulse: 87  Temp: 97.7 F (36.5 C)  TempSrc: Oral  Weight: 197 lb (89.4 kg)  Height: 5\' 6"  (1.676 m)   Body mass index is 31.8 kg/m.  Advanced Directives 05/24/2017 12/20/2016 12/20/2016  Does Patient Have a Medical Advance Directive? No - Yes  Type of Advance Directive - [place];Living will [place];Living will  Does patient want to make changes to medical advance directive? - No - Patient declined No - Patient declined  Copy of [place] in Chart? - No - copy requested No - copy requested  Would patient like information on creating a medical advance directive? Yes (MAU/Ambulatory/Procedural Areas - Information given) - -    Current Medications (verified) Outpatient Encounter Medications as of 05/24/2017  Medication Sig  . acetaminophen (TYLENOL) 325 MG tablet Take 2 tablets (650 mg total) by mouth every 4 (four) hours as needed for headache or mild pain.  [name] allopurinol (ZYLOPRIM) 100 MG tablet Take 1 tablet (100 mg total) 3 (three) times daily by mouth.  . ALPRAZolam (XANAX) 0.5 MG tablet TAKE ONE  TABLET TWICE DAILY AS NEEDED  . aspirin EC 81 MG tablet Take 81 mg by mouth at bedtime.   . carvedilol (COREG) 25 MG tablet Take 0.5 tablets (12.5 mg total) by mouth 2 (two) times daily.  . cetirizine (ZYRTEC) 10 MG tablet Take 1 tablet (10 mg total) daily as needed by mouth (for allergies.).  [name] clopidogrel (PLAVIX) 75 MG tablet Take 1 tablet (75 mg total) at bedtime by mouth.  . cyclobenzaprine (FLEXERIL) 10 MG tablet Take 1 tablet (10 mg total) 3 (three) times daily as needed by mouth for muscle spasms (typically scheduled at bedtime).  . Febuxostat (ULORIC) 80 MG TABS Take 1 tablet (80 mg total) at bedtime by mouth. TAKE ONE (1) TABLET EACH DAY  . HYDROcodone-acetaminophen (NORCO) 10-325 MG tablet Take 1 tablet by mouth every 8 (eight) hours as needed.  . isosorbide mononitrate (IMDUR) 30 MG 24 hr tablet Take 1 tablet (30 mg total) by mouth daily.  [name] liraglutide (VICTOZA) 18 MG/3ML SOPN Inject 0.3 mLs (1.8 mg total) daily into the skin. INJECT 0.3ML SQ DAILY  . LYRICA 200 MG capsule Take 1 capsule (200 mg total) by mouth 2 (two) times daily as needed (for pain.).  [name] pantoprazole (PROTONIX) 40 MG tablet Take 1 tablet (40 mg total) by mouth daily at 6 (six) AM.  . pioglitazone (ACTOS) 45 MG tablet Take 1 tablet (45 mg total) every evening by mouth. TAKE ONE (1) TABLET EACH DAY  .  pravastatin (PRAVACHOL) 40 MG tablet Take 1 tablet (40 mg total) every evening by mouth. TAKE ONE (1) TABLET EACH DAY  . predniSONE (DELTASONE) 10 MG tablet Take 1-2 tablets (10-20 mg total) by mouth daily with breakfast.  . VENTOLIN HFA 108 (90 Base) MCG/ACT inhaler Inhale 2 puffs into the lungs every 6 (six) hours as needed for wheezing or shortness of breath.  [name] HYDROcodone-acetaminophen (NORCO) 10-325 MG tablet Take 1 tablet by mouth every 6 (six) hours as needed. (Patient not taking: Reported on 05/24/2017)  . HYDROcodone-acetaminophen (NORCO) 10-325 MG tablet Take 1 tablet by mouth every 6 (six) hours as needed.  (Patient not taking: Reported on 05/24/2017)  . nitroGLYCERIN (NITROSTAT) 0.4 MG SL tablet Place 1 tablet (0.4 mg total) under the tongue every 5 (five) minutes as needed for chest pain. (Patient not taking: Reported on 05/24/2017)  . [DISCONTINUED] torsemide (DEMADEX) 20 MG tablet Take 1 tablet (20 mg total) by mouth 2 (two) times daily.   No facility-administered encounter medications on file as of 05/24/2017.     Allergies (verified) Atorvastatin and Statins   History: Past Medical History:  Diagnosis Date  . Anxiety   . Arthritis    "qwhere" (12/20/2016)  . Chronic lower back pain   . CKD (chronic kidney disease), stage IV ([place])   . Complication of anesthesia    "got the wrong kind of anesthesia ~ 2000 when they were looking down into my stomach"  . Coronary artery disease   . Gout   . Heart disease   . Hyperlipidemia   . Hypertension   . Pneumonia ~ 2015  . Seasonal allergies   . Type II diabetes mellitus ([place])    Past Surgical History:  Procedure Laterality Date  . CARDIAC CATHETERIZATION  12/20/2016  . CORONARY ANGIOPLASTY WITH STENT PLACEMENT  2004 X 2   "1st stent moved"  . CORONARY STENT INTERVENTION N/A 12/21/2016   Procedure: CORONARY STENT INTERVENTION;  Surgeon: Burnell Blanks, MD;  Location: [facility];  Service: Cardiovascular;  Laterality: N/A;  . ESOPHAGOGASTRODUODENOSCOPY  ~ 2000  . LAPAROSCOPIC CHOLECYSTECTOMY    . LEFT HEART CATH AND CORONARY ANGIOGRAPHY N/A 12/20/2016   Procedure: LEFT HEART CATH AND CORONARY ANGIOGRAPHY;  Surgeon: Burnell Blanks, MD;  Location: [facility];  Service: Cardiovascular;  Laterality: N/A;   Family History  Problem Relation Age of Onset  . Cancer Mother        male  . Diabetes Mother   . Stroke Father        x3  . Stroke Brother        x2   . Other Sister        bowel necrosis?   . Other Daughter        gout  . Other Son        gout  . Alcohol abuse Brother   . Other Sister         pneumonia  . Other Sister        pneumonia  . Cancer Daughter        male  . Other Daughter        fluid on brain - disabled    Social History   Socioeconomic History  . Marital status: Widowed    Spouse name: None  . Number of children: 7  . Years of education: None  . Highest education level: None  Social Needs  . Financial resource strain: None  . Food  insecurity - worry: None  . Food insecurity - inability: None  . Transportation needs - medical: None  . Transportation needs - non-medical: None  Occupational History  . Occupation: retired     Comment: Designer, television/film set and Manufacturing engineer co.  Tobacco Use  . Smoking status: Current Every Day Smoker    Packs/day: 0.50    Years: 44.00    Pack years: 22.00    Types: Cigarettes  . Smokeless tobacco: Never Used  Substance and Sexual Activity  . Alcohol use: No  . Drug use: No  . Sexual activity: Not Currently  Other Topics Concern  . None  Social History Narrative  . None   Tobacco Counseling Ready to quit: Not Answered Counseling given: Not Answered   Clinical Intake:                       Activities of Daily Living In your present state of health, do you have any difficulty performing the following activities: 05/24/2017 12/20/2016  Hearing? 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 Y -  Comment wears rx glasses  -  Difficulty concentrating or making decisions? N -  Walking or climbing stairs? Y -  Comment back pain / legs weak, gout  -  Dressing or bathing? N -  Doing errands, shopping? N N  Preparing Food and eating ? N -  Using the Toilet? N -  In the past six months, have you accidently leaked urine? N -  Do you have problems with loss of bowel control? N -  Managing your Medications? N -  Managing your Finances? N -  Housekeeping or managing your Housekeeping? N -  Some recent data might be hidden     Immunizations and Health Maintenance Immunization History  Administered  Date(s) Administered  . Influenza, High Dose Seasonal PF 01/10/2016, 02/05/2017   Health Maintenance Due  Topic Date Due  . Hepatitis C Screening  [birth year]  . FOOT EXAM  09/08/1960  . OPHTHALMOLOGY EXAM  09/08/1960  . TETANUS/TDAP  09/08/1969  . COLONOSCOPY  09/08/2000  . PNA vac Low Risk Adult (1 of 2 - PCV13) 09/09/2015    Patient Care Team: Theodoro Clock as PCP - General (Physician Assistant) Herminio Commons, MD as Attending Physician (Cardiology) Lorretta Harp, MD as Consulting Physician (Cardiology) Fran Lowes, MD as Consulting Physician (Nephrology)  Indicate any recent Medical Services you may have received from other than [facility] providers in the past year (date may be approximate).    Assessment:   This is a routine wellness examination for [place].  Hearing/Vision screen No exam data present  Dietary issues and exercise activities discussed: Current Exercise Habits: Home exercise routine, Type of exercise: walking, Time (Minutes): 15, Frequency (Times/Week): 3, Weekly Exercise (Minutes/Week): 45, Intensity: Mild, Exercise limited by: None identified;orthopedic condition(s)  Goals    . Increase physical activity     Walk more for exercise     . Prevent falls    . Quit Smoking      Depression Screen PHQ 2/9 Scores 05/24/2017 04/23/2017 04/18/2017 03/29/2017  PHQ - 2 Score 0 0 0 0    Fall Risk Fall Risk  05/24/2017 04/23/2017 04/18/2017 03/29/2017 03/12/2017  Falls in the past year? No No No No No    Is the patient's home free of loose throw rugs in walkways, pet beds, electrical cords, etc?   Fall hazards and risks were discussed today  Cognitive Function: MMSE - Mini Mental State Exam 05/24/2017  Orientation to time 5  Orientation to Place 5  Registration 3  Attention/ Calculation 5  Recall 3  Language- name 2 objects 2  Language- repeat 1  Language- follow 3 step command 3  Language- read & follow direction 1  Write a sentence 1  Copy  design 1  Total score 30        Screening Tests Health Maintenance  Topic Date Due  . Hepatitis C Screening  1950/08/15  . FOOT EXAM  09/08/1960  . OPHTHALMOLOGY EXAM  09/08/1960  . TETANUS/TDAP  09/08/1969  . COLONOSCOPY  09/08/2000  . PNA vac Low Risk Adult (1 of 2 - PCV13) 09/09/2015  . HEMOGLOBIN A1C  10/24/2017  . URINE MICROALBUMIN  04/26/2018  . INFLUENZA VACCINE  Completed    Qualifies for Shingles Vaccine? Denied today due to cost  Cancer Screenings: Lung: Low Dose CT Chest recommended if Age 66-80 years, 30 pack-year currently smoking OR have quit w/in 15years. Patient does qualify. Colorectal: due at next OV   Additional Screenings:  Hepatitis B/HIV/Syphillis: Hepatitis C Screening:  Denied      Plan:    he will Keep follow up with Particia Nearing and other specialist  He will Review the advanced directives He will Schedule a eye exam we will give you Prevnar 20 (baby pneumonia) at your next visit with Glenard Haring (we are out of it today) We will discuss a referral to [facility] for your right ear     I have personally reviewed and noted the following in the patient's chart:   . Medical and social history . Use of alcohol, tobacco or illicit drugs  . Current medications and supplements . Functional ability and status . Nutritional status . Physical activity . Advanced directives . List of other physicians . Hospitalizations, surgeries, and ER visits in previous 12 months . Vitals . Screenings to include cognitive, depression, and falls . Referrals and appointments  In addition, I have reviewed and discussed with patient certain preventive protocols, quality metrics, and best practice recommendations. A written personalized care plan for preventive services as well as general preventive health recommendations were provided to patient.     Shamaine Mulkern, Cameron Proud, LPN   1/74/0814   I have reviewed and agree with the above AWV documentation.   Terald Sleeper PA-C [facility] [address]  [place], [place] [address] [phone]

## 2017-06-18 ENCOUNTER — Other Ambulatory Visit: Payer: Self-pay | Admitting: Physician Assistant

## 2017-06-21 DIAGNOSIS — H90A22 Sensorineural hearing loss, unilateral, left ear, with restricted hearing on the contralateral side: Secondary | ICD-10-CM | POA: Diagnosis not present

## 2017-06-21 DIAGNOSIS — H90A31 Mixed conductive and sensorineural hearing loss, unilateral, right ear with restricted hearing on the contralateral side: Secondary | ICD-10-CM | POA: Diagnosis not present

## 2017-06-22 ENCOUNTER — Encounter: Payer: Self-pay | Admitting: Physician Assistant

## 2017-07-20 ENCOUNTER — Ambulatory Visit (INDEPENDENT_AMBULATORY_CARE_PROVIDER_SITE_OTHER): Payer: Medicare Other | Admitting: Physician Assistant

## 2017-07-20 ENCOUNTER — Encounter: Payer: Self-pay | Admitting: Physician Assistant

## 2017-07-20 VITALS — BP 132/62 | HR 97 | Temp 96.8°F | Ht 66.0 in | Wt 207.2 lb

## 2017-07-20 DIAGNOSIS — I739 Peripheral vascular disease, unspecified: Secondary | ICD-10-CM

## 2017-07-20 DIAGNOSIS — N182 Chronic kidney disease, stage 2 (mild): Secondary | ICD-10-CM

## 2017-07-20 DIAGNOSIS — R609 Edema, unspecified: Secondary | ICD-10-CM | POA: Diagnosis not present

## 2017-07-20 MED ORDER — DOXYCYCLINE HYCLATE 100 MG PO TABS: 100.0000 mg | ORAL_TABLET | Freq: Two times a day (BID) | ORAL | 0 refills | Status: DC

## 2017-07-20 MED ORDER — HYDROCHLOROTHIAZIDE 25 MG PO TABS: 25.0000 mg | ORAL_TABLET | Freq: Every day | ORAL | 3 refills | Status: DC

## 2017-07-20 MED ORDER — VENTOLIN HFA 108 (90 BASE) MCG/ACT IN AERS: 2.0000 | INHALATION_SPRAY | Freq: Four times a day (QID) | RESPIRATORY_TRACT | 6 refills | Status: DC | PRN

## 2017-07-20 NOTE — Progress Notes (Signed)
BP 132/62   Pulse 97   Temp (!) 96.8 F (36 C) (Oral)   Ht 5' 6"  (1.676 m)   Wt 207 lb 3.2 oz (94 kg)   BMI 33.44 kg/m    Subjective:    Patient ID: Tommy Douglas, male    DOB: 1951/02/06, 67 y.o.   MRN: [id number]  HPI: Tommy Douglas is a 67 y.o. male presenting on 07/20/2017 for chest congestion; Edema (bilateral leg swelling ); and Cough  Patient is having some bronchitis symptoms.  He has had this problem for about 3 weeks.  He is having a productive cough at times.  He also complains of significant edema in his lower legs and throughout his abdomen.  He has had this happen one other time before.  He had used torsemide in the past and it made him have too much dryness to his kidneys.  We are going to have him try hydrochlorothiazide only.  He will be coming back next week for recheck.  If anything gets worse he is to go to the hospital as quickly as possible.  Past Medical History:  Diagnosis Date  . Anxiety   . Arthritis    "qwhere" (12/20/2016)  . Chronic lower back pain   . CKD (chronic kidney disease), stage IV ([place])   . Complication of anesthesia    "got the wrong kind of anesthesia ~ 2000 when they were looking down into my stomach"  . Coronary artery disease   . Gout   . Heart disease   . Hyperlipidemia   . Hypertension   . Pneumonia ~ 2015  . Seasonal allergies   . Type II diabetes mellitus ([facility])    Relevant past medical, surgical, family and social history reviewed and updated as indicated. Interim medical history since our last visit reviewed. Allergies and medications reviewed and updated. DATA REVIEWED: CHART IN EPIC  Family History reviewed for pertinent findings.  Review of Systems  Constitutional: Positive for fatigue. Negative for appetite change.  HENT: Positive for sinus pressure and sore throat.   Eyes: Negative.  Negative for pain and visual disturbance.  Respiratory: Positive for shortness of breath and wheezing. Negative for cough and  chest tightness.   Cardiovascular: Negative.  Negative for chest pain, palpitations and leg swelling.  Gastrointestinal: Negative.  Negative for abdominal pain, diarrhea, nausea and vomiting.  Endocrine: Negative.   Genitourinary: Negative.   Musculoskeletal: Positive for back pain and myalgias.  Skin: Negative.  Negative for color change and rash.  Neurological: Positive for headaches. Negative for weakness and numbness.  Psychiatric/Behavioral: Negative.     Allergies as of 07/20/2017      Reactions   Atorvastatin    All statins make him cough   Statins Cough   Pt is currently taking pravastatin       Medication List        Accurate as of 07/20/17  1:50 PM. Always use your most recent med list.          acetaminophen 325 MG tablet Commonly known as:  TYLENOL Take 2 tablets (650 mg total) by mouth every 4 (four) hours as needed for headache or mild pain.   allopurinol 100 MG tablet Commonly known as:  ZYLOPRIM Take 1 tablet (100 mg total) 3 (three) times daily by mouth.   ALPRAZolam 0.5 MG tablet Commonly known as:  XANAX TAKE ONE TABLET TWICE DAILY AS NEEDED   aspirin EC 81 MG tablet Take 81 mg by mouth  at bedtime.   carvedilol 25 MG tablet Commonly known as:  COREG Take 0.5 tablets (12.5 mg total) by mouth 2 (two) times daily.   cetirizine 10 MG tablet Commonly known as:  ZYRTEC Take 1 tablet (10 mg total) daily as needed by mouth (for allergies.).   clopidogrel 75 MG tablet Commonly known as:  PLAVIX Take 1 tablet (75 mg total) at bedtime by mouth.   cyclobenzaprine 10 MG tablet Commonly known as:  FLEXERIL Take 1 tablet (10 mg total) 3 (three) times daily as needed by mouth for muscle spasms (typically scheduled at bedtime).   doxycycline 100 MG tablet Commonly known as:  VIBRA-TABS Take 1 tablet (100 mg total) by mouth 2 (two) times daily.   Febuxostat 80 MG Tabs Commonly known as:  ULORIC Take 1 tablet (80 mg total) at bedtime by mouth. TAKE ONE  (1) TABLET EACH DAY   hydrochlorothiazide 25 MG tablet Commonly known as:  HYDRODIURIL Take 1 tablet (25 mg total) by mouth daily.   HYDROcodone-acetaminophen 10-325 MG tablet Commonly known as:  NORCO Take 1 tablet by mouth every 8 (eight) hours as needed.   HYDROcodone-acetaminophen 10-325 MG tablet Commonly known as:  NORCO Take 1 tablet by mouth every 6 (six) hours as needed.   HYDROcodone-acetaminophen 10-325 MG tablet Commonly known as:  NORCO Take 1 tablet by mouth every 6 (six) hours as needed.   isosorbide mononitrate 30 MG 24 hr tablet Commonly known as:  IMDUR Take 1 tablet (30 mg total) by mouth daily.   liraglutide 18 MG/3ML Sopn Commonly known as:  VICTOZA Inject 0.3 mLs (1.8 mg total) daily into the skin. INJECT 0.3ML SQ DAILY   LYRICA 200 MG capsule Generic drug:  pregabalin Take 1 capsule (200 mg total) by mouth 2 (two) times daily as needed (for pain.).   nitroGLYCERIN 0.4 MG SL tablet Commonly known as:  NITROSTAT Place 1 tablet (0.4 mg total) under the tongue every 5 (five) minutes as needed for chest pain.   pantoprazole 40 MG tablet Commonly known as:  PROTONIX Take 1 tablet (40 mg total) by mouth daily at 6 (six) AM.   pioglitazone 45 MG tablet Commonly known as:  ACTOS Take 1 tablet (45 mg total) every evening by mouth. TAKE ONE (1) TABLET EACH DAY   pravastatin 40 MG tablet Commonly known as:  PRAVACHOL Take 1 tablet (40 mg total) every evening by mouth. TAKE ONE (1) TABLET EACH DAY   predniSONE 10 MG tablet Commonly known as:  DELTASONE Take 1-2 tablets (10-20 mg total) by mouth daily with breakfast.   ULTICARE MICRO PEN NEEDLES 32G X 4 MM Misc Generic drug:  Insulin Pen Needle EVERY DAY   VENTOLIN HFA 108 (90 Base) MCG/ACT inhaler Generic drug:  albuterol Inhale 2 puffs into the lungs every 6 (six) hours as needed for wheezing or shortness of breath.          Objective:    BP 132/62   Pulse 97   Temp (!) 96.8 F (36 C)  (Oral)   Ht 5' 6"  (1.676 m)   Wt 207 lb 3.2 oz (94 kg)   BMI 33.44 kg/m   Allergies  Allergen Reactions  . Atorvastatin     All statins make him cough  . Statins Cough    Pt is currently taking pravastatin     Wt Readings from Last 3 Encounters:  07/20/17 207 lb 3.2 oz (94 kg)  05/24/17 197 lb (89.4 kg)  04/23/17 190  lb 6.4 oz (86.4 kg)    Physical Exam  Constitutional: He appears well-developed and well-nourished.  HENT:  Head: Normocephalic and atraumatic.  Right Ear: Hearing and tympanic membrane normal.  Left Ear: Hearing and tympanic membrane normal.  Nose: Mucosal edema and sinus tenderness present. No nasal deformity. Right sinus exhibits frontal sinus tenderness. Left sinus exhibits frontal sinus tenderness.  Mouth/Throat: Posterior oropharyngeal erythema present.  Eyes: Pupils are equal, round, and reactive to light. Conjunctivae and EOM are normal. Right eye exhibits no discharge. Left eye exhibits no discharge.  Neck: Normal range of motion. Neck supple.  Cardiovascular: Normal rate, regular rhythm and normal heart sounds.  2+ pitting edema  Pulmonary/Chest: Effort normal. No respiratory distress. He has no decreased breath sounds. He has wheezes. He has no rhonchi. He has no rales.  Abdominal: Soft. Bowel sounds are normal.  Musculoskeletal: Normal range of motion.  Skin: Skin is warm and dry.    Results for orders placed or performed in visit on 04/26/17  [facility]  Result Value Ref Range   Glucose 161 (H) 65 - 99 mg/dL   BUN 26 8 - 27 mg/dL   Creatinine, Ser 1.83 (H) 0.76 - 1.27 mg/dL   GFR calc non Af Amer 38 (L) >59 mL/min/1.73   GFR calc Af Amer 43 (L) >59 mL/min/1.73   BUN/Creatinine Ratio 14 10 - 24   Sodium 138 134 - 144 mmol/L   Potassium 4.5 3.5 - 5.2 mmol/L   Chloride 98 96 - 106 mmol/L   CO2 25 20 - 29 mmol/L   Calcium 9.8 8.6 - 10.2 mg/dL  Bayer DCA Hb A1c Waived  Result Value Ref Range   Bayer DCA Hb A1c Waived 6.1 <7.0 %  Microalbumin /  creatinine urine ratio  Result Value Ref Range   Creatinine, Urine 92.2 Not Estab. mg/dL   Microalbumin, Urine 162.0 Not Estab. ug/mL   Microalb/Creat Ratio 175.7 (H) 0.0 - 30.0 mg/g creat      Assessment & Plan:   1. Edema, unspecified type - Microalbumin / creatinine urine ratio; Future - CMP14+EGFR; Future  2. PVD (peripheral vascular disease) ([place])  3. CKD (chronic kidney disease) stage 2, GFR 60-89 ml/min - Microalbumin / creatinine urine ratio; Future - CMP14+EGFR; Future   Continue all other maintenance medications as listed above.  Follow up plan: No follow-ups on file.  Educational handout given for [facility] PA-C [facility] [address]  [place],  [address] [phone]   07/20/2017, 1:50 PM

## 2017-07-27 ENCOUNTER — Encounter: Payer: Self-pay | Admitting: Physician Assistant

## 2017-07-27 ENCOUNTER — Ambulatory Visit (INDEPENDENT_AMBULATORY_CARE_PROVIDER_SITE_OTHER): Payer: Medicare Other | Admitting: Physician Assistant

## 2017-07-27 VITALS — BP 132/75 | HR 104 | Temp 97.8°F | Ht 66.0 in | Wt 203.0 lb

## 2017-07-27 DIAGNOSIS — M5136 Other intervertebral disc degeneration, lumbar region: Secondary | ICD-10-CM

## 2017-07-27 DIAGNOSIS — R350 Frequency of micturition: Secondary | ICD-10-CM

## 2017-07-27 DIAGNOSIS — N182 Chronic kidney disease, stage 2 (mild): Secondary | ICD-10-CM | POA: Diagnosis not present

## 2017-07-27 DIAGNOSIS — I10 Essential (primary) hypertension: Secondary | ICD-10-CM

## 2017-07-27 DIAGNOSIS — R7989 Other specified abnormal findings of blood chemistry: Secondary | ICD-10-CM | POA: Diagnosis not present

## 2017-07-27 DIAGNOSIS — N401 Enlarged prostate with lower urinary tract symptoms: Secondary | ICD-10-CM

## 2017-07-27 DIAGNOSIS — E1122 Type 2 diabetes mellitus with diabetic chronic kidney disease: Secondary | ICD-10-CM

## 2017-07-27 LAB — BAYER DCA HB A1C WAIVED: HB A1C (BAYER DCA - WAIVED): 6.1 % (ref <7.0)

## 2017-07-27 MED ORDER — HYDROCODONE-ACETAMINOPHEN 10-325 MG PO TABS: 1.0000 | ORAL_TABLET | Freq: Three times a day (TID) | ORAL | 0 refills | Status: DC | PRN

## 2017-07-27 MED ORDER — HYDROCODONE-ACETAMINOPHEN 10-325 MG PO TABS: 1.0000 | ORAL_TABLET | Freq: Four times a day (QID) | ORAL | 0 refills | Status: DC | PRN

## 2017-07-28 LAB — MICROALBUMIN / CREATININE URINE RATIO
CREATININE, UR: 70.4 mg/dL
Microalb/Creat Ratio: 204.5 mg/g creat — ABNORMAL HIGH (ref 0.0–30.0)
Microalbumin, Urine: 144 ug/mL

## 2017-07-28 LAB — CMP14+EGFR
ALBUMIN: 3.8 g/dL (ref 3.6–4.8)
ALK PHOS: 96 IU/L (ref 39–117)
ALT: 10 IU/L (ref 0–44)
AST: 18 IU/L (ref 0–40)
Albumin/Globulin Ratio: 1.2 (ref 1.2–2.2)
BUN / CREAT RATIO: 11 (ref 10–24)
BUN: 20 mg/dL (ref 8–27)
Bilirubin Total: 0.4 mg/dL (ref 0.0–1.2)
CO2: 23 mmol/L (ref 20–29)
CREATININE: 1.78 mg/dL — AB (ref 0.76–1.27)
Calcium: 9.9 mg/dL (ref 8.6–10.2)
Chloride: 99 mmol/L (ref 96–106)
GFR calc non Af Amer: 39 mL/min/{1.73_m2} — ABNORMAL LOW (ref >59)
GFR, EST AFRICAN AMERICAN: 45 mL/min/{1.73_m2} — AB (ref >59)
GLUCOSE: 110 mg/dL — AB (ref 65–99)
Globulin, Total: 3.3 g/dL (ref 1.5–4.5)
Potassium: 4.1 mmol/L (ref 3.5–5.2)
Sodium: 139 mmol/L (ref 134–144)
TOTAL PROTEIN: 7.1 g/dL (ref 6.0–8.5)

## 2017-07-28 LAB — CBC WITH DIFFERENTIAL/PLATELET
BASOS ABS: 0.1 10*3/uL (ref 0.0–0.2)
Basos: 1 %
EOS (ABSOLUTE): 0.4 10*3/uL (ref 0.0–0.4)
Eos: 6 %
HEMOGLOBIN: 11.4 g/dL — AB (ref 13.0–17.7)
Hematocrit: 34.7 % — ABNORMAL LOW (ref 37.5–51.0)
Immature Grans (Abs): 0 10*3/uL (ref 0.0–0.1)
Immature Granulocytes: 0 %
Lymphocytes Absolute: 2.2 10*3/uL (ref 0.7–3.1)
Lymphs: 32 %
MCH: 28.3 pg (ref 26.6–33.0)
MCHC: 32.9 g/dL (ref 31.5–35.7)
MCV: 86 fL (ref 79–97)
MONOCYTES: 10 %
Monocytes Absolute: 0.7 10*3/uL (ref 0.1–0.9)
NEUTROS PCT: 51 %
Neutrophils Absolute: 3.4 10*3/uL (ref 1.4–7.0)
Platelets: 270 10*3/uL (ref 150–379)
RBC: 4.03 x10E6/uL — AB (ref 4.14–5.80)
RDW: 15.5 % — ABNORMAL HIGH (ref 12.3–15.4)
WBC: 6.8 10*3/uL (ref 3.4–10.8)

## 2017-07-28 LAB — LIPID PANEL
CHOLESTEROL TOTAL: 153 mg/dL (ref 100–199)
Chol/HDL Ratio: 6.1 ratio — ABNORMAL HIGH (ref 0.0–5.0)
HDL: 25 mg/dL — ABNORMAL LOW (ref >39)
LDL CALC: 92 mg/dL (ref 0–99)
Triglycerides: 180 mg/dL — ABNORMAL HIGH (ref 0–149)
VLDL CHOLESTEROL CAL: 36 mg/dL (ref 5–40)

## 2017-07-28 LAB — TSH: TSH: 1.73 u[IU]/mL (ref 0.450–4.500)

## 2017-07-28 LAB — PSA: PROSTATE SPECIFIC AG, SERUM: 1.4 ng/mL (ref 0.0–4.0)

## 2017-07-30 NOTE — Progress Notes (Signed)
BP 132/75   Pulse (!) 104   Temp 97.8 F (36.6 C) (Oral)   Ht 5' 6"  (1.676 m)   Wt 203 lb (92.1 kg)   BMI 32.77 kg/m    Subjective:    Patient ID: Tommy Douglas, male    DOB: 27-Jul-1950, 67 y.o.   MRN: [id number]  HPI: Tommy Douglas is a 67 y.o. male presenting on 07/27/2017 for Hypertension; Hyperlipidemia; and Diabetes  This patient comes in for a 60-monthrecheck on his diabetes, hyperlipidemia and hypertension.  He has recently had some edema.  He has been taking hydrochlorothiazide f he denies any shortness of breath or chest pain at this time.  We need to evaluate his kidneys with labs today.  He does have chronic kidney disease.  Or the past week and states that it is improved.  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  His pain medication will be refilled today we will recheck him in 3 months.   Past Medical History:  Diagnosis Date  . Anxiety   . Arthritis    "qwhere" (12/20/2016)  . Chronic lower back pain   . CKD (chronic kidney disease), stage IV ([facility]   . Complication of anesthesia    "got the wrong kind of anesthesia ~ 2000 when they were looking down into my stomach"  . Coronary artery disease   . Gout   . Heart disease   . Hyperlipidemia   . Hypertension   . Pneumonia ~ 2015  . Seasonal allergies   . Type II diabetes mellitus (HCC)    Relevant past medical, surgical, family and social history reviewed and updated as indicated. Interim medical history since our last visit reviewed. Allergies and medications reviewed and updated. DATA REVIEWED: CHART IN EPIC  Family History reviewed for pertinent findings.  Review of Systems  Constitutional: Negative.  Negative for appetite change and fatigue.  Eyes: Negative for pain and visual disturbance.  Respiratory: Negative.  Negative for cough, chest tightness, shortness of breath and wheezing.   Cardiovascular: Positive for leg swelling. Negative for chest pain and palpitations.    Gastrointestinal: Negative.  Negative for abdominal pain, diarrhea, nausea and vomiting.  Genitourinary: Negative.   Musculoskeletal: Positive for arthralgias, joint swelling and myalgias.  Skin: Negative.  Negative for color change and rash.  Neurological: Negative.  Negative for weakness, numbness and headaches.  Psychiatric/Behavioral: Negative.     Allergies as of 07/27/2017      Reactions   Atorvastatin    All statins make him cough   Statins Cough   Pt is currently taking pravastatin       Medication List        Accurate as of 07/27/17 11:59 PM. Always use your most recent med list.          acetaminophen 325 MG tablet Commonly known as:  TYLENOL Take 2 tablets (650 mg total) by mouth every 4 (four) hours as needed for headache or mild pain.   allopurinol 100 MG tablet Commonly known as:  ZYLOPRIM Take 1 tablet (100 mg total) 3 (three) times daily by mouth.   ALPRAZolam 0.5 MG tablet Commonly known as:  XANAX TAKE ONE TABLET TWICE DAILY AS NEEDED   aspirin EC 81 MG tablet Take 81 mg by mouth at bedtime.   carvedilol 25 MG tablet Commonly known as:  COREG Take 0.5 tablets (12.5 mg total) by mouth 2 (two) times daily.   cetirizine 10 MG tablet  Commonly known as:  ZYRTEC Take 1 tablet (10 mg total) daily as needed by mouth (for allergies.).   clopidogrel 75 MG tablet Commonly known as:  PLAVIX Take 1 tablet (75 mg total) at bedtime by mouth.   cyclobenzaprine 10 MG tablet Commonly known as:  FLEXERIL Take 1 tablet (10 mg total) 3 (three) times daily as needed by mouth for muscle spasms (typically scheduled at bedtime).   doxycycline 100 MG tablet Commonly known as:  VIBRA-TABS Take 1 tablet (100 mg total) by mouth 2 (two) times daily.   Febuxostat 80 MG Tabs Commonly known as:  ULORIC Take 1 tablet (80 mg total) at bedtime by mouth. TAKE ONE (1) TABLET EACH DAY   hydrochlorothiazide 25 MG tablet Commonly known as:  HYDRODIURIL Take 1 tablet (25 mg  total) by mouth daily.   HYDROcodone-acetaminophen 10-325 MG tablet Commonly known as:  NORCO Take 1 tablet by mouth every 8 (eight) hours as needed.   HYDROcodone-acetaminophen 10-325 MG tablet Commonly known as:  NORCO Take 1 tablet by mouth every 6 (six) hours as needed.   HYDROcodone-acetaminophen 10-325 MG tablet Commonly known as:  NORCO Take 1 tablet by mouth every 6 (six) hours as needed.   isosorbide mononitrate 30 MG 24 hr tablet Commonly known as:  IMDUR Take 1 tablet (30 mg total) by mouth daily.   liraglutide 18 MG/3ML Sopn Commonly known as:  VICTOZA Inject 0.3 mLs (1.8 mg total) daily into the skin. INJECT 0.3ML SQ DAILY   LYRICA 200 MG capsule Generic drug:  pregabalin Take 1 capsule (200 mg total) by mouth 2 (two) times daily as needed (for pain.).   nitroGLYCERIN 0.4 MG SL tablet Commonly known as:  NITROSTAT Place 1 tablet (0.4 mg total) under the tongue every 5 (five) minutes as needed for chest pain.   pantoprazole 40 MG tablet Commonly known as:  PROTONIX Take 1 tablet (40 mg total) by mouth daily at 6 (six) AM.   pioglitazone 45 MG tablet Commonly known as:  ACTOS Take 1 tablet (45 mg total) every evening by mouth. TAKE ONE (1) TABLET EACH DAY   pravastatin 40 MG tablet Commonly known as:  PRAVACHOL Take 1 tablet (40 mg total) every evening by mouth. TAKE ONE (1) TABLET EACH DAY   predniSONE 10 MG tablet Commonly known as:  DELTASONE Take 1-2 tablets (10-20 mg total) by mouth daily with breakfast.   ULTICARE MICRO PEN NEEDLES 32G X 4 MM Misc Generic drug:  Insulin Pen Needle EVERY DAY   VENTOLIN HFA 108 (90 Base) MCG/ACT inhaler Generic drug:  albuterol Inhale 2 puffs into the lungs every 6 (six) hours as needed for wheezing or shortness of breath.          Objective:    BP 132/75   Pulse (!) 104   Temp 97.8 F (36.6 C) (Oral)   Ht 5' 6"  (1.676 m)   Wt 203 lb (92.1 kg)   BMI 32.77 kg/m   Allergies  Allergen Reactions  .  Atorvastatin     All statins make him cough  . Statins Cough    Pt is currently taking pravastatin     Wt Readings from Last 3 Encounters:  07/27/17 203 lb (92.1 kg)  07/20/17 207 lb 3.2 oz (94 kg)  05/24/17 197 lb (89.4 kg)    Physical Exam  Constitutional: He appears well-developed and well-nourished. No distress.  HENT:  Head: Normocephalic and atraumatic.  Eyes: Pupils are equal, round, and reactive to  light. Conjunctivae and EOM are normal.  Cardiovascular: Normal rate, regular rhythm, normal heart sounds and intact distal pulses.  Pulmonary/Chest: Effort normal and breath sounds normal. No respiratory distress.  Musculoskeletal: He exhibits edema, tenderness and deformity.  Skin: Skin is warm and dry. No erythema.  Psychiatric: He has a normal mood and affect. His behavior is normal.  Nursing note and vitals reviewed.   Results for orders placed or performed in visit on 07/27/17  CBC with Differential/Platelet  Result Value Ref Range   WBC 6.8 3.4 - 10.8 x10E3/uL   RBC 4.03 (L) 4.14 - 5.80 x10E6/uL   Hemoglobin 11.4 (L) 13.0 - 17.7 g/dL   Hematocrit 34.7 (L) 37.5 - 51.0 %   MCV 86 79 - 97 fL   MCH 28.3 26.6 - 33.0 pg   MCHC 32.9 31.5 - 35.7 g/dL   RDW 15.5 (H) 12.3 - 15.4 %   Platelets 270 150 - 379 x10E3/uL   Neutrophils 51 Not Estab. %   Lymphs 32 Not Estab. %   Monocytes 10 Not Estab. %   Eos 6 Not Estab. %   Basos 1 Not Estab. %   Neutrophils Absolute 3.4 1.4 - 7.0 x10E3/uL   Lymphocytes Absolute 2.2 0.7 - 3.1 x10E3/uL   Monocytes Absolute 0.7 0.1 - 0.9 x10E3/uL   EOS (ABSOLUTE) 0.4 0.0 - 0.4 x10E3/uL   Basophils Absolute 0.1 0.0 - 0.2 x10E3/uL   Immature Granulocytes 0 Not Estab. %   Immature Grans (Abs) 0.0 0.0 - 0.1 x10E3/uL  CMP14+EGFR  Result Value Ref Range   Glucose 110 (H) 65 - 99 mg/dL   BUN 20 8 - 27 mg/dL   Creatinine, Ser 1.78 (H) 0.76 - 1.27 mg/dL   GFR calc non Af Amer 39 (L) >59 mL/min/1.73   GFR calc Af Amer 45 (L) >59 mL/min/1.73    BUN/Creatinine Ratio 11 10 - 24   Sodium 139 134 - 144 mmol/L   Potassium 4.1 3.5 - 5.2 mmol/L   Chloride 99 96 - 106 mmol/L   CO2 23 20 - 29 mmol/L   Calcium 9.9 8.6 - 10.2 mg/dL   Total Protein 7.1 6.0 - 8.5 g/dL   Albumin 3.8 3.6 - 4.8 g/dL   Globulin, Total 3.3 1.5 - 4.5 g/dL   Albumin/Globulin Ratio 1.2 1.2 - 2.2   Bilirubin Total 0.4 0.0 - 1.2 mg/dL   Alkaline Phosphatase 96 39 - 117 IU/L   AST 18 0 - 40 IU/L   ALT 10 0 - 44 IU/L  Lipid panel  Result Value Ref Range   Cholesterol, Total 153 100 - 199 mg/dL   Triglycerides 180 (H) 0 - 149 mg/dL   HDL 25 (L) >39 mg/dL   VLDL Cholesterol Cal 36 5 - 40 mg/dL   LDL Calculated 92 0 - 99 mg/dL   Chol/HDL Ratio 6.1 (H) 0.0 - 5.0 ratio  TSH  Result Value Ref Range   TSH 1.730 0.450 - 4.500 uIU/mL  PSA  Result Value Ref Range   Prostate Specific Ag, Serum 1.4 0.0 - 4.0 ng/mL  Bayer DCA Hb A1c Waived  Result Value Ref Range   Bayer DCA Hb A1c Waived 6.1 <7.0 %  Microalbumin / creatinine urine ratio  Result Value Ref Range   Creatinine, Urine 70.4 Not Estab. mg/dL   Microalbumin, Urine 144.0 Not Estab. ug/mL   Microalb/Creat Ratio 204.5 (H) 0.0 - 30.0 mg/g creat      Assessment & Plan:   1. DDD (degenerative  disc disease), lumbar - HYDROcodone-acetaminophen (NORCO) 10-325 MG tablet; Take 1 tablet by mouth every 8 (eight) hours as needed.  Dispense: 120 tablet; Refill: 0  2. Essential hypertension - CBC with Differential/Platelet - CMP14+EGFR - Lipid panel - TSH - PSA - Microalbumin / creatinine urine ratio  3. CKD (chronic kidney disease) stage 2, GFR 60-89 ml/min - CBC with Differential/Platelet - CMP14+EGFR - Lipid panel - TSH - PSA - Microalbumin / creatinine urine ratio  4. Elevated serum creatinine - CMP14+EGFR - Microalbumin / creatinine urine ratio  5. Type 2 diabetes mellitus with stage 2 chronic kidney disease, without long-term current use of insulin (HCC) - Lipid panel - Bayer DCA Hb A1c  Waived  6. Benign prostatic hyperplasia with urinary frequency - PSA   Continue all other maintenance medications as listed above.  Follow up plan: Return in about 3 months (around 10/27/2017) for recheck.  Educational handout given for [place] PA-C [place] [address]  [place], [place] [address] [phone]   07/30/2017, 9:21 AM

## 2017-08-14 ENCOUNTER — Encounter: Payer: Self-pay | Admitting: Physician Assistant

## 2017-08-14 ENCOUNTER — Ambulatory Visit (INDEPENDENT_AMBULATORY_CARE_PROVIDER_SITE_OTHER): Payer: Medicare Other | Admitting: Physician Assistant

## 2017-08-14 VITALS — BP 129/69 | HR 91 | Temp 97.0°F | Ht 66.0 in | Wt 205.2 lb

## 2017-08-14 DIAGNOSIS — I509 Heart failure, unspecified: Secondary | ICD-10-CM | POA: Diagnosis not present

## 2017-08-14 DIAGNOSIS — N183 Chronic kidney disease, stage 3 unspecified: Secondary | ICD-10-CM

## 2017-08-14 MED ORDER — BUMETANIDE 1 MG PO TABS: 1.0000 mg | ORAL_TABLET | Freq: Every day | ORAL | 2 refills | Status: DC

## 2017-08-14 NOTE — Progress Notes (Signed)
This patient comes in for an increased fluid in recent days.  He has had 3 episodes in recent months.  He denies any shortness of breath.  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  He is very tired.  He denies any chest pain.  He denies any fever or chills.    BP 129/69   Pulse 91   Temp (!) 97 F (36.1 C) (Oral)   Ht 5' 6"  (1.676 m)   Wt 205 lb 3.2 oz (93.1 kg)   BMI 33.12 kg/m    Subjective:    Patient ID: Tommy Douglas, male    DOB: 02-11-1951, 67 y.o.   MRN: [id number]  HPI: JOHNCARLO MAALOUF is a 67 y.o. male presenting on 08/14/2017 for Edema (legs )    Past Medical History:  Diagnosis Date  . Anxiety   . Arthritis    "qwhere" (12/20/2016)  . Chronic lower back pain   . CKD (chronic kidney disease), stage IV ([place])   . Complication of anesthesia    "got the wrong kind of anesthesia ~ 2000 when they were looking down into my stomach"  . Coronary artery disease   . Gout   . Heart disease   . Hyperlipidemia   . Hypertension   . Pneumonia ~ 2015  . Seasonal allergies   . Type II diabetes mellitus (HCC)    Relevant past medical, surgical, family and social history reviewed and updated as indicated. Interim medical history since our last visit reviewed. Allergies and medications reviewed and updated. DATA REVIEWED: CHART IN EPIC  Family History reviewed for pertinent findings.  Review of Systems  Constitutional: Positive for fatigue. Negative for appetite change.  Eyes: Negative for pain and visual disturbance.  Respiratory: Positive for shortness of breath. Negative for cough, chest tightness and wheezing.   Cardiovascular: Positive for leg swelling. Negative for chest pain and palpitations.  Gastrointestinal: Negative.  Negative for abdominal pain, diarrhea, nausea and vomiting.  Genitourinary: Negative.   Skin: Negative.  Negative for color change and rash.  Neurological: Negative.  Negative for weakness,  numbness and headaches.  Psychiatric/Behavioral: Negative.     Allergies as of 08/14/2017      Reactions   Atorvastatin    All statins make him cough   Statins Cough   Pt is currently taking pravastatin       Medication List        Accurate as of 08/14/17 11:59 PM. Always use your most recent med list.          acetaminophen 325 MG tablet Commonly known as:  TYLENOL Take 2 tablets (650 mg total) by mouth every 4 (four) hours as needed for headache or mild pain.   allopurinol 100 MG tablet Commonly known as:  ZYLOPRIM Take 1 tablet (100 mg total) 3 (three) times daily by mouth.   ALPRAZolam 0.5 MG tablet Commonly known as:  XANAX TAKE ONE TABLET TWICE DAILY AS NEEDED   aspirin EC 81 MG tablet Take 81 mg by mouth at bedtime.   bumetanide 1 MG tablet Commonly known as:  BUMEX Take 1 tablet (1 mg total) by mouth daily.   carvedilol 25 MG tablet Commonly known as:  COREG Take 0.5 tablets (12.5 mg total) by mouth 2 (two) times daily.   cetirizine 10 MG tablet Commonly known as:  ZYRTEC Take 1 tablet (10 mg total) daily as needed by mouth (for allergies.).  clopidogrel 75 MG tablet Commonly known as:  PLAVIX Take 1 tablet (75 mg total) at bedtime by mouth.   cyclobenzaprine 10 MG tablet Commonly known as:  FLEXERIL Take 1 tablet (10 mg total) 3 (three) times daily as needed by mouth for muscle spasms (typically scheduled at bedtime).   Febuxostat 80 MG Tabs Commonly known as:  ULORIC Take 1 tablet (80 mg total) at bedtime by mouth. TAKE ONE (1) TABLET EACH DAY   hydrochlorothiazide 25 MG tablet Commonly known as:  HYDRODIURIL Take 1 tablet (25 mg total) by mouth daily.   HYDROcodone-acetaminophen 10-325 MG tablet Commonly known as:  NORCO Take 1 tablet by mouth every 8 (eight) hours as needed.   HYDROcodone-acetaminophen 10-325 MG tablet Commonly known as:  NORCO Take 1 tablet by mouth every 6 (six) hours as needed.   HYDROcodone-acetaminophen 10-325 MG  tablet Commonly known as:  NORCO Take 1 tablet by mouth every 6 (six) hours as needed.   isosorbide mononitrate 30 MG 24 hr tablet Commonly known as:  IMDUR Take 1 tablet (30 mg total) by mouth daily.   liraglutide 18 MG/3ML Sopn Commonly known as:  VICTOZA Inject 0.3 mLs (1.8 mg total) daily into the skin. INJECT 0.3ML SQ DAILY   LYRICA 200 MG capsule Generic drug:  pregabalin Take 1 capsule (200 mg total) by mouth 2 (two) times daily as needed (for pain.).   nitroGLYCERIN 0.4 MG SL tablet Commonly known as:  NITROSTAT Place 1 tablet (0.4 mg total) under the tongue every 5 (five) minutes as needed for chest pain.   pantoprazole 40 MG tablet Commonly known as:  PROTONIX Take 1 tablet (40 mg total) by mouth daily at 6 (six) AM.   pioglitazone 45 MG tablet Commonly known as:  ACTOS Take 1 tablet (45 mg total) every evening by mouth. TAKE ONE (1) TABLET EACH DAY   pravastatin 40 MG tablet Commonly known as:  PRAVACHOL Take 1 tablet (40 mg total) every evening by mouth. TAKE ONE (1) TABLET EACH DAY   ULTICARE MICRO PEN NEEDLES 32G X 4 MM Misc Generic drug:  Insulin Pen Needle EVERY DAY   VENTOLIN HFA 108 (90 Base) MCG/ACT inhaler Generic drug:  albuterol Inhale 2 puffs into the lungs every 6 (six) hours as needed for wheezing or shortness of breath.          Objective:    BP 129/69   Pulse 91   Temp (!) 97 F (36.1 C) (Oral)   Ht 5' 6"  (1.676 m)   Wt 205 lb 3.2 oz (93.1 kg)   BMI 33.12 kg/m   Allergies  Allergen Reactions  . Atorvastatin     All statins make him cough  . Statins Cough    Pt is currently taking pravastatin     Wt Readings from Last 3 Encounters:  08/14/17 205 lb 3.2 oz (93.1 kg)  07/27/17 203 lb (92.1 kg)  07/20/17 207 lb 3.2 oz (94 kg)    Physical Exam  Constitutional: He appears well-developed and well-nourished. No distress.  HENT:  Head: Normocephalic and atraumatic.  Eyes: Pupils are equal, round, and reactive to light.  Conjunctivae and EOM are normal.  Cardiovascular: Normal rate, regular rhythm and normal heart sounds. Exam reveals no gallop and no friction rub.  No murmur heard. 3+ pitting edema in legs Abdomen tighter than normal  Pulmonary/Chest: Effort normal and breath sounds normal. No respiratory distress.  Skin: Skin is warm and dry.  Psychiatric: He has a normal  mood and affect. His behavior is normal.  Nursing note and vitals reviewed.   Results for orders placed or performed in visit on 07/27/17  CBC with Differential/Platelet  Result Value Ref Range   WBC 6.8 3.4 - 10.8 x10E3/uL   RBC 4.03 (L) 4.14 - 5.80 x10E6/uL   Hemoglobin 11.4 (L) 13.0 - 17.7 g/dL   Hematocrit 34.7 (L) 37.5 - 51.0 %   MCV 86 79 - 97 fL   MCH 28.3 26.6 - 33.0 pg   MCHC 32.9 31.5 - 35.7 g/dL   RDW 15.5 (H) 12.3 - 15.4 %   Platelets 270 150 - 379 x10E3/uL   Neutrophils 51 Not Estab. %   Lymphs 32 Not Estab. %   Monocytes 10 Not Estab. %   Eos 6 Not Estab. %   Basos 1 Not Estab. %   Neutrophils Absolute 3.4 1.4 - 7.0 x10E3/uL   Lymphocytes Absolute 2.2 0.7 - 3.1 x10E3/uL   Monocytes Absolute 0.7 0.1 - 0.9 x10E3/uL   EOS (ABSOLUTE) 0.4 0.0 - 0.4 x10E3/uL   Basophils Absolute 0.1 0.0 - 0.2 x10E3/uL   Immature Granulocytes 0 Not Estab. %   Immature Grans (Abs) 0.0 0.0 - 0.1 x10E3/uL  CMP14+EGFR  Result Value Ref Range   Glucose 110 (H) 65 - 99 mg/dL   BUN 20 8 - 27 mg/dL   Creatinine, Ser 1.78 (H) 0.76 - 1.27 mg/dL   GFR calc non Af Amer 39 (L) >59 mL/min/1.73   GFR calc Af Amer 45 (L) >59 mL/min/1.73   BUN/Creatinine Ratio 11 10 - 24   Sodium 139 134 - 144 mmol/L   Potassium 4.1 3.5 - 5.2 mmol/L   Chloride 99 96 - 106 mmol/L   CO2 23 20 - 29 mmol/L   Calcium 9.9 8.6 - 10.2 mg/dL   Total Protein 7.1 6.0 - 8.5 g/dL   Albumin 3.8 3.6 - 4.8 g/dL   Globulin, Total 3.3 1.5 - 4.5 g/dL   Albumin/Globulin Ratio 1.2 1.2 - 2.2   Bilirubin Total 0.4 0.0 - 1.2 mg/dL   Alkaline Phosphatase 96 39 - 117 IU/L   AST  18 0 - 40 IU/L   ALT 10 0 - 44 IU/L  Lipid panel  Result Value Ref Range   Cholesterol, Total 153 100 - 199 mg/dL   Triglycerides 180 (H) 0 - 149 mg/dL   HDL 25 (L) >39 mg/dL   VLDL Cholesterol Cal 36 5 - 40 mg/dL   LDL Calculated 92 0 - 99 mg/dL   Chol/HDL Ratio 6.1 (H) 0.0 - 5.0 ratio  TSH  Result Value Ref Range   TSH 1.730 0.450 - 4.500 uIU/mL  PSA  Result Value Ref Range   Prostate Specific Ag, Serum 1.4 0.0 - 4.0 ng/mL  Bayer DCA Hb A1c Waived  Result Value Ref Range   HB A1C (BAYER DCA - WAIVED) 6.1 <7.0 %  Microalbumin / creatinine urine ratio  Result Value Ref Range   Creatinine, Urine 70.4 Not Estab. mg/dL   Microalbumin, Urine 144.0 Not Estab. ug/mL   Microalb/Creat Ratio 204.5 (H) 0.0 - 30.0 mg/g creat      Assessment & Plan:   1. Congestive heart failure, unspecified HF chronicity, unspecified heart failure type ([facility]) - Brain natriuretic peptide - Ambulatory referral to Cardiology  2. Stage 3 chronic kidney disease ([place]) labs   Continue all other maintenance medications as listed above.  Follow up plan: Return in about 6 days (around 08/20/2017) for recheck.  Educational  handout given for [place] PA-C [place] [address]  [place], [place] [address] [phone]   08/15/2017, 12:24 PM

## 2017-08-14 NOTE — Patient Instructions (Signed)
In a few days you may receive a survey in the mail or online from [employer] regarding your visit with us today. Please take a moment to fill this out. Your feedback is very important to our whole office. It can help us better understand your needs as well as improve your experience and satisfaction. Thank you for taking your time to complete it. We care about you.  Francesa Eugenio, PA-C  

## 2017-08-15 LAB — BRAIN NATRIURETIC PEPTIDE: BNP: 23 pg/mL (ref 0.0–100.0)

## 2017-08-22 ENCOUNTER — Encounter: Payer: Self-pay | Admitting: Physician Assistant

## 2017-08-22 ENCOUNTER — Ambulatory Visit (INDEPENDENT_AMBULATORY_CARE_PROVIDER_SITE_OTHER): Payer: Medicare Other | Admitting: Physician Assistant

## 2017-08-22 VITALS — BP 121/68 | HR 96 | Temp 98.0°F | Ht 66.0 in | Wt 199.2 lb

## 2017-08-22 DIAGNOSIS — I509 Heart failure, unspecified: Secondary | ICD-10-CM

## 2017-08-22 DIAGNOSIS — N183 Chronic kidney disease, stage 3 unspecified: Secondary | ICD-10-CM

## 2017-08-23 LAB — CMP14+EGFR
A/G RATIO: 1.2 (ref 1.2–2.2)
ALK PHOS: 113 IU/L (ref 39–117)
ALT: 10 IU/L (ref 0–44)
AST: 17 IU/L (ref 0–40)
Albumin: 4.1 g/dL (ref 3.6–4.8)
BILIRUBIN TOTAL: 0.4 mg/dL (ref 0.0–1.2)
BUN/Creatinine Ratio: 12 (ref 10–24)
BUN: 33 mg/dL — ABNORMAL HIGH (ref 8–27)
CHLORIDE: 97 mmol/L (ref 96–106)
CO2: 25 mmol/L (ref 20–29)
Calcium: 9.9 mg/dL (ref 8.6–10.2)
Creatinine, Ser: 2.69 mg/dL — ABNORMAL HIGH (ref 0.76–1.27)
GFR calc Af Amer: 27 mL/min/{1.73_m2} — ABNORMAL LOW (ref >59)
GFR calc non Af Amer: 24 mL/min/{1.73_m2} — ABNORMAL LOW (ref >59)
GLOBULIN, TOTAL: 3.4 g/dL (ref 1.5–4.5)
Glucose: 155 mg/dL — ABNORMAL HIGH (ref 65–99)
POTASSIUM: 4.8 mmol/L (ref 3.5–5.2)
Sodium: 138 mmol/L (ref 134–144)
Total Protein: 7.5 g/dL (ref 6.0–8.5)

## 2017-08-23 LAB — MICROALBUMIN / CREATININE URINE RATIO
Creatinine, Urine: 123.7 mg/dL
MICROALB/CREAT RATIO: 41.6 mg/g{creat} — AB (ref 0.0–30.0)
MICROALBUM., U, RANDOM: 51.5 ug/mL

## 2017-08-26 DIAGNOSIS — N183 Chronic kidney disease, stage 3 unspecified: Secondary | ICD-10-CM | POA: Insufficient documentation

## 2017-08-26 DIAGNOSIS — I509 Heart failure, unspecified: Secondary | ICD-10-CM | POA: Insufficient documentation

## 2017-08-26 DIAGNOSIS — N1831 Chronic kidney disease, stage 3a: Secondary | ICD-10-CM | POA: Insufficient documentation

## 2017-08-26 NOTE — Progress Notes (Signed)
BP 121/68   Pulse 96   Temp 98 F (36.7 C) (Oral)   Ht 5' 6"  (1.676 m)   Wt 199 lb 3.2 oz (90.4 kg)   BMI 32.15 kg/m    Subjective:    Patient ID: Tommy Douglas, male    DOB: Jun 14, 1950, 67 y.o.   MRN: [id number]  HPI: Tommy Douglas is a 67 y.o. male presenting on 08/22/2017 for edema in legs This patient comes in for recheck on his congestive heart failure and kidney disease.  His edema in his legs is slightly improved.  He is down several more pounds.  He denies any new symptoms.  He still is quite fatigued.  He denies any chest pain at this time.  He will be having a cardiology appointment very soon.    Past Medical History:  Diagnosis Date  . Anxiety   . Arthritis    "qwhere" (12/20/2016)  . Chronic lower back pain   . CKD (chronic kidney disease), stage IV ([place])   . Complication of anesthesia    "got the wrong kind of anesthesia ~ 2000 when they were looking down into my stomach"  . Coronary artery disease   . Gout   . Heart disease   . Hyperlipidemia   . Hypertension   . Pneumonia ~ 2015  . Seasonal allergies   . Type II diabetes mellitus (HCC)    Relevant past medical, surgical, family and social history reviewed and updated as indicated. Interim medical history since our last visit reviewed. Allergies and medications reviewed and updated. DATA REVIEWED: CHART IN EPIC  Family History reviewed for pertinent findings.  Review of Systems  Constitutional: Positive for fatigue. Negative for appetite change.  HENT: Negative.   Eyes: Negative.  Negative for pain and visual disturbance.  Respiratory: Negative.  Negative for cough, chest tightness, shortness of breath and wheezing.   Cardiovascular: Positive for leg swelling. Negative for chest pain and palpitations.  Gastrointestinal: Negative.  Negative for abdominal pain, diarrhea, nausea and vomiting.  Endocrine: Negative.   Genitourinary: Negative.   Musculoskeletal: Negative.   Skin: Negative.   Negative for color change and rash.  Neurological: Negative.  Negative for weakness, numbness and headaches.  Psychiatric/Behavioral: Negative.     Allergies as of 08/22/2017      Reactions   Atorvastatin    All statins make him cough   Statins Cough   Pt is currently taking pravastatin       Medication List        Accurate as of 08/22/17 11:59 PM. Always use your most recent med list.          acetaminophen 325 MG tablet Commonly known as:  TYLENOL Take 2 tablets (650 mg total) by mouth every 4 (four) hours as needed for headache or mild pain.   allopurinol 100 MG tablet Commonly known as:  ZYLOPRIM Take 1 tablet (100 mg total) 3 (three) times daily by mouth.   ALPRAZolam 0.5 MG tablet Commonly known as:  XANAX TAKE ONE TABLET TWICE DAILY AS NEEDED   aspirin EC 81 MG tablet Take 81 mg by mouth at bedtime.   bumetanide 1 MG tablet Commonly known as:  BUMEX Take 1 tablet (1 mg total) by mouth daily.   carvedilol 25 MG tablet Commonly known as:  COREG Take 0.5 tablets (12.5 mg total) by mouth 2 (two) times daily.   cetirizine 10 MG tablet Commonly known as:  ZYRTEC Take 1 tablet (10  mg total) daily as needed by mouth (for allergies.).   clopidogrel 75 MG tablet Commonly known as:  PLAVIX Take 1 tablet (75 mg total) at bedtime by mouth.   cyclobenzaprine 10 MG tablet Commonly known as:  FLEXERIL Take 1 tablet (10 mg total) 3 (three) times daily as needed by mouth for muscle spasms (typically scheduled at bedtime).   Febuxostat 80 MG Tabs Commonly known as:  ULORIC Take 1 tablet (80 mg total) at bedtime by mouth. TAKE ONE (1) TABLET EACH DAY   hydrochlorothiazide 25 MG tablet Commonly known as:  HYDRODIURIL Take 1 tablet (25 mg total) by mouth daily.   HYDROcodone-acetaminophen 10-325 MG tablet Commonly known as:  NORCO Take 1 tablet by mouth every 8 (eight) hours as needed.   HYDROcodone-acetaminophen 10-325 MG tablet Commonly known as:  NORCO Take 1  tablet by mouth every 6 (six) hours as needed.   HYDROcodone-acetaminophen 10-325 MG tablet Commonly known as:  NORCO Take 1 tablet by mouth every 6 (six) hours as needed.   isosorbide mononitrate 30 MG 24 hr tablet Commonly known as:  IMDUR Take 1 tablet (30 mg total) by mouth daily.   liraglutide 18 MG/3ML Sopn Commonly known as:  VICTOZA Inject 0.3 mLs (1.8 mg total) daily into the skin. INJECT 0.3ML SQ DAILY   LYRICA 200 MG capsule Generic drug:  pregabalin Take 1 capsule (200 mg total) by mouth 2 (two) times daily as needed (for pain.).   nitroGLYCERIN 0.4 MG SL tablet Commonly known as:  NITROSTAT Place 1 tablet (0.4 mg total) under the tongue every 5 (five) minutes as needed for chest pain.   pantoprazole 40 MG tablet Commonly known as:  PROTONIX Take 1 tablet (40 mg total) by mouth daily at 6 (six) AM.   pioglitazone 45 MG tablet Commonly known as:  ACTOS Take 1 tablet (45 mg total) every evening by mouth. TAKE ONE (1) TABLET EACH DAY   pravastatin 40 MG tablet Commonly known as:  PRAVACHOL Take 1 tablet (40 mg total) every evening by mouth. TAKE ONE (1) TABLET EACH DAY   ULTICARE MICRO PEN NEEDLES 32G X 4 MM Misc Generic drug:  Insulin Pen Needle EVERY DAY   VENTOLIN HFA 108 (90 Base) MCG/ACT inhaler Generic drug:  albuterol Inhale 2 puffs into the lungs every 6 (six) hours as needed for wheezing or shortness of breath.          Objective:    BP 121/68   Pulse 96   Temp 98 F (36.7 C) (Oral)   Ht 5' 6"  (1.676 m)   Wt 199 lb 3.2 oz (90.4 kg)   BMI 32.15 kg/m   Allergies  Allergen Reactions  . Atorvastatin     All statins make him cough  . Statins Cough    Pt is currently taking pravastatin     Wt Readings from Last 3 Encounters:  08/22/17 199 lb 3.2 oz (90.4 kg)  08/14/17 205 lb 3.2 oz (93.1 kg)  07/27/17 203 lb (92.1 kg)    Physical Exam  Constitutional: He appears well-developed and well-nourished.  HENT:  Head: Normocephalic and  atraumatic.  Eyes: Pupils are equal, round, and reactive to light. Conjunctivae and EOM are normal.  Neck: Normal range of motion. Neck supple.  Cardiovascular: Normal rate, regular rhythm and normal heart sounds.  Pulmonary/Chest: Effort normal and breath sounds normal.  Abdominal: Soft. Bowel sounds are normal. He exhibits distension.  Musculoskeletal: Normal range of motion.  Skin: Skin is warm  and dry.    Results for orders placed or performed in visit on 08/22/17  CMP14+EGFR  Result Value Ref Range   Glucose 155 (H) 65 - 99 mg/dL   BUN 33 (H) 8 - 27 mg/dL   Creatinine, Ser 2.69 (H) 0.76 - 1.27 mg/dL   GFR calc non Af Amer 24 (L) >59 mL/min/1.73   GFR calc Af Amer 27 (L) >59 mL/min/1.73   BUN/Creatinine Ratio 12 10 - 24   Sodium 138 134 - 144 mmol/L   Potassium 4.8 3.5 - 5.2 mmol/L   Chloride 97 96 - 106 mmol/L   CO2 25 20 - 29 mmol/L   Calcium 9.9 8.6 - 10.2 mg/dL   Total Protein 7.5 6.0 - 8.5 g/dL   Albumin 4.1 3.6 - 4.8 g/dL   Globulin, Total 3.4 1.5 - 4.5 g/dL   Albumin/Globulin Ratio 1.2 1.2 - 2.2   Bilirubin Total 0.4 0.0 - 1.2 mg/dL   Alkaline Phosphatase 113 39 - 117 IU/L   AST 17 0 - 40 IU/L   ALT 10 0 - 44 IU/L  Microalbumin/Creatinine Ratio, Urine  Result Value Ref Range   Creatinine, Urine 123.7 Not Estab. mg/dL   Microalbumin, Urine 51.5 Not Estab. ug/mL   Microalb/Creat Ratio 41.6 (H) 0.0 - 30.0 mg/g creat      Assessment & Plan:   1. Congestive heart failure, unspecified HF chronicity, unspecified heart failure type (HCC) - CMP14+EGFR - Microalbumin/Creatinine Ratio, Urine  2. Stage 3 chronic kidney disease (HCC) - CMP14+EGFR - Microalbumin/Creatinine Ratio, Urine   Continue all other maintenance medications as listed above.  Follow up plan: No follow-ups on file.  Educational handout given for Heard PA-C [place] [address]  [place], [place] [address] [phone]   08/26/2017, 9:03 PM

## 2017-08-27 ENCOUNTER — Other Ambulatory Visit: Payer: Self-pay | Admitting: Physician Assistant

## 2017-08-27 DIAGNOSIS — N182 Chronic kidney disease, stage 2 (mild): Principal | ICD-10-CM

## 2017-08-27 DIAGNOSIS — E1122 Type 2 diabetes mellitus with diabetic chronic kidney disease: Secondary | ICD-10-CM

## 2017-08-28 ENCOUNTER — Telehealth: Payer: Self-pay | Admitting: Physician Assistant

## 2017-08-28 ENCOUNTER — Other Ambulatory Visit: Payer: Self-pay | Admitting: Physician Assistant

## 2017-08-28 ENCOUNTER — Other Ambulatory Visit: Payer: Medicare Other

## 2017-08-28 DIAGNOSIS — N182 Chronic kidney disease, stage 2 (mild): Principal | ICD-10-CM

## 2017-08-28 DIAGNOSIS — N183 Chronic kidney disease, stage 3 unspecified: Secondary | ICD-10-CM

## 2017-08-28 DIAGNOSIS — E1122 Type 2 diabetes mellitus with diabetic chronic kidney disease: Secondary | ICD-10-CM

## 2017-08-28 MED ORDER — LIRAGLUTIDE 18 MG/3ML ~~LOC~~ SOPN: 1.8000 mg | PEN_INJECTOR | Freq: Every day | SUBCUTANEOUS | 13 refills | Status: DC

## 2017-08-28 MED ORDER — LIRAGLUTIDE 18 MG/3ML ~~LOC~~ SOPN: 1.8000 mg | PEN_INJECTOR | Freq: Every day | SUBCUTANEOUS | 11 refills | Status: DC

## 2017-08-28 NOTE — Telephone Encounter (Signed)
Pharmacy aware and he states that this should work.

## 2017-08-28 NOTE — Telephone Encounter (Signed)
[facility] called and would like you to send over the Victoza prescription again.  He said the quantity should be 27 ml which he said will be 3 boxes, and also the [facility] code is for the 0.2 ml.  He said if you see the 13 instead of the 12 change that too.  He said if you cannot, that is fine, just change the quantity.

## 2017-08-28 NOTE — Telephone Encounter (Signed)
Your I sent a new prescription.  Can you check with Aaron Edelman and see if this is the correct thing

## 2017-08-29 ENCOUNTER — Other Ambulatory Visit: Payer: Self-pay | Admitting: Physician Assistant

## 2017-08-29 LAB — CMP14+EGFR
A/G RATIO: 1.4 (ref 1.2–2.2)
ALBUMIN: 4.1 g/dL (ref 3.6–4.8)
ALT: 9 IU/L (ref 0–44)
AST: 17 IU/L (ref 0–40)
Alkaline Phosphatase: 93 IU/L (ref 39–117)
BILIRUBIN TOTAL: 0.3 mg/dL (ref 0.0–1.2)
BUN / CREAT RATIO: 10 (ref 10–24)
BUN: 24 mg/dL (ref 8–27)
CHLORIDE: 98 mmol/L (ref 96–106)
CO2: 25 mmol/L (ref 20–29)
Calcium: 10 mg/dL (ref 8.6–10.2)
Creatinine, Ser: 2.3 mg/dL — ABNORMAL HIGH (ref 0.76–1.27)
GFR calc non Af Amer: 29 mL/min/{1.73_m2} — ABNORMAL LOW (ref >59)
GFR, EST AFRICAN AMERICAN: 33 mL/min/{1.73_m2} — AB (ref >59)
Globulin, Total: 3 g/dL (ref 1.5–4.5)
Glucose: 116 mg/dL — ABNORMAL HIGH (ref 65–99)
POTASSIUM: 4.6 mmol/L (ref 3.5–5.2)
Sodium: 138 mmol/L (ref 134–144)
TOTAL PROTEIN: 7.1 g/dL (ref 6.0–8.5)

## 2017-08-29 MED ORDER — BUMETANIDE 0.5 MG PO TABS: 0.5000 mg | ORAL_TABLET | Freq: Every day | ORAL | 0 refills | Status: DC

## 2017-09-24 ENCOUNTER — Encounter: Payer: Self-pay | Admitting: Family Medicine

## 2017-09-24 ENCOUNTER — Ambulatory Visit (INDEPENDENT_AMBULATORY_CARE_PROVIDER_SITE_OTHER): Payer: Medicare Other | Admitting: Family Medicine

## 2017-09-24 ENCOUNTER — Ambulatory Visit (INDEPENDENT_AMBULATORY_CARE_PROVIDER_SITE_OTHER): Payer: Medicare Other

## 2017-09-24 ENCOUNTER — Telehealth: Payer: Self-pay

## 2017-09-24 VITALS — BP 134/69 | HR 112 | Temp 97.3°F | Ht 66.0 in | Wt 207.1 lb

## 2017-09-24 DIAGNOSIS — N182 Chronic kidney disease, stage 2 (mild): Secondary | ICD-10-CM

## 2017-09-24 DIAGNOSIS — R600 Localized edema: Secondary | ICD-10-CM

## 2017-09-24 DIAGNOSIS — I2510 Atherosclerotic heart disease of native coronary artery without angina pectoris: Secondary | ICD-10-CM | POA: Diagnosis not present

## 2017-09-24 DIAGNOSIS — I509 Heart failure, unspecified: Secondary | ICD-10-CM | POA: Diagnosis not present

## 2017-09-24 DIAGNOSIS — Z9861 Coronary angioplasty status: Secondary | ICD-10-CM

## 2017-09-24 DIAGNOSIS — E1122 Type 2 diabetes mellitus with diabetic chronic kidney disease: Secondary | ICD-10-CM

## 2017-09-24 DIAGNOSIS — J811 Chronic pulmonary edema: Secondary | ICD-10-CM | POA: Diagnosis not present

## 2017-09-24 MED ORDER — BUMETANIDE 0.5 MG PO TABS: 1.0000 mg | ORAL_TABLET | Freq: Two times a day (BID) | ORAL | 0 refills | Status: DC

## 2017-09-24 NOTE — Telephone Encounter (Signed)
I am full, isn't there an urgent provider or another provider in this building with an appointment?

## 2017-09-24 NOTE — Progress Notes (Signed)
Subjective:  Patient ID: Tommy Douglas, male    DOB: December 14, 1950  Age: 67 y.o. MRN: [id number]  CC: Leg Swelling   HPI Tommy Douglas presents for edema in both legs increasing over the last couple of months.  Is been associated with shortness of breath.  He relates that if he walks as far as from his car to the office and estimated 100 - 150 feet at most.  He gets winded and has to rest.  This is a significant decline from his baseline.  He also feels fatigued.  He is noted to have a history of CHF in his past with most recent evaluation approximately 1 month ago.  Echocardiogram performed September 2018 showed a left ventricular ejection fraction of 55 to 60% with normal wall motion.  At that time he had coronary stent placed at the time of left heart catheterization and coronary angiography.  Those reports were reviewed today.  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  Depression screen [facility] 2/9 08/22/2017 08/14/2017 07/27/2017  Decreased Interest 0 0 0  Down, Depressed, Hopeless 0 0 0  PHQ - 2 Score 0 0 0    History Breylan has a past medical history of Anxiety, Arthritis, Chronic lower back pain, CKD (chronic kidney disease), stage IV ([place]), Complication of anesthesia, Coronary artery disease, Gout, Heart disease, Hyperlipidemia, Hypertension, Pneumonia (~ 2015), Seasonal allergies, and Type II diabetes mellitus ([place]).   He has a past surgical history that includes Laparoscopic cholecystectomy; Esophagogastroduodenoscopy (~ 2000); Coronary angioplasty with stent (2004 X 2); Cardiac catheterization (12/20/2016); LEFT HEART CATH AND CORONARY ANGIOGRAPHY (N/A, 12/20/2016); and CORONARY STENT INTERVENTION (N/A, 12/21/2016).   His family history includes Alcohol abuse in his brother; Cancer in his daughter and mother; Diabetes in his mother; Other in  his daughter, daughter, sister, sister, sister, and son; Stroke in his brother and father.He reports that he has been smoking cigarettes.  He has a 22.00 pack-year smoking history. He has never used smokeless tobacco. He reports that he does not drink alcohol or use drugs.    ROS Review of Systems  Constitutional: Positive for activity change and fatigue. Negative for appetite change, chills, diaphoresis and fever.  HENT: Negative.   Eyes: Negative for visual disturbance.  Respiratory: Positive for shortness of breath. Negative for cough.   Cardiovascular: Positive for leg swelling. Negative for chest pain.  Gastrointestinal: Negative for abdominal pain, diarrhea, nausea and vomiting.  Genitourinary: Negative for difficulty urinating.  Musculoskeletal: Negative for arthralgias and myalgias.  Skin: Negative for rash.  Neurological: Negative for headaches.  Psychiatric/Behavioral: Negative for sleep disturbance.    Objective:  BP 134/69   Pulse (!) 112   Temp (!) 97.3 F (36.3 C) (Oral)   Ht 5' 6"  (1.676 m)   Wt 207 lb 2 oz (94 kg)   BMI 33.43 kg/m   BP Readings from Last 3 Encounters:  09/24/17 134/69  08/22/17 121/68  08/14/17 129/69    Wt Readings from Last 3 Encounters:  09/24/17 207 lb 2 oz (94 kg)  08/22/17 199 lb 3.2 oz (90.4 kg)  08/14/17 205 lb 3.2 oz (93.1 kg)     Physical Exam  Constitutional: He appears well-developed and well-nourished.  HENT:  Head: Normocephalic and atraumatic.  Right Ear: Tympanic membrane and external ear normal. No decreased  hearing is noted.  Left Ear: Tympanic membrane and external ear normal. No decreased hearing is noted.  Mouth/Throat: No oropharyngeal exudate or posterior oropharyngeal erythema.  Eyes: Pupils are equal, round, and reactive to light.  Neck: Normal range of motion. Neck supple.  Cardiovascular: Normal rate and regular rhythm.  No murmur heard. Pulmonary/Chest: Breath sounds normal. No respiratory distress.    Abdominal: Soft. Bowel sounds are normal. He exhibits no mass. There is no tenderness.  Musculoskeletal: He exhibits edema (This is a 2+ reaching to the upper one third of the calves).  Vitals reviewed.   Chest x-ray shows cardiomegaly and hyperexpansion of the lungs. Assessment & Plan:   Travonte was seen today for leg swelling.  Diagnoses and all orders for this visit:  Congestive heart failure, unspecified HF chronicity, unspecified heart failure type ([place])  Bilateral lower extremity edema -     DG Chest 2 View; Future -     CBC with Differential/Platelet -     CMP14+EGFR -     Brain natriuretic peptide  Type 2 diabetes mellitus with stage 2 chronic kidney disease, without long-term current use of insulin (HCC)  CKD (chronic kidney disease) stage 2, GFR 60-89 ml/min  CAD S/P percutaneous coronary angioplasty  Other orders -     bumetanide (BUMEX) 0.5 MG tablet; Take 2 tablets (1 mg total) by mouth 2 (two) times daily for 7 days.       I have discontinued [place] pioglitazone. I have also changed his bumetanide. Additionally, I am having him maintain his aspirin EC, nitroGLYCERIN, acetaminophen, LYRICA, pantoprazole, isosorbide mononitrate, pravastatin, Febuxostat, cyclobenzaprine, clopidogrel, cetirizine, allopurinol, carvedilol, ALPRAZolam, ULTICARE MICRO PEN NEEDLES, hydrochlorothiazide, VENTOLIN HFA, HYDROcodone-acetaminophen, HYDROcodone-acetaminophen, HYDROcodone-acetaminophen, and liraglutide.  Allergies as of 09/24/2017      Reactions   Atorvastatin    All statins make him cough   Statins Cough   Pt is currently taking pravastatin       Medication List        Accurate as of 09/24/17  7:27 PM. Always use your most recent med list.          acetaminophen 325 MG tablet Commonly known as:  TYLENOL Take 2 tablets (650 mg total) by mouth every 4 (four) hours as needed for headache or mild pain.   allopurinol 100 MG tablet Commonly known as:   ZYLOPRIM Take 1 tablet (100 mg total) 3 (three) times daily by mouth.   ALPRAZolam 0.5 MG tablet Commonly known as:  XANAX TAKE ONE TABLET TWICE DAILY AS NEEDED   aspirin EC 81 MG tablet Take 81 mg by mouth at bedtime.   bumetanide 0.5 MG tablet Commonly known as:  BUMEX Take 2 tablets (1 mg total) by mouth 2 (two) times daily for 7 days.   carvedilol 25 MG tablet Commonly known as:  COREG Take 0.5 tablets (12.5 mg total) by mouth 2 (two) times daily.   cetirizine 10 MG tablet Commonly known as:  ZYRTEC Take 1 tablet (10 mg total) daily as needed by mouth (for allergies.).   clopidogrel 75 MG tablet Commonly known as:  PLAVIX Take 1 tablet (75 mg total) at bedtime by mouth.   cyclobenzaprine 10 MG tablet Commonly known as:  FLEXERIL Take 1 tablet (10 mg total) 3 (three) times daily as needed by mouth for muscle spasms (typically scheduled at bedtime).   Febuxostat 80 MG Tabs Commonly known as:  ULORIC Take 1 tablet (80 mg total) at bedtime by mouth. TAKE ONE (  1) TABLET EACH DAY   hydrochlorothiazide 25 MG tablet Commonly known as:  HYDRODIURIL Take 1 tablet (25 mg total) by mouth daily.   HYDROcodone-acetaminophen 10-325 MG tablet Commonly known as:  NORCO Take 1 tablet by mouth every 8 (eight) hours as needed.   HYDROcodone-acetaminophen 10-325 MG tablet Commonly known as:  NORCO Take 1 tablet by mouth every 6 (six) hours as needed.   HYDROcodone-acetaminophen 10-325 MG tablet Commonly known as:  NORCO Take 1 tablet by mouth every 6 (six) hours as needed.   isosorbide mononitrate 30 MG 24 hr tablet Commonly known as:  IMDUR Take 1 tablet (30 mg total) by mouth daily.   liraglutide 18 MG/3ML Sopn Commonly known as:  VICTOZA Inject 0.3 mLs (1.8 mg total) into the skin daily.   LYRICA 200 MG capsule Generic drug:  pregabalin Take 1 capsule (200 mg total) by mouth 2 (two) times daily as needed (for pain.).   nitroGLYCERIN 0.4 MG SL tablet Commonly known as:   NITROSTAT Place 1 tablet (0.4 mg total) under the tongue every 5 (five) minutes as needed for chest pain.   pantoprazole 40 MG tablet Commonly known as:  PROTONIX Take 1 tablet (40 mg total) by mouth daily at 6 (six) AM.   pravastatin 40 MG tablet Commonly known as:  PRAVACHOL Take 1 tablet (40 mg total) every evening by mouth. TAKE ONE (1) TABLET EACH DAY   ULTICARE MICRO PEN NEEDLES 32G X 4 MM Misc Generic drug:  Insulin Pen Needle EVERY DAY   VENTOLIN HFA 108 (90 Base) MCG/ACT inhaler Generic drug:  albuterol Inhale 2 puffs into the lungs every 6 (six) hours as needed for wheezing or shortness of breath.      Actos was content discontinued today due to potential side effect of contributing the swelling and exacerbating CHF.  Follow-up: Return in about 1 week (around 10/01/2017) for diabetes, CHF. with Particia Nearing, PCP  Claretta Fraise, M.D.

## 2017-09-24 NOTE — Telephone Encounter (Signed)
Spoke with Deanna and asked if patient needed an appt here at our office or a referral. She states that his legs are swelling much worse and he has been coming here for quite some time with no improvement. She stated that she would call patient and have him contact our office about an appt here but possibly with another provider.

## 2017-09-24 NOTE — Telephone Encounter (Signed)
Needs an urgent appt for cellulitis in legs   They are about to pop

## 2017-09-25 LAB — CMP14+EGFR
ALBUMIN: 3.9 g/dL (ref 3.6–4.8)
ALK PHOS: 94 IU/L (ref 39–117)
ALT: 11 IU/L (ref 0–44)
AST: 21 IU/L (ref 0–40)
Albumin/Globulin Ratio: 1.2 (ref 1.2–2.2)
BILIRUBIN TOTAL: 0.4 mg/dL (ref 0.0–1.2)
BUN / CREAT RATIO: 7 — AB (ref 10–24)
BUN: 15 mg/dL (ref 8–27)
CO2: 25 mmol/L (ref 20–29)
CREATININE: 2.12 mg/dL — AB (ref 0.76–1.27)
Calcium: 9.6 mg/dL (ref 8.6–10.2)
Chloride: 106 mmol/L (ref 96–106)
GFR calc non Af Amer: 31 mL/min/{1.73_m2} — ABNORMAL LOW (ref >59)
GFR, EST AFRICAN AMERICAN: 36 mL/min/{1.73_m2} — AB (ref >59)
GLOBULIN, TOTAL: 3.2 g/dL (ref 1.5–4.5)
GLUCOSE: 101 mg/dL — AB (ref 65–99)
Potassium: 4.5 mmol/L (ref 3.5–5.2)
Sodium: 144 mmol/L (ref 134–144)
TOTAL PROTEIN: 7.1 g/dL (ref 6.0–8.5)

## 2017-09-25 LAB — CBC WITH DIFFERENTIAL/PLATELET
Basophils Absolute: 0.1 10*3/uL (ref 0.0–0.2)
Basos: 1 %
EOS (ABSOLUTE): 0.3 10*3/uL (ref 0.0–0.4)
EOS: 5 %
HEMATOCRIT: 38.1 % (ref 37.5–51.0)
HEMOGLOBIN: 12.4 g/dL — AB (ref 13.0–17.7)
IMMATURE GRANS (ABS): 0 10*3/uL (ref 0.0–0.1)
Immature Granulocytes: 0 %
LYMPHS ABS: 1.6 10*3/uL (ref 0.7–3.1)
LYMPHS: 26 %
MCH: 28.7 pg (ref 26.6–33.0)
MCHC: 32.5 g/dL (ref 31.5–35.7)
MCV: 88 fL (ref 79–97)
MONOCYTES: 8 %
Monocytes Absolute: 0.5 10*3/uL (ref 0.1–0.9)
NEUTROS ABS: 3.7 10*3/uL (ref 1.4–7.0)
Neutrophils: 60 %
Platelets: 265 10*3/uL (ref 150–450)
RBC: 4.32 x10E6/uL (ref 4.14–5.80)
RDW: 15.7 % — ABNORMAL HIGH (ref 12.3–15.4)
WBC: 6.2 10*3/uL (ref 3.4–10.8)

## 2017-09-25 LAB — BRAIN NATRIURETIC PEPTIDE: BNP: 37.8 pg/mL (ref 0.0–100.0)

## 2017-09-26 ENCOUNTER — Ambulatory Visit: Payer: Medicare Other | Admitting: Cardiology

## 2017-10-01 ENCOUNTER — Ambulatory Visit (INDEPENDENT_AMBULATORY_CARE_PROVIDER_SITE_OTHER): Payer: Medicare Other | Admitting: Physician Assistant

## 2017-10-01 ENCOUNTER — Encounter: Payer: Self-pay | Admitting: Physician Assistant

## 2017-10-01 VITALS — BP 129/73 | HR 105 | Temp 97.7°F | Ht 66.0 in | Wt 206.8 lb

## 2017-10-01 DIAGNOSIS — N182 Chronic kidney disease, stage 2 (mild): Secondary | ICD-10-CM | POA: Diagnosis not present

## 2017-10-01 DIAGNOSIS — I509 Heart failure, unspecified: Secondary | ICD-10-CM | POA: Diagnosis not present

## 2017-10-01 MED ORDER — BUMETANIDE 0.5 MG PO TABS: 1.0000 mg | ORAL_TABLET | Freq: Two times a day (BID) | ORAL | 0 refills | Status: DC

## 2017-10-01 NOTE — Progress Notes (Signed)
BP 129/73   Pulse (!) 105   Temp 97.7 F (36.5 C) (Oral)   Ht 5' 6"  (1.676 m)   Wt 206 lb 12.8 oz (93.8 kg)   BMI 33.38 kg/m    Subjective:    Patient ID: Tommy Douglas, male    DOB: 03/14/1951, 67 y.o.   MRN: [id number]  HPI: Tommy Douglas is a 67 y.o. male presenting on 10/01/2017 for Edema (1 week rck)  Patient comes in for 1 week recheck: He is edema.  He had been increased on his Bumex last week to 1 pill twice daily.  He cannot tell much of a difference.  The edema still is about the same, being worse in the right leg than the left.  He does have some shortness of breath and is tired after walking for very far.  He is only down a few ounces in our office.  There are no other new symptoms at this time.  We are going to plan nephrology and cardiology follow-ups in the very near future.  Past Medical History:  Diagnosis Date  . Anxiety   . Arthritis    "qwhere" (12/20/2016)  . Chronic lower back pain   . CKD (chronic kidney disease), stage IV ([place])   . Complication of anesthesia    "got the wrong kind of anesthesia ~ 2000 when they were looking down into my stomach"  . Coronary artery disease   . Gout   . Heart disease   . Hyperlipidemia   . Hypertension   . Pneumonia ~ 2015  . Seasonal allergies   . Type II diabetes mellitus (HCC)    Relevant past medical, surgical, family and social history reviewed and updated as indicated. Interim medical history since our last visit reviewed. Allergies and medications reviewed and updated. DATA REVIEWED: CHART IN EPIC  Family History reviewed for pertinent findings.  Review of Systems  Constitutional: Positive for fatigue. Negative for appetite change and fever.  Eyes: Negative for pain and visual disturbance.  Respiratory: Positive for shortness of breath. Negative for cough, chest tightness and wheezing.   Cardiovascular: Positive for leg swelling. Negative for chest pain and palpitations.  Gastrointestinal: Negative.   Negative for abdominal pain, diarrhea, nausea and vomiting.  Genitourinary: Negative.   Skin: Negative.  Negative for color change and rash.  Neurological: Negative.  Negative for weakness, numbness and headaches.  Psychiatric/Behavioral: Negative.     Allergies as of 10/01/2017      Reactions   Atorvastatin    All statins make him cough   Statins Cough   Pt is currently taking pravastatin       Medication List        Accurate as of 10/01/17  2:38 PM. Always use your most recent med list.          acetaminophen 325 MG tablet Commonly known as:  TYLENOL Take 2 tablets (650 mg total) by mouth every 4 (four) hours as needed for headache or mild pain.   allopurinol 100 MG tablet Commonly known as:  ZYLOPRIM Take 1 tablet (100 mg total) 3 (three) times daily by mouth.   ALPRAZolam 0.5 MG tablet Commonly known as:  XANAX TAKE ONE TABLET TWICE DAILY AS NEEDED   aspirin EC 81 MG tablet Take 81 mg by mouth at bedtime.   bumetanide 0.5 MG tablet Commonly known as:  BUMEX Take 2 tablets (1 mg total) by mouth 2 (two) times daily for 7 days.  carvedilol 25 MG tablet Commonly known as:  COREG Take 0.5 tablets (12.5 mg total) by mouth 2 (two) times daily.   cetirizine 10 MG tablet Commonly known as:  ZYRTEC Take 1 tablet (10 mg total) daily as needed by mouth (for allergies.).   clopidogrel 75 MG tablet Commonly known as:  PLAVIX Take 1 tablet (75 mg total) at bedtime by mouth.   cyclobenzaprine 10 MG tablet Commonly known as:  FLEXERIL Take 1 tablet (10 mg total) 3 (three) times daily as needed by mouth for muscle spasms (typically scheduled at bedtime).   Febuxostat 80 MG Tabs Commonly known as:  ULORIC Take 1 tablet (80 mg total) at bedtime by mouth. TAKE ONE (1) TABLET EACH DAY   hydrochlorothiazide 25 MG tablet Commonly known as:  HYDRODIURIL Take 1 tablet (25 mg total) by mouth daily.   HYDROcodone-acetaminophen 10-325 MG tablet Commonly known as:  NORCO Take  1 tablet by mouth every 8 (eight) hours as needed.   HYDROcodone-acetaminophen 10-325 MG tablet Commonly known as:  NORCO Take 1 tablet by mouth every 6 (six) hours as needed.   HYDROcodone-acetaminophen 10-325 MG tablet Commonly known as:  NORCO Take 1 tablet by mouth every 6 (six) hours as needed.   isosorbide mononitrate 30 MG 24 hr tablet Commonly known as:  IMDUR Take 1 tablet (30 mg total) by mouth daily.   liraglutide 18 MG/3ML Sopn Commonly known as:  VICTOZA Inject 0.3 mLs (1.8 mg total) into the skin daily.   LYRICA 200 MG capsule Generic drug:  pregabalin Take 1 capsule (200 mg total) by mouth 2 (two) times daily as needed (for pain.).   nitroGLYCERIN 0.4 MG SL tablet Commonly known as:  NITROSTAT Place 1 tablet (0.4 mg total) under the tongue every 5 (five) minutes as needed for chest pain.   pantoprazole 40 MG tablet Commonly known as:  PROTONIX Take 1 tablet (40 mg total) by mouth daily at 6 (six) AM.   pravastatin 40 MG tablet Commonly known as:  PRAVACHOL Take 1 tablet (40 mg total) every evening by mouth. TAKE ONE (1) TABLET EACH DAY   ULTICARE MICRO PEN NEEDLES 32G X 4 MM Misc Generic drug:  Insulin Pen Needle EVERY DAY   VENTOLIN HFA 108 (90 Base) MCG/ACT inhaler Generic drug:  albuterol Inhale 2 puffs into the lungs every 6 (six) hours as needed for wheezing or shortness of breath.          Objective:    BP 129/73   Pulse (!) 105   Temp 97.7 F (36.5 C) (Oral)   Ht 5' 6"  (1.676 m)   Wt 206 lb 12.8 oz (93.8 kg)   BMI 33.38 kg/m   Allergies  Allergen Reactions  . Atorvastatin     All statins make him cough  . Statins Cough    Pt is currently taking pravastatin     Wt Readings from Last 3 Encounters:  10/01/17 206 lb 12.8 oz (93.8 kg)  09/24/17 207 lb 2 oz (94 kg)  08/22/17 199 lb 3.2 oz (90.4 kg)    Physical Exam  Constitutional: He appears well-developed and well-nourished. No distress.  HENT:  Head: Normocephalic and  atraumatic.  Eyes: Pupils are equal, round, and reactive to light. Conjunctivae and EOM are normal.  Cardiovascular: Normal rate, regular rhythm and normal heart sounds.  2+ edema in pretibial bilaterally  Pulmonary/Chest: Effort normal and breath sounds normal. No respiratory distress.  Skin: Skin is warm and dry.  Psychiatric: He  has a normal mood and affect. His behavior is normal.  Nursing note and vitals reviewed.   Results for orders placed or performed in visit on 09/24/17  CBC with Differential/Platelet  Result Value Ref Range   WBC 6.2 3.4 - 10.8 x10E3/uL   RBC 4.32 4.14 - 5.80 x10E6/uL   Hemoglobin 12.4 (L) 13.0 - 17.7 g/dL   Hematocrit 38.1 37.5 - 51.0 %   MCV 88 79 - 97 fL   MCH 28.7 26.6 - 33.0 pg   MCHC 32.5 31.5 - 35.7 g/dL   RDW 15.7 (H) 12.3 - 15.4 %   Platelets 265 150 - 450 x10E3/uL   Neutrophils 60 Not Estab. %   Lymphs 26 Not Estab. %   Monocytes 8 Not Estab. %   Eos 5 Not Estab. %   Basos 1 Not Estab. %   Neutrophils Absolute 3.7 1.4 - 7.0 x10E3/uL   Lymphocytes Absolute 1.6 0.7 - 3.1 x10E3/uL   Monocytes Absolute 0.5 0.1 - 0.9 x10E3/uL   EOS (ABSOLUTE) 0.3 0.0 - 0.4 x10E3/uL   Basophils Absolute 0.1 0.0 - 0.2 x10E3/uL   Immature Granulocytes 0 Not Estab. %   Immature Grans (Abs) 0.0 0.0 - 0.1 x10E3/uL  CMP14+EGFR  Result Value Ref Range   Glucose 101 (H) 65 - 99 mg/dL   BUN 15 8 - 27 mg/dL   Creatinine, Ser 2.12 (H) 0.76 - 1.27 mg/dL   GFR calc non Af Amer 31 (L) >59 mL/min/1.73   GFR calc Af Amer 36 (L) >59 mL/min/1.73   BUN/Creatinine Ratio 7 (L) 10 - 24   Sodium 144 134 - 144 mmol/L   Potassium 4.5 3.5 - 5.2 mmol/L   Chloride 106 96 - 106 mmol/L   CO2 25 20 - 29 mmol/L   Calcium 9.6 8.6 - 10.2 mg/dL   Total Protein 7.1 6.0 - 8.5 g/dL   Albumin 3.9 3.6 - 4.8 g/dL   Globulin, Total 3.2 1.5 - 4.5 g/dL   Albumin/Globulin Ratio 1.2 1.2 - 2.2   Bilirubin Total 0.4 0.0 - 1.2 mg/dL   Alkaline Phosphatase 94 39 - 117 IU/L   AST 21 0 - 40 IU/L    ALT 11 0 - 44 IU/L  Brain natriuretic peptide  Result Value Ref Range   BNP 37.8 0.0 - 100.0 pg/mL      Assessment & Plan:   1. Congestive heart failure, unspecified HF chronicity, unspecified heart failure type ([place]) - CMP14+EGFR - Ambulatory referral to Cardiology - Ambulatory referral to Nephrology  2. CKD (chronic kidney disease) stage 2, GFR 60-89 ml/min - CMP14+EGFR - Ambulatory referral to Cardiology - Ambulatory referral to Nephrology   Continue all other maintenance medications as listed above.  Follow up plan: Return in about 1 week (around 10/08/2017) for recheck.  Educational handout given for [place] PA-C [facility] [address]  [place], [place] [address] [phone]   10/01/2017, 2:38 PM

## 2017-10-01 NOTE — Patient Instructions (Signed)
In a few days you may receive a survey in the mail or online from [employer] regarding your visit with us today. Please take a moment to fill this out. Your feedback is very important to our whole office. It can help us better understand your needs as well as improve your experience and satisfaction. Thank you for taking your time to complete it. We care about you.  Tarin Johndrow, PA-C  

## 2017-10-02 LAB — CMP14+EGFR
ALT: 12 IU/L (ref 0–44)
AST: 23 IU/L (ref 0–40)
Albumin/Globulin Ratio: 1.1 — ABNORMAL LOW (ref 1.2–2.2)
Albumin: 3.9 g/dL (ref 3.6–4.8)
Alkaline Phosphatase: 95 IU/L (ref 39–117)
BUN / CREAT RATIO: 8 — AB (ref 10–24)
BUN: 15 mg/dL (ref 8–27)
Bilirubin Total: 0.3 mg/dL (ref 0.0–1.2)
CALCIUM: 9.5 mg/dL (ref 8.6–10.2)
CO2: 25 mmol/L (ref 20–29)
CREATININE: 1.9 mg/dL — AB (ref 0.76–1.27)
Chloride: 101 mmol/L (ref 96–106)
GFR calc non Af Amer: 36 mL/min/{1.73_m2} — ABNORMAL LOW (ref >59)
GFR, EST AFRICAN AMERICAN: 41 mL/min/{1.73_m2} — AB (ref >59)
GLUCOSE: 113 mg/dL — AB (ref 65–99)
Globulin, Total: 3.4 g/dL (ref 1.5–4.5)
Potassium: 4.2 mmol/L (ref 3.5–5.2)
Sodium: 142 mmol/L (ref 134–144)
TOTAL PROTEIN: 7.3 g/dL (ref 6.0–8.5)

## 2017-10-08 ENCOUNTER — Ambulatory Visit (INDEPENDENT_AMBULATORY_CARE_PROVIDER_SITE_OTHER): Payer: Medicare Other | Admitting: Physician Assistant

## 2017-10-08 ENCOUNTER — Encounter: Payer: Self-pay | Admitting: Physician Assistant

## 2017-10-08 VITALS — BP 124/65 | HR 99 | Temp 98.0°F | Ht 66.0 in | Wt 202.6 lb

## 2017-10-08 DIAGNOSIS — I10 Essential (primary) hypertension: Secondary | ICD-10-CM

## 2017-10-08 DIAGNOSIS — N183 Chronic kidney disease, stage 3 unspecified: Secondary | ICD-10-CM

## 2017-10-08 DIAGNOSIS — I509 Heart failure, unspecified: Secondary | ICD-10-CM

## 2017-10-08 LAB — CMP14+EGFR
ALK PHOS: 96 IU/L (ref 39–117)
ALT: 13 IU/L (ref 0–44)
AST: 24 IU/L (ref 0–40)
Albumin/Globulin Ratio: 1.4 (ref 1.2–2.2)
Albumin: 4.2 g/dL (ref 3.6–4.8)
BUN/Creatinine Ratio: 8 — ABNORMAL LOW (ref 10–24)
BUN: 17 mg/dL (ref 8–27)
Bilirubin Total: 0.4 mg/dL (ref 0.0–1.2)
CO2: 26 mmol/L (ref 20–29)
CREATININE: 2.01 mg/dL — AB (ref 0.76–1.27)
Calcium: 9.6 mg/dL (ref 8.6–10.2)
Chloride: 99 mmol/L (ref 96–106)
GFR calc Af Amer: 39 mL/min/{1.73_m2} — ABNORMAL LOW (ref >59)
GFR calc non Af Amer: 33 mL/min/{1.73_m2} — ABNORMAL LOW (ref >59)
GLUCOSE: 168 mg/dL — AB (ref 65–99)
Globulin, Total: 2.9 g/dL (ref 1.5–4.5)
Potassium: 4.2 mmol/L (ref 3.5–5.2)
Sodium: 140 mmol/L (ref 134–144)
Total Protein: 7.1 g/dL (ref 6.0–8.5)

## 2017-10-08 MED ORDER — BUMETANIDE 0.5 MG PO TABS: 1.5000 mg | ORAL_TABLET | Freq: Two times a day (BID) | ORAL | 0 refills | Status: DC

## 2017-10-09 NOTE — Progress Notes (Signed)
BP 124/65   Pulse 99   Temp 98 F (36.7 C) (Oral)   Ht _0  (1.676 m)   Wt 202 lb 9.6 oz (91.9 kg)   BMI 32.70 kg/m    Subjective:    Patient ID: Tommy Douglas, male    DOB: 20-Jul-1950, 67 y.o.   MRN: [id number]  HPI: Tommy Douglas is a 67 y.o. male presenting on 10/08/2017 for Edema (rck legs ) and Congestive Heart Failure  This patient comes in for 1 week recheck on his congestive heart failure.  He had increased his Bumex to 1 mg twice daily.  He reports that he is tolerating it well.  We will draw labs to see how his kidney functions are doing.  He does have an appointment this week with his cardiologist.  He did come down 4 pounds this week.  He states he still has significant shortness of breath and edema in his lower legs.  Past Medical History:  Diagnosis Date  . Anxiety   . Arthritis    "qwhere" (12/20/2016)  . Chronic lower back pain   . CKD (chronic kidney disease), stage IV ([place])   . Complication of anesthesia    "got the wrong kind of anesthesia ~ 2000 when they were looking down into my stomach"  . Coronary artery disease   . Gout   . Heart disease   . Hyperlipidemia   . Hypertension   . Pneumonia ~ 2015  . Seasonal allergies   . Type II diabetes mellitus (HCC)    Relevant past medical, surgical, family and social history reviewed and updated as indicated. Interim medical history since our last visit reviewed. Allergies and medications reviewed and updated. DATA REVIEWED: CHART IN EPIC  Family History reviewed for pertinent findings.  Review of Systems  Constitutional: Negative.  Negative for appetite change and fatigue.  Eyes: Negative for pain and visual disturbance.  Respiratory: Positive for shortness of breath. Negative for cough, chest tightness and wheezing.   Cardiovascular: Positive for leg swelling. Negative for chest pain and palpitations.  Gastrointestinal: Negative.  Negative for abdominal pain, diarrhea, nausea and vomiting.    Genitourinary: Negative.   Skin: Negative.  Negative for color change and rash.  Neurological: Negative.  Negative for weakness, numbness and headaches.  Psychiatric/Behavioral: Negative.     Allergies as of 10/08/2017      Reactions   Atorvastatin    All statins make him cough   Statins Cough   Pt is currently taking pravastatin       Medication List        Accurate as of 10/08/17 11:59 PM. Always use your most recent med list.          acetaminophen 325 MG tablet Commonly known as:  TYLENOL Take 2 tablets (650 mg total) by mouth every 4 (four) hours as needed for headache or mild pain.   allopurinol 100 MG tablet Commonly known as:  ZYLOPRIM Take 1 tablet (100 mg total) 3 (three) times daily by mouth.   ALPRAZolam 0.5 MG tablet Commonly known as:  XANAX TAKE ONE TABLET TWICE DAILY AS NEEDED   aspirin EC 81 MG tablet Take 81 mg by mouth at bedtime.   bumetanide 0.5 MG tablet Commonly known as:  BUMEX Take 3 tablets (1.5 mg total) by mouth 2 (two) times daily for 7 days.   carvedilol 25 MG tablet Commonly known as:  COREG Take 0.5 tablets (12.5 mg total) by mouth  2 (two) times daily.   cetirizine 10 MG tablet Commonly known as:  ZYRTEC Take 1 tablet (10 mg total) daily as needed by mouth (for allergies.).   clopidogrel 75 MG tablet Commonly known as:  PLAVIX Take 1 tablet (75 mg total) at bedtime by mouth.   cyclobenzaprine 10 MG tablet Commonly known as:  FLEXERIL Take 1 tablet (10 mg total) 3 (three) times daily as needed by mouth for muscle spasms (typically scheduled at bedtime).   Febuxostat 80 MG Tabs Commonly known as:  ULORIC Take 1 tablet (80 mg total) at bedtime by mouth. TAKE ONE (1) TABLET EACH DAY   hydrochlorothiazide 25 MG tablet Commonly known as:  HYDRODIURIL Take 1 tablet (25 mg total) by mouth daily.   HYDROcodone-acetaminophen 10-325 MG tablet Commonly known as:  NORCO Take 1 tablet by mouth every 8 (eight) hours as needed.    HYDROcodone-acetaminophen 10-325 MG tablet Commonly known as:  NORCO Take 1 tablet by mouth every 6 (six) hours as needed.   HYDROcodone-acetaminophen 10-325 MG tablet Commonly known as:  NORCO Take 1 tablet by mouth every 6 (six) hours as needed.   isosorbide mononitrate 30 MG 24 hr tablet Commonly known as:  IMDUR Take 1 tablet (30 mg total) by mouth daily.   liraglutide 18 MG/3ML Sopn Commonly known as:  VICTOZA Inject 0.3 mLs (1.8 mg total) into the skin daily.   LYRICA 200 MG capsule Generic drug:  pregabalin Take 1 capsule (200 mg total) by mouth 2 (two) times daily as needed (for pain.).   nitroGLYCERIN 0.4 MG SL tablet Commonly known as:  NITROSTAT Place 1 tablet (0.4 mg total) under the tongue every 5 (five) minutes as needed for chest pain.   pantoprazole 40 MG tablet Commonly known as:  PROTONIX Take 1 tablet (40 mg total) by mouth daily at 6 (six) AM.   pravastatin 40 MG tablet Commonly known as:  PRAVACHOL Take 1 tablet (40 mg total) every evening by mouth. TAKE ONE (1) TABLET EACH DAY   ULTICARE MICRO PEN NEEDLES 32G X 4 MM Misc Generic drug:  Insulin Pen Needle EVERY DAY   VENTOLIN HFA 108 (90 Base) MCG/ACT inhaler Generic drug:  albuterol Inhale 2 puffs into the lungs every 6 (six) hours as needed for wheezing or shortness of breath.          Objective:    BP 124/65   Pulse 99   Temp 98 F (36.7 C) (Oral)   Ht _0  (1.676 m)   Wt 202 lb 9.6 oz (91.9 kg)   BMI 32.70 kg/m   Allergies  Allergen Reactions  . Atorvastatin     All statins make him cough  . Statins Cough    Pt is currently taking pravastatin     Wt Readings from Last 3 Encounters:  10/08/17 202 lb 9.6 oz (91.9 kg)  10/01/17 206 lb 12.8 oz (93.8 kg)  09/24/17 207 lb 2 oz (94 kg)    Physical Exam  Constitutional: He appears well-developed and well-nourished. No distress.  HENT:  Head: Normocephalic and atraumatic.  Eyes: Pupils are equal, round, and reactive to light.  Conjunctivae and EOM are normal.  Cardiovascular: Normal rate, regular rhythm and normal heart sounds.  2+ pitting edema in bilateral pretib  Pulmonary/Chest: Effort normal and breath sounds normal. No respiratory distress.  Skin: Skin is warm and dry.  Psychiatric: He has a normal mood and affect. His behavior is normal.  Nursing note and vitals reviewed.  Results for orders placed or performed in visit on 10/08/17  CMP14+EGFR  Result Value Ref Range   Glucose 168 (H) 65 - 99 mg/dL   BUN 17 8 - 27 mg/dL   Creatinine, Ser 2.01 (H) 0.76 - 1.27 mg/dL   GFR calc non Af Amer 33 (L) >59 mL/min/1.73   GFR calc Af Amer 39 (L) >59 mL/min/1.73   BUN/Creatinine Ratio 8 (L) 10 - 24   Sodium 140 134 - 144 mmol/L   Potassium 4.2 3.5 - 5.2 mmol/L   Chloride 99 96 - 106 mmol/L   CO2 26 20 - 29 mmol/L   Calcium 9.6 8.6 - 10.2 mg/dL   Total Protein 7.1 6.0 - 8.5 g/dL   Albumin 4.2 3.6 - 4.8 g/dL   Globulin, Total 2.9 1.5 - 4.5 g/dL   Albumin/Globulin Ratio 1.4 1.2 - 2.2   Bilirubin Total 0.4 0.0 - 1.2 mg/dL   Alkaline Phosphatase 96 39 - 117 IU/L   AST 24 0 - 40 IU/L   ALT 13 0 - 44 IU/L      Assessment & Plan:   1. Essential hypertension - CMP14+EGFR  2. Congestive heart failure, unspecified HF chronicity, unspecified heart failure type [facility]) Keep cardiology appointment  3. Stage 3 chronic kidney disease (HCC) - CMP14+EGFR   Continue all other maintenance medications as listed above.  Follow up plan: No follow-ups on file.  Educational handout given for [place] PA-C [facility] [address]  [place], [place] [address] [phone]   10/09/2017, 1:22 PM

## 2017-10-10 ENCOUNTER — Telehealth: Payer: Self-pay | Admitting: Cardiovascular Disease

## 2017-10-10 ENCOUNTER — Encounter: Payer: Self-pay | Admitting: Cardiovascular Disease

## 2017-10-10 ENCOUNTER — Ambulatory Visit (INDEPENDENT_AMBULATORY_CARE_PROVIDER_SITE_OTHER): Payer: Medicare Other | Admitting: Cardiovascular Disease

## 2017-10-10 VITALS — BP 122/73 | HR 109 | Ht 66.0 in | Wt 202.2 lb

## 2017-10-10 DIAGNOSIS — Z9861 Coronary angioplasty status: Secondary | ICD-10-CM

## 2017-10-10 DIAGNOSIS — R0602 Shortness of breath: Secondary | ICD-10-CM | POA: Diagnosis not present

## 2017-10-10 DIAGNOSIS — N183 Chronic kidney disease, stage 3 unspecified: Secondary | ICD-10-CM

## 2017-10-10 DIAGNOSIS — Z955 Presence of coronary angioplasty implant and graft: Secondary | ICD-10-CM

## 2017-10-10 DIAGNOSIS — E785 Hyperlipidemia, unspecified: Secondary | ICD-10-CM | POA: Diagnosis not present

## 2017-10-10 DIAGNOSIS — Z720 Tobacco use: Secondary | ICD-10-CM

## 2017-10-10 DIAGNOSIS — I739 Peripheral vascular disease, unspecified: Secondary | ICD-10-CM | POA: Diagnosis not present

## 2017-10-10 DIAGNOSIS — I2510 Atherosclerotic heart disease of native coronary artery without angina pectoris: Secondary | ICD-10-CM

## 2017-10-10 DIAGNOSIS — I25118 Atherosclerotic heart disease of native coronary artery with other forms of angina pectoris: Secondary | ICD-10-CM

## 2017-10-10 DIAGNOSIS — I5033 Acute on chronic diastolic (congestive) heart failure: Secondary | ICD-10-CM

## 2017-10-10 DIAGNOSIS — I10 Essential (primary) hypertension: Secondary | ICD-10-CM | POA: Diagnosis not present

## 2017-10-10 MED ORDER — PRAVASTATIN SODIUM 80 MG PO TABS: 80.0000 mg | ORAL_TABLET | Freq: Every evening | ORAL | 1 refills | Status: DC

## 2017-10-10 NOTE — Addendum Note (Signed)
Addended by: Julian Hy T on: 10/10/2017 05:04 PM   Modules accepted: Orders

## 2017-10-10 NOTE — Telephone Encounter (Signed)
Echo scheduled in [place]  10/11/17 at 12:00

## 2017-10-10 NOTE — Patient Instructions (Addendum)
Your physician recommends that you schedule a follow-up appointment in: NEXT AVAILABLE WITH DR [facility]  Your physician has recommended you make the following change in your medication:   INCREASE PRAVASTATIN 80 MG DAILY    Your physician has requested that you have an echocardiogram. Echocardiography is a painless test that uses sound waves to create images of your heart. It provides your doctor with information about the size and shape of your heart and how well your heart's chambers and valves are working. This procedure takes approximately one hour. There are no restrictions for this procedure.  A chest x-ray takes a picture of the organs and structures inside the chest, including the heart, lungs, and blood vessels. This test can show several things, including, whether the heart is enlarges; whether fluid is building up in the lungs; and whether pacemaker / defibrillator leads are still in place.

## 2017-10-10 NOTE — Progress Notes (Signed)
SUBJECTIVE: The patient presents for routine follow-up.  His PCP has been managing CHF with bilateral leg edema and exertional dyspnea.  Bumex has been increased to 1.5 mg twice daily 2 days ago.  His weight came down 4 pounds over the past week.  He does have continued exertional dyspnea and bilateral leg edema. He also has chronic kidney disease stage III.  BUN 17 and creatinine 2.01 on 10/08/2017.  He also has coronary artery disease. He underwent coronary angiography and PCI on 12/21/16 with drug-eluting stent placement to the mid RCA. He had residual 99% stenosis of the proximal to mid circumflex. The proximal segment filled distally from right to left and left to left collaterals. The interventional team reviewed the circumflex artery disease and did not feel it needed intervention.  He did not follow-up with Dr. Gwenlyn Found for peripheral vascular disease.  He was to go angiography.  Echocardiogram on 12/22/2016 demonstrated normal left ventricular systolic function and regional wall motion, LVEF 55 to 60%, with grade 2 diastolic dysfunction.  He denies chest pain.  He has not had to take nitroglycerin since his last visit with me.  He is gone from a waist 34 to waist 38 in the past several months.  He rarely eats out and they cook at home and they do not add salt.  He is urinating but says that the odor of his urine has changed.   Review of Systems: As per "subjective", otherwise negative.  Allergies  Allergen Reactions  . Atorvastatin     All statins make him cough  . Statins Cough    Pt is currently taking pravastatin     Current Outpatient Medications  Medication Sig Dispense Refill  . acetaminophen (TYLENOL) 325 MG tablet Take 2 tablets (650 mg total) by mouth every 4 (four) hours as needed for headache or mild pain.    [name] allopurinol (ZYLOPRIM) 100 MG tablet Take 1 tablet (100 mg total) 3 (three) times daily by mouth. 270 tablet 3  . ALPRAZolam (XANAX) 0.5 MG tablet TAKE ONE  TABLET TWICE DAILY AS NEEDED 180 tablet 1  . aspirin EC 81 MG tablet Take 81 mg by mouth at bedtime.     . bumetanide (BUMEX) 0.5 MG tablet Take 3 tablets (1.5 mg total) by mouth 2 (two) times daily for 7 days. 90 tablet 0  . carvedilol (COREG) 25 MG tablet Take 0.5 tablets (12.5 mg total) by mouth 2 (two) times daily. 180 tablet 3  . cetirizine (ZYRTEC) 10 MG tablet Take 1 tablet (10 mg total) daily as needed by mouth (for allergies.). 90 tablet 3  . clopidogrel (PLAVIX) 75 MG tablet Take 1 tablet (75 mg total) at bedtime by mouth. 90 tablet 3  . cyclobenzaprine (FLEXERIL) 10 MG tablet Take 1 tablet (10 mg total) 3 (three) times daily as needed by mouth for muscle spasms (typically scheduled at bedtime). 90 tablet 5  . Febuxostat (ULORIC) 80 MG TABS Take 1 tablet (80 mg total) at bedtime by mouth. TAKE ONE (1) TABLET EACH DAY 90 tablet 3  . hydrochlorothiazide (HYDRODIURIL) 25 MG tablet Take 1 tablet (25 mg total) by mouth daily. 90 tablet 3  . HYDROcodone-acetaminophen (NORCO) 10-325 MG tablet Take 1 tablet by mouth every 8 (eight) hours as needed. 120 tablet 0  . HYDROcodone-acetaminophen (NORCO) 10-325 MG tablet Take 1 tablet by mouth every 6 (six) hours as needed. 120 tablet 0  . HYDROcodone-acetaminophen (NORCO) 10-325 MG tablet Take 1 tablet by  mouth every 6 (six) hours as needed. 120 tablet 0  . isosorbide mononitrate (IMDUR) 30 MG 24 hr tablet Take 1 tablet (30 mg total) by mouth daily. 90 tablet 3  . liraglutide (VICTOZA) 18 MG/3ML SOPN Inject 0.3 mLs (1.8 mg total) into the skin daily. 27 mL 13  . LYRICA 200 MG capsule Take 1 capsule (200 mg total) by mouth 2 (two) times daily as needed (for pain.).    [name] pantoprazole (PROTONIX) 40 MG tablet Take 1 tablet (40 mg total) by mouth daily at 6 (six) AM. 30 tablet 5  . pravastatin (PRAVACHOL) 40 MG tablet Take 1 tablet (40 mg total) every evening by mouth. TAKE ONE (1) TABLET EACH DAY 90 tablet 3  . ULTICARE MICRO PEN NEEDLES 32G X 4 MM MISC  EVERY DAY 100 each 3  . VENTOLIN HFA 108 (90 Base) MCG/ACT inhaler Inhale 2 puffs into the lungs every 6 (six) hours as needed for wheezing or shortness of breath. 18 g 6  . nitroGLYCERIN (NITROSTAT) 0.4 MG SL tablet Place 1 tablet (0.4 mg total) under the tongue every 5 (five) minutes as needed for chest pain. 25 tablet 3   No current facility-administered medications for this visit.     Past Medical History:  Diagnosis Date  . Anxiety   . Arthritis    "qwhere" (12/20/2016)  . Chronic lower back pain   . CKD (chronic kidney disease), stage IV ([place])   . Complication of anesthesia    "got the wrong kind of anesthesia ~ 2000 when they were looking down into my stomach"  . Coronary artery disease   . Gout   . Heart disease   . Hyperlipidemia   . Hypertension   . Pneumonia ~ 2015  . Seasonal allergies   . Type II diabetes mellitus ([place])     Past Surgical History:  Procedure Laterality Date  . CARDIAC CATHETERIZATION  12/20/2016  . CORONARY ANGIOPLASTY WITH STENT PLACEMENT  2004 X 2   "1st stent moved"  . CORONARY STENT INTERVENTION N/A 12/21/2016   Procedure: CORONARY STENT INTERVENTION;  Surgeon: Burnell Blanks, MD;  Location: [facility];  Service: Cardiovascular;  Laterality: N/A;  . ESOPHAGOGASTRODUODENOSCOPY  ~ 2000  . LAPAROSCOPIC CHOLECYSTECTOMY    . LEFT HEART CATH AND CORONARY ANGIOGRAPHY N/A 12/20/2016   Procedure: LEFT HEART CATH AND CORONARY ANGIOGRAPHY;  Surgeon: Burnell Blanks, MD;  Location: [facility];  Service: Cardiovascular;  Laterality: N/A;    Social History   Socioeconomic History  . Marital status: Widowed    Spouse name: Not on file  . Number of children: 7  . Years of education: Not on file  . Highest education level: Not on file  Occupational History  . Occupation: retired     Comment: Designer, television/film set and Manufacturing engineer co.  Social Needs  . Financial resource strain: Not on file  . Food insecurity:    Worry:  Not on file    Inability: Not on file  . Transportation needs:    Medical: Not on file    Non-medical: Not on file  Tobacco Use  . Smoking status: Current Every Day Smoker    Packs/day: 0.50    Years: 44.00    Pack years: 22.00    Types: Cigarettes  . Smokeless tobacco: Never Used  Substance and Sexual Activity  . Alcohol use: No  . Drug use: No  . Sexual activity: Not Currently  Lifestyle  . Physical  activity:    Days per week: Not on file    Minutes per session: Not on file  . Stress: Not on file  Relationships  . Social connections:    Talks on phone: Not on file    Gets together: Not on file    Attends religious service: Not on file    Active member of club or organization: Not on file    Attends meetings of clubs or organizations: Not on file    Relationship status: Not on file  . Intimate partner violence:    Fear of current or ex partner: Not on file    Emotionally abused: Not on file    Physically abused: Not on file    Forced sexual activity: Not on file  Other Topics Concern  . Not on file  Social History Narrative  . Not on file     Vitals:   10/10/17 1616  BP: 122/73  Pulse: (!) 109  SpO2: 94%  Weight: 202 lb 3.2 oz (91.7 kg)  Height: 5\' 6"  (1.676 m)    Wt Readings from Last 3 Encounters:  10/10/17 202 lb 3.2 oz (91.7 kg)  10/08/17 202 lb 9.6 oz (91.9 kg)  10/01/17 206 lb 12.8 oz (93.8 kg)     PHYSICAL EXAM General: NAD HEENT: Normal. Neck: No JVD, no thyromegaly. Lungs: Diminished throughout, no crackles or wheezes. CV: Regular rate and rhythm, normal S1/S2, no S3/S4, no murmur.  1+ pitting bilateral lower extremity edema.  No carotid bruit.   Abdomen: Soft, nontender, no distention.  Neurologic: Alert and oriented.  Psych: Normal affect. Skin: Normal. Musculoskeletal: No gross deformities.    ECG: Reviewed above under Subjective   Labs: Lab Results  Component Value Date/Time   K 4.2 10/08/2017 11:31 AM   BUN 17 10/08/2017  11:31 AM   CREATININE 2.01 (H) 10/08/2017 11:31 AM   ALT 13 10/08/2017 11:31 AM   TSH 1.730 07/27/2017 12:32 PM   HGB 12.4 (L) 09/24/2017 05:12 PM     Lipids: Lab Results  Component Value Date/Time   LDLCALC 92 07/27/2017 12:32 PM   CHOL 153 07/27/2017 12:32 PM   TRIG 180 (H) 07/27/2017 12:32 PM   HDL 25 (L) 07/27/2017 12:32 PM       ASSESSMENT AND PLAN: 1.  Acute on chronic diastolic heart failure: Weight was 186 pounds when he last saw me on 01/11/2017.  He now weighs 202 pounds but has lost 4 pounds since 10/01/2017.  He is on carvedilol 12.5 mg twice daily, hydrochlorothiazide 25 mg daily, Imdur 30 mg and Bumex 1.5 mg twice daily.  I will obtain an echocardiogram to evaluate for interval changes in cardiac structure and function.  BNP normal at 37.8 on 09/24/2017.  BUN and creatinine reviewed above.  I will also obtain a chest x-ray.  I would consider switching Bumex to torsemide and perhaps adding intermittent metolazone for a more potent diuretic effect.  Utilizing a loop diuretic and thiazide diuretic together may also eventually lead to a more potent diuretic effect over time.  2.  Coronary artery disease: Currently on aspirin, carvedilol, Plavix, Imdur 30 mg, and pravastatin 40 mg.  I will increase pravastatin to 80 mg.  I may continue dual antiplatelet therapy indefinitely.  3.  Hyperlipidemia: LDL 92 on 07/27/2017.  I will increase pravastatin 80 mg.  4.  Chronic kidney disease stage III: BUN and creatinine reviewed above.  This will need continued monitoring.  5.  Peripheral vascular disease: On aspirin  and statin therapy. He has a long history of tobacco abuse. ABIs showed aortoiliac atherosclerosis with greater than 50% stenosis of the bilateral external iliac arteries, left greater than right, 50-74% stenosis of the mid and distal right SFA, 30-49% stenosis of the left mid SFA, and 50-74% stenosis of the left distal SFA. There was two-vessel runoff bilaterally with both posterior  tibial arteries being occluded. He needs tobacco cessation. He should follow up with Dr. Gwenlyn Found as previously planned.  He said it is too difficult for him to drive to [place].  6.  Hypertension: Blood pressure is normal.    Disposition: Follow up in 2 months  Time spent: 40 minutes, of which greater than 50% was spent reviewing symptoms, relevant blood tests and studies, and discussing management plan with the patient.    Kate Sable, M.D., F.A.C.C.

## 2017-10-11 ENCOUNTER — Ambulatory Visit (INDEPENDENT_AMBULATORY_CARE_PROVIDER_SITE_OTHER): Payer: Medicare Other

## 2017-10-11 ENCOUNTER — Other Ambulatory Visit: Payer: Self-pay

## 2017-10-11 DIAGNOSIS — I5033 Acute on chronic diastolic (congestive) heart failure: Secondary | ICD-10-CM | POA: Diagnosis not present

## 2017-10-12 ENCOUNTER — Telehealth: Payer: Self-pay | Admitting: *Deleted

## 2017-10-12 NOTE — Telephone Encounter (Signed)
Notes recorded by Laurine Blazer, LPN on 08/12/8240 at 99:80 AM EDT Patient notified. Copy to pmd. Follow up scheduled for 11/23/2017 with Dr. Bronson Ing.   ------  Notes recorded by Herminio Commons, MD on 10/11/2017 at 3:43 PM EDT Normal pumping function. Mild stiffness with heart relaxation.

## 2017-10-19 ENCOUNTER — Ambulatory Visit (HOSPITAL_COMMUNITY)
Admission: RE | Admit: 2017-10-19 | Discharge: 2017-10-19 | Disposition: A | Discharge status: Home / Self Care | Payer: Medicare Other | Source: Ambulatory Visit | Attending: Cardiovascular Disease | Admitting: Cardiovascular Disease

## 2017-10-19 DIAGNOSIS — R0602 Shortness of breath: Secondary | ICD-10-CM | POA: Diagnosis not present

## 2017-10-25 ENCOUNTER — Other Ambulatory Visit: Payer: Self-pay | Admitting: Physician Assistant

## 2017-10-26 ENCOUNTER — Telehealth: Payer: Self-pay | Admitting: *Deleted

## 2017-10-26 NOTE — Telephone Encounter (Signed)
Notes recorded by Laurine Blazer, LPN on 11/29/8014 at 5:53 PM EDT Patient notified. Copy to pmd. Follow up scheduled for 11/23/2017 with Dr. Bronson Ing in [place].   ------  Notes recorded by Herminio Commons, MD on 10/24/2017 at 4:37 PM EDT No evidence of CHF. Mild bronchitic changes. Please forward to PCP.

## 2017-10-29 ENCOUNTER — Ambulatory Visit (INDEPENDENT_AMBULATORY_CARE_PROVIDER_SITE_OTHER): Payer: Medicare Other | Admitting: Physician Assistant

## 2017-10-29 ENCOUNTER — Encounter: Payer: Self-pay | Admitting: Physician Assistant

## 2017-10-29 VITALS — BP 133/73 | HR 99 | Temp 97.7°F | Ht 66.0 in | Wt 204.6 lb

## 2017-10-29 DIAGNOSIS — N182 Chronic kidney disease, stage 2 (mild): Secondary | ICD-10-CM

## 2017-10-29 DIAGNOSIS — M5136 Other intervertebral disc degeneration, lumbar region: Secondary | ICD-10-CM

## 2017-10-29 DIAGNOSIS — J3089 Other allergic rhinitis: Secondary | ICD-10-CM | POA: Diagnosis not present

## 2017-10-29 DIAGNOSIS — E1122 Type 2 diabetes mellitus with diabetic chronic kidney disease: Secondary | ICD-10-CM

## 2017-10-29 DIAGNOSIS — I509 Heart failure, unspecified: Secondary | ICD-10-CM

## 2017-10-29 LAB — BAYER DCA HB A1C WAIVED: HB A1C: 6.1 % (ref <7.0)

## 2017-10-29 MED ORDER — HYDROCODONE-ACETAMINOPHEN 10-325 MG PO TABS: 1.0000 | ORAL_TABLET | Freq: Three times a day (TID) | ORAL | 0 refills | Status: DC | PRN

## 2017-10-29 MED ORDER — HYDROCODONE-ACETAMINOPHEN 10-325 MG PO TABS: 1.0000 | ORAL_TABLET | Freq: Four times a day (QID) | ORAL | 0 refills | Status: DC | PRN

## 2017-10-29 MED ORDER — ALPRAZOLAM 0.5 MG PO TABS: ORAL_TABLET | ORAL | 1 refills | Status: DC

## 2017-10-29 MED ORDER — CYCLOBENZAPRINE HCL 10 MG PO TABS: 10.0000 mg | ORAL_TABLET | Freq: Three times a day (TID) | ORAL | 5 refills | Status: DC | PRN

## 2017-10-29 MED ORDER — CETIRIZINE HCL 10 MG PO TABS: 10.0000 mg | ORAL_TABLET | Freq: Every day | ORAL | 3 refills | Status: DC | PRN

## 2017-10-29 NOTE — Progress Notes (Signed)
BP 133/73   Pulse 99   Temp 97.7 F (36.5 C) (Oral)   Ht 5' 6"  (1.676 m)   Wt 204 lb 9.6 oz (92.8 kg)   BMI 33.02 kg/m    Subjective:    Patient ID: Tommy Douglas, male    DOB: 1951/02/23, 67 y.o.   MRN: [id number]  HPI: Tommy Douglas is a 67 y.o. male presenting on 10/29/2017 for Hypertension; Diabetes; Hyperlipidemia; and Edema  This patient comes in for 27-monthfollow-up for his congestive heart failure, type 2 diabetes with chronic kidney disease, allergic rhinitis, did disease.  He does need his Zyrtec refill.  He states that it was doing very well for his allergies.  He has been out.  During this interim he has had significant issues with his edema in his lower legs.  He has been seen by Dr. KBronson Ingin the past couple weeks.  They are trying to get echocardiogram approved.  He does not have a set date for this.  He states there was a change in his medications.  He is doing everything well.  Uloric has been stopped due to cardiovascular risk.  Chronic pain is under control, patient has no new complaints. Medications are keeping things stable. Needs refills for the next three months.  [place] Controlled Substance website checked and normal. Drug screen normal this year.   Past Medical History:  Diagnosis Date  . Anxiety   . Arthritis    "qwhere" (12/20/2016)  . Chronic lower back pain   . CKD (chronic kidney disease), stage IV ([place]   . Complication of anesthesia    "got the wrong kind of anesthesia ~ 2000 when they were looking down into my stomach"  . Coronary artery disease   . Gout   . Heart disease   . Hyperlipidemia   . Hypertension   . Pneumonia ~ 2015  . Seasonal allergies   . Type II diabetes mellitus ([facility])    Relevant past medical, surgical, family and social history reviewed and updated as indicated. Interim medical history since our last visit reviewed. Allergies and medications reviewed and updated. DATA REVIEWED: CHART IN EPIC  Family History  reviewed for pertinent findings.  Review of Systems  Constitutional: Negative.  Negative for appetite change and fatigue.  Eyes: Negative for pain and visual disturbance.  Respiratory: Negative.  Negative for cough, chest tightness, shortness of breath and wheezing.   Cardiovascular: Negative.  Negative for chest pain, palpitations and leg swelling.  Gastrointestinal: Negative.  Negative for abdominal pain, diarrhea, nausea and vomiting.  Genitourinary: Negative.   Musculoskeletal: Positive for arthralgias, back pain, joint swelling and myalgias.  Skin: Negative.  Negative for color change and rash.  Neurological: Negative.  Negative for weakness, numbness and headaches.  Psychiatric/Behavioral: Negative.     Allergies as of 10/29/2017      Reactions   Atorvastatin    All statins make him cough   Statins Cough   Pt is currently taking pravastatin       Medication List        Accurate as of 10/29/17 11:11 AM. Always use your most recent med list.          acetaminophen 325 MG tablet Commonly known as:  TYLENOL Take 2 tablets (650 mg total) by mouth every 4 (four) hours as needed for headache or mild pain.   allopurinol 100 MG tablet Commonly known as:  ZYLOPRIM Take 1 tablet (100 mg total) 3 (three)  times daily by mouth.   ALPRAZolam 0.5 MG tablet Commonly known as:  XANAX TAKE ONE TABLET TWICE DAILY AS NEEDED   aspirin EC 81 MG tablet Take 81 mg by mouth at bedtime.   bumetanide 0.5 MG tablet Commonly known as:  BUMEX Take 3 tablets (1.5 mg total) by mouth 2 (two) times daily for 7 days.   carvedilol 25 MG tablet Commonly known as:  COREG Take 0.5 tablets (12.5 mg total) by mouth 2 (two) times daily.   cetirizine 10 MG tablet Commonly known as:  ZYRTEC Take 1 tablet (10 mg total) by mouth daily as needed (for allergies.).   clopidogrel 75 MG tablet Commonly known as:  PLAVIX Take 1 tablet (75 mg total) at bedtime by mouth.   cyclobenzaprine 10 MG  tablet Commonly known as:  FLEXERIL Take 1 tablet (10 mg total) by mouth 3 (three) times daily as needed for muscle spasms (typically scheduled at bedtime).   hydrochlorothiazide 25 MG tablet Commonly known as:  HYDRODIURIL Take 1 tablet (25 mg total) by mouth daily.   HYDROcodone-acetaminophen 10-325 MG tablet Commonly known as:  NORCO Take 1 tablet by mouth every 8 (eight) hours as needed.   HYDROcodone-acetaminophen 10-325 MG tablet Commonly known as:  NORCO Take 1 tablet by mouth every 6 (six) hours as needed.   HYDROcodone-acetaminophen 10-325 MG tablet Commonly known as:  NORCO Take 1 tablet by mouth every 6 (six) hours as needed.   isosorbide mononitrate 30 MG 24 hr tablet Commonly known as:  IMDUR Take 1 tablet (30 mg total) by mouth daily.   liraglutide 18 MG/3ML Sopn Commonly known as:  VICTOZA Inject 0.3 mLs (1.8 mg total) into the skin daily.   LYRICA 200 MG capsule Generic drug:  pregabalin Take 1 capsule (200 mg total) by mouth 2 (two) times daily as needed (for pain.).   nitroGLYCERIN 0.4 MG SL tablet Commonly known as:  NITROSTAT Place 1 tablet (0.4 mg total) under the tongue every 5 (five) minutes as needed for chest pain.   pantoprazole 40 MG tablet Commonly known as:  PROTONIX Take 1 tablet (40 mg total) by mouth daily at 6 (six) AM.   pravastatin 80 MG tablet Commonly known as:  PRAVACHOL Take 1 tablet (80 mg total) by mouth every evening.   ULTICARE MICRO PEN NEEDLES 32G X 4 MM Misc Generic drug:  Insulin Pen Needle EVERY DAY   VENTOLIN HFA 108 (90 Base) MCG/ACT inhaler Generic drug:  albuterol Inhale 2 puffs into the lungs every 6 (six) hours as needed for wheezing or shortness of breath.          Objective:    BP 133/73   Pulse 99   Temp 97.7 F (36.5 C) (Oral)   Ht 5' 6"  (1.676 m)   Wt 204 lb 9.6 oz (92.8 kg)   BMI 33.02 kg/m   Allergies  Allergen Reactions  . Atorvastatin     All statins make him cough  . Statins Cough     Pt is currently taking pravastatin     Wt Readings from Last 3 Encounters:  10/29/17 204 lb 9.6 oz (92.8 kg)  10/10/17 202 lb 3.2 oz (91.7 kg)  10/08/17 202 lb 9.6 oz (91.9 kg)    Physical Exam  Constitutional: He appears well-developed and well-nourished. No distress.  HENT:  Head: Normocephalic and atraumatic.  Eyes: Pupils are equal, round, and reactive to light. Conjunctivae and EOM are normal.  Cardiovascular: Normal rate, regular rhythm and  normal heart sounds.  1+ edema bilaterally  Pulmonary/Chest: Effort normal and breath sounds normal. No respiratory distress.  Skin: Skin is warm and dry.  Psychiatric: He has a normal mood and affect. His behavior is normal.  Nursing note and vitals reviewed.   Results for orders placed or performed in visit on 10/08/17  CMP14+EGFR  Result Value Ref Range   Glucose 168 (H) 65 - 99 mg/dL   BUN 17 8 - 27 mg/dL   Creatinine, Ser 2.01 (H) 0.76 - 1.27 mg/dL   GFR calc non Af Amer 33 (L) >59 mL/min/1.73   GFR calc Af Amer 39 (L) >59 mL/min/1.73   BUN/Creatinine Ratio 8 (L) 10 - 24   Sodium 140 134 - 144 mmol/L   Potassium 4.2 3.5 - 5.2 mmol/L   Chloride 99 96 - 106 mmol/L   CO2 26 20 - 29 mmol/L   Calcium 9.6 8.6 - 10.2 mg/dL   Total Protein 7.1 6.0 - 8.5 g/dL   Albumin 4.2 3.6 - 4.8 g/dL   Globulin, Total 2.9 1.5 - 4.5 g/dL   Albumin/Globulin Ratio 1.4 1.2 - 2.2   Bilirubin Total 0.4 0.0 - 1.2 mg/dL   Alkaline Phosphatase 96 39 - 117 IU/L   AST 24 0 - 40 IU/L   ALT 13 0 - 44 IU/L      Assessment & Plan:   1. DDD (degenerative disc disease), lumbar - HYDROcodone-acetaminophen (NORCO) 10-325 MG tablet; Take 1 tablet by mouth every 8 (eight) hours as needed.  Dispense: 120 tablet; Refill: 0 - cyclobenzaprine (FLEXERIL) 10 MG tablet; Take 1 tablet (10 mg total) by mouth 3 (three) times daily as needed for muscle spasms (typically scheduled at bedtime).  Dispense: 90 tablet; Refill: 5  2. Chronic nonseasonal allergic rhinitis due to  pollen - cetirizine (ZYRTEC) 10 MG tablet; Take 1 tablet (10 mg total) by mouth daily as needed (for allergies.).  Dispense: 90 tablet; Refill: 3  3. Congestive heart failure, unspecified HF chronicity, unspecified heart failure type ([place]) - Bayer DCA Hb A1c Waived - Microalbumin / creatinine urine ratio - CMP14+EGFR  4. Type 2 diabetes mellitus with stage 2 chronic kidney disease, without long-term current use of insulin ([facility]) - Bayer DCA Hb A1c Waived - Microalbumin / creatinine urine ratio - CMP14+EGFR   Continue all other maintenance medications as listed above.  Follow up plan: Return in about 3 months (around 01/29/2018) for recheck.  Educational handout given for Tyler Run PA-C [place] [address]  [place], [place] [address] [phone]   10/29/2017, 11:11 AM

## 2017-10-30 LAB — CMP14+EGFR
A/G RATIO: 1.3 (ref 1.2–2.2)
ALT: 7 IU/L (ref 0–44)
AST: 14 IU/L (ref 0–40)
Albumin: 3.8 g/dL (ref 3.6–4.8)
Alkaline Phosphatase: 116 IU/L (ref 39–117)
BILIRUBIN TOTAL: 0.3 mg/dL (ref 0.0–1.2)
BUN/Creatinine Ratio: 6 — ABNORMAL LOW (ref 10–24)
BUN: 10 mg/dL (ref 8–27)
CALCIUM: 9.1 mg/dL (ref 8.6–10.2)
CO2: 24 mmol/L (ref 20–29)
Chloride: 98 mmol/L (ref 96–106)
Creatinine, Ser: 1.78 mg/dL — ABNORMAL HIGH (ref 0.76–1.27)
GFR calc Af Amer: 45 mL/min/{1.73_m2} — ABNORMAL LOW (ref >59)
GFR calc non Af Amer: 39 mL/min/{1.73_m2} — ABNORMAL LOW (ref >59)
GLOBULIN, TOTAL: 3 g/dL (ref 1.5–4.5)
Glucose: 196 mg/dL — ABNORMAL HIGH (ref 65–99)
POTASSIUM: 4.2 mmol/L (ref 3.5–5.2)
Sodium: 142 mmol/L (ref 134–144)
Total Protein: 6.8 g/dL (ref 6.0–8.5)

## 2017-10-30 LAB — MICROALBUMIN / CREATININE URINE RATIO
CREATININE, UR: 42.2 mg/dL
MICROALB/CREAT RATIO: 103.1 mg/g{creat} — AB (ref 0.0–30.0)
MICROALBUM., U, RANDOM: 43.5 ug/mL

## 2017-11-05 DIAGNOSIS — Z79899 Other long term (current) drug therapy: Secondary | ICD-10-CM | POA: Diagnosis not present

## 2017-11-05 DIAGNOSIS — R809 Proteinuria, unspecified: Secondary | ICD-10-CM | POA: Diagnosis not present

## 2017-11-05 DIAGNOSIS — I129 Hypertensive chronic kidney disease with stage 1 through stage 4 chronic kidney disease, or unspecified chronic kidney disease: Secondary | ICD-10-CM | POA: Diagnosis not present

## 2017-11-05 DIAGNOSIS — D509 Iron deficiency anemia, unspecified: Secondary | ICD-10-CM | POA: Diagnosis not present

## 2017-11-05 DIAGNOSIS — E559 Vitamin D deficiency, unspecified: Secondary | ICD-10-CM | POA: Diagnosis not present

## 2017-11-05 DIAGNOSIS — N183 Chronic kidney disease, stage 3 (moderate): Secondary | ICD-10-CM | POA: Diagnosis not present

## 2017-11-07 ENCOUNTER — Other Ambulatory Visit: Payer: Self-pay | Admitting: Physician Assistant

## 2017-11-07 DIAGNOSIS — I5032 Chronic diastolic (congestive) heart failure: Secondary | ICD-10-CM | POA: Diagnosis not present

## 2017-11-07 DIAGNOSIS — N183 Chronic kidney disease, stage 3 (moderate): Secondary | ICD-10-CM | POA: Diagnosis not present

## 2017-11-07 DIAGNOSIS — D638 Anemia in other chronic diseases classified elsewhere: Secondary | ICD-10-CM | POA: Diagnosis not present

## 2017-11-23 ENCOUNTER — Encounter: Payer: Self-pay | Admitting: Cardiovascular Disease

## 2017-11-23 ENCOUNTER — Ambulatory Visit (INDEPENDENT_AMBULATORY_CARE_PROVIDER_SITE_OTHER): Payer: Medicare Other | Admitting: Cardiovascular Disease

## 2017-11-23 VITALS — BP 161/82 | HR 96 | Ht 66.0 in | Wt 202.8 lb

## 2017-11-23 DIAGNOSIS — I2510 Atherosclerotic heart disease of native coronary artery without angina pectoris: Secondary | ICD-10-CM | POA: Diagnosis not present

## 2017-11-23 DIAGNOSIS — N183 Chronic kidney disease, stage 3 unspecified: Secondary | ICD-10-CM

## 2017-11-23 DIAGNOSIS — I209 Angina pectoris, unspecified: Secondary | ICD-10-CM

## 2017-11-23 DIAGNOSIS — Z955 Presence of coronary angioplasty implant and graft: Secondary | ICD-10-CM | POA: Diagnosis not present

## 2017-11-23 DIAGNOSIS — E785 Hyperlipidemia, unspecified: Secondary | ICD-10-CM | POA: Diagnosis not present

## 2017-11-23 DIAGNOSIS — I10 Essential (primary) hypertension: Secondary | ICD-10-CM | POA: Diagnosis not present

## 2017-11-23 DIAGNOSIS — I5033 Acute on chronic diastolic (congestive) heart failure: Secondary | ICD-10-CM

## 2017-11-23 DIAGNOSIS — Z720 Tobacco use: Secondary | ICD-10-CM

## 2017-11-23 DIAGNOSIS — I739 Peripheral vascular disease, unspecified: Secondary | ICD-10-CM | POA: Diagnosis not present

## 2017-11-23 DIAGNOSIS — Z9861 Coronary angioplasty status: Secondary | ICD-10-CM

## 2017-11-23 MED ORDER — CARVEDILOL 25 MG PO TABS: 25.0000 mg | ORAL_TABLET | Freq: Two times a day (BID) | ORAL | 6 refills | Status: DC

## 2017-11-23 MED ORDER — ISOSORBIDE MONONITRATE ER 60 MG PO TB24: 60.0000 mg | ORAL_TABLET | Freq: Every day | ORAL | 6 refills | Status: DC

## 2017-11-23 MED ORDER — METOLAZONE 2.5 MG PO TABS: ORAL_TABLET | ORAL | 0 refills | Status: DC

## 2017-11-23 NOTE — Progress Notes (Signed)
SUBJECTIVE: The patient presents for follow-up of acute on chronic diastolic heart failure.  His weight remains stable at 202 pounds since his last visit with me at 10/10/2017.  I ordered a follow-up echocardiogram which was performed on 10/11/2017 and demonstrated normal left ventricular systolic function and regional wall motion, LVEF 60 to 65%, mild concentric and moderate basal septal hypertrophy, grade 1 diastolic dysfunction with indeterminate ventricular filling pressures.  Chest x-ray on 10/19/2017 showed mild bronchitic changes.  There was no evidence of CHF.  His nephrologist switch to Bumex to torsemide about 2 weeks ago.  BUN 10 and creatinine 1.78 on 10/29/2017.  He has had some retrosternal chest pains over the past 2 weeks associated with shortness of breath.  He has not taken any nitroglycerin.  His leg swelling has improved.  He also complains of bilateral groin pain.  Review of Systems: As per "subjective", otherwise negative.  Allergies  Allergen Reactions  . Atorvastatin     All statins make him cough  . Statins Cough    Pt is currently taking pravastatin     Current Outpatient Medications  Medication Sig Dispense Refill  . acetaminophen (TYLENOL) 325 MG tablet Take 2 tablets (650 mg total) by mouth every 4 (four) hours as needed for headache or mild pain.    [name] allopurinol (ZYLOPRIM) 100 MG tablet Take 1 tablet (100 mg total) 3 (three) times daily by mouth. 270 tablet 3  . ALPRAZolam (XANAX) 0.5 MG tablet TAKE ONE TABLET TWICE DAILY AS NEEDED 180 tablet 1  . aspirin EC 81 MG tablet Take 81 mg by mouth at bedtime.     . bumetanide (BUMEX) 0.5 MG tablet Take 3 tablets (1.5 mg total) by mouth 2 (two) times daily for 7 days. 90 tablet 0  . carvedilol (COREG) 25 MG tablet Take 0.5 tablets (12.5 mg total) by mouth 2 (two) times daily. 180 tablet 3  . cetirizine (ZYRTEC) 10 MG tablet Take 1 tablet (10 mg total) by mouth daily as needed (for allergies.). 90 tablet 3  .  clopidogrel (PLAVIX) 75 MG tablet Take 1 tablet (75 mg total) at bedtime by mouth. 90 tablet 3  . cyclobenzaprine (FLEXERIL) 10 MG tablet Take 1 tablet (10 mg total) by mouth 3 (three) times daily as needed for muscle spasms (typically scheduled at bedtime). 90 tablet 5  . hydrochlorothiazide (HYDRODIURIL) 25 MG tablet Take 1 tablet (25 mg total) by mouth daily. 90 tablet 3  . HYDROcodone-acetaminophen (NORCO) 10-325 MG tablet Take 1 tablet by mouth every 6 (six) hours as needed. 120 tablet 0  . isosorbide mononitrate (IMDUR) 30 MG 24 hr tablet Take 1 tablet (30 mg total) by mouth daily. 90 tablet 3  . liraglutide (VICTOZA) 18 MG/3ML SOPN Inject 0.3 mLs (1.8 mg total) into the skin daily. 27 mL 13  . LYRICA 200 MG capsule Take 1 capsule (200 mg total) by mouth 2 (two) times daily as needed (for pain.).    [name] pantoprazole (PROTONIX) 40 MG tablet Take 1 tablet (40 mg total) by mouth daily at 6 (six) AM. 30 tablet 5  . pravastatin (PRAVACHOL) 80 MG tablet Take 1 tablet (80 mg total) by mouth every evening. 90 tablet 1  . torsemide (DEMADEX) 20 MG tablet TAKE ONE TABLET BY MOUTH TWICE DAILY 180 tablet 0  . ULTICARE MICRO PEN NEEDLES 32G X 4 MM MISC EVERY DAY 100 each 3  . VENTOLIN HFA 108 (90 Base) MCG/ACT inhaler Inhale 2 puffs  into the lungs every 6 (six) hours as needed for wheezing or shortness of breath. 18 g 6  . nitroGLYCERIN (NITROSTAT) 0.4 MG SL tablet Place 1 tablet (0.4 mg total) under the tongue every 5 (five) minutes as needed for chest pain. 25 tablet 3   No current facility-administered medications for this visit.     Past Medical History:  Diagnosis Date  . Anxiety   . Arthritis    "qwhere" (12/20/2016)  . Chronic lower back pain   . CKD (chronic kidney disease), stage IV ([place])   . Complication of anesthesia    "got the wrong kind of anesthesia ~ 2000 when they were looking down into my stomach"  . Coronary artery disease   . Gout   . Heart disease   . Hyperlipidemia   .  Hypertension   . Pneumonia ~ 2015  . Seasonal allergies   . Type II diabetes mellitus ([place])     Past Surgical History:  Procedure Laterality Date  . CARDIAC CATHETERIZATION  12/20/2016  . CORONARY ANGIOPLASTY WITH STENT PLACEMENT  2004 X 2   "1st stent moved"  . CORONARY STENT INTERVENTION N/A 12/21/2016   Procedure: CORONARY STENT INTERVENTION;  Surgeon: Burnell Blanks, MD;  Location: [facility];  Service: Cardiovascular;  Laterality: N/A;  . ESOPHAGOGASTRODUODENOSCOPY  ~ 2000  . LAPAROSCOPIC CHOLECYSTECTOMY    . LEFT HEART CATH AND CORONARY ANGIOGRAPHY N/A 12/20/2016   Procedure: LEFT HEART CATH AND CORONARY ANGIOGRAPHY;  Surgeon: Burnell Blanks, MD;  Location: [facility];  Service: Cardiovascular;  Laterality: N/A;    Social History   Socioeconomic History  . Marital status: Widowed    Spouse name: Not on file  . Number of children: 7  . Years of education: Not on file  . Highest education level: Not on file  Occupational History  . Occupation: retired     Comment: Designer, television/film set and Manufacturing engineer co.  Social Needs  . Financial resource strain: Not on file  . Food insecurity:    Worry: Not on file    Inability: Not on file  . Transportation needs:    Medical: Not on file    Non-medical: Not on file  Tobacco Use  . Smoking status: Current Every Day Smoker    Packs/day: 0.50    Years: 44.00    Pack years: 22.00    Types: Cigarettes  . Smokeless tobacco: Never Used  Substance and Sexual Activity  . Alcohol use: No  . Drug use: No  . Sexual activity: Not Currently  Lifestyle  . Physical activity:    Days per week: Not on file    Minutes per session: Not on file  . Stress: Not on file  Relationships  . Social connections:    Talks on phone: Not on file    Gets together: Not on file    Attends religious service: Not on file    Active member of club or organization: Not on file    Attends meetings of clubs or organizations:  Not on file    Relationship status: Not on file  . Intimate partner violence:    Fear of current or ex partner: Not on file    Emotionally abused: Not on file    Physically abused: Not on file    Forced sexual activity: Not on file  Other Topics Concern  . Not on file  Social History Narrative  . Not on file  Vitals:   11/23/17 0937  BP: (!) 161/82  Pulse: 96  Weight: 202 lb 12.8 oz (92 kg)  Height: 5\' 6"  (1.676 m)    Wt Readings from Last 3 Encounters:  11/23/17 202 lb 12.8 oz (92 kg)  10/29/17 204 lb 9.6 oz (92.8 kg)  10/10/17 202 lb 3.2 oz (91.7 kg)     PHYSICAL EXAM General: NAD HEENT: Normal. Neck: No JVD, no thyromegaly. Lungs: Diminished sounds throughout, no obvious crackles or wheezes. CV: Regular rate and rhythm, normal S1/S2, no S3/S4, no murmur.  Trace bilateral lower extremity edema. .   Abdomen: Soft, nontender, no distention.  Neurologic: Alert and oriented.  Psych: Normal affect. Skin: Normal. Musculoskeletal: No gross deformities.    ECG: Reviewed above under Subjective   Labs: Lab Results  Component Value Date/Time   K 4.2 10/29/2017 10:12 AM   BUN 10 10/29/2017 10:12 AM   CREATININE 1.78 (H) 10/29/2017 10:12 AM   ALT 7 10/29/2017 10:12 AM   TSH 1.730 07/27/2017 12:32 PM   HGB 12.4 (L) 09/24/2017 05:12 PM     Lipids: Lab Results  Component Value Date/Time   LDLCALC 92 07/27/2017 12:32 PM   CHOL 153 07/27/2017 12:32 PM   TRIG 180 (H) 07/27/2017 12:32 PM   HDL 25 (L) 07/27/2017 12:32 PM       ASSESSMENT AND PLAN:  1.  Acute on chronic diastolic heart failure: Weight was 186 pounds when he saw me on 01/11/2017.  He now weighs 202 pounds which is the same as when he saw me on 10/10/2017.  His nephrologist switched to Bumex to torsemide 20 mg twice daily which he has been on for about 2 weeks. He is also on carvedilol 12.5 mg twice daily, hydrochlorothiazide 25 mg daily, and Imdur 30 mg.   I will add metolazone 2.5 mg daily for 3  days and check a basic metabolic panel within the next several days to reassess renal function.  I will increase Imdur to 60 mg.  I will increase carvedilol to 25 mg twice daily. BNP normal at 37.8 on 09/24/2017.  BUN and creatinine reviewed above.    2.  Coronary artery disease with angina: Currently on aspirin, carvedilol, Plavix, Imdur 30 mg, and pravastatin 40 mg.  I will increase Imdur to 60 mg daily and carvedilol to 25 mg twice daily.  I maycontinue dual antiplatelet therapy indefinitely.  3.  Hyperlipidemia: LDL 92 on 07/27/2017.  I previously increased pravastatin to 80 mg in July 2019.  4.  Chronic kidney disease stage III: BUN and creatinine reviewed above.  This will need continued monitoring.  I will check a basic metabolic panel given the addition of metolazone.  5.  Peripheral vascular disease with claudication: On aspirin and statin therapy.  He complains of bilateral groin pain today.  He has a long history of tobacco abuse. ABIs showed aortoiliac atherosclerosis with greater than 50% stenosis of the bilateral external iliac arteries, left greater than right, 50-74% stenosis of the mid and distal right SFA, 30-49% stenosis of the left mid SFA, and 50-74% stenosis of the left distal SFA. There was two-vessel runoff bilaterally with both posterior tibial arteries being occluded. He needs tobacco cessation. He should follow up with Dr. Gwenlyn Found as previously planned.  He said it is too difficult for him to drive to [place].  I again encouraged him to do so  6.  Hypertension: Blood pressure is elevated.  I will increase carvedilol to 25 mg twice daily.  Disposition: Follow up 4 months.   Kate Sable, M.D., F.A.C.C.

## 2017-11-23 NOTE — Patient Instructions (Signed)
Medication Instructions:   Increase Coreg to 25mg  twice a day.  Increase Imdur to 60mg  daily.  Begin Metolazone 2.5mg  daily x 3 days only.   Continue all other medications.    Labwork:  BMET - order given today.   Do on Tuesday, 11/27/2017.  Office will contact with results via phone or letter.    Testing/Procedures: none  Follow-Up: 4 months   Any Other Special Instructions Will Be Listed Below (If Applicable).  If you need a refill on your cardiac medications before your next appointment, please call your pharmacy.

## 2017-11-27 ENCOUNTER — Other Ambulatory Visit: Payer: Medicare Other

## 2017-11-27 DIAGNOSIS — I10 Essential (primary) hypertension: Secondary | ICD-10-CM | POA: Diagnosis not present

## 2017-11-28 LAB — BASIC METABOLIC PANEL
BUN/Creatinine Ratio: 6 — ABNORMAL LOW (ref 10–24)
BUN: 12 mg/dL (ref 8–27)
CALCIUM: 9.1 mg/dL (ref 8.6–10.2)
CO2: 27 mmol/L (ref 20–29)
Chloride: 91 mmol/L — ABNORMAL LOW (ref 96–106)
Creatinine, Ser: 1.99 mg/dL — ABNORMAL HIGH (ref 0.76–1.27)
GFR, EST AFRICAN AMERICAN: 39 mL/min/{1.73_m2} — AB (ref >59)
GFR, EST NON AFRICAN AMERICAN: 34 mL/min/{1.73_m2} — AB (ref >59)
Glucose: 215 mg/dL — ABNORMAL HIGH (ref 65–99)
POTASSIUM: 3.5 mmol/L (ref 3.5–5.2)
Sodium: 136 mmol/L (ref 134–144)

## 2017-12-04 ENCOUNTER — Telehealth: Payer: Self-pay | Admitting: *Deleted

## 2017-12-04 NOTE — Telephone Encounter (Signed)
Notes recorded by Laurine Blazer, LPN on 4/69/6295 at 2:84 AM EDT Patient notified. Copy to pmd. Follow up scheduled for December with Dr. Bronson Ing. Copy to Dr. Lowanda Foster as well.   ------  Notes recorded by Arnoldo Lenis, MD on 11/30/2017 at 12:37 PM EDT Labs look ok. Renal function remains decreased, overall remains stable from prior levels  Zandra Abts MD

## 2017-12-24 DIAGNOSIS — N183 Chronic kidney disease, stage 3 (moderate): Secondary | ICD-10-CM | POA: Diagnosis not present

## 2017-12-24 DIAGNOSIS — I129 Hypertensive chronic kidney disease with stage 1 through stage 4 chronic kidney disease, or unspecified chronic kidney disease: Secondary | ICD-10-CM | POA: Diagnosis not present

## 2017-12-24 DIAGNOSIS — R809 Proteinuria, unspecified: Secondary | ICD-10-CM | POA: Diagnosis not present

## 2017-12-24 DIAGNOSIS — E559 Vitamin D deficiency, unspecified: Secondary | ICD-10-CM | POA: Diagnosis not present

## 2017-12-24 DIAGNOSIS — Z79899 Other long term (current) drug therapy: Secondary | ICD-10-CM | POA: Diagnosis not present

## 2017-12-24 DIAGNOSIS — D509 Iron deficiency anemia, unspecified: Secondary | ICD-10-CM | POA: Diagnosis not present

## 2017-12-26 DIAGNOSIS — R809 Proteinuria, unspecified: Secondary | ICD-10-CM | POA: Diagnosis not present

## 2017-12-26 DIAGNOSIS — N183 Chronic kidney disease, stage 3 (moderate): Secondary | ICD-10-CM | POA: Diagnosis not present

## 2017-12-26 DIAGNOSIS — M25559 Pain in unspecified hip: Secondary | ICD-10-CM | POA: Insufficient documentation

## 2017-12-26 DIAGNOSIS — I509 Heart failure, unspecified: Secondary | ICD-10-CM | POA: Diagnosis not present

## 2018-01-29 ENCOUNTER — Encounter: Payer: Self-pay | Admitting: Physician Assistant

## 2018-01-29 ENCOUNTER — Ambulatory Visit (INDEPENDENT_AMBULATORY_CARE_PROVIDER_SITE_OTHER): Payer: Medicare Other | Admitting: Physician Assistant

## 2018-01-29 VITALS — BP 156/84 | HR 112 | Ht 66.0 in | Wt 195.2 lb

## 2018-01-29 DIAGNOSIS — M5136 Other intervertebral disc degeneration, lumbar region: Secondary | ICD-10-CM | POA: Diagnosis not present

## 2018-01-29 DIAGNOSIS — E1122 Type 2 diabetes mellitus with diabetic chronic kidney disease: Secondary | ICD-10-CM | POA: Diagnosis not present

## 2018-01-29 DIAGNOSIS — N182 Chronic kidney disease, stage 2 (mild): Secondary | ICD-10-CM | POA: Diagnosis not present

## 2018-01-29 DIAGNOSIS — M1A9XX0 Chronic gout, unspecified, without tophus (tophi): Secondary | ICD-10-CM

## 2018-01-29 DIAGNOSIS — N183 Chronic kidney disease, stage 3 unspecified: Secondary | ICD-10-CM

## 2018-01-29 DIAGNOSIS — Z23 Encounter for immunization: Secondary | ICD-10-CM

## 2018-01-29 LAB — BAYER DCA HB A1C WAIVED: HB A1C: 6.9 % (ref <7.0)

## 2018-01-29 MED ORDER — HYDROCODONE-ACETAMINOPHEN 10-325 MG PO TABS: 1.0000 | ORAL_TABLET | Freq: Four times a day (QID) | ORAL | 0 refills | Status: DC | PRN

## 2018-01-29 MED ORDER — ALPRAZOLAM 0.5 MG PO TABS: ORAL_TABLET | ORAL | 1 refills | Status: DC

## 2018-01-29 MED ORDER — PANTOPRAZOLE SODIUM 40 MG PO TBEC: 40.0000 mg | DELAYED_RELEASE_TABLET | Freq: Every day | ORAL | 5 refills | Status: DC

## 2018-01-29 MED ORDER — TORSEMIDE 20 MG PO TABS: 20.0000 mg | ORAL_TABLET | Freq: Two times a day (BID) | ORAL | 1 refills | Status: DC

## 2018-01-29 MED ORDER — CLOPIDOGREL BISULFATE 75 MG PO TABS: 75.0000 mg | ORAL_TABLET | Freq: Every day | ORAL | 3 refills | Status: DC

## 2018-01-29 MED ORDER — PRAVASTATIN SODIUM 80 MG PO TABS: 80.0000 mg | ORAL_TABLET | Freq: Every evening | ORAL | 1 refills | Status: DC

## 2018-01-29 MED ORDER — ALLOPURINOL 100 MG PO TABS: 100.0000 mg | ORAL_TABLET | Freq: Three times a day (TID) | ORAL | 3 refills | Status: DC

## 2018-01-30 LAB — LIPID PANEL
CHOL/HDL RATIO: 5 ratio (ref 0.0–5.0)
Cholesterol, Total: 134 mg/dL (ref 100–199)
HDL: 27 mg/dL — ABNORMAL LOW (ref >39)
LDL Calculated: 76 mg/dL (ref 0–99)
Triglycerides: 156 mg/dL — ABNORMAL HIGH (ref 0–149)
VLDL Cholesterol Cal: 31 mg/dL (ref 5–40)

## 2018-01-30 LAB — CBC WITH DIFFERENTIAL/PLATELET
Basophils Absolute: 0.1 10*3/uL (ref 0.0–0.2)
Basos: 1 %
EOS (ABSOLUTE): 0.4 10*3/uL (ref 0.0–0.4)
Eos: 5 %
Hematocrit: 39.2 % (ref 37.5–51.0)
Hemoglobin: 12.4 g/dL — ABNORMAL LOW (ref 13.0–17.7)
Immature Grans (Abs): 0 10*3/uL (ref 0.0–0.1)
Immature Granulocytes: 0 %
LYMPHS ABS: 2.3 10*3/uL (ref 0.7–3.1)
Lymphs: 29 %
MCH: 26.4 pg — AB (ref 26.6–33.0)
MCHC: 31.6 g/dL (ref 31.5–35.7)
MCV: 84 fL (ref 79–97)
MONOS ABS: 0.6 10*3/uL (ref 0.1–0.9)
Monocytes: 7 %
NEUTROS PCT: 58 %
Neutrophils Absolute: 4.6 10*3/uL (ref 1.4–7.0)
Platelets: 318 10*3/uL (ref 150–450)
RBC: 4.69 x10E6/uL (ref 4.14–5.80)
RDW: 14.4 % (ref 12.3–15.4)
WBC: 7.9 10*3/uL (ref 3.4–10.8)

## 2018-01-30 LAB — CMP14+EGFR
A/G RATIO: 1.2 (ref 1.2–2.2)
ALBUMIN: 3.7 g/dL (ref 3.6–4.8)
ALK PHOS: 117 IU/L (ref 39–117)
ALT: 14 IU/L (ref 0–44)
AST: 23 IU/L (ref 0–40)
BUN / CREAT RATIO: 5 — AB (ref 10–24)
BUN: 7 mg/dL — ABNORMAL LOW (ref 8–27)
Bilirubin Total: 0.5 mg/dL (ref 0.0–1.2)
CALCIUM: 9.3 mg/dL (ref 8.6–10.2)
CO2: 24 mmol/L (ref 20–29)
CREATININE: 1.4 mg/dL — AB (ref 0.76–1.27)
Chloride: 97 mmol/L (ref 96–106)
GFR calc Af Amer: 60 mL/min/{1.73_m2} (ref >59)
GFR, EST NON AFRICAN AMERICAN: 52 mL/min/{1.73_m2} — AB (ref >59)
GLOBULIN, TOTAL: 3.2 g/dL (ref 1.5–4.5)
Glucose: 235 mg/dL — ABNORMAL HIGH (ref 65–99)
POTASSIUM: 3.5 mmol/L (ref 3.5–5.2)
SODIUM: 138 mmol/L (ref 134–144)
Total Protein: 6.9 g/dL (ref 6.0–8.5)

## 2018-01-30 NOTE — Progress Notes (Signed)
BP (!) 156/84   Pulse (!) 112   Ht _0  (1.676 m)   Wt 195 lb 3.2 oz (88.5 kg)   BMI 31.51 kg/m    Subjective:    Patient ID: Tommy Douglas, male    DOB: 07-09-1950, 67 y.o.   MRN: [id number]  HPI: Tommy Douglas is a 67 y.o. male presenting on 01/29/2018 for Diabetes (3 month) and Hypertension  This patient comes in for periodic recheck on medications and conditions including gout. Type 2 diabetes, CKD, DDD.  He is stable overall and seeing his specialists. NEW narcotic contract is reviewed and signed at today's visit..   All medications are reviewed today. There are no reports of any problems with the medications. All of the medical conditions are reviewed and updated.  Lab work is reviewed and will be ordered as medically necessary. There are no new problems reported with today's visit.   Past Medical History:  Diagnosis Date  . Anxiety   . Arthritis    "qwhere" (12/20/2016)  . Chronic lower back pain   . CKD (chronic kidney disease), stage IV ([place])   . Complication of anesthesia    "got the wrong kind of anesthesia ~ 2000 when they were looking down into my stomach"  . Coronary artery disease   . Gout   . Heart disease   . Hyperlipidemia   . Hypertension   . Pneumonia ~ 2015  . Seasonal allergies   . Type II diabetes mellitus (H[facility])    Relevant past medical, surgical, family and social history reviewed and updated as indicated. Interim medical history since our last visit reviewed. Allergies and medications reviewed and updated. DATA REVIEWED: CHART IN EPIC  Family History reviewed for pertinent findings.  Review of Systems  Constitutional: Negative.  Negative for appetite change and fatigue.  Eyes: Negative for pain and visual disturbance.  Respiratory: Negative.  Negative for cough, chest tightness, shortness of breath and wheezing.   Cardiovascular: Negative.  Negative for chest pain, palpitations and leg swelling.  Gastrointestinal: Negative.  Negative for  abdominal pain, diarrhea, nausea and vomiting.  Genitourinary: Negative.   Skin: Negative.  Negative for color change and rash.  Neurological: Negative.  Negative for weakness, numbness and headaches.  Psychiatric/Behavioral: Negative.     Allergies as of 01/29/2018      Reactions   Atorvastatin    All statins make him cough   Statins Cough   Pt is currently taking pravastatin       Medication List        Accurate as of 01/29/18 11:59 PM. Always use your most recent med list.          acetaminophen 325 MG tablet Commonly known as:  TYLENOL Take 2 tablets (650 mg total) by mouth every 4 (four) hours as needed for headache or mild pain.   allopurinol 100 MG tablet Commonly known as:  ZYLOPRIM Take 1 tablet (100 mg total) by mouth 3 (three) times daily.   ALPRAZolam 0.5 MG tablet Commonly known as:  XANAX TAKE ONE TABLET TWICE DAILY AS NEEDED   aspirin EC 81 MG tablet Take 81 mg by mouth at bedtime.   bumetanide 0.5 MG tablet Commonly known as:  BUMEX Take 3 tablets (1.5 mg total) by mouth 2 (two) times daily for 7 days.   cetirizine 10 MG tablet Commonly known as:  ZYRTEC Take 1 tablet (10 mg total) by mouth daily as needed (for allergies.).   clopidogrel 75  MG tablet Commonly known as:  PLAVIX Take 1 tablet (75 mg total) by mouth at bedtime.   cyclobenzaprine 10 MG tablet Commonly known as:  FLEXERIL Take 1 tablet (10 mg total) by mouth 3 (three) times daily as needed for muscle spasms (typically scheduled at bedtime).   hydrochlorothiazide 25 MG tablet Commonly known as:  HYDRODIURIL Take 1 tablet (25 mg total) by mouth daily.   HYDROcodone-acetaminophen 10-325 MG tablet Commonly known as:  NORCO Take 1 tablet by mouth every 6 (six) hours as needed.   HYDROcodone-acetaminophen 10-325 MG tablet Commonly known as:  NORCO Take 1 tablet by mouth every 6 (six) hours as needed.   HYDROcodone-acetaminophen 10-325 MG tablet Commonly known as:  NORCO Take 1  tablet by mouth every 6 (six) hours as needed.   isosorbide mononitrate 60 MG 24 hr tablet Commonly known as:  IMDUR Take 1 tablet (60 mg total) by mouth daily.   liraglutide 18 MG/3ML Sopn Commonly known as:  VICTOZA Inject 0.3 mLs (1.8 mg total) into the skin daily.   lisinopril 5 MG tablet Commonly known as:  PRINIVIL,ZESTRIL Take 5 mg by mouth 1 day or 1 dose.   LYRICA 200 MG capsule Generic drug:  pregabalin Take 1 capsule (200 mg total) by mouth 2 (two) times daily as needed (for pain.).   nitroGLYCERIN 0.4 MG SL tablet Commonly known as:  NITROSTAT Place 1 tablet (0.4 mg total) under the tongue every 5 (five) minutes as needed for chest pain.   pantoprazole 40 MG tablet Commonly known as:  PROTONIX Take 1 tablet (40 mg total) by mouth daily at 6 (six) AM.   pravastatin 80 MG tablet Commonly known as:  PRAVACHOL Take 1 tablet (80 mg total) by mouth every evening.   torsemide 20 MG tablet Commonly known as:  DEMADEX Take 1 tablet (20 mg total) by mouth 2 (two) times daily.   ULTICARE MICRO PEN NEEDLES 32G X 4 MM Misc Generic drug:  Insulin Pen Needle EVERY DAY   VENTOLIN HFA 108 (90 Base) MCG/ACT inhaler Generic drug:  albuterol Inhale 2 puffs into the lungs every 6 (six) hours as needed for wheezing or shortness of breath.          Objective:    BP (!) 156/84   Pulse (!) 112   Ht _0  (1.676 m)   Wt 195 lb 3.2 oz (88.5 kg)   BMI 31.51 kg/m   Allergies  Allergen Reactions  . Atorvastatin     All statins make him cough  . Statins Cough    Pt is currently taking pravastatin     Wt Readings from Last 3 Encounters:  01/29/18 195 lb 3.2 oz (88.5 kg)  11/23/17 202 lb 12.8 oz (92 kg)  10/29/17 204 lb 9.6 oz (92.8 kg)    Physical Exam  Constitutional: He appears well-developed and well-nourished. No distress.  HENT:  Head: Normocephalic and atraumatic.  Eyes: Pupils are equal, round, and reactive to light. Conjunctivae and EOM are normal.    Cardiovascular: Normal rate, regular rhythm and normal heart sounds.  Pulmonary/Chest: Effort normal and breath sounds normal. No respiratory distress.  Skin: Skin is warm and dry.  Psychiatric: He has a normal mood and affect. His behavior is normal.  Nursing note and vitals reviewed.   Results for orders placed or performed in visit on 01/29/18  CMP14+EGFR  Result Value Ref Range   Glucose 235 (H) 65 - 99 mg/dL   BUN 7 (L) 8 -  27 mg/dL   Creatinine, Ser 1.40 (H) 0.76 - 1.27 mg/dL   GFR calc non Af Amer 52 (L) >59 mL/min/1.73   GFR calc Af Amer 60 >59 mL/min/1.73   BUN/Creatinine Ratio 5 (L) 10 - 24   Sodium 138 134 - 144 mmol/L   Potassium 3.5 3.5 - 5.2 mmol/L   Chloride 97 96 - 106 mmol/L   CO2 24 20 - 29 mmol/L   Calcium 9.3 8.6 - 10.2 mg/dL   Total Protein 6.9 6.0 - 8.5 g/dL   Albumin 3.7 3.6 - 4.8 g/dL   Globulin, Total 3.2 1.5 - 4.5 g/dL   Albumin/Globulin Ratio 1.2 1.2 - 2.2   Bilirubin Total 0.5 0.0 - 1.2 mg/dL   Alkaline Phosphatase 117 39 - 117 IU/L   AST 23 0 - 40 IU/L   ALT 14 0 - 44 IU/L  CBC with Differential/Platelet  Result Value Ref Range   WBC 7.9 3.4 - 10.8 x10E3/uL   RBC 4.69 4.14 - 5.80 x10E6/uL   Hemoglobin 12.4 (L) 13.0 - 17.7 g/dL   Hematocrit 39.2 37.5 - 51.0 %   MCV 84 79 - 97 fL   MCH 26.4 (L) 26.6 - 33.0 pg   MCHC 31.6 31.5 - 35.7 g/dL   RDW 14.4 12.3 - 15.4 %   Platelets 318 150 - 450 x10E3/uL   Neutrophils 58 Not Estab. %   Lymphs 29 Not Estab. %   Monocytes 7 Not Estab. %   Eos 5 Not Estab. %   Basos 1 Not Estab. %   Neutrophils Absolute 4.6 1.4 - 7.0 x10E3/uL   Lymphocytes Absolute 2.3 0.7 - 3.1 x10E3/uL   Monocytes Absolute 0.6 0.1 - 0.9 x10E3/uL   EOS (ABSOLUTE) 0.4 0.0 - 0.4 x10E3/uL   Basophils Absolute 0.1 0.0 - 0.2 x10E3/uL   Immature Granulocytes 0 Not Estab. %   Immature Grans (Abs) 0.0 0.0 - 0.1 x10E3/uL  Lipid panel  Result Value Ref Range   Cholesterol, Total 134 100 - 199 mg/dL   Triglycerides 156 (H) 0 - 149 mg/dL    HDL 27 (L) >39 mg/dL   VLDL Cholesterol Cal 31 5 - 40 mg/dL   LDL Calculated 76 0 - 99 mg/dL   Chol/HDL Ratio 5.0 0.0 - 5.0 ratio  Bayer DCA Hb A1c Waived  Result Value Ref Range   HB A1C (BAYER DCA - WAIVED) 6.9 <7.0 %      Assessment & Plan:   1. Chronic gout without tophus, unspecified cause, unspecified site - allopurinol (ZYLOPRIM) 100 MG tablet; Take 1 tablet (100 mg total) by mouth 3 (three) times daily.  Dispense: 270 tablet; Refill: 3  2. Type 2 diabetes mellitus with stage 2 chronic kidney disease, without long-term current use of insulin (HCC) - CMP14+EGFR - CBC with Differential/Platelet - Lipid panel - Microalbumin / creatinine urine ratio - Bayer DCA Hb A1c Waived  3. Stage 3 chronic kidney disease ([facility]CC) - CMP14+EGFR - CBC with Differential/Platelet - Lipid panel - Microalbumin / creatinine urine ratio - Bayer DCA Hb A1c Waived  4. Encounter for immunization - Flu vaccine HIGH DOSE PF  5. DDD (degenerative disc disease), lumbar - HYDROcodone-acetaminophen (NORCO) 10-325 MG tablet; Take 1 tablet by mouth every 6 (six) hours as needed.  Dispense: 120 tablet; Refill: 0 - HYDROcodone-acetaminophen (NORCO) 10-325 MG tablet; Take 1 tablet by mouth every 6 (six) hours as needed.  Dispense: 120 tablet; Refill: 0 - HYDROcodone-acetaminophen (NORCO) 10-325 MG tablet; Take 1 tablet by  mouth every 6 (six) hours as needed.  Dispense: 120 tablet; Refill: 0   Continue all other maintenance medications as listed above.  Follow up plan: Return in about 3 months (around 05/01/2018) for recheck.  Educational handout given for [place] PA-C [facility] [address]  [place], [place] [address] [phone]   01/30/2018, 10:12 PM

## 2018-03-25 ENCOUNTER — Encounter: Payer: Self-pay | Admitting: Cardiovascular Disease

## 2018-03-25 ENCOUNTER — Other Ambulatory Visit: Payer: Self-pay | Admitting: Physician Assistant

## 2018-03-25 ENCOUNTER — Ambulatory Visit (INDEPENDENT_AMBULATORY_CARE_PROVIDER_SITE_OTHER): Payer: Medicare Other | Admitting: Cardiovascular Disease

## 2018-03-25 VITALS — BP 150/74 | HR 93 | Ht 69.0 in | Wt 188.0 lb

## 2018-03-25 DIAGNOSIS — E785 Hyperlipidemia, unspecified: Secondary | ICD-10-CM | POA: Diagnosis not present

## 2018-03-25 DIAGNOSIS — I25118 Atherosclerotic heart disease of native coronary artery with other forms of angina pectoris: Secondary | ICD-10-CM

## 2018-03-25 DIAGNOSIS — M5136 Other intervertebral disc degeneration, lumbar region: Secondary | ICD-10-CM

## 2018-03-25 DIAGNOSIS — Z9861 Coronary angioplasty status: Secondary | ICD-10-CM

## 2018-03-25 DIAGNOSIS — I10 Essential (primary) hypertension: Secondary | ICD-10-CM

## 2018-03-25 DIAGNOSIS — I2510 Atherosclerotic heart disease of native coronary artery without angina pectoris: Secondary | ICD-10-CM

## 2018-03-25 DIAGNOSIS — N183 Chronic kidney disease, stage 3 unspecified: Secondary | ICD-10-CM

## 2018-03-25 DIAGNOSIS — I5032 Chronic diastolic (congestive) heart failure: Secondary | ICD-10-CM

## 2018-03-25 DIAGNOSIS — I739 Peripheral vascular disease, unspecified: Secondary | ICD-10-CM | POA: Diagnosis not present

## 2018-03-25 MED ORDER — CARVEDILOL 25 MG PO TABS: 25.0000 mg | ORAL_TABLET | Freq: Two times a day (BID) | ORAL | 3 refills | Status: DC

## 2018-03-25 NOTE — Progress Notes (Addendum)
SUBJECTIVE: The patient presents for routine follow-up.  He has coronary artery disease and chronic diastolic heart failure.  He is presently feeling well and denies chest pain, palpitations, and shortness of breath.  Someone stopped his Coreg and his blood pressure became significantly elevated with systolic readings in the 509 range and he developed chest tightness and pressure.  He resumed Coreg on his own and has felt better ever since.  ECG performed in the office today which I ordered and personally interpreted demonstrates normal sinus rhythm with nonspecific T-wave abnormalities.    Review of Systems: As per "subjective", otherwise negative.  Allergies  Allergen Reactions  . Atorvastatin     All statins make him cough  . Statins Cough    Pt is currently taking pravastatin     Current Outpatient Medications  Medication Sig Dispense Refill  . acetaminophen (TYLENOL) 325 MG tablet Take 2 tablets (650 mg total) by mouth every 4 (four) hours as needed for headache or mild pain.    [name] allopurinol (ZYLOPRIM) 100 MG tablet Take 1 tablet (100 mg total) by mouth 3 (three) times daily. 270 tablet 3  . ALPRAZolam (XANAX) 0.5 MG tablet TAKE ONE TABLET TWICE DAILY AS NEEDED 180 tablet 1  . aspirin EC 81 MG tablet Take 81 mg by mouth at bedtime.     . cetirizine (ZYRTEC) 10 MG tablet Take 1 tablet (10 mg total) by mouth daily as needed (for allergies.). 90 tablet 3  . clopidogrel (PLAVIX) 75 MG tablet Take 1 tablet (75 mg total) by mouth at bedtime. 90 tablet 3  . cyclobenzaprine (FLEXERIL) 10 MG tablet Take 1 tablet (10 mg total) by mouth 3 (three) times daily as needed for muscle spasms (typically scheduled at bedtime). 90 tablet 5  . hydrochlorothiazide (HYDRODIURIL) 25 MG tablet Take 1 tablet (25 mg total) by mouth daily. 90 tablet 3  . HYDROcodone-acetaminophen (NORCO) 10-325 MG tablet Take 1 tablet by mouth every 6 (six) hours as needed. 120 tablet 0  .  HYDROcodone-acetaminophen (NORCO) 10-325 MG tablet Take 1 tablet by mouth every 6 (six) hours as needed. 120 tablet 0  . HYDROcodone-acetaminophen (NORCO) 10-325 MG tablet Take 1 tablet by mouth every 6 (six) hours as needed. 120 tablet 0  . isosorbide mononitrate (IMDUR) 60 MG 24 hr tablet Take 1 tablet (60 mg total) by mouth daily. 30 tablet 6  . liraglutide (VICTOZA) 18 MG/3ML SOPN Inject 0.3 mLs (1.8 mg total) into the skin daily. 27 mL 13  . lisinopril (PRINIVIL,ZESTRIL) 5 MG tablet Take 5 mg by mouth 1 day or 1 dose.    [name] LYRICA 200 MG capsule TAKE ONE CAPSULE BY MOUTH TWICE A DAY 60 capsule 0  . pantoprazole (PROTONIX) 40 MG tablet Take 1 tablet (40 mg total) by mouth daily at 6 (six) AM. 30 tablet 5  . pravastatin (PRAVACHOL) 80 MG tablet Take 1 tablet (80 mg total) by mouth every evening. 90 tablet 1  . torsemide (DEMADEX) 20 MG tablet Take 1 tablet (20 mg total) by mouth 2 (two) times daily. 180 tablet 1  . ULTICARE MICRO PEN NEEDLES 32G X 4 MM MISC EVERY DAY 100 each 3  . VENTOLIN HFA 108 (90 Base) MCG/ACT inhaler Inhale 2 puffs into the lungs every 6 (six) hours as needed for wheezing or shortness of breath. 18 g 6  . bumetanide (BUMEX) 0.5 MG tablet Take 3 tablets (1.5 mg total) by mouth 2 (two) times daily for 7  days. 90 tablet 0  . nitroGLYCERIN (NITROSTAT) 0.4 MG SL tablet Place 1 tablet (0.4 mg total) under the tongue every 5 (five) minutes as needed for chest pain. 25 tablet 3   No current facility-administered medications for this visit.     Past Medical History:  Diagnosis Date  . Anxiety   . Arthritis    "qwhere" (12/20/2016)  . Chronic lower back pain   . CKD (chronic kidney disease), stage IV ([place])   . Complication of anesthesia    "got the wrong kind of anesthesia ~ 2000 when they were looking down into my stomach"  . Coronary artery disease   . Gout   . Heart disease   . Hyperlipidemia   . Hypertension   . Pneumonia ~ 2015  . Seasonal allergies   . Type II  diabetes mellitus ([place])     Past Surgical History:  Procedure Laterality Date  . CARDIAC CATHETERIZATION  12/20/2016  . CORONARY ANGIOPLASTY WITH STENT PLACEMENT  2004 X 2   "1st stent moved"  . CORONARY STENT INTERVENTION N/A 12/21/2016   Procedure: CORONARY STENT INTERVENTION;  Surgeon: Burnell Blanks, MD;  Location: [facility];  Service: Cardiovascular;  Laterality: N/A;  . ESOPHAGOGASTRODUODENOSCOPY  ~ 2000  . LAPAROSCOPIC CHOLECYSTECTOMY    . LEFT HEART CATH AND CORONARY ANGIOGRAPHY N/A 12/20/2016   Procedure: LEFT HEART CATH AND CORONARY ANGIOGRAPHY;  Surgeon: Burnell Blanks, MD;  Location: [facility];  Service: Cardiovascular;  Laterality: N/A;    Social History   Socioeconomic History  . Marital status: Widowed    Spouse name: Not on file  . Number of children: 7  . Years of education: Not on file  . Highest education level: Not on file  Occupational History  . Occupation: retired     Comment: Designer, television/film set and Manufacturing engineer co.  Social Needs  . Financial resource strain: Not on file  . Food insecurity:    Worry: Not on file    Inability: Not on file  . Transportation needs:    Medical: Not on file    Non-medical: Not on file  Tobacco Use  . Smoking status: Current Every Day Smoker    Packs/day: 0.50    Years: 44.00    Pack years: 22.00    Types: Cigarettes  . Smokeless tobacco: Never Used  Substance and Sexual Activity  . Alcohol use: No  . Drug use: No  . Sexual activity: Not Currently  Lifestyle  . Physical activity:    Days per week: Not on file    Minutes per session: Not on file  . Stress: Not on file  Relationships  . Social connections:    Talks on phone: Not on file    Gets together: Not on file    Attends religious service: Not on file    Active member of club or organization: Not on file    Attends meetings of clubs or organizations: Not on file    Relationship status: Not on file  . Intimate partner  violence:    Fear of current or ex partner: Not on file    Emotionally abused: Not on file    Physically abused: Not on file    Forced sexual activity: Not on file  Other Topics Concern  . Not on file  Social History Narrative  . Not on file     Vitals:   03/25/18 1116  BP: (!) 150/74  Pulse: 93  SpO2:  97%  Weight: 188 lb (85.3 kg)  Height: 5\' 9"  (1.753 m)    Wt Readings from Last 3 Encounters:  03/25/18 188 lb (85.3 kg)  01/29/18 195 lb 3.2 oz (88.5 kg)  11/23/17 202 lb 12.8 oz (92 kg)     PHYSICAL EXAM General: NAD HEENT: Normal. Neck: No JVD, no thyromegaly. Lungs: Diminished sounds throughout, no crackles or wheezes. CV: Regular rate and rhythm, normal S1/S2, no S3/S4, no murmur. No pretibial or periankle edema.  No carotid bruit.   Abdomen: Soft, nontender, no distention.  Neurologic: Alert and oriented.  Psych: Normal affect. Skin: Normal. Musculoskeletal: No gross deformities.    ECG: Reviewed above under Subjective   Labs: Lab Results  Component Value Date/Time   K 3.5 01/29/2018 11:15 AM   BUN 7 (L) 01/29/2018 11:15 AM   CREATININE 1.40 (H) 01/29/2018 11:15 AM   ALT 14 01/29/2018 11:15 AM   TSH 1.730 07/27/2017 12:32 PM   HGB 12.4 (L) 01/29/2018 11:15 AM     Lipids: Lab Results  Component Value Date/Time   LDLCALC 76 01/29/2018 11:15 AM   CHOL 134 01/29/2018 11:15 AM   TRIG 156 (H) 01/29/2018 11:15 AM   HDL 27 (L) 01/29/2018 11:15 AM       ASSESSMENT AND PLAN: 1.  Chronic diastolic heart failure: Symptomatically stable.  No changes to therapy.  Continue torsemide.  2.  Coronary artery disease: Symptomatically stable.  I will resume carvedilol.  Continue aspirin, Plavix, Imdur, and pravastatin.  I may continue dual antiplatelet therapy indefinitely.  3.  Hyperlipidemia: LDL 76 on 01/29/2018.  Continue pravastatin.  4.  Chronic kidney disease stage III: Creatinine 1.4 on 01/29/2018.  5.  Peripheral vascular disease: On aspirin and  statin.  He has claudication pain which is stable.  He has a long history of tobacco abuse. ABIs showed aortoiliac atherosclerosis with greater than 50% stenosis of the bilateral external iliac arteries, left greater than right, 50-74% stenosis of the mid and distal right SFA, 30-49% stenosis of the left mid SFA, and 50-74% stenosis of the left distal SFA. There was two-vessel runoff bilaterally with both posterior tibial arteries being occluded. He needs tobacco cessation. He should follow up with Dr. Gwenlyn Found as previously planned.  6.  Hypertension: Blood pressure is elevated.  I will resume carvedilol 25 mg twice daily.   Disposition: Follow up 6 months   Kate Sable, M.D., F.A.C.C.

## 2018-03-25 NOTE — Telephone Encounter (Signed)
Last seen 01/29/18  [facility]

## 2018-03-25 NOTE — Patient Instructions (Signed)
Medication Instructions:   Continue the Coreg 25mg  twice a day.  Continue all other medications.    Labwork: none  Testing/Procedures: none  Follow-Up: Your physician wants you to follow up in: 6 months.  You will receive a reminder letter in the mail one-two months in advance.  If you don't receive a letter, please call our office to schedule the follow up appointment   Any Other Special Instructions Will Be Listed Below (If Applicable).  If you need a refill on your cardiac medications before your next appointment, please call your pharmacy.

## 2018-04-11 DIAGNOSIS — N183 Chronic kidney disease, stage 3 (moderate): Secondary | ICD-10-CM | POA: Diagnosis not present

## 2018-04-11 DIAGNOSIS — R809 Proteinuria, unspecified: Secondary | ICD-10-CM | POA: Diagnosis not present

## 2018-04-11 DIAGNOSIS — E1122 Type 2 diabetes mellitus with diabetic chronic kidney disease: Secondary | ICD-10-CM | POA: Diagnosis not present

## 2018-04-11 DIAGNOSIS — E559 Vitamin D deficiency, unspecified: Secondary | ICD-10-CM | POA: Diagnosis not present

## 2018-04-11 DIAGNOSIS — D649 Anemia, unspecified: Secondary | ICD-10-CM | POA: Diagnosis not present

## 2018-04-11 DIAGNOSIS — Z79899 Other long term (current) drug therapy: Secondary | ICD-10-CM | POA: Diagnosis not present

## 2018-04-11 DIAGNOSIS — I129 Hypertensive chronic kidney disease with stage 1 through stage 4 chronic kidney disease, or unspecified chronic kidney disease: Secondary | ICD-10-CM | POA: Diagnosis not present

## 2018-05-02 ENCOUNTER — Ambulatory Visit (INDEPENDENT_AMBULATORY_CARE_PROVIDER_SITE_OTHER): Payer: Medicare HMO | Admitting: Physician Assistant

## 2018-05-02 ENCOUNTER — Encounter: Payer: Self-pay | Admitting: Physician Assistant

## 2018-05-02 VITALS — BP 158/73 | HR 95 | Temp 97.6°F | Ht 69.0 in | Wt 187.4 lb

## 2018-05-02 DIAGNOSIS — M5136 Other intervertebral disc degeneration, lumbar region: Secondary | ICD-10-CM

## 2018-05-02 DIAGNOSIS — N182 Chronic kidney disease, stage 2 (mild): Secondary | ICD-10-CM

## 2018-05-02 DIAGNOSIS — N183 Chronic kidney disease, stage 3 unspecified: Secondary | ICD-10-CM

## 2018-05-02 DIAGNOSIS — E1122 Type 2 diabetes mellitus with diabetic chronic kidney disease: Secondary | ICD-10-CM | POA: Diagnosis not present

## 2018-05-02 DIAGNOSIS — M51369 Other intervertebral disc degeneration, lumbar region without mention of lumbar back pain or lower extremity pain: Secondary | ICD-10-CM

## 2018-05-02 LAB — BAYER DCA HB A1C WAIVED: HB A1C (BAYER DCA - WAIVED): 7.3 % — ABNORMAL HIGH (ref <7.0)

## 2018-05-02 MED ORDER — HYDROCODONE-ACETAMINOPHEN 10-325 MG PO TABS: 1.0000 | ORAL_TABLET | Freq: Four times a day (QID) | ORAL | 0 refills | Status: DC | PRN

## 2018-05-03 ENCOUNTER — Ambulatory Visit: Payer: Medicare Other | Admitting: Physician Assistant

## 2018-05-03 LAB — CBC WITH DIFFERENTIAL/PLATELET
Basophils Absolute: 0.1 10*3/uL (ref 0.0–0.2)
Basos: 1 %
EOS (ABSOLUTE): 0.3 10*3/uL (ref 0.0–0.4)
Eos: 4 %
Hematocrit: 38.5 % (ref 37.5–51.0)
Hemoglobin: 12.2 g/dL — ABNORMAL LOW (ref 13.0–17.7)
Immature Grans (Abs): 0 10*3/uL (ref 0.0–0.1)
Immature Granulocytes: 0 %
Lymphocytes Absolute: 2 10*3/uL (ref 0.7–3.1)
Lymphs: 29 %
MCH: 26.6 pg (ref 26.6–33.0)
MCHC: 31.7 g/dL (ref 31.5–35.7)
MCV: 84 fL (ref 79–97)
Monocytes Absolute: 0.6 10*3/uL (ref 0.1–0.9)
Monocytes: 9 %
Neutrophils Absolute: 4 10*3/uL (ref 1.4–7.0)
Neutrophils: 57 %
PLATELETS: 263 10*3/uL (ref 150–450)
RBC: 4.58 x10E6/uL (ref 4.14–5.80)
RDW: 13.9 % (ref 11.6–15.4)
WBC: 7 10*3/uL (ref 3.4–10.8)

## 2018-05-03 LAB — LIPID PANEL
CHOL/HDL RATIO: 5.6 ratio — AB (ref 0.0–5.0)
Cholesterol, Total: 135 mg/dL (ref 100–199)
HDL: 24 mg/dL — ABNORMAL LOW (ref >39)
LDL Calculated: 76 mg/dL (ref 0–99)
Triglycerides: 173 mg/dL — ABNORMAL HIGH (ref 0–149)
VLDL Cholesterol Cal: 35 mg/dL (ref 5–40)

## 2018-05-03 LAB — CMP14+EGFR
A/G RATIO: 1.4 (ref 1.2–2.2)
ALT: 8 IU/L (ref 0–44)
AST: 14 IU/L (ref 0–40)
Albumin: 4.1 g/dL (ref 3.8–4.8)
Alkaline Phosphatase: 144 IU/L — ABNORMAL HIGH (ref 39–117)
BUN/Creatinine Ratio: 5 — ABNORMAL LOW (ref 10–24)
BUN: 8 mg/dL (ref 8–27)
Bilirubin Total: 0.4 mg/dL (ref 0.0–1.2)
CO2: 24 mmol/L (ref 20–29)
Calcium: 9.5 mg/dL (ref 8.6–10.2)
Chloride: 98 mmol/L (ref 96–106)
Creatinine, Ser: 1.47 mg/dL — ABNORMAL HIGH (ref 0.76–1.27)
GFR calc Af Amer: 56 mL/min/{1.73_m2} — ABNORMAL LOW (ref >59)
GFR, EST NON AFRICAN AMERICAN: 49 mL/min/{1.73_m2} — AB (ref >59)
Globulin, Total: 2.9 g/dL (ref 1.5–4.5)
Glucose: 285 mg/dL — ABNORMAL HIGH (ref 65–99)
POTASSIUM: 4 mmol/L (ref 3.5–5.2)
Sodium: 137 mmol/L (ref 134–144)
Total Protein: 7 g/dL (ref 6.0–8.5)

## 2018-05-05 NOTE — Progress Notes (Signed)
BP (!) 158/73   Pulse 95   Temp 97.6 F (36.4 C) (Oral)   Ht '5\' 9"'$  (1.753 m)   Wt 187 lb 6.4 oz (85 kg)   BMI 27.67 kg/m    Subjective:    Patient ID: Tommy Douglas, male    DOB: 10-Aug-1950, 67 y.o.   MRN: [id number]  HPI: Tommy Douglas is a 68 y.o. male presenting on 05/02/2018 for Diabetes; Hypertension; Hyperlipidemia; and Leg Pain This patient comes in for periodic recheck on medications and conditions including DDD, type 2 diabetes, CKD 3. He reports that he has been well overall.  There are no new complaints at this time.   All medications are reviewed today. There are no reports of any problems with the medications. All of the medical conditions are reviewed and updated.  Lab work is reviewed and will be ordered as medically necessary. There are no new problems reported with today's visit.    Past Medical History:  Diagnosis Date  . Anxiety   . Arthritis    "qwhere" (12/20/2016)  . Chronic lower back pain   . CKD (chronic kidney disease), stage IV ([place])   . Complication of anesthesia    "got the wrong kind of anesthesia ~ 2000 when they were looking down into my stomach"  . Coronary artery disease   . Gout   . Heart disease   . Hyperlipidemia   . Hypertension   . Pneumonia ~ 2015  . Seasonal allergies   . Type II diabetes mellitus ([facility])    Relevant past medical, surgical, family and social history reviewed and updated as indicated. Interim medical history since our last visit reviewed. Allergies and medications reviewed and updated. DATA REVIEWED: CHART IN EPIC  Family History reviewed for pertinent findings.  Review of Systems  Constitutional: Negative.  Negative for appetite change and fatigue.  Eyes: Negative for pain and visual disturbance.  Respiratory: Negative.  Negative for cough, chest tightness, shortness of breath and wheezing.   Cardiovascular: Negative.  Negative for chest pain, palpitations and leg swelling.  Gastrointestinal: Negative.   Negative for abdominal pain, diarrhea, nausea and vomiting.  Genitourinary: Negative.   Musculoskeletal: Positive for arthralgias, back pain and myalgias.  Skin: Negative.  Negative for color change and rash.  Neurological: Negative.  Negative for weakness, numbness and headaches.  Psychiatric/Behavioral: Negative.     Allergies as of 05/02/2018      Reactions   Atorvastatin    All statins make him cough   Statins Cough   Pt is currently taking pravastatin       Medication List       Accurate as of May 02, 2018 11:59 PM. Always use your most recent med list.        acetaminophen 325 MG tablet Commonly known as:  TYLENOL Take 2 tablets (650 mg total) by mouth every 4 (four) hours as needed for headache or mild pain.   allopurinol 100 MG tablet Commonly known as:  ZYLOPRIM Take 1 tablet (100 mg total) by mouth 3 (three) times daily.   ALPRAZolam 0.5 MG tablet Commonly known as:  XANAX TAKE ONE TABLET TWICE DAILY AS NEEDED   aspirin EC 81 MG tablet Take 81 mg by mouth at bedtime.   bumetanide 0.5 MG tablet Commonly known as:  BUMEX Take 3 tablets (1.5 mg total) by mouth 2 (two) times daily for 7 days.   carvedilol 25 MG tablet Commonly known as:  COREG Take 1 tablet (  25 mg total) by mouth 2 (two) times daily.   cetirizine 10 MG tablet Commonly known as:  ZYRTEC Take 1 tablet (10 mg total) by mouth daily as needed (for allergies.).   clopidogrel 75 MG tablet Commonly known as:  PLAVIX Take 1 tablet (75 mg total) by mouth at bedtime.   cyclobenzaprine 10 MG tablet Commonly known as:  FLEXERIL Take 1 tablet (10 mg total) by mouth 3 (three) times daily as needed for muscle spasms (typically scheduled at bedtime).   ergocalciferol 1.25 MG (50000 UT) capsule Commonly known as:  VITAMIN D2 Take 50,000 Units by mouth once a week.   hydrochlorothiazide 25 MG tablet Commonly known as:  HYDRODIURIL Take 1 tablet (25 mg total) by mouth daily.     HYDROcodone-acetaminophen 10-325 MG tablet Commonly known as:  NORCO Take 1 tablet by mouth every 6 (six) hours as needed.   HYDROcodone-acetaminophen 10-325 MG tablet Commonly known as:  NORCO Take 1 tablet by mouth every 6 (six) hours as needed.   HYDROcodone-acetaminophen 10-325 MG tablet Commonly known as:  NORCO Take 1 tablet by mouth every 6 (six) hours as needed.   isosorbide mononitrate 60 MG 24 hr tablet Commonly known as:  IMDUR Take 1 tablet (60 mg total) by mouth daily.   liraglutide 18 MG/3ML Sopn Commonly known as:  VICTOZA Inject 0.3 mLs (1.8 mg total) into the skin daily.   lisinopril 5 MG tablet Commonly known as:  PRINIVIL,ZESTRIL Take 5 mg by mouth 1 day or 1 dose.   LYRICA 200 MG capsule Generic drug:  pregabalin TAKE ONE CAPSULE BY MOUTH TWICE A DAY   nitroGLYCERIN 0.4 MG SL tablet Commonly known as:  NITROSTAT Place 1 tablet (0.4 mg total) under the tongue every 5 (five) minutes as needed for chest pain.   pantoprazole 40 MG tablet Commonly known as:  PROTONIX Take 1 tablet (40 mg total) by mouth daily at 6 (six) AM.   pravastatin 80 MG tablet Commonly known as:  PRAVACHOL Take 1 tablet (80 mg total) by mouth every evening.   torsemide 20 MG tablet Commonly known as:  DEMADEX Take 1 tablet (20 mg total) by mouth 2 (two) times daily.   ULTICARE MICRO PEN NEEDLES 32G X 4 MM Misc Generic drug:  Insulin Pen Needle EVERY DAY   VENTOLIN HFA 108 (90 Base) MCG/ACT inhaler Generic drug:  albuterol Inhale 2 puffs into the lungs every 6 (six) hours as needed for wheezing or shortness of breath.          Objective:    BP (!) 158/73   Pulse 95   Temp 97.6 F (36.4 C) (Oral)   Ht '5\' 9"'$  (1.753 m)   Wt 187 lb 6.4 oz (85 kg)   BMI 27.67 kg/m   Allergies  Allergen Reactions  . Atorvastatin     All statins make him cough  . Statins Cough    Pt is currently taking pravastatin     Wt Readings from Last 3 Encounters:  05/02/18 187 lb 6.4 oz  (85 kg)  03/25/18 188 lb (85.3 kg)  01/29/18 195 lb 3.2 oz (88.5 kg)    Physical Exam Vitals signs and nursing note reviewed.  Constitutional:      General: He is not in acute distress.    Appearance: He is well-developed.  HENT:     Head: Normocephalic and atraumatic.  Eyes:     Conjunctiva/sclera: Conjunctivae normal.     Pupils: Pupils are equal, round, and  reactive to light.  Cardiovascular:     Rate and Rhythm: Normal rate and regular rhythm.     Heart sounds: Normal heart sounds.  Pulmonary:     Effort: Pulmonary effort is normal. No respiratory distress.     Breath sounds: Normal breath sounds.  Skin:    General: Skin is warm and dry.  Psychiatric:        Behavior: Behavior normal.     Results for orders placed or performed in visit on 05/02/18  CBC with Differential/Platelet  Result Value Ref Range   WBC 7.0 3.4 - 10.8 x10E3/uL   RBC 4.58 4.14 - 5.80 x10E6/uL   Hemoglobin 12.2 (L) 13.0 - 17.7 g/dL   Hematocrit 38.5 37.5 - 51.0 %   MCV 84 79 - 97 fL   MCH 26.6 26.6 - 33.0 pg   MCHC 31.7 31.5 - 35.7 g/dL   RDW 13.9 11.6 - 15.4 %   Platelets 263 150 - 450 x10E3/uL   Neutrophils 57 Not Estab. %   Lymphs 29 Not Estab. %   Monocytes 9 Not Estab. %   Eos 4 Not Estab. %   Basos 1 Not Estab. %   Neutrophils Absolute 4.0 1.4 - 7.0 x10E3/uL   Lymphocytes Absolute 2.0 0.7 - 3.1 x10E3/uL   Monocytes Absolute 0.6 0.1 - 0.9 x10E3/uL   EOS (ABSOLUTE) 0.3 0.0 - 0.4 x10E3/uL   Basophils Absolute 0.1 0.0 - 0.2 x10E3/uL   Immature Granulocytes 0 Not Estab. %   Immature Grans (Abs) 0.0 0.0 - 0.1 x10E3/uL  CMP14+EGFR  Result Value Ref Range   Glucose 285 (H) 65 - 99 mg/dL   BUN 8 8 - 27 mg/dL   Creatinine, Ser 1.47 (H) 0.76 - 1.27 mg/dL   GFR calc non Af Amer 49 (L) >59 mL/min/1.73   GFR calc Af Amer 56 (L) >59 mL/min/1.73   BUN/Creatinine Ratio 5 (L) 10 - 24   Sodium 137 134 - 144 mmol/L   Potassium 4.0 3.5 - 5.2 mmol/L   Chloride 98 96 - 106 mmol/L   CO2 24 20 - 29  mmol/L   Calcium 9.5 8.6 - 10.2 mg/dL   Total Protein 7.0 6.0 - 8.5 g/dL   Albumin 4.1 3.8 - 4.8 g/dL   Globulin, Total 2.9 1.5 - 4.5 g/dL   Albumin/Globulin Ratio 1.4 1.2 - 2.2   Bilirubin Total 0.4 0.0 - 1.2 mg/dL   Alkaline Phosphatase 144 (H) 39 - 117 IU/L   AST 14 0 - 40 IU/L   ALT 8 0 - 44 IU/L  Lipid panel  Result Value Ref Range   Cholesterol, Total 135 100 - 199 mg/dL   Triglycerides 173 (H) 0 - 149 mg/dL   HDL 24 (L) >39 mg/dL   VLDL Cholesterol Cal 35 5 - 40 mg/dL   LDL Calculated 76 0 - 99 mg/dL   Chol/HDL Ratio 5.6 (H) 0.0 - 5.0 ratio  Bayer DCA Hb A1c Waived  Result Value Ref Range   HB A1C (BAYER DCA - WAIVED) 7.3 (H) <7.0 %      Assessment & Plan:   1. DDD (degenerative disc disease), lumbar - HYDROcodone-acetaminophen (NORCO) 10-325 MG tablet; Take 1 tablet by mouth every 6 (six) hours as needed.  Dispense: 120 tablet; Refill: 0 - HYDROcodone-acetaminophen (NORCO) 10-325 MG tablet; Take 1 tablet by mouth every 6 (six) hours as needed.  Dispense: 120 tablet; Refill: 0 - HYDROcodone-acetaminophen (NORCO) 10-325 MG tablet; Take 1 tablet by mouth every 6 (  six) hours as needed.  Dispense: 120 tablet; Refill: 0  2. Type 2 diabetes mellitus with stage 2 chronic kidney disease, without long-term current use of insulin ([facility]) - CMP14+EGFR - Lipid panel - Bayer DCA Hb A1c Waived  3. Stage 3 chronic kidney disease ([facility]) - CBC with Differential/Platelet - CMP14+EGFR - Lipid panel   Continue all other maintenance medications as listed above.  Follow up plan: No follow-ups on file.  Educational handout given for [place] PA-C [facility] [address]  [place], [place] [address] [phone]   05/05/2018, 9:20 PM

## 2018-05-23 ENCOUNTER — Other Ambulatory Visit: Payer: Self-pay | Admitting: Cardiovascular Disease

## 2018-05-23 ENCOUNTER — Other Ambulatory Visit: Payer: Self-pay | Admitting: Family Medicine

## 2018-05-23 DIAGNOSIS — M5136 Other intervertebral disc degeneration, lumbar region: Secondary | ICD-10-CM

## 2018-05-27 ENCOUNTER — Ambulatory Visit (INDEPENDENT_AMBULATORY_CARE_PROVIDER_SITE_OTHER): Payer: Medicare HMO | Admitting: *Deleted

## 2018-05-27 ENCOUNTER — Encounter: Payer: Self-pay | Admitting: *Deleted

## 2018-05-27 VITALS — BP 143/81 | HR 94 | Ht 69.0 in | Wt 187.0 lb

## 2018-05-27 DIAGNOSIS — Z0000 Encounter for general adult medical examination without abnormal findings: Secondary | ICD-10-CM

## 2018-05-27 DIAGNOSIS — Z23 Encounter for immunization: Secondary | ICD-10-CM

## 2018-05-27 DIAGNOSIS — H9191 Unspecified hearing loss, right ear: Secondary | ICD-10-CM

## 2018-05-27 DIAGNOSIS — Z1212 Encounter for screening for malignant neoplasm of rectum: Secondary | ICD-10-CM

## 2018-05-27 DIAGNOSIS — Z029 Encounter for administrative examinations, unspecified: Secondary | ICD-10-CM

## 2018-05-27 DIAGNOSIS — H9190 Unspecified hearing loss, unspecified ear: Secondary | ICD-10-CM

## 2018-05-27 DIAGNOSIS — Z1211 Encounter for screening for malignant neoplasm of colon: Secondary | ICD-10-CM

## 2018-05-27 NOTE — Patient Instructions (Addendum)
Please work on your goal of exercising 3 times per week for 30 minutes each session.  Walking is a great option.   At your convenience, please bring a copy of your Advance Directives (Healthcare Power of Attorney and Living Will) to our office to be filed in your medical record.  Please remember to go for your eye exam as soon as possible and ask your eye doctor's office to send results to [place].   You will be contacted regarding setting up an appointment for consultation for a colonoscopy.   Please follow up with Particia Nearing PA as scheduled.   Thank you for coming in for your Annual Wellness Visit today!  Preventive Care 33 Years and Older, Male Preventive care refers to lifestyle choices and visits with your health care provider that can promote health and wellness. What does preventive care include?   A yearly physical exam. This is also called an annual well check.  Dental exams once or twice a year.  Routine eye exams. Ask your health care provider how often you should have your eyes checked.  Personal lifestyle choices, including: ? Daily care of your teeth and gums. ? Regular physical activity. ? Eating a healthy diet. ? Avoiding tobacco and drug use. ? Limiting alcohol use. ? Practicing safe sex. ? Taking low doses of aspirin every day. ? Taking vitamin and mineral supplements as recommended by your health care provider. What happens during an annual well check? The services and screenings done by your health care provider during your annual well check will depend on your age, overall health, lifestyle risk factors, and family history of disease. Counseling Your health care provider may ask you questions about your:  Alcohol use.  Tobacco use.  Drug use.  Emotional well-being.  Home and relationship well-being.  Sexual activity.  Eating habits.  History of falls.  Memory and ability to understand (cognition).  Work and work  Statistician. Screening You may have the following tests or measurements:  Height, weight, and BMI.  Blood pressure.  Lipid and cholesterol levels. These may be checked every 5 years, or more frequently if you are over 30 years old.  Skin check.  Lung cancer screening. You may have this screening every year starting at age 68 if you have a 30-pack-year history of smoking and currently smoke or have quit within the past 15 years.  Colorectal cancer screening. All adults should have this screening starting at age 19 and continuing until age 68. You will have tests every 1-10 years, depending on your results and the type of screening test. People at increased risk should start screening at an earlier age. Screening tests may include: ? Guaiac-based fecal occult blood testing. ? Fecal immunochemical test (FIT). ? Stool DNA test. ? Virtual colonoscopy. ? Sigmoidoscopy. During this test, a flexible tube with a tiny camera (sigmoidoscope) is used to examine your rectum and lower colon. The sigmoidoscope is inserted through your anus into your rectum and lower colon. ? Colonoscopy. During this test, a long, thin, flexible tube with a tiny camera (colonoscope) is used to examine your entire colon and rectum.  Prostate cancer screening. Recommendations will vary depending on your family history and other risks.  Hepatitis C blood test.  Hepatitis B blood test.  Sexually transmitted disease (STD) testing.  Diabetes screening. This is done by checking your blood sugar (glucose) after you have not eaten for a while (fasting). You may have this done every 1-3 years.  Abdominal aortic aneurysm (AAA) screening. You may need this if you are a current or former smoker.  Osteoporosis. You may be screened starting at age 68 if you are at high risk. Talk with your health care provider about your test results, treatment options, and if necessary, the need for more tests. Vaccines Your health care  provider may recommend certain vaccines, such as:  Influenza vaccine. This is recommended every year.  Tetanus, diphtheria, and acellular pertussis (Tdap, Td) vaccine. You may need a Td booster every 10 years.  Varicella vaccine. You may need this if you have not been vaccinated.  Zoster vaccine. You may need this after age 53.  Measles, mumps, and rubella (MMR) vaccine. You may need at least one dose of MMR if you were born in 1957 or later. You may also need a second dose.  Pneumococcal 13-valent conjugate (PCV13) vaccine. One dose is recommended after age 68.  Pneumococcal polysaccharide (PPSV23) vaccine. One dose is recommended after age 68.  Meningococcal vaccine. You may need this if you have certain conditions.  Hepatitis A vaccine. You may need this if you have certain conditions or if you travel or work in places where you may be exposed to hepatitis A.  Hepatitis B vaccine. You may need this if you have certain conditions or if you travel or work in places where you may be exposed to hepatitis B.  Haemophilus influenzae type b (Hib) vaccine. You may need this if you have certain risk factors. Talk to your health care provider about which screenings and vaccines you need and how often you need them. This information is not intended to replace advice given to you by your health care provider. Make sure you discuss any questions you have with your health care provider. Document Released: 04/09/2015 Document Revised: 05/03/2017 Document Reviewed: 01/12/2015 Elsevier Interactive Patient Education  2019 [place].   Diabetes Mellitus and Nutrition, Adult When you have diabetes (diabetes mellitus), it is very important to have healthy eating habits because your blood sugar (glucose) levels are greatly affected by what you eat and drink. Eating healthy foods in the appropriate amounts, at about the same times every day, can help you:  Control your blood glucose.  Lower your  risk of heart disease.  Improve your blood pressure.  Reach or maintain a healthy weight. Every person with diabetes is different, and each person has different needs for a meal plan. Your health care provider may recommend that you work with a diet and nutrition specialist (dietitian) to make a meal plan that is best for you. Your meal plan may vary depending on factors such as:  The calories you need.  The medicines you take.  Your weight.  Your blood glucose, blood pressure, and cholesterol levels.  Your activity level.  Other health conditions you have, such as heart or kidney disease. How do carbohydrates affect me? Carbohydrates, also called carbs, affect your blood glucose level more than any other type of food. Eating carbs naturally raises the amount of glucose in your blood. Carb counting is a method for keeping track of how many carbs you eat. Counting carbs is important to keep your blood glucose at a healthy level, especially if you use insulin or take certain oral diabetes medicines. It is important to know how many carbs you can safely have in each meal. This is different for every person. Your dietitian can help you calculate how many carbs you should have at each meal and for each  snack. Foods that contain carbs include:  Bread, cereal, rice, pasta, and crackers.  Potatoes and corn.  Peas, beans, and lentils.  Milk and yogurt.  Fruit and juice.  Desserts, such as cakes, cookies, ice cream, and candy. How does alcohol affect me? Alcohol can cause a sudden decrease in blood glucose (hypoglycemia), especially if you use insulin or take certain oral diabetes medicines. Hypoglycemia can be a life-threatening condition. Symptoms of hypoglycemia (sleepiness, dizziness, and confusion) are similar to symptoms of having too much alcohol. If your health care provider says that alcohol is safe for you, follow these guidelines:  Limit alcohol intake to no more than 1 drink  per day for nonpregnant women and 2 drinks per day for men. One drink equals 12 oz of beer, 5 oz of wine, or 1 oz of hard liquor.  Do not drink on an empty stomach.  Keep yourself hydrated with water, diet soda, or unsweetened iced tea.  Keep in mind that regular soda, juice, and other mixers may contain a lot of sugar and must be counted as carbs. What are tips for following this plan?  Reading food labels  Start by checking the serving size on the "Nutrition Facts" label of packaged foods and drinks. The amount of calories, carbs, fats, and other nutrients listed on the label is based on one serving of the item. Many items contain more than one serving per package.  Check the total grams (g) of carbs in one serving. You can calculate the number of servings of carbs in one serving by dividing the total carbs by 15. For example, if a food has 30 g of total carbs, it would be equal to 2 servings of carbs.  Check the number of grams (g) of saturated and trans fats in one serving. Choose foods that have low or no amount of these fats.  Check the number of milligrams (mg) of salt (sodium) in one serving. Most people should limit total sodium intake to less than 2,300 mg per day.  Always check the nutrition information of foods labeled as "low-fat" or "nonfat". These foods may be higher in added sugar or refined carbs and should be avoided.  Talk to your dietitian to identify your daily goals for nutrients listed on the label. Shopping  Avoid buying canned, premade, or processed foods. These foods tend to be high in fat, sodium, and added sugar.  Shop around the outside edge of the grocery store. This includes fresh fruits and vegetables, bulk grains, fresh meats, and fresh dairy. Cooking  Use low-heat cooking methods, such as baking, instead of high-heat cooking methods like deep frying.  Cook using healthy oils, such as olive, canola, or sunflower oil.  Avoid cooking with butter,  cream, or high-fat meats. Meal planning  Eat meals and snacks regularly, preferably at the same times every day. Avoid going long periods of time without eating.  Eat foods high in fiber, such as fresh fruits, vegetables, beans, and whole grains. Talk to your dietitian about how many servings of carbs you can eat at each meal.  Eat 4-6 ounces (oz) of lean protein each day, such as lean meat, chicken, fish, eggs, or tofu. One oz of lean protein is equal to: ? 1 oz of meat, chicken, or fish. ? 1 egg. ?  cup of tofu.  Eat some foods each day that contain healthy fats, such as avocado, nuts, seeds, and fish. Lifestyle  Check your blood glucose regularly.  Exercise regularly as told  by your health care provider. This may include: ? 150 minutes of moderate-intensity or vigorous-intensity exercise each week. This could be brisk walking, biking, or water aerobics. ? Stretching and doing strength exercises, such as yoga or weightlifting, at least 2 times a week.  Take medicines as told by your health care provider.  Do not use any products that contain nicotine or tobacco, such as cigarettes and e-cigarettes. If you need help quitting, ask your health care provider.  Work with a Social worker or diabetes educator to identify strategies to manage stress and any emotional and social challenges. Questions to ask a health care provider  Do I need to meet with a diabetes educator?  Do I need to meet with a dietitian?  What number can I call if I have questions?  When are the best times to check my blood glucose? Where to find more information:  American Diabetes Association: diabetes.org  Academy of Nutrition and Dietetics: www.eatright.[employer] of Diabetes and Digestive and Kidney Diseases (NIH): [employer] Summary  A healthy meal plan will help you control your blood glucose and maintain a healthy lifestyle.  Working with a diet and nutrition specialist  (dietitian) can help you make a meal plan that is best for you.  Keep in mind that carbohydrates (carbs) and alcohol have immediate effects on your blood glucose levels. It is important to count carbs and to use alcohol carefully. This information is not intended to replace advice given to you by your health care provider. Make sure you discuss any questions you have with your health care provider. Document Released: 12/08/2004 Document Revised: 10/11/2016 Document Reviewed: 04/17/2016 Elsevier Interactive Patient Education  2019 [employer].

## 2018-05-27 NOTE — Progress Notes (Signed)
Subjective:   Tommy Douglas is a 68 y.o. male who presents for a subsequent Medicare Annual Wellness Visit.  Tommy Douglas is accompanied today by his long time girlfriend Tommy Douglas. He worked at [employer] until he went out of work on disability several years ago due to leg pain.  He enjoys working in his yard, watching television, and spending time with his grandchildren.  He lives with his girlfriend of 5 years, and one of his sons.  He has 7 children, 19 grandchildren, and 1 great grandchild.  Patient Care Team: Tommy Douglas as PCP - General (Physician Assistant) Tommy Commons, MD as PCP - Cardiology (Cardiology) Tommy Commons, MD as Attending Physician (Cardiology) Tommy Harp, MD as Consulting Physician (Cardiology) Tommy Lowes, MD as Consulting Physician (Nephrology)  Hospitalizations, surgeries, and ER visits in previous 12 months No hospitalizations, ER visits, or surgeries this past year.   Review of Systems    Patient reports that his overall health is unchanged compared to last year.  Cardiac Risk Factors include: advanced age (>43men, >23 women);diabetes mellitus;dyslipidemia;hypertension;male gender;sedentary lifestyle;smoking/ tobacco exposure  Musculoskeletal - bilateral hand pain, right worse than left   All other systems negative       Current Medications (verified) Outpatient Encounter Medications as of 05/27/2018  Medication Sig  . acetaminophen (TYLENOL) 325 MG tablet Take 2 tablets (650 mg total) by mouth every 4 (four) hours as needed for headache or mild pain.  [name] allopurinol (ZYLOPRIM) 100 MG tablet Take 1 tablet (100 mg total) by mouth 3 (three) times daily.  [name] ALPRAZolam (XANAX) 0.5 MG tablet TAKE ONE TABLET TWICE DAILY AS NEEDED  . aspirin EC 81 MG tablet Take 81 mg by mouth at bedtime.   . carvedilol (COREG) 25 MG tablet Take 1 tablet (25 mg total) by mouth 2 (two) times daily.  . cetirizine (ZYRTEC) 10 MG tablet  Take 1 tablet (10 mg total) by mouth daily as needed (for allergies.).  [name] cyclobenzaprine (FLEXERIL) 10 MG tablet Take 1 tablet (10 mg total) by mouth 3 (three) times daily as needed for muscle spasms (typically scheduled at bedtime).  . ergocalciferol (VITAMIN D2) 1.25 MG (50000 UT) capsule Take 50,000 Units by mouth once a week.  . hydrochlorothiazide (HYDRODIURIL) 25 MG tablet Take 1 tablet (25 mg total) by mouth daily.  [name] HYDROcodone-acetaminophen (NORCO) 10-325 MG tablet Take 1 tablet by mouth every 6 (six) hours as needed.  [name] HYDROcodone-acetaminophen (NORCO) 10-325 MG tablet Take 1 tablet by mouth every 6 (six) hours as needed.  [name] HYDROcodone-acetaminophen (NORCO) 10-325 MG tablet Take 1 tablet by mouth every 6 (six) hours as needed.  . isosorbide mononitrate (IMDUR) 60 MG 24 hr tablet TAKE ONE (1) TABLET EACH DAY  . liraglutide (VICTOZA) 18 MG/3ML SOPN Inject 0.3 mLs (1.8 mg total) into the skin daily.  [name] lisinopril (PRINIVIL,ZESTRIL) 5 MG tablet Take 5 mg by mouth 1 day or 1 dose.  . pantoprazole (PROTONIX) 40 MG tablet Take 1 tablet (40 mg total) by mouth daily at 6 (six) AM.  . pregabalin (LYRICA) 200 MG capsule TAKE ONE CAPSULE BY MOUTH TWICE A DAY  . torsemide (DEMADEX) 20 MG tablet Take 1 tablet (20 mg total) by mouth 2 (two) times daily.  [name] ULTICARE MICRO PEN NEEDLES 32G X 4 MM MISC EVERY DAY  . VENTOLIN HFA 108 (90 Base) MCG/ACT inhaler Inhale 2 puffs into the lungs every 6 (six) hours as needed for wheezing or shortness of  breath.  . bumetanide (BUMEX) 0.5 MG tablet Take 3 tablets (1.5 mg total) by mouth 2 (two) times daily for 7 days.  . clopidogrel (PLAVIX) 75 MG tablet Take 1 tablet (75 mg total) by mouth at bedtime.  . nitroGLYCERIN (NITROSTAT) 0.4 MG SL tablet Place 1 tablet (0.4 mg total) under the tongue every 5 (five) minutes as needed for chest pain.  . pravastatin (PRAVACHOL) 80 MG tablet Take 1 tablet (80 mg total) by mouth every evening.   No facility-administered  encounter medications on file as of 05/27/2018.     Allergies (verified) Atorvastatin and Statins   History: Past Medical History:  Diagnosis Date  . Anxiety   . Arthritis    "qwhere" (12/20/2016)  . Chronic lower back pain   . CKD (chronic kidney disease), stage IV ([place])   . Complication of anesthesia    "got the wrong kind of anesthesia ~ 2000 when they were looking down into my stomach"  . Coronary artery disease   . Gout   . Heart disease   . Hyperlipidemia   . Hypertension   . Pneumonia ~ 2015  . Seasonal allergies   . Type II diabetes mellitus ([place])    Past Surgical History:  Procedure Laterality Date  . CARDIAC CATHETERIZATION  12/20/2016  . CORONARY ANGIOPLASTY WITH STENT PLACEMENT  2004 X 2   "1st stent moved"  . CORONARY STENT INTERVENTION N/A 12/21/2016   Procedure: CORONARY STENT INTERVENTION;  Surgeon: Tommy Blanks, MD;  Location: [facility];  Service: Cardiovascular;  Laterality: N/A;  . ESOPHAGOGASTRODUODENOSCOPY  ~ 2000  . LAPAROSCOPIC CHOLECYSTECTOMY    . LEFT HEART CATH AND CORONARY ANGIOGRAPHY N/A 12/20/2016   Procedure: LEFT HEART CATH AND CORONARY ANGIOGRAPHY;  Surgeon: Tommy Blanks, MD;  Location: [facility];  Service: Cardiovascular;  Laterality: N/A;   Family History  Problem Relation Age of Onset  . Cancer Mother        male  . Diabetes Mother   . Stroke Father        x3  . Stroke Brother        x2   . Other Sister        bowel necrosis?   . Other Daughter        gout  . Other Son        gout  . Alcohol abuse Brother   . Other Sister        pneumonia  . Other Sister        pneumonia  . Cancer Daughter        male  . Other Daughter        fluid on brain - disabled    Social History   Socioeconomic History  . Marital status: Widowed    Spouse name: Not on file  . Number of children: 7  . Years of education: Not on file  . Highest education level: High school graduate  Occupational History   . Occupation: retired     Comment: Designer, television/film set and Manufacturing engineer co.  Social Needs  . Financial resource strain: Somewhat hard  . Food insecurity:    Worry: Never true    Inability: Never true  . Transportation needs:    Medical: No    Non-medical: No  Tobacco Use  . Smoking status: Current Every Day Smoker    Packs/day: 0.50    Years: 44.00    Pack years: 22.00    Types:  Cigarettes  . Smokeless tobacco: Never Used  Substance and Sexual Activity  . Alcohol use: No  . Drug use: No  . Sexual activity: Not on file  Lifestyle  . Physical activity:    Days per week: 0 days    Minutes per session: 0 min  . Stress: To some extent  Relationships  . Social connections:    Talks on phone: More than three times a week    Gets together: More than three times a week    Attends religious service: Never    Active member of club or organization: No    Attends meetings of clubs or organizations: Never    Relationship status: Living with partner  Other Topics Concern  . Not on file  Social History Narrative  . Not on file     Clinical Intake:     Pain Score: 9                   Activities of Daily Living In your present state of health, do you have any difficulty performing the following activities: 05/27/2018  Hearing? N  Vision? N  Difficulty concentrating or making decisions? N  Walking or climbing stairs? Y  Comment due to leg pain  Dressing or bathing? N  Doing errands, shopping? N  Preparing Food and eating ? N  Using the Toilet? N  In the past six months, have you accidently leaked urine? N  Do you have problems with loss of bowel control? N  Managing your Medications? N  Managing your Finances? N  Housekeeping or managing your Housekeeping? N  Some recent data might be hidden     Exercise Current Exercise Habits: The patient does not participate in regular exercise at present, Exercise limited by: orthopedic condition(s)  Diet Consumes 2  meals a day and 1 snacks a day.  The patient feels that he mostly follow a Regular diet.  Diet History  Patient states he is usually hungry for only 1-2 meals per day and 1-2 snacks.  He usually eats some type of meat for protein, and vegetables including potatoes and bread for his main meal.  Encouraged increasing non-starchy vegetables, lean proteins, and having whole grains in fruits in moderation.  Patient states for his snacks he usually has peanut butter crackers and popcorn.  Patient states he has access to adequate amounts of food.    Depression Screen PHQ 2/9 Scores 05/27/2018 05/02/2018 01/29/2018 10/29/2017 10/08/2017 10/01/2017 08/22/2017  PHQ - 2 Score 0 0 0 0 0 0 0     Fall Risk Fall Risk  05/27/2018 05/02/2018 01/29/2018 10/29/2017 10/08/2017  Falls in the past year? 0 0 0 No No  Number falls in past yr: - - 0 - -  Injury with Fall? - - 0 - -     Objective:    Today's Vitals   05/27/18 1110  BP: (!) 143/81  Pulse: 94  Weight: 187 lb (84.8 kg)  Height: 5\' 9"  (1.753 m)  PainSc: 9   PainLoc: Hand   Body mass index is 27.62 kg/m.  Advanced Directives 05/27/2018 05/24/2017 12/20/2016 12/20/2016  Does Patient Have a Medical Advance Directive? Yes No - Yes  Type of Paramedic of [place];Living will - [place];Living will [place];Living will  Does patient want to make changes to medical advance directive? No - Patient declined - No - Patient declined No - Patient declined  Copy of Healthcare Power of  Attorney in Chart? No - copy requested - No - copy requested No - copy requested  Would patient like information on creating a medical advance directive? - Yes (MAU/Ambulatory/Procedural Areas - Information given) - -    Hearing/Vision  No hearing or vision deficits noted during visit.  Patient wears glasses and is due for eye exam.  He plans to go within the next month. He states he was evaluated last year at [facility].   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  Will discuss proceeding with referral with Particia Nearing, PA.   Cognitive Function: MMSE - Mini Mental State Exam 05/27/2018 05/24/2017  Orientation to time 5 5  Orientation to Place 5 5  Registration 3 3  Attention/ Calculation 3 5  Recall 2 3  Language- name 2 objects 2 2  Language- repeat 1 1  Language- follow 3 step command 3 3  Language- read & follow direction 1 1  Write a sentence 1 1  Copy design 1 1  Total score 27 30           Immunizations and Health Maintenance Immunization History  Administered Date(s) Administered  . Influenza, High Dose Seasonal PF 01/10/2016, 02/05/2017, 01/29/2018   Health Maintenance Due  Topic Date Due  . Hepatitis C Screening  07-Jun-1950  . FOOT EXAM  09/08/1960  . OPHTHALMOLOGY EXAM  09/08/1960  . TETANUS/TDAP  09/08/1969  . COLONOSCOPY  09/08/2000  . PNA vac Low Risk Adult (1 of 2 - PCV13) Completed    Health Maintenance  Topic Date Due  . Hepatitis C Screening  05-Oct-1950  . FOOT EXAM  09/08/1960  . OPHTHALMOLOGY EXAM  09/08/1960  . TETANUS/TDAP  09/08/1969  . COLONOSCOPY  09/08/2000  . PNA vac Low Risk Adult (1 of 2 - PCV13) Completed   . HEMOGLOBIN A1C  10/31/2018  . INFLUENZA VACCINE  Completed        Assessment:   This is a routine wellness examination for [place].    Plan:    Goals    . Exercise 3x per week (30 min per time)     Walking is a great option.     . Increase physical activity     Walk more for exercise     . Prevent falls    . Quit Smoking        Health Maintenance & Additional Screening Recommendations: Td vaccine Colorectal cancer screening Smoking cessation counseling Advanced directives: has an advanced directive - a copy HAS NOT been provided.  Lung: Low Dose CT Chest recommended if Age 58-80 years, 30 pack-year currently smoking OR have quit w/in 15years. Patient does not  qualify. Hepatitis C Screening recommended: yes, recommend at next visit with Particia Nearing, PA    Today's Orders Orders Placed This Encounter  Procedures  . Ambulatory referral to Gastroenterology    Referral Priority:   Routine    Referral Type:   Consultation    Referral Reason:   Specialty Services Required    Referred to Provider:   Danie Binder, MD    Number of Visits Requested:   1    Keep f/u with Terald Sleeper, PA-C and any other specialty appointments you may have Continue current medications Move carefully to avoid falls.  Aim for at least 150 minutes of moderate activity a week.  Read or work on puzzles daily Stay connected with friends and family  I have  personally reviewed and noted the following in the patient's chart:   . Medical and social history . Use of alcohol, tobacco or illicit drugs  . Current medications and supplements . Functional ability and status . Nutritional status . Physical activity . Advanced directives . List of other physicians . Hospitalizations, surgeries, and ER visits in previous 12 months . Vitals . Screenings to include cognitive, depression, and falls . Referrals and appointments  In addition, I have reviewed and discussed with patient certain preventive protocols, quality metrics, and best practice recommendations. A written personalized care plan for preventive services as well as general preventive health recommendations were provided to patient.     Nolberto Hanlon, RN  05/27/2018

## 2018-05-30 ENCOUNTER — Encounter: Payer: Self-pay | Admitting: Internal Medicine

## 2018-05-31 DIAGNOSIS — I25119 Atherosclerotic heart disease of native coronary artery with unspecified angina pectoris: Secondary | ICD-10-CM | POA: Diagnosis not present

## 2018-05-31 DIAGNOSIS — K08109 Complete loss of teeth, unspecified cause, unspecified class: Secondary | ICD-10-CM | POA: Diagnosis not present

## 2018-05-31 DIAGNOSIS — E1122 Type 2 diabetes mellitus with diabetic chronic kidney disease: Secondary | ICD-10-CM | POA: Diagnosis not present

## 2018-05-31 DIAGNOSIS — G8929 Other chronic pain: Secondary | ICD-10-CM | POA: Diagnosis not present

## 2018-05-31 DIAGNOSIS — K219 Gastro-esophageal reflux disease without esophagitis: Secondary | ICD-10-CM | POA: Diagnosis not present

## 2018-05-31 DIAGNOSIS — E1142 Type 2 diabetes mellitus with diabetic polyneuropathy: Secondary | ICD-10-CM | POA: Diagnosis not present

## 2018-05-31 DIAGNOSIS — E785 Hyperlipidemia, unspecified: Secondary | ICD-10-CM | POA: Diagnosis not present

## 2018-05-31 DIAGNOSIS — I129 Hypertensive chronic kidney disease with stage 1 through stage 4 chronic kidney disease, or unspecified chronic kidney disease: Secondary | ICD-10-CM | POA: Diagnosis not present

## 2018-05-31 DIAGNOSIS — E1165 Type 2 diabetes mellitus with hyperglycemia: Secondary | ICD-10-CM | POA: Diagnosis not present

## 2018-05-31 DIAGNOSIS — I252 Old myocardial infarction: Secondary | ICD-10-CM | POA: Diagnosis not present

## 2018-06-21 ENCOUNTER — Other Ambulatory Visit: Payer: Self-pay | Admitting: Physician Assistant

## 2018-06-21 DIAGNOSIS — M5136 Other intervertebral disc degeneration, lumbar region: Secondary | ICD-10-CM

## 2018-07-04 ENCOUNTER — Ambulatory Visit: Payer: Medicare Other | Admitting: Gastroenterology

## 2018-07-22 ENCOUNTER — Other Ambulatory Visit: Payer: Self-pay | Admitting: Physician Assistant

## 2018-07-31 ENCOUNTER — Ambulatory Visit: Payer: Self-pay | Admitting: Physician Assistant

## 2018-08-08 ENCOUNTER — Ambulatory Visit (INDEPENDENT_AMBULATORY_CARE_PROVIDER_SITE_OTHER): Payer: Medicare HMO | Admitting: Otolaryngology

## 2018-08-08 ENCOUNTER — Other Ambulatory Visit: Payer: Self-pay

## 2018-08-08 DIAGNOSIS — H9011 Conductive hearing loss, unilateral, right ear, with unrestricted hearing on the contralateral side: Secondary | ICD-10-CM | POA: Diagnosis not present

## 2018-08-08 DIAGNOSIS — H903 Sensorineural hearing loss, bilateral: Secondary | ICD-10-CM

## 2018-08-20 ENCOUNTER — Other Ambulatory Visit: Payer: Self-pay | Admitting: Physician Assistant

## 2018-08-20 DIAGNOSIS — M5136 Other intervertebral disc degeneration, lumbar region: Secondary | ICD-10-CM

## 2018-08-26 ENCOUNTER — Encounter (INDEPENDENT_AMBULATORY_CARE_PROVIDER_SITE_OTHER): Payer: Self-pay

## 2018-08-26 ENCOUNTER — Other Ambulatory Visit: Payer: Self-pay

## 2018-08-27 ENCOUNTER — Ambulatory Visit (INDEPENDENT_AMBULATORY_CARE_PROVIDER_SITE_OTHER): Payer: Medicare HMO | Admitting: Physician Assistant

## 2018-08-27 ENCOUNTER — Encounter: Payer: Self-pay | Admitting: Physician Assistant

## 2018-08-27 ENCOUNTER — Other Ambulatory Visit: Payer: Self-pay

## 2018-08-27 VITALS — BP 137/81 | HR 92 | Temp 97.4°F | Ht 69.0 in | Wt 187.6 lb

## 2018-08-27 DIAGNOSIS — F411 Generalized anxiety disorder: Secondary | ICD-10-CM | POA: Diagnosis not present

## 2018-08-27 DIAGNOSIS — M5136 Other intervertebral disc degeneration, lumbar region: Secondary | ICD-10-CM

## 2018-08-27 DIAGNOSIS — N183 Chronic kidney disease, stage 3 unspecified: Secondary | ICD-10-CM

## 2018-08-27 DIAGNOSIS — E1122 Type 2 diabetes mellitus with diabetic chronic kidney disease: Secondary | ICD-10-CM

## 2018-08-27 DIAGNOSIS — N182 Chronic kidney disease, stage 2 (mild): Secondary | ICD-10-CM

## 2018-08-27 LAB — CBC WITH DIFFERENTIAL/PLATELET
Basophils Absolute: 0.1 10*3/uL (ref 0.0–0.2)
Basos: 1 %
EOS (ABSOLUTE): 1.3 10*3/uL — ABNORMAL HIGH (ref 0.0–0.4)
Eos: 15 %
Hematocrit: 42.8 % (ref 37.5–51.0)
Hemoglobin: 13.1 g/dL (ref 13.0–17.7)
Immature Grans (Abs): 0 10*3/uL (ref 0.0–0.1)
Immature Granulocytes: 0 %
Lymphocytes Absolute: 2.1 10*3/uL (ref 0.7–3.1)
Lymphs: 25 %
MCH: 26.6 pg (ref 26.6–33.0)
MCHC: 30.6 g/dL — ABNORMAL LOW (ref 31.5–35.7)
MCV: 87 fL (ref 79–97)
Monocytes Absolute: 0.6 10*3/uL (ref 0.1–0.9)
Monocytes: 7 %
Neutrophils Absolute: 4.4 10*3/uL (ref 1.4–7.0)
Neutrophils: 52 %
Platelets: 283 10*3/uL (ref 150–450)
RBC: 4.92 x10E6/uL (ref 4.14–5.80)
RDW: 13.9 % (ref 11.6–15.4)
WBC: 8.5 10*3/uL (ref 3.4–10.8)

## 2018-08-27 LAB — CMP14+EGFR
ALT: 7 IU/L (ref 0–44)
AST: 16 IU/L (ref 0–40)
Albumin/Globulin Ratio: 1.6 (ref 1.2–2.2)
Albumin: 4.2 g/dL (ref 3.8–4.8)
Alkaline Phosphatase: 135 IU/L — ABNORMAL HIGH (ref 39–117)
BUN/Creatinine Ratio: 7 — ABNORMAL LOW (ref 10–24)
BUN: 12 mg/dL (ref 8–27)
Bilirubin Total: 0.4 mg/dL (ref 0.0–1.2)
CO2: 26 mmol/L (ref 20–29)
Calcium: 9.3 mg/dL (ref 8.6–10.2)
Chloride: 98 mmol/L (ref 96–106)
Creatinine, Ser: 1.64 mg/dL — ABNORMAL HIGH (ref 0.76–1.27)
GFR calc Af Amer: 49 mL/min/{1.73_m2} — ABNORMAL LOW (ref >59)
GFR calc non Af Amer: 43 mL/min/{1.73_m2} — ABNORMAL LOW (ref >59)
Globulin, Total: 2.6 g/dL (ref 1.5–4.5)
Glucose: 206 mg/dL — ABNORMAL HIGH (ref 65–99)
Potassium: 4.1 mmol/L (ref 3.5–5.2)
Sodium: 139 mmol/L (ref 134–144)
Total Protein: 6.8 g/dL (ref 6.0–8.5)

## 2018-08-27 LAB — LIPID PANEL
Chol/HDL Ratio: 6.2 ratio — ABNORMAL HIGH (ref 0.0–5.0)
Cholesterol, Total: 142 mg/dL (ref 100–199)
HDL: 23 mg/dL — ABNORMAL LOW (ref >39)
LDL Calculated: 90 mg/dL (ref 0–99)
Triglycerides: 146 mg/dL (ref 0–149)
VLDL Cholesterol Cal: 29 mg/dL (ref 5–40)

## 2018-08-27 LAB — BAYER DCA HB A1C WAIVED: HB A1C (BAYER DCA - WAIVED): 7.2 % — ABNORMAL HIGH (ref <7.0)

## 2018-08-27 MED ORDER — ALPRAZOLAM 0.25 MG PO TABS: 0.2500 mg | ORAL_TABLET | Freq: Two times a day (BID) | ORAL | 1 refills | Status: DC | PRN

## 2018-08-27 MED ORDER — HYDROCODONE-ACETAMINOPHEN 10-325 MG PO TABS: 1.0000 | ORAL_TABLET | Freq: Four times a day (QID) | ORAL | 0 refills | Status: DC | PRN

## 2018-08-27 MED ORDER — PANTOPRAZOLE SODIUM 40 MG PO TBEC: 40.0000 mg | DELAYED_RELEASE_TABLET | Freq: Every day | ORAL | 5 refills | Status: DC

## 2018-08-27 MED ORDER — PRAVASTATIN SODIUM 80 MG PO TABS: 80.0000 mg | ORAL_TABLET | Freq: Every evening | ORAL | 1 refills | Status: DC

## 2018-08-27 MED ORDER — PREGABALIN 200 MG PO CAPS: 200.0000 mg | ORAL_CAPSULE | Freq: Two times a day (BID) | ORAL | 2 refills | Status: DC

## 2018-08-27 NOTE — Progress Notes (Signed)
BP 137/81   Pulse 92   Temp (!) 97.4 F (36.3 C) (Oral)   Ht _0  (1.753 m)   Wt 187 lb 9.6 oz (85.1 kg)   BMI 27.70 kg/m    Subjective:    Patient ID: Tommy Douglas, male    DOB: 1950-07-09, 68 y.o.   MRN: [id number]  HPI: Tommy Douglas is a 68 y.o. male presenting on 08/27/2018 for Diabetes This patient comes in for periodic recheck on his chronic medical conditions which do include diabetes, chronic kidney disease, generalized anxiety and degenerative disc disease and gout.  He reports that overall he has been doing very well.  He is not had any new changes.  We have had another question about lowering his alprazolam and he agrees to work with this.  We will have labs performed today and refills will be sent.   PAIN ASSESSMENT: Cause of pain- DDD, gout, back pain, OA  This patient returns for a 3 month recheck on narcotic use for the above named conditions  Current medications-hydrocodone 10/325 1 tablet up to 4 times a day times a day as needed for severe pain Flexeril 10 mg 1 daily Lyrica 200 mg 1 twice a day  Alprazolam 0.5 mg we are to reduce to 0.25 mg 1 twice a day as needed for anxiety Medication side effects- none Any concerns- no  Pain on scale of 1-10- 8 Frequency- daily What increases pain- walking What makes pain Better- rest Effects on ADL - moderate Any change in general medical condition- no  Effectiveness of current meds- good Adverse reactions form pain meds-no PMP AWARE website reviewed: Yes Any suspicious activity on PMP Aware: No MME daily dose: 40 LME daily dose: 2, lowering to 1 LME  Contract on file 08/27/18 Last UDS  08/27/18  [facility] script sent or current  History of overdose or risk of abuse no  ANXIETY ASSESSMENT Cause of anxiety: GAD This patient returns for a  month recheck on narcotic use for the above named condition(s)  Current medications- alprazolam 0.5, lowering to .25 BID  Other medications tried: multiple SSRI, SNRI  Medication side effects- no Any concerns- no Any change in general medical condition- no Effectiveness of current meds- good PMP AWARE website reviewed: Yes Any suspicious activity on PMP Aware: No LME daily dose: 2, lowering to 1 this month    Past Medical History:  Diagnosis Date  . Anxiety   . Arthritis    "qwhere" (12/20/2016)  . Chronic lower back pain   . CKD (chronic kidney disease), stage IV ([place])   . Complication of anesthesia    "got the wrong kind of anesthesia ~ 2000 when they were looking down into my stomach"  . Coronary artery disease   . Gout   . Heart disease   . Hyperlipidemia   . Hypertension   . Pneumonia ~ 2015  . Seasonal allergies   . Type II diabetes mellitus (H[facility])    Relevant past medical, surgical, family and social history reviewed and updated as indicated. Interim medical history since our last visit reviewed. Allergies and medications reviewed and updated. DATA REVIEWED: CHART IN EPIC  Family History reviewed for pertinent findings.  Review of Systems  Constitutional: Negative.  Negative for appetite change and fatigue.  Eyes: Negative for pain and visual disturbance.  Respiratory: Negative.  Negative for cough, chest tightness, shortness of breath and wheezing.   Cardiovascular: Negative.  Negative for chest pain, palpitations and leg swelling.  Gastrointestinal: Negative.  Negative for abdominal pain, diarrhea, nausea and vomiting.  Genitourinary: Negative.   Skin: Negative.  Negative for color change and rash.  Neurological: Negative.  Negative for weakness, numbness and headaches.  Psychiatric/Behavioral: Negative.     Allergies as of 08/27/2018      Reactions   Atorvastatin    All statins make him cough   Statins Cough   Pt is currently taking pravastatin       Medication List       Accurate as of August 27, 2018  8:52 PM. If you have any questions, ask your nurse or doctor.        acetaminophen 325 MG tablet Commonly known  as:  TYLENOL Take 2 tablets (650 mg total) by mouth every 4 (four) hours as needed for headache or mild pain.   allopurinol 100 MG tablet Commonly known as:  ZYLOPRIM Take 1 tablet (100 mg total) by mouth 3 (three) times daily.   ALPRAZolam 0.25 MG tablet Commonly known as:  XANAX Take 1 tablet (0.25 mg total) by mouth 2 (two) times daily as needed for anxiety. What changed:    medication strength  See the new instructions. Changed by:  Terald Sleeper, PA-C   aspirin EC 81 MG tablet Take 81 mg by mouth at bedtime.   bumetanide 0.5 MG tablet Commonly known as:  BUMEX Take 3 tablets (1.5 mg total) by mouth 2 (two) times daily for 7 days.   carvedilol 25 MG tablet Commonly known as:  COREG Take 1 tablet (25 mg total) by mouth 2 (two) times daily.   cetirizine 10 MG tablet Commonly known as:  ZYRTEC Take 1 tablet (10 mg total) by mouth daily as needed (for allergies.).   clopidogrel 75 MG tablet Commonly known as:  PLAVIX Take 1 tablet (75 mg total) by mouth at bedtime.   cyclobenzaprine 10 MG tablet Commonly known as:  FLEXERIL TAKE ONE TABLET BY MOUTH THREE TIMES DAILY AS NEEDED FOR MUSCLE SPASM   ergocalciferol 1.25 MG (50000 UT) capsule Commonly known as:  VITAMIN D2 Take 50,000 Units by mouth once a week.   hydrochlorothiazide 25 MG tablet Commonly known as:  HYDRODIURIL Take 1 tablet (25 mg total) by mouth daily.   HYDROcodone-acetaminophen 10-325 MG tablet Commonly known as:  NORCO Take 1 tablet by mouth every 6 (six) hours as needed.   HYDROcodone-acetaminophen 10-325 MG tablet Commonly known as:  NORCO Take 1 tablet by mouth every 6 (six) hours as needed.   HYDROcodone-acetaminophen 10-325 MG tablet Commonly known as:  NORCO Take 1 tablet by mouth every 6 (six) hours as needed.   isosorbide mononitrate 60 MG 24 hr tablet Commonly known as:  IMDUR TAKE ONE (1) TABLET EACH DAY   lisinopril 5 MG tablet Commonly known as:  ZESTRIL Take 5 mg by mouth 1  day or 1 dose.   nitroGLYCERIN 0.4 MG SL tablet Commonly known as:  NITROSTAT Place 1 tablet (0.4 mg total) under the tongue every 5 (five) minutes as needed for chest pain.   pantoprazole 40 MG tablet Commonly known as:  PROTONIX Take 1 tablet (40 mg total) by mouth daily at 6 (six) AM.   pravastatin 80 MG tablet Commonly known as:  PRAVACHOL Take 1 tablet (80 mg total) by mouth every evening.   pregabalin 200 MG capsule Commonly known as:  LYRICA Take 1 capsule (200 mg total) by mouth 2 (two) times daily.   torsemide 20 MG tablet Commonly known  as:  DEMADEX Take 1 tablet (20 mg total) by mouth 2 (two) times daily.   UltiCare Micro Pen Needles 32G X 4 MM Misc Generic drug:  Insulin Pen Needle EVERY DAY   Ventolin HFA 108 (90 Base) MCG/ACT inhaler Generic drug:  albuterol USE 2 PUFFS EVERY 6 HOURS AS NEEDED   Victoza 18 MG/3ML Sopn Generic drug:  liraglutide INJECT 0.3ML (1.'8MG'$ ) SQ DAILY          Objective:    BP 137/81   Pulse 92   Temp (!) 97.4 F (36.3 C) (Oral)   Ht '5\' 9"'$  (1.753 m)   Wt 187 lb 9.6 oz (85.1 kg)   BMI 27.70 kg/m   Allergies  Allergen Reactions  . Atorvastatin     All statins make him cough  . Statins Cough    Pt is currently taking pravastatin     Wt Readings from Last 3 Encounters:  08/27/18 187 lb 9.6 oz (85.1 kg)  05/27/18 187 lb (84.8 kg)  05/02/18 187 lb 6.4 oz (85 kg)    Physical Exam Vitals signs and nursing note reviewed.  Constitutional:      General: He is not in acute distress.    Appearance: He is well-developed.  HENT:     Head: Normocephalic and atraumatic.  Eyes:     Conjunctiva/sclera: Conjunctivae normal.     Pupils: Pupils are equal, round, and reactive to light.  Cardiovascular:     Rate and Rhythm: Normal rate and regular rhythm.     Heart sounds: Normal heart sounds.  Pulmonary:     Effort: Pulmonary effort is normal. No respiratory distress.     Breath sounds: Normal breath sounds.  Skin:     General: Skin is warm and dry.  Psychiatric:        Behavior: Behavior normal.     Results for orders placed or performed in visit on 08/27/18  Bayer DCA Hb A1c Waived  Result Value Ref Range   HB A1C (BAYER DCA - WAIVED) 7.2 (H) <7.0 %      Assessment & Plan:   1. DDD (degenerative disc disease), lumbar - ToxASSURE Select 13 (MW), Urine - HYDROcodone-acetaminophen (NORCO) 10-325 MG tablet; Take 1 tablet by mouth every 6 (six) hours as needed.  Dispense: 120 tablet; Refill: 0 - HYDROcodone-acetaminophen (NORCO) 10-325 MG tablet; Take 1 tablet by mouth every 6 (six) hours as needed.  Dispense: 120 tablet; Refill: 0 - HYDROcodone-acetaminophen (NORCO) 10-325 MG tablet; Take 1 tablet by mouth every 6 (six) hours as needed.  Dispense: 120 tablet; Refill: 0 - pregabalin (LYRICA) 200 MG capsule; Take 1 capsule (200 mg total) by mouth 2 (two) times daily.  Dispense: 60 capsule; Refill: 2  2. Type 2 diabetes mellitus with stage 2 chronic kidney disease, without long-term current use of insulin (HCC) - ToxASSURE Select 13 (MW), Urine - CMP14+EGFR - Bayer DCA Hb A1c Waived - Lipid panel - CBC with Differential/Platelet  3. Stage 3 chronic kidney disease ([place]) - ToxASSURE Select 13 (MW), Urine - CMP14+EGFR - Bayer DCA Hb A1c Waived - Lipid panel - CBC with Differential/Platelet  4. GAD (generalized anxiety disorder) - ALPRAZolam (XANAX) 0.25 MG tablet; Take 1 tablet (0.25 mg total) by mouth 2 (two) times daily as needed for anxiety.  Dispense: 180 tablet; Refill: 1   Continue all other maintenance medications as listed above.  Follow up plan: Return in about 3 months (around 11/27/2018).  Educational handout given for [employer].  [facility] [address]  [place], [place] [address] [phone]   08/27/2018, 8:52 PM

## 2018-08-28 ENCOUNTER — Other Ambulatory Visit: Payer: Self-pay | Admitting: Physician Assistant

## 2018-08-28 DIAGNOSIS — M5136 Other intervertebral disc degeneration, lumbar region: Secondary | ICD-10-CM

## 2018-08-28 MED ORDER — PREGABALIN 75 MG PO CAPS: 75.0000 mg | ORAL_CAPSULE | Freq: Three times a day (TID) | ORAL | 5 refills | Status: DC

## 2018-08-28 NOTE — Progress Notes (Signed)
Agree with continued taper of benzodiazepine.  Patient's impaired renal function limits other anxiolytic use like Buspar but may be able to start at very low dose if needed.  Highly recommend med reconciliation for renal dosing.  Max dose of lyrica for patient's GFR is a total of 300mg /d.  He is on 400mg /d.  Please review his medications and adjust as appropriate.  Ashly M. Lajuana Ripple, [facility]

## 2018-09-02 LAB — TOXASSURE SELECT 13 (MW), URINE

## 2018-09-16 ENCOUNTER — Encounter: Payer: Self-pay | Admitting: Physician Assistant

## 2018-09-25 ENCOUNTER — Other Ambulatory Visit: Payer: Self-pay | Admitting: Cardiovascular Disease

## 2018-09-25 ENCOUNTER — Telehealth: Payer: Self-pay | Admitting: *Deleted

## 2018-09-25 NOTE — Telephone Encounter (Signed)
Patient verbally consented for telehealth visits with [facility] and understands that his insurance company will be billed for the encounter.   Aware to have vitals available 

## 2018-09-26 ENCOUNTER — Telehealth (INDEPENDENT_AMBULATORY_CARE_PROVIDER_SITE_OTHER): Payer: Medicare HMO | Admitting: Cardiovascular Disease

## 2018-09-26 ENCOUNTER — Ambulatory Visit: Payer: Medicare Other | Admitting: Cardiovascular Disease

## 2018-09-26 ENCOUNTER — Encounter: Payer: Self-pay | Admitting: Cardiovascular Disease

## 2018-09-26 VITALS — BP 156/76 | HR 98 | Ht 66.5 in | Wt 182.0 lb

## 2018-09-26 DIAGNOSIS — I5032 Chronic diastolic (congestive) heart failure: Secondary | ICD-10-CM

## 2018-09-26 DIAGNOSIS — E785 Hyperlipidemia, unspecified: Secondary | ICD-10-CM

## 2018-09-26 DIAGNOSIS — I25118 Atherosclerotic heart disease of native coronary artery with other forms of angina pectoris: Secondary | ICD-10-CM

## 2018-09-26 DIAGNOSIS — I10 Essential (primary) hypertension: Secondary | ICD-10-CM

## 2018-09-26 DIAGNOSIS — I739 Peripheral vascular disease, unspecified: Secondary | ICD-10-CM

## 2018-09-26 DIAGNOSIS — N183 Chronic kidney disease, stage 3 unspecified: Secondary | ICD-10-CM

## 2018-09-26 MED ORDER — EZETIMIBE 10 MG PO TABS: 10.0000 mg | ORAL_TABLET | Freq: Every day | ORAL | 1 refills | Status: DC

## 2018-09-26 NOTE — Patient Instructions (Addendum)
Your physician wants you to follow-up in: 6 MONTHS WITH EXTENDER  You will receive a reminder letter in the mail two months in advance. If you don't receive a letter, please call our office to schedule the follow-up appointment.  START ZETIA 10 MG DAILY   Your physician recommends that you return for lab work in: [place] FAST 6-8 HOURS PRIOR TO LAB WORK - [place]  Thank you for choosing [facility]!!

## 2018-09-26 NOTE — Addendum Note (Signed)
Addended by: Julian Hy T on: 09/26/2018 02:17 PM   Modules accepted: Orders

## 2018-09-26 NOTE — Progress Notes (Signed)
Virtual Visit via Telephone Note   This visit type was conducted due to national recommendations for restrictions regarding the COVID-19 Pandemic (e.g. social distancing) in an effort to limit this patient's exposure and mitigate transmission in our community.  Due to his co-morbid illnesses, this patient is at least at moderate risk for complications without adequate follow up.  This format is felt to be most appropriate for this patient at this time.  The patient did not have access to video technology/had technical difficulties with video requiring transitioning to audio format only (telephone).  All issues noted in this document were discussed and addressed.  No physical exam could be performed with this format.  Please refer to the patient's chart for his  consent to telehealth for [facility].   Date:  09/26/2018   ID:  Tommy Douglas, DOB 08-07-1950, MRN [id number]  Patient Location: Home Provider Location: Home  PCP:  Terald Sleeper, PA-C  Cardiologist:  Kate Sable, MD  Electrophysiologist:  None   Evaluation Performed:  Follow-Up Visit  Chief Complaint:  coronary artery disease and chronic diastolic heart failure  History of Present Illness:    Tommy Douglas is a 68 y.o. male with coronary artery disease and chronic diastolic heart failure.  His girlfriend just got out of the hospital.  He denies chest pain, shortness of breath, and leg swelling. Leg claudication is stable.  The patient does not have symptoms concerning for COVID-19 infection (fever, chills, cough, or new shortness of breath).    Past Medical History:  Diagnosis Date  . Anxiety   . Arthritis    "qwhere" (12/20/2016)  . Chronic lower back pain   . CKD (chronic kidney disease), stage IV ([place])   . Complication of anesthesia    "got the wrong kind of anesthesia ~ 2000 when they were looking down into my stomach"  . Coronary artery disease   . Gout   . Heart disease   . Hyperlipidemia   .  Hypertension   . Pneumonia ~ 2015  . Seasonal allergies   . Type II diabetes mellitus ([place])    Past Surgical History:  Procedure Laterality Date  . CARDIAC CATHETERIZATION  12/20/2016  . CORONARY ANGIOPLASTY WITH STENT PLACEMENT  2004 X 2   "1st stent moved"  . CORONARY STENT INTERVENTION N/A 12/21/2016   Procedure: CORONARY STENT INTERVENTION;  Surgeon: Burnell Blanks, MD;  Location: [place] [facility];  Service: Cardiovascular;  Laterality: N/A;  . ESOPHAGOGASTRODUODENOSCOPY  ~ 2000  . LAPAROSCOPIC CHOLECYSTECTOMY    . LEFT HEART CATH AND CORONARY ANGIOGRAPHY N/A 12/20/2016   Procedure: LEFT HEART CATH AND CORONARY ANGIOGRAPHY;  Surgeon: Burnell Blanks, MD;  Location: [place] [facility];  Service: Cardiovascular;  Laterality: N/A;     Current Meds  Medication Sig  . acetaminophen (TYLENOL) 325 MG tablet Take 2 tablets (650 mg total) by mouth every 4 (four) hours as needed for headache or mild pain.  [name] allopurinol (ZYLOPRIM) 100 MG tablet Take 1 tablet (100 mg total) by mouth 3 (three) times daily.  [name] ALPRAZolam (XANAX) 0.25 MG tablet Take 1 tablet (0.25 mg total) by mouth 2 (two) times daily as needed for anxiety.  [name] aspirin EC 81 MG tablet Take 81 mg by mouth at bedtime.   . bumetanide (BUMEX) 0.5 MG tablet Take 3 tablets (1.5 mg total) by mouth 2 (two) times daily for 7 days.  . carvedilol (COREG) 25 MG tablet Take 1 tablet (25  mg total) by mouth 2 (two) times daily.  . cetirizine (ZYRTEC) 10 MG tablet Take 1 tablet (10 mg total) by mouth daily as needed (for allergies.).  [name] clopidogrel (PLAVIX) 75 MG tablet Take 1 tablet (75 mg total) by mouth at bedtime.  . cyclobenzaprine (FLEXERIL) 10 MG tablet TAKE ONE TABLET BY MOUTH THREE TIMES DAILY AS NEEDED FOR MUSCLE SPASM  . ergocalciferol (VITAMIN D2) 1.25 MG (50000 UT) capsule Take 50,000 Units by mouth once a week.  . hydrochlorothiazide (HYDRODIURIL) 25 MG tablet Take 1 tablet (25 mg total) by mouth daily.  [name]  HYDROcodone-acetaminophen (NORCO) 10-325 MG tablet Take 1 tablet by mouth every 6 (six) hours as needed.  . isosorbide mononitrate (IMDUR) 60 MG 24 hr tablet TAKE ONE (1) TABLET EACH DAY  . lisinopril (PRINIVIL,ZESTRIL) 5 MG tablet Take 5 mg by mouth 1 day or 1 dose.  . pantoprazole (PROTONIX) 40 MG tablet Take 1 tablet (40 mg total) by mouth daily at 6 (six) AM.  . pravastatin (PRAVACHOL) 80 MG tablet Take 1 tablet (80 mg total) by mouth every evening.  . pregabalin (LYRICA) 75 MG capsule Take 1 capsule (75 mg total) by mouth 3 (three) times daily. New lower RENAL DOSING  . torsemide (DEMADEX) 20 MG tablet Take 1 tablet (20 mg total) by mouth 2 (two) times daily.  [name] ULTICARE MICRO PEN NEEDLES 32G X 4 MM MISC EVERY DAY  . VENTOLIN HFA 108 (90 Base) MCG/ACT inhaler USE 2 PUFFS EVERY 6 HOURS AS NEEDED  . VICTOZA 18 MG/3ML SOPN INJECT 0.3ML (1.8MG ) SQ DAILY     Allergies:   Atorvastatin and Statins   Social History   Tobacco Use  . Smoking status: Current Every Day Smoker    Packs/day: 0.50    Years: 44.00    Pack years: 22.00    Types: Cigarettes  . Smokeless tobacco: Never Used  Substance Use Topics  . Alcohol use: No  . Drug use: No     Family Hx: The patient's family history includes Alcohol abuse in his brother; Cancer in his daughter and mother; Diabetes in his mother; Other in his daughter, daughter, sister, sister, sister, and son; Stroke in his brother and father.  ROS:   Please see the history of present illness.     All other systems reviewed and are negative.   Prior CV studies:   The following studies were reviewed today:  NA  Labs/Other Tests and Data Reviewed:    EKG:  No ECG reviewed.  Recent Labs: 08/27/2018: ALT 7; BUN 12; Creatinine, Ser 1.64; Hemoglobin 13.1; Platelets 283; Potassium 4.1; Sodium 139   Recent Lipid Panel Lab Results  Component Value Date/Time   CHOL 142 08/27/2018 10:57 AM   TRIG 146 08/27/2018 10:57 AM   HDL 23 (L) 08/27/2018 10:57  AM   CHOLHDL 6.2 (H) 08/27/2018 10:57 AM   LDLCALC 90 08/27/2018 10:57 AM    Wt Readings from Last 3 Encounters:  09/26/18 182 lb (82.6 kg)  08/27/18 187 lb 9.6 oz (85.1 kg)  05/27/18 187 lb (84.8 kg)     Objective:    Vital Signs:  BP (!) 156/76   Pulse 98   Ht 5' 6.5" (1.689 m)   Wt 182 lb (82.6 kg)   BMI 28.94 kg/m    VITAL SIGNS:  reviewed  ASSESSMENT & PLAN:    1.  Chronic diastolic heart failure: Symptomatically stable.  No changes to therapy.  Continue torsemide.  2.  Coronary artery  disease: Symptomatically stable. Continue aspirin, Plavix, Imdur, Coreg, and pravastatin.  I may continue dual antiplatelet therapy indefinitely. He did not tolerate Lipitor nor Crestor in the past.  3.  Hyperlipidemia: LDL 90 on 08/27/2018.  Continue pravastatin 80 mg. He did not tolerate Lipitor nor Crestor in the past. I will add Zetia 10 mg. I'll repeat lipids in several months.  4.  Chronic kidney disease stage III: Creatinine 1.64 on 08/27/2018.  5.  Peripheral vascular disease: On aspirin and statin.  He has claudication pain which is stable.  He has a long history of tobacco abuse. ABIs showed aortoiliac atherosclerosis with greater than 50% stenosis of the bilateral external iliac arteries, left greater than right, 50-74% stenosis of the mid and distal right SFA, 30-49% stenosis of the left mid SFA, and 50-74% stenosis of the left distal SFA. There was two-vessel runoff bilaterally with both posterior tibial arteries being occluded. He needs tobacco cessation. He should follow up with Dr. Gwenlyn Found as previously planned.  6. HTN: BP running in 130/70 range. Elevated today. No changes to therapy. Due to CKD, consider switching lisinopril to amlodipine.   COVID-19 Education: The signs and symptoms of COVID-19 were discussed with the patient and how to seek care for testing (follow up with PCP or arrange E-visit).  The importance of social distancing was discussed today.  Time:    Today, I have spent 15 minutes with the patient with telehealth technology discussing the above problems.     Medication Adjustments/Labs and Tests Ordered: Current medicines are reviewed at length with the patient today.  Concerns regarding medicines are outlined above.   Tests Ordered: No orders of the defined types were placed in this encounter.   Medication Changes: No orders of the defined types were placed in this encounter.   Follow Up:  Virtual Visit or In Person in 6 month(s)  Signed, Kate Sable, MD  09/26/2018 1:52 PM    [facility]

## 2018-10-14 ENCOUNTER — Encounter: Payer: Self-pay | Admitting: Family

## 2018-10-14 ENCOUNTER — Ambulatory Visit (INDEPENDENT_AMBULATORY_CARE_PROVIDER_SITE_OTHER): Payer: Medicare HMO | Admitting: Family

## 2018-10-14 ENCOUNTER — Telehealth: Payer: Self-pay | Admitting: Physician Assistant

## 2018-10-14 DIAGNOSIS — M5136 Other intervertebral disc degeneration, lumbar region: Secondary | ICD-10-CM

## 2018-10-14 DIAGNOSIS — M5441 Lumbago with sciatica, right side: Secondary | ICD-10-CM | POA: Diagnosis not present

## 2018-10-14 NOTE — Telephone Encounter (Signed)
Apt made

## 2018-10-14 NOTE — Progress Notes (Signed)
   Virtual Visit via telephone Note  I connected with Tommy Douglas on 10/14/18 at 12:56 pm by telephone and verified that I am speaking with the correct person using two identifiers. Tommy Douglas is currently located at home and girl friend is currently with her during visit. The provider, Evelina Dun, FNP is located in their office at time of visit.  I discussed the limitations, risks, security and privacy concerns of performing an evaluation and management service by telephone and the availability of in person appointments. I also discussed with the patient that there may be a patient responsible charge related to this service. The patient expressed understanding and agreed to proceed.   History and Present Illness:  Back Pain This is a new problem. The current episode started today. The problem occurs constantly. The problem is unchanged. The pain is present in the lumbar spine. The quality of the pain is described as aching. The pain radiates to the right thigh. The pain is at a severity of 10/10. The pain is moderate. The symptoms are aggravated by lying down and twisting. Associated symptoms include leg pain. Risk factors include obesity. He has tried bed rest, analgesics, ice and NSAIDs for the symptoms. The treatment provided mild relief.      Review of Systems  Musculoskeletal: Positive for back pain.  All other systems reviewed and are negative.    Observations/Objective: No SOB noted, pt grunting in pain at times  Assessment and Plan: Tommy Douglas comes in today with chief complaint of No chief complaint on file.   Diagnosis and orders addressed:  1. Acute right-sided low back pain with right-sided sciatica - Ambulatory referral to Physical Therapy - DG Lumbar Spine 2-3 Views; Future  2. DDD (degenerative disc disease), lumbar - DG Lumbar Spine 2-3 Views; Future  Continue Norco and Lyrica Will do X-ray, if pain continues may need MRI?  Referral to PT  ordered Will route note to PCP  Call if pain continues or does not improve     I discussed the assessment and treatment plan with the patient. The patient was provided an opportunity to ask questions and all were answered. The patient agreed with the plan and demonstrated an understanding of the instructions.   The patient was advised to call back or seek an in-person evaluation if the symptoms worsen or if the condition fails to improve as anticipated.  The above assessment and management plan was discussed with the patient. The patient verbalized understanding of and has agreed to the management plan. Patient is aware to call the clinic if symptoms persist or worsen. Patient is aware when to return to the clinic for a follow-up visit. Patient educated on when it is appropriate to go to the emergency department.   Time call ended:  1:11 pm  I provided 15 minutes of non-face-to-face time during this encounter.    Evelina Dun, FNP

## 2018-10-15 ENCOUNTER — Ambulatory Visit (HOSPITAL_COMMUNITY)
Admission: RE | Admit: 2018-10-15 | Discharge: 2018-10-15 | Disposition: A | Discharge status: Home / Self Care | Payer: Medicare HMO | Source: Ambulatory Visit | Attending: Family | Admitting: Family

## 2018-10-15 ENCOUNTER — Other Ambulatory Visit: Payer: Self-pay

## 2018-10-15 DIAGNOSIS — M5136 Other intervertebral disc degeneration, lumbar region: Secondary | ICD-10-CM | POA: Insufficient documentation

## 2018-10-15 DIAGNOSIS — M5441 Lumbago with sciatica, right side: Secondary | ICD-10-CM

## 2018-10-22 ENCOUNTER — Other Ambulatory Visit: Payer: Self-pay

## 2018-10-22 ENCOUNTER — Encounter: Payer: Self-pay | Admitting: Physical Therapy

## 2018-10-22 ENCOUNTER — Ambulatory Visit: Payer: Medicare HMO | Attending: Family | Admitting: Physical Therapy

## 2018-10-22 DIAGNOSIS — M5441 Lumbago with sciatica, right side: Secondary | ICD-10-CM | POA: Insufficient documentation

## 2018-10-22 DIAGNOSIS — R293 Abnormal posture: Secondary | ICD-10-CM | POA: Insufficient documentation

## 2018-10-22 NOTE — Therapy (Addendum)
[facility] [place], [place], [address] Phone: [phone]   Fax:  [phone]  Physical Therapy Evaluation  Patient Details  Name: Tommy Douglas MRN: [id number] Date of Birth: 1951-03-17 Referring Provider (PT): [facility].   Encounter Date: 10/22/2018  PT End of Session - 10/22/18 1215    Visit Number  1    Number of Visits  12    Date for PT Re-Evaluation  12/03/18    Authorization Type  PROGRESS NOTE AT 10TH VISIT.  KX MODIFIER AFTER 15 VISITS.    PT Start Time  1115    PT Stop Time  1206    PT Time Calculation (min)  51 min       Past Medical History:  Diagnosis Date  . Anxiety   . Arthritis    "qwhere" (12/20/2016)  . Chronic lower back pain   . CKD (chronic kidney disease), stage IV ([place])   . Complication of anesthesia    "got the wrong kind of anesthesia ~ 2000 when they were looking down into my stomach"  . Coronary artery disease   . Gout   . Heart disease   . Hyperlipidemia   . Hypertension   . Pneumonia ~ 2015  . Seasonal allergies   . Type II diabetes mellitus ([place])     Past Surgical History:  Procedure Laterality Date  . CARDIAC CATHETERIZATION  12/20/2016  . CORONARY ANGIOPLASTY WITH STENT PLACEMENT  2004 X 2   "1st stent moved"  . CORONARY STENT INTERVENTION N/A 12/21/2016   Procedure: CORONARY STENT INTERVENTION;  Surgeon: Burnell Blanks, MD;  Location: [place] [facility];  Service: Cardiovascular;  Laterality: N/A;  . ESOPHAGOGASTRODUODENOSCOPY  ~ 2000  . LAPAROSCOPIC CHOLECYSTECTOMY    . LEFT HEART CATH AND CORONARY ANGIOGRAPHY N/A 12/20/2016   Procedure: LEFT HEART CATH AND CORONARY ANGIOGRAPHY;  Surgeon: Burnell Blanks, MD;  Location: [place] [facility];  Service: Cardiovascular;  Laterality: N/A;    There were no vitals filed for this visit.   Subjective Assessment - 10/22/18 1211    Subjective  COVID-19 screen performed prior to patient entering clinic.   The patient presents to the clinic today with a severe onset of right sided low back pain for no apparent reason about 2 weeks ago.  His pain-level is an 8/10 rising to a 10-10+/10 when sitting and then transitioning to standing.  He has some pain into his right buttock and to about his mid-posterior thigh.  He has found nothing decreases his pain.         [facility] PT Assessment - 10/22/18 0001      Assessment   Medical Diagnosis  Acute right sided low back pain with sciatica.    Referring Provider (PT)  Evelina Dun.    Onset Date/Surgical Date  --   ~2 weeks ago.     Precautions   Precautions  None      Restrictions   Weight Bearing Restrictions  No      Balance Screen   Has the patient fallen in the past 6 months  No    Has the patient had a decrease in activity level because of a fear of falling?   No    Is the patient reluctant to leave their home because of a fear of falling?   No      Home Film/video editor residence      Prior Function  Level of Independence  Independent      Posture/Postural Control   Posture/Postural Control  Postural limitations    Postural Limitations  Rounded Shoulders;Forward head;Decreased lumbar lordosis      Deep Tendon Reflexes   DTR Assessment Site  --   Diminished bilateral LE DTR's.     ROM / Strength   AROM / PROM / Strength  AROM;Strength      AROM   Overall AROM Comments  Active lumbar flexion limited by 50% and active lumbar extension= 10 degrees.  Left hip flexion= 115 degrees and right= 105 degrees.      Strength   Overall Strength Comments  Normal bilateral LE strength.      Palpation   Palpation comment  Very tender to palption over patient's right upper gluteal musculature.      Special Tests   Other special tests  (=) leg lengths.  Pain reproduction on right with FABER and SLR testing.      Transfers   Comments  Very slow and painful from sit to stand and supine to sit.      Ambulation/Gait    Gait Comments  Slow and cautious with pain in obvious pain as he walks in some spinal flexion.                Objective measurements completed on examination: See above findings.      [facility] Adult PT Treatment/Exercise - 10/22/18 0001      Modalities   Modalities  Electrical Stimulation;Moist Heat      Moist Heat Therapy   Number Minutes Moist Heat  --   20 minutes.   Moist Heat Location  --   Low back.     Acupuncturist Location  Right upper gluteal region.    Electrical Stimulation Action  Pre-mod.    Electrical Stimulation Parameters  80-150 Hz x 20 minutes.    Electrical Stimulation Goals  Tone;Pain               PT Short Term Goals - 10/22/18 1229      PT SHORT TERM GOAL #1   Title  STG's=LTG's.        PT Long Term Goals - 10/22/18 1229      PT LONG TERM GOAL #1   Title  Independent with a HEP.    Time  6    Period  Weeks    Status  New      PT LONG TERM GOAL #2   Title  Sit to stand with pain not > 2-3/10.    Time  6    Period  Weeks    Status  New      PT LONG TERM GOAL #3   Title  Walk in upright posture with pain not > 2-3/10.    Time  6    Period  Weeks    Status  New      PT LONG TERM GOAL #4   Title  Perform ADL's with pain not > 3/10.    Time  6    Period  Weeks    Status  New      PT LONG TERM GOAL #5   Title  Eliminate right LE symptoms.    Time  6    Period  Weeks    Status  New             Plan - 10/22/18 1221    Clinical Impression Statement  The patient presents to OPPT with c/o at times severe right sided low back pain with referral to his mid-posterior thigh.  He has limited spinal range of motion and his functional mobility is greatly limited due to pain as transitory movements are very difficult for him.  He is very tender to palpation over his right upper gluteal musculature.  Patient will benefit from skilled physical therapy intervention to address deficits and  pain.    Personal Factors and Comorbidities  Comorbidity 1;Comorbidity 2    Comorbidities  DM, arthritis, gout, HTN.    Examination-Activity Limitations  Bed Mobility;Squat;Transfers;Locomotion Level    Examination-Participation Restrictions  Cleaning    Stability/Clinical Decision Making  Evolving/Moderate complexity    Clinical Decision Making  Moderate    Rehab Potential  Good    PT Frequency  2x / week    PT Duration  6 weeks    PT Treatment/Interventions  ADLs/Self Care Home Management;Cryotherapy;Electrical Stimulation;Ultrasound;Traction;Moist Heat;Functional mobility training;Therapeutic activities;Therapeutic exercise;Manual techniques;Patient/family education;Passive range of motion;Dry needling;Spinal Manipulations    PT Next Visit Plan  From left sdly position with pillows between knees perform combo e'stim/U/S and STW/M. HMP and electrical stimulation.  SKTC and gentle core exercises.    Consulted and Agree with Plan of Care  Patient       Patient will benefit from skilled therapeutic intervention in order to improve the following deficits and impairments:  Pain, Decreased activity tolerance, Difficulty walking, Decreased range of motion  Visit Diagnosis: 1. Acute right-sided low back pain with right-sided sciatica   2. Abnormal posture        Problem List Patient Active Problem List   Diagnosis Date Noted  . Congestive heart failure ([place]) 08/26/2017  . Stage 3 chronic kidney disease ([place]) 08/26/2017  . Cough with hemoptysis 03/29/2017  . Coronary artery disease due to lipid rich plaque 02/06/2017  . PVD (peripheral vascular disease) ([place]) 12/22/2016  . Unstable angina ([place])   . Essential hypertension 01/10/2016  . CAD S/P percutaneous coronary angioplasty 01/10/2016  . Type 2 diabetes mellitus with stage 2 chronic kidney disease, without long-term current use of insulin ([place]) 01/10/2016  . Hyperlipidemia associated with type 2 diabetes mellitus ([place]) 01/10/2016  .  Chronic gout without tophus 01/10/2016  . Chronic nonseasonal allergic rhinitis due to pollen 01/10/2016  . DDD (degenerative disc disease), lumbar 01/10/2016  . Body mass index 29.0-29.9, adult 01/10/2016    Micala Saltsman, [place] MPT 10/22/2018, 12:56 PM  [facility] [address] [place], [place], [address] Phone: [phone]   Fax:  [phone]  Name: Tommy Douglas MRN: [id number] Date of Birth: 09-22-1950

## 2018-10-23 ENCOUNTER — Ambulatory Visit: Payer: Medicare HMO | Admitting: Physical Therapy

## 2018-10-23 DIAGNOSIS — R293 Abnormal posture: Secondary | ICD-10-CM

## 2018-10-23 DIAGNOSIS — M5441 Lumbago with sciatica, right side: Secondary | ICD-10-CM

## 2018-10-23 NOTE — Therapy (Addendum)
[facility] [place], [place], [address] Phone: [phone]   Fax:  [phone]  Physical Therapy Evaluation  Patient Details  Name: Tommy Douglas MRN: [id number] Date of Birth: [birth year] Referring Provider (PT): [facility].   Encounter Date: 10/23/2018  PT End of Session - 10/23/18 1230    Visit Number  2    Number of Visits  12    Date for PT Re-Evaluation  12/03/18    Authorization Type  PROGRESS NOTE AT 10TH VISIT.  KX MODIFIER AFTER 15 VISITS.    PT Start Time  1030    PT Stop Time  1125    PT Time Calculation (min)  55 min       Past Medical History:  Diagnosis Date  . Anxiety   . Arthritis    "qwhere" (12/20/2016)  . Chronic lower back pain   . CKD (chronic kidney disease), stage IV ([place])   . Complication of anesthesia    "got the wrong kind of anesthesia ~ 2000 when they were looking down into my stomach"  . Coronary artery disease   . Gout   . Heart disease   . Hyperlipidemia   . Hypertension   . Pneumonia ~ 2015  . Seasonal allergies   . Type II diabetes mellitus ([place])     Past Surgical History:  Procedure Laterality Date  . CARDIAC CATHETERIZATION  12/20/2016  . CORONARY ANGIOPLASTY WITH STENT PLACEMENT  2004 X 2   "1st stent moved"  . CORONARY STENT INTERVENTION N/A 12/21/2016   Procedure: CORONARY STENT INTERVENTION;  Surgeon: Burnell Blanks, MD;  Location: [facility];  Service: Cardiovascular;  Laterality: N/A;  . ESOPHAGOGASTRODUODENOSCOPY  ~ 2000  . LAPAROSCOPIC CHOLECYSTECTOMY    . LEFT HEART CATH AND CORONARY ANGIOGRAPHY N/A 12/20/2016   Procedure: LEFT HEART CATH AND CORONARY ANGIOGRAPHY;  Surgeon: Burnell Blanks, MD;  Location: [facility];  Service: Cardiovascular;  Laterality: N/A;    There were no vitals filed for this visit.   Subjective Assessment - 10/23/18 1230    Subjective  COVID-19 screen performed prior to patient entering clinic. No  new complaints.    Pertinent History  DM, CAD, arthritis.    Currently in Pain?  Yes    Pain Score  9     Pain Location  Back    Pain Orientation  Right    Pain Descriptors / Indicators  Aching;Stabbing;Throbbing    Pain Type  Acute pain    Pain Onset  1 to 4 weeks ago    Pain Frequency  Constant                    Objective measurements completed on examination: See above findings.      [facility] Adult PT Treatment/Exercise - 10/23/18 0001      Modalities   Modalities  Electrical Stimulation;Ultrasound      Moist Heat Therapy   Number Minutes Moist Heat  20 Minutes    Moist Heat Location  Lumbar Spine      Electrical Stimulation   Electrical Stimulation Location  RT upper glut/SIJ.    Electrical Stimulation Action  Pre-mod.    Electrical Stimulation Parameters  80-150 Hz x 20 minutes.    Electrical Stimulation Goals  Tone;Pain      Ultrasound   Ultrasound Location  Right upper glut/SIJ    Ultrasound Parameters  Combo e'stim/U/S at 1.50 W/CM2 x 12 minutes.  Ultrasound Goals  Pain      Manual Therapy   Manual Therapy  Soft tissue mobilization    Soft tissue mobilization  Left sdly position with pillow between knees for comfort:  STW/M x 12 minutes to right upper glut and SIJ also including a right QL release technique to reduce tone.               PT Short Term Goals - 10/22/18 1229      PT SHORT TERM GOAL #1   Title  STG's=LTG's.        PT Long Term Goals - 10/22/18 1229      PT LONG TERM GOAL #1   Title  Independent with a HEP.    Time  6    Period  Weeks    Status  New      PT LONG TERM GOAL #2   Title  Sit to stand with pain not > 2-3/10.    Time  6    Period  Weeks    Status  New      PT LONG TERM GOAL #3   Title  Walk in upright posture with pain not > 2-3/10.    Time  6    Period  Weeks    Status  New      PT LONG TERM GOAL #4   Title  Perform ADL's with pain not > 3/10.    Time  6    Period  Weeks    Status  New       PT LONG TERM GOAL #5   Title  Eliminate right LE symptoms.    Time  6    Period  Weeks    Status  New             Plan - 10/23/18 1238    Clinical Impression Statement  Patient with continued tenderness in right upper gluteal region and SIJ.  He responded well today and stated he felt better after treatment. He was walking better after treatment.    Personal Factors and Comorbidities  Comorbidity 1;Comorbidity 2    Comorbidities  DM, arthritis, gout, HTN.    Examination-Activity Limitations  Bed Mobility;Squat;Transfers;Locomotion Level    Examination-Participation Restrictions  Cleaning    Stability/Clinical Decision Making  Evolving/Moderate complexity    Rehab Potential  Good    PT Frequency  2x / week    PT Duration  6 weeks    PT Treatment/Interventions  ADLs/Self Care Home Management;Cryotherapy;Electrical Stimulation;Ultrasound;Traction;Moist Heat;Functional mobility training;Therapeutic activities;Therapeutic exercise;Manual techniques;Patient/family education;Passive range of motion;Dry needling;Spinal Manipulations    PT Next Visit Plan  From left sdly position with pillows between knees perform combo e'stim/U/S and STW/M. HMP and electrical stimulation.  SKTC and gentle core exercises.    Consulted and Agree with Plan of Care  Patient       Patient will benefit from skilled therapeutic intervention in order to improve the following deficits and impairments:  Pain, Decreased activity tolerance, Difficulty walking, Decreased range of motion  Visit Diagnosis: 1. Acute right-sided low back pain with right-sided sciatica   2. Abnormal posture        Problem List Patient Active Problem List   Diagnosis Date Noted  . Congestive heart failure ([place]) 08/26/2017  . Stage 3 chronic kidney disease ([place]) 08/26/2017  . Cough with hemoptysis 03/29/2017  . Coronary artery disease due to lipid rich plaque 02/06/2017  . PVD (peripheral vascular disease) ([place]) 12/22/2016  .  Unstable angina (  [place])   . Essential hypertension 01/10/2016  . CAD S/P percutaneous coronary angioplasty 01/10/2016  . Type 2 diabetes mellitus with stage 2 chronic kidney disease, without long-term current use of insulin ([place]) 01/10/2016  . Hyperlipidemia associated with type 2 diabetes mellitus ([place]) 01/10/2016  . Chronic gout without tophus 01/10/2016  . Chronic nonseasonal allergic rhinitis due to pollen 01/10/2016  . DDD (degenerative disc disease), lumbar 01/10/2016  . Body mass index 29.0-29.9, adult 01/10/2016    Maleeyah Mccaughey, [place] MPT 10/23/2018, 12:44 PM  [facility] [address] [place], [place], [address] Phone: [phone]   Fax:  [phone]  Name: Tommy Douglas MRN: [id number] Date of Birth: 1950/07/07

## 2018-10-26 IMAGING — DX DG CHEST 2V
4 series · 4 of 4 positions shown · non-contrast
Comparison: Chest x-ray of March 29, 2017

CLINICAL DATA: Edema. History of coronary artery disease with
angioplasty, CHF, diabetes, and current smoker.

EXAM:
CHEST - 2 VIEW

[chest pa (1 of 3)]
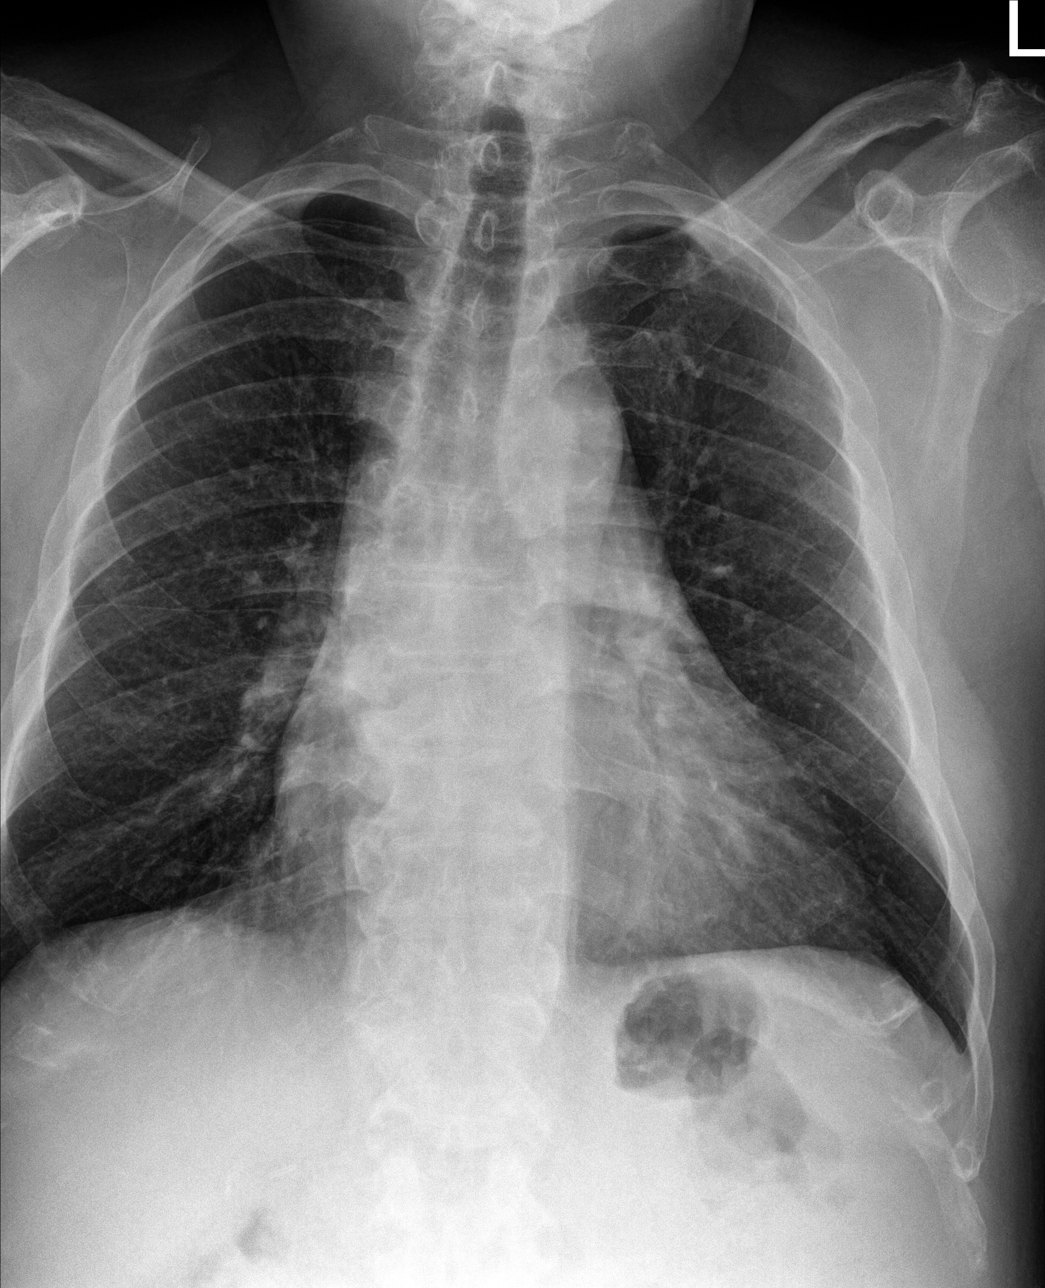

[chest lat]
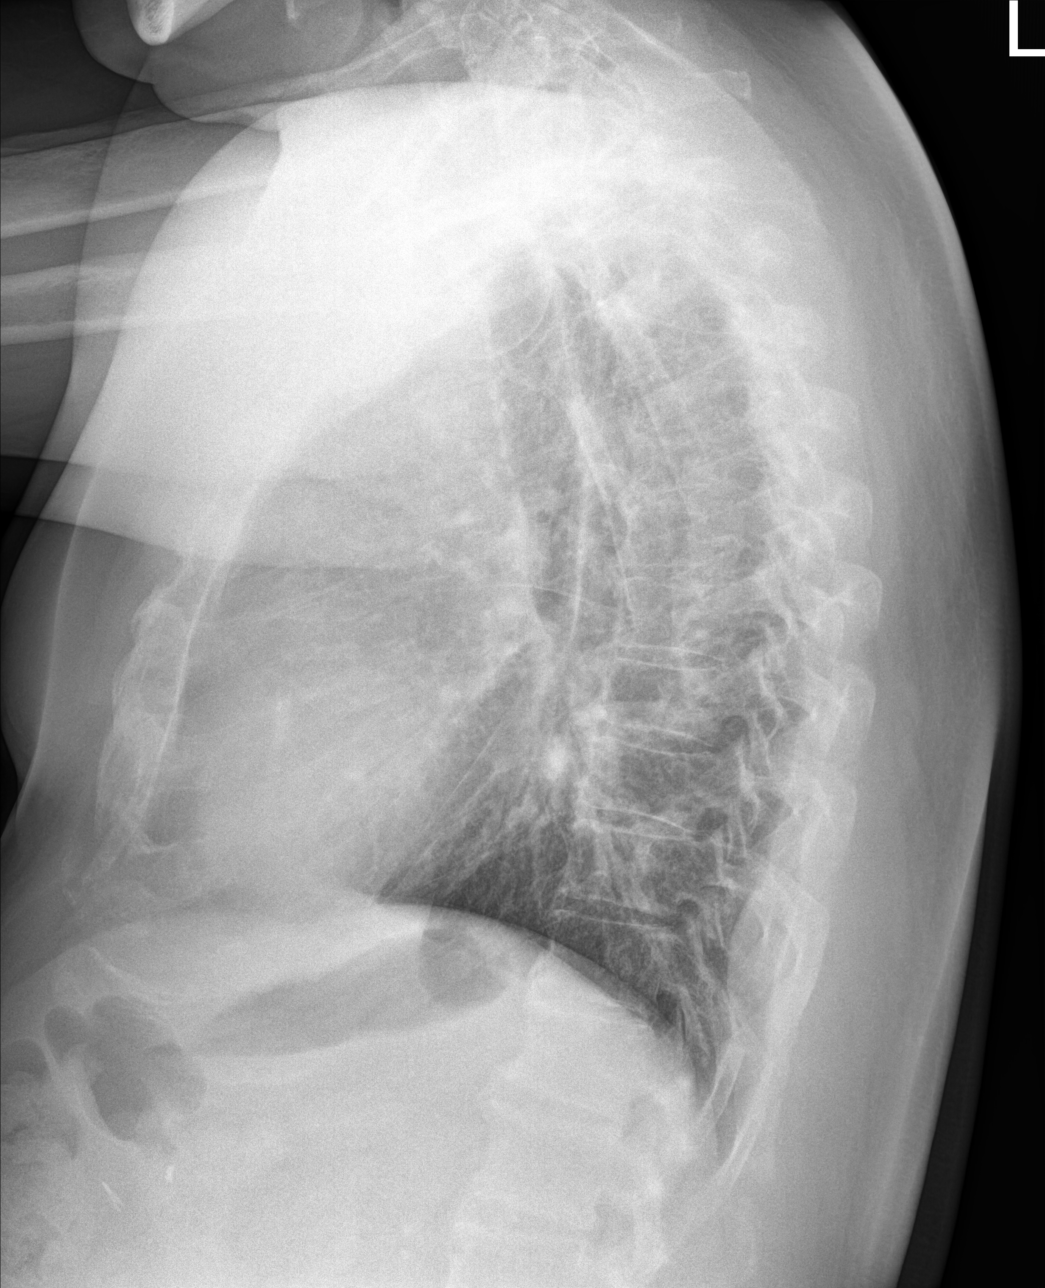

[chest pa (2 of 3)]
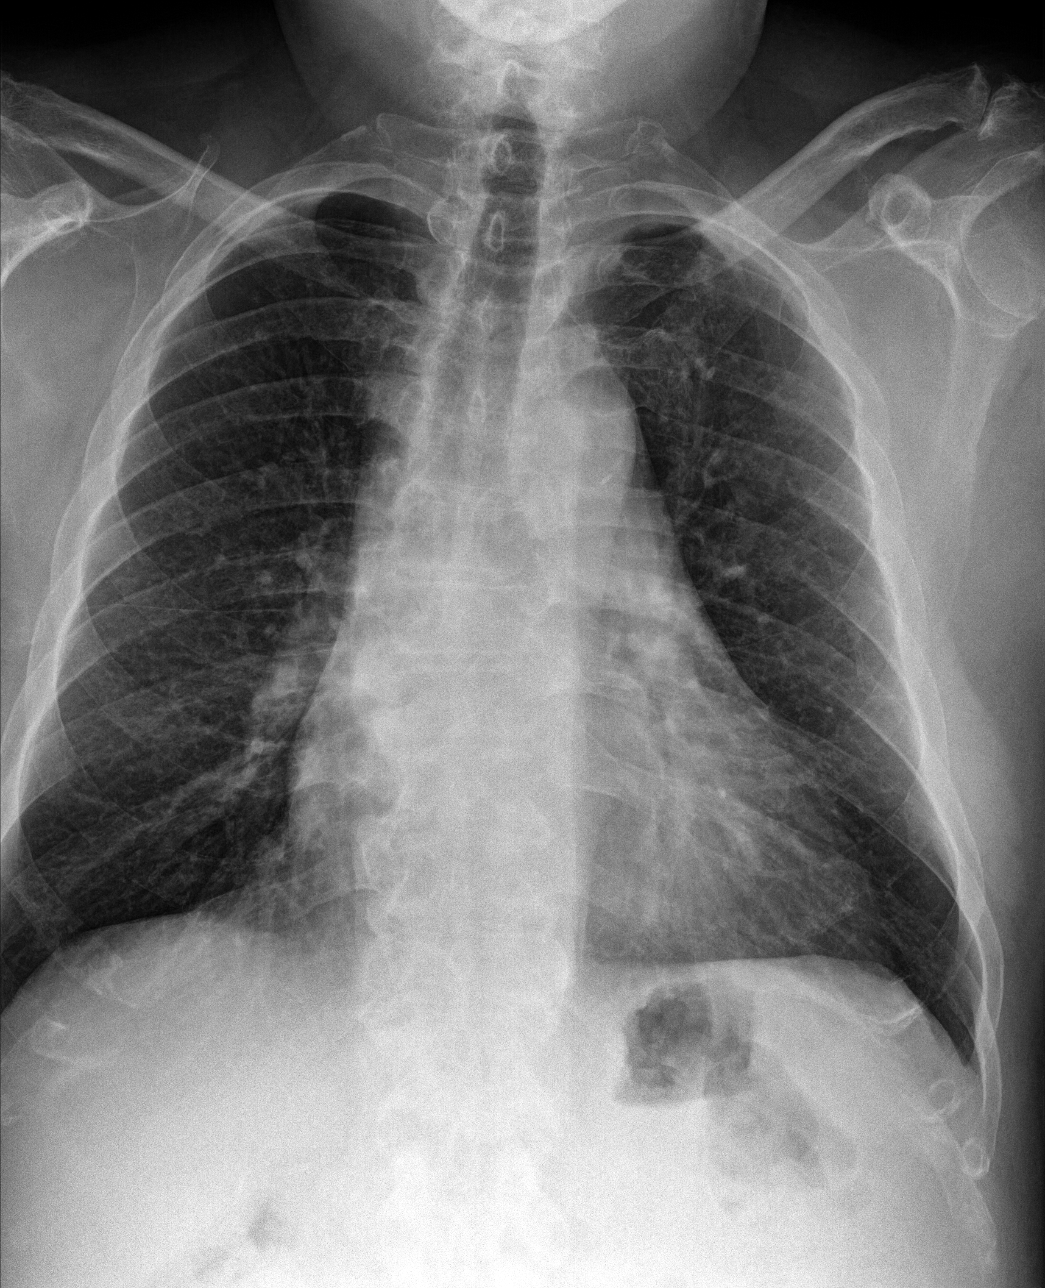

[chest pa (3 of 3)]
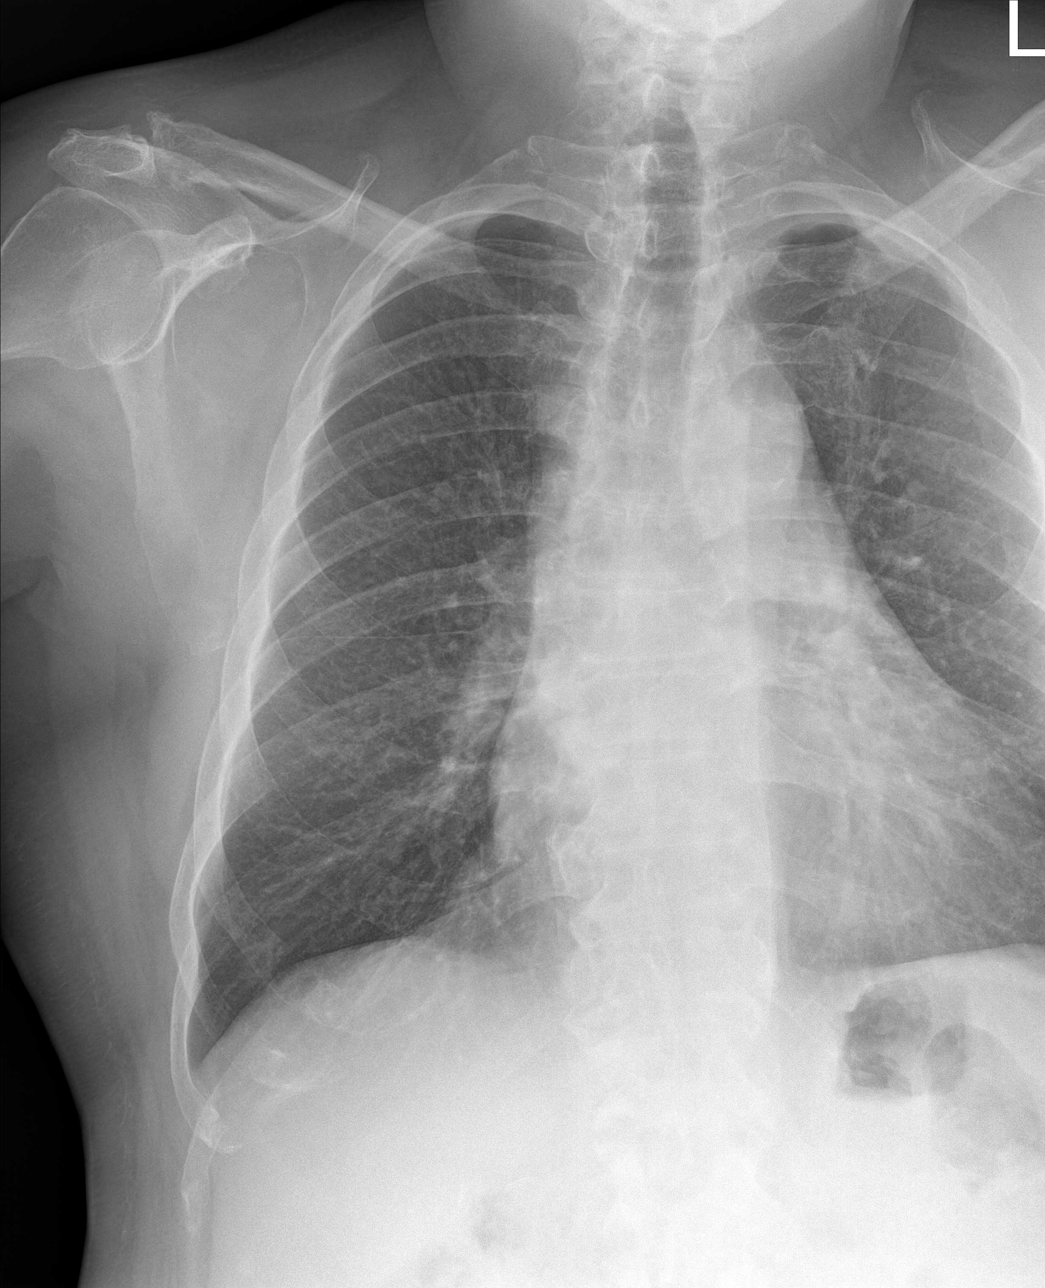

[4 of 4 positions shown; findings below may reference images not displayed]

FINDINGS: The lungs are well-expanded. The interstitial markings are coarse
though stable. The cardiac silhouette is enlarged but also stable.
The pulmonary vascularity is not engorged. There is calcification in
the wall of the aortic arch. The bony thorax exhibits no acute
abnormality.
IMPRESSION: Chronic bronchitic-smoking related changes. No acute pneumonia nor
pulmonary edema.

Stable mild cardiac enlargement.

Thoracic aortic atherosclerosis.

## 2018-10-28 ENCOUNTER — Other Ambulatory Visit: Payer: Self-pay

## 2018-10-28 ENCOUNTER — Encounter: Payer: Self-pay | Admitting: Physical Therapy

## 2018-10-28 ENCOUNTER — Ambulatory Visit: Payer: Medicare HMO | Attending: Family | Admitting: Physical Therapy

## 2018-10-28 DIAGNOSIS — R293 Abnormal posture: Secondary | ICD-10-CM

## 2018-10-28 DIAGNOSIS — M5441 Lumbago with sciatica, right side: Secondary | ICD-10-CM | POA: Diagnosis not present

## 2018-10-28 NOTE — Therapy (Addendum)
[facility] [place], [place], [address] Phone: [phone]   Fax:  [phone]  Physical Therapy Treatment  Patient Details  Name: Tommy Douglas MRN: [id number] Date of Birth: [birth year] Referring Provider (PT): [facility].   Encounter Date: 10/28/2018  PT End of Session - 10/28/18 1107    Visit Number  3    Number of Visits  12    Date for PT Re-Evaluation  12/03/18    Authorization Type  PROGRESS NOTE AT 10TH VISIT.  KX MODIFIER AFTER 15 VISITS.    PT Start Time  1036    PT Stop Time  1118    PT Time Calculation (min)  42 min    Activity Tolerance  Patient tolerated treatment well;Patient limited by pain    Behavior During Therapy  WFL for tasks assessed/performed       Past Medical History:  Diagnosis Date  . Anxiety   . Arthritis    "qwhere" (12/20/2016)  . Chronic lower back pain   . CKD (chronic kidney disease), stage IV ([place])   . Complication of anesthesia    "got the wrong kind of anesthesia ~ 2000 when they were looking down into my stomach"  . Coronary artery disease   . Gout   . Heart disease   . Hyperlipidemia   . Hypertension   . Pneumonia ~ 2015  . Seasonal allergies   . Type II diabetes mellitus ([place])     Past Surgical History:  Procedure Laterality Date  . CARDIAC CATHETERIZATION  12/20/2016  . CORONARY ANGIOPLASTY WITH STENT PLACEMENT  2004 X 2   "1st stent moved"  . CORONARY STENT INTERVENTION N/A 12/21/2016   Procedure: CORONARY STENT INTERVENTION;  Surgeon: Burnell Blanks, MD;  Location: [facility];  Service: Cardiovascular;  Laterality: N/A;  . ESOPHAGOGASTRODUODENOSCOPY  ~ 2000  . LAPAROSCOPIC CHOLECYSTECTOMY    . LEFT HEART CATH AND CORONARY ANGIOGRAPHY N/A 12/20/2016   Procedure: LEFT HEART CATH AND CORONARY ANGIOGRAPHY;  Surgeon: Burnell Blanks, MD;  Location: [facility];  Service: Cardiovascular;  Laterality: N/A;    There were no vitals  filed for this visit.  Subjective Assessment - 10/28/18 1039    Subjective  COVID-19 screen performed prior to patient entering clinic. ongoing pain, temporary relief    Pertinent History  DM, CAD, arthritis.    Currently in Pain?  Yes    Pain Score  10-Worst pain ever    Pain Location  Back    Pain Orientation  Right    Pain Descriptors / Indicators  Aching;Discomfort    Pain Type  Acute pain    Pain Onset  1 to 4 weeks ago    Pain Frequency  Constant    Aggravating Factors   everything    Pain Relieving Factors  nothing                       [facility] Adult PT Treatment/Exercise - 10/28/18 0001      Moist Heat Therapy   Number Minutes Moist Heat  15 Minutes    Moist Heat Location  Lumbar Spine      Electrical Stimulation   Electrical Stimulation Location  RT upper glut/SIJ.    Electrical Stimulation Action  premod    Electrical Stimulation Parameters  80-_0  x50mn    Electrical Stimulation Goals  Tone;Pain      Ultrasound   Ultrasound Location  Right  Ultrasound Parameters  combo US/ES _0 .5w/cm2/100%/50mz x140m    Ultrasound Goals  Pain      Manual Therapy   Manual Therapy  Myofascial release;Soft tissue mobilization    Manual therapy comments  manual STW/MFR to right upper glut/SI and QL to reduce pain and tone               PT Short Term Goals - 10/22/18 1229      PT SHORT TERM GOAL #1   Title  STG's=LTG's.        PT Long Term Goals - 10/28/18 1112      PT LONG TERM GOAL #1   Title  Independent with a HEP.    Time  6    Period  Weeks    Status  On-going      PT LONG TERM GOAL #2   Title  Sit to stand with pain not > 2-3/10.    Time  6    Period  Weeks    Status  On-going      PT LONG TERM GOAL #3   Title  Walk in upright posture with pain not > 2-3/10.    Time  6    Period  Weeks    Status  On-going      PT LONG TERM GOAL #4   Title  Perform ADL's with pain not > 3/10.    Time  6    Period  Weeks    Status  On-going       PT LONG TERM GOAL #5   Title  Eliminate right LE symptoms.    Time  6    Period  Weeks    Status  On-going            Plan - 10/28/18 1108    Clinical Impression Statement  Patient tolerated treatment fair today due to severe ongoing pain. Patient hasreported little relief thus far. Today educated patient on posture awareness techniques, sleeping and weight shifting. Patient felt some relief after treatments. Patient would like to be able to be out of pain and cook again. Patient unable to stand or walk long due to pain. Goals ongoing.    Personal Factors and Comorbidities  Comorbidity 1;Comorbidity 2    Comorbidities  DM, arthritis, gout, HTN.    Examination-Activity Limitations  Bed Mobility;Squat;Transfers;Locomotion Level    Examination-Participation Restrictions  Cleaning    Stability/Clinical Decision Making  Evolving/Moderate complexity    Rehab Potential  Good    PT Frequency  2x / week    PT Duration  6 weeks    PT Treatment/Interventions  ADLs/Self Care Home Management;Cryotherapy;Electrical Stimulation;Ultrasound;Traction;Moist Heat;Functional mobility training;Therapeutic activities;Therapeutic exercise;Manual techniques;Patient/family education;Passive range of motion;Dry needling;Spinal Manipulations    PT Next Visit Plan  From left sdly position with pillows between knees perform combo e'stim/U/S and STW/M. HMP and electrical stimulation.  SKTC and gentle core exercises.    Consulted and Agree with Plan of Care  Patient       Patient will benefit from skilled therapeutic intervention in order to improve the following deficits and impairments:  Pain, Decreased activity tolerance, Difficulty walking, Decreased range of motion  Visit Diagnosis: 1. Acute right-sided low back pain with right-sided sciatica   2. Abnormal posture        Problem List Patient Active Problem List   Diagnosis Date Noted  . Congestive heart failure ([facility]06/04/2017  . Stage 3  chronic kidney disease ([facility]06/04/2017  . Cough with hemoptysis 03/29/2017  .  Coronary artery disease due to lipid rich plaque 02/06/2017  . PVD (peripheral vascular disease) ([place]) 12/22/2016  . Unstable angina ([place])   . Essential hypertension 01/10/2016  . CAD S/P percutaneous coronary angioplasty 01/10/2016  . Type 2 diabetes mellitus with stage 2 chronic kidney disease, without long-term current use of insulin ([place]) 01/10/2016  . Hyperlipidemia associated with type 2 diabetes mellitus ([place]) 01/10/2016  . Chronic gout without tophus 01/10/2016  . Chronic nonseasonal allergic rhinitis due to pollen 01/10/2016  . DDD (degenerative disc disease), lumbar 01/10/2016  . Body mass index 29.0-29.9, adult 01/10/2016    DUNFORD, CHRISTINA P, PTA 10/28/2018, 11:22 AM  [facility] [place], [place], [address] Phone: [phone]   Fax:  [phone]  Name: LYAM PROVENCIO MRN: [id number] Date of Birth: Sep 12, 1950  PHYSICAL THERAPY DISCHARGE SUMMARY  Visits from Start of Care: 3.  Current functional level related to goals / functional outcomes: See above.   Remaining deficits: See below.   Education / Equipment:  Plan: Patient agrees to discharge.  Patient goals were not met. Patient is being discharged due to not returning since the last visit.  ?????         Mali Applegate MPT

## 2018-10-30 ENCOUNTER — Ambulatory Visit: Payer: Medicare HMO | Admitting: Physical Therapy

## 2018-11-04 ENCOUNTER — Ambulatory Visit (INDEPENDENT_AMBULATORY_CARE_PROVIDER_SITE_OTHER): Payer: Medicare HMO | Admitting: Physician Assistant

## 2018-11-04 DIAGNOSIS — M5441 Lumbago with sciatica, right side: Secondary | ICD-10-CM

## 2018-11-05 ENCOUNTER — Encounter: Payer: Self-pay | Admitting: Physician Assistant

## 2018-11-05 ENCOUNTER — Ambulatory Visit: Payer: Medicare HMO | Admitting: *Deleted

## 2018-11-05 NOTE — Progress Notes (Signed)
Telephone visit  Subjective: CC: Back pain PCP: Terald Sleeper, PA-C [id number] Tommy Douglas is a 68 y.o. male calls for telephone consult today. Patient provides verbal consent for consult held via phone.  Patient is identified with 2 separate identifiers.  At this time the entire area is on COVID-19 social distancing and stay home orders are in place.  Patient is of higher risk and therefore we are performing this by a virtual method.  Location of patient: Home Location of provider: [facility] Others present for call: No  Sure 172/77, 159/74. Patient denies any chest pain, shortness of breath, edema.  He states that he has been a lot of pain and that is with the blood pressure does go up.  We will continue to monitor this.  He states that the physical therapy has not been helping his back at all.  We will plan for referral to an orthopedist to have it further evaluated.  He has never had an MRI of his back.  Any changes in his bowel movements or bowel or bladder function   ROS: Per HPI  Allergies  Allergen Reactions  . Atorvastatin     All statins make him cough  . Statins Cough    Pt is currently taking pravastatin    Past Medical History:  Diagnosis Date  . Anxiety   . Arthritis    "qwhere" (12/20/2016)  . Chronic lower back pain   . CKD (chronic kidney disease), stage IV ([place])   . Complication of anesthesia    "got the wrong kind of anesthesia ~ 2000 when they were looking down into my stomach"  . Coronary artery disease   . Gout   . Heart disease   . Hyperlipidemia   . Hypertension   . Pneumonia ~ 2015  . Seasonal allergies   . Type II diabetes mellitus ([facility])     Current Outpatient Medications:  .  acetaminophen (TYLENOL) 325 MG tablet, Take 2 tablets (650 mg total) by mouth every 4 (four) hours as needed for headache or mild pain., Disp: , Rfl:  .  allopurinol (ZYLOPRIM) 100 MG tablet, Take 1 tablet (100 mg total) by mouth 3 (three) times daily., Disp: 270 tablet,  Rfl: 3 .  ALPRAZolam (XANAX) 0.25 MG tablet, Take 1 tablet (0.25 mg total) by mouth 2 (two) times daily as needed for anxiety., Disp: 180 tablet, Rfl: 1 .  aspirin EC 81 MG tablet, Take 81 mg by mouth at bedtime. , Disp: , Rfl:  .  bumetanide (BUMEX) 0.5 MG tablet, Take 3 tablets (1.5 mg total) by mouth 2 (two) times daily for 7 days., Disp: 90 tablet, Rfl: 0 .  carvedilol (COREG) 25 MG tablet, Take 1 tablet (25 mg total) by mouth 2 (two) times daily., Disp: 180 tablet, Rfl: 3 .  cetirizine (ZYRTEC) 10 MG tablet, Take 1 tablet (10 mg total) by mouth daily as needed (for allergies.)., Disp: 90 tablet, Rfl: 3 .  clopidogrel (PLAVIX) 75 MG tablet, Take 1 tablet (75 mg total) by mouth at bedtime., Disp: 90 tablet, Rfl: 3 .  cyclobenzaprine (FLEXERIL) 10 MG tablet, TAKE ONE TABLET BY MOUTH THREE TIMES DAILY AS NEEDED FOR MUSCLE SPASM, Disp: 90 tablet, Rfl: 5 .  ergocalciferol (VITAMIN D2) 1.25 MG (50000 UT) capsule, Take 50,000 Units by mouth once a week., Disp: , Rfl:  .  ezetimibe (ZETIA) 10 MG tablet, Take 1 tablet (10 mg total) by mouth daily., Disp: 90 tablet, Rfl: 1 .  hydrochlorothiazide (HYDRODIURIL) 25 MG tablet, Take 1 tablet (25 mg total) by mouth daily., Disp: 90 tablet, Rfl: 3 .  HYDROcodone-acetaminophen (NORCO) 10-325 MG tablet, Take 1 tablet by mouth every 6 (six) hours as needed., Disp: 120 tablet, Rfl: 0 .  isosorbide mononitrate (IMDUR) 60 MG 24 hr tablet, TAKE ONE (1) TABLET EACH DAY, Disp: 90 tablet, Rfl: 1 .  lisinopril (PRINIVIL,ZESTRIL) 5 MG tablet, Take 5 mg by mouth 1 day or 1 dose., Disp: , Rfl:  .  nitroGLYCERIN (NITROSTAT) 0.4 MG SL tablet, Place 1 tablet (0.4 mg total) under the tongue every 5 (five) minutes as needed for chest pain., Disp: 25 tablet, Rfl: 3 .  pantoprazole (PROTONIX) 40 MG tablet, Take 1 tablet (40 mg total) by mouth daily at 6 (six) AM., Disp: 30 tablet, Rfl: 5 .  pravastatin (PRAVACHOL) 80 MG tablet, Take 1 tablet (80 mg total) by mouth every evening.,  Disp: 90 tablet, Rfl: 1 .  pregabalin (LYRICA) 75 MG capsule, Take 1 capsule (75 mg total) by mouth 3 (three) times daily. New lower RENAL DOSING, Disp: 90 capsule, Rfl: 5 .  torsemide (DEMADEX) 20 MG tablet, Take 1 tablet (20 mg total) by mouth 2 (two) times daily., Disp: 180 tablet, Rfl: 1 .  ULTICARE MICRO PEN NEEDLES 32G X 4 MM MISC, EVERY DAY, Disp: 100 each, Rfl: 3 .  VENTOLIN HFA 108 (90 Base) MCG/ACT inhaler, USE 2 PUFFS EVERY 6 HOURS AS NEEDED, Disp: 18 g, Rfl: 6 .  VICTOZA 18 MG/3ML SOPN, INJECT 0.3ML (1.8MG ) SQ DAILY, Disp: 27 mL, Rfl: 13  Assessment/ Plan: 68 y.o. male   1. Acute right-sided low back pain with right-sided sciatica - Ambulatory referral to Orthopedic Surgery   No follow-ups on file.  Continue all other maintenance medications as listed above.  Start time: 12:42 PM End time: 12:55 PM  No orders of the defined types were placed in this encounter.   Particia Nearing PA-C [place] [phone]

## 2018-11-06 ENCOUNTER — Ambulatory Visit (INDEPENDENT_AMBULATORY_CARE_PROVIDER_SITE_OTHER): Payer: Medicare HMO

## 2018-11-06 ENCOUNTER — Other Ambulatory Visit: Payer: Self-pay

## 2018-11-06 ENCOUNTER — Encounter: Payer: Self-pay | Admitting: Orthopaedic Surgery

## 2018-11-06 ENCOUNTER — Ambulatory Visit (INDEPENDENT_AMBULATORY_CARE_PROVIDER_SITE_OTHER): Payer: Medicare HMO | Admitting: Orthopaedic Surgery

## 2018-11-06 VITALS — BP 122/70 | HR 101 | Ht 65.5 in | Wt 180.0 lb

## 2018-11-06 DIAGNOSIS — M5136 Other intervertebral disc degeneration, lumbar region: Secondary | ICD-10-CM

## 2018-11-06 DIAGNOSIS — G8929 Other chronic pain: Secondary | ICD-10-CM | POA: Diagnosis not present

## 2018-11-06 DIAGNOSIS — M545 Low back pain, unspecified: Secondary | ICD-10-CM

## 2018-11-06 NOTE — Progress Notes (Addendum)
Office Visit Note   Patient: Tommy Douglas           Date of Birth: 1951/03/24           MRN: [id number] Visit Date: 11/06/2018              Requested by: Terald Sleeper, PA-C [address] [place],  [place] [address] PCP: Terald Sleeper, PA-C   Assessment & Plan: Visit Diagnoses:  1. Chronic midline low back pain, unspecified whether sciatica present   2. DDD (degenerative disc disease), lumbar     Plan lumbar support.  Continue with physical therapy and hydrocodone.  No evidence of radiculopathy but if no improvement with all of the above would consider MRI scan of lumbar spine.  I am hesitant to prescribe a Medrol Dosepak because of his insulin-dependent diabetes  Follow-Up Instructions:   Orders:  Orders Placed This Encounter  Procedures  . XR Lumbar Spine 2-3 Views   No orders of the defined types were placed in this encounter.     Procedures: No procedures performed   Clinical Data: No additional findings.   Subjective: Chief Complaint  Patient presents with  . Lower Back - Pain  Patient presents today for lower back pain that has been present for 3 weeks. No known injury. Patient states that it hurts more on the right side. He has no numbness, tingling, or weakness. No previous back surgery. He takes hydrocodone 10/325 as prescribed by his PCP. Mr. Bulkley relates he is not had problems with his back prior to the onset of his pain 3 weeks ago.  He is not experiencing any significant lower extremity pain and is not had any numbness, tingling or weakness.  Pain is predominant on the right side and seems to be worse with certain activities like twisting.  He has been involved in physical therapy 3 times a week for the past several weeks and not sure it is made much of a difference.  He has been taking hydrocodone 12/28/2023 for pain.  He is an insulin-dependent diabetic and notes that he has very minimal neuropathy of both of his feet.  He does smoke  HPI  Review  of Systems   Objective: Vital Signs: BP 122/70   Pulse (!) 101   Ht 5' 5.5" (1.664 m)   Wt 180 lb (81.6 kg)   BMI 29.50 kg/m   Physical Exam Constitutional:      Appearance: He is well-developed.  Eyes:     Pupils: Pupils are equal, round, and reactive to light.  Pulmonary:     Effort: Pulmonary effort is normal.  Skin:    General: Skin is warm and dry.  Neurological:     Mental Status: He is alert and oriented to person, place, and time.  Psychiatric:        Behavior: Behavior normal.     Ortho Exam awake alert and oriented x3.  Comfortable sitting.  Straight leg raise was positive for posterior thigh pain bilaterally consistent with hamstring tightness.  Reflexes were depressed but appeared to be symmetrical.  Relates having good sensation to both of his feet although on occasion has had some burning and tingling consistent with diabetic neuropathy.  Does have some percussible tenderness along the entire lower lumbar spine at the lumbosacral junction but no sacral pain.  No masses.  Specialty Comments:  No specialty comments available.  Imaging: Xr Lumbar Spine 2-3 Views  Result Date: 11/06/2018 Films of the lumbar  spine were obtained AP lateral projection.  There are degenerative changes of the facet joints at L4-5 and L5-S1.  Some anterior spurring of the lumbar vertebrae.  Normal alignment.  No evidence of a listhesis.  Diffuse calcification of the abdominal aorta but without obvious aneurysmal dilatation.  No evidence of curvature on the AP film but there is an embedded brainstem stimulator on the right side limited views of the hips did not reveal any degenerative change.  No acute change in the pelvis    PMFS History: Patient Active Problem List   Diagnosis Date Noted  . Congestive heart failure ([facility]) 08/26/2017  . Stage 3 chronic kidney disease ([facility]) 08/26/2017  . Cough with hemoptysis 03/29/2017  . Coronary artery disease due to lipid rich plaque 02/06/2017  .  PVD (peripheral vascular disease) ([facility]) 12/22/2016  . Unstable angina ([facility])   . Essential hypertension 01/10/2016  . CAD S/P percutaneous coronary angioplasty 01/10/2016  . Type 2 diabetes mellitus with stage 2 chronic kidney disease, without long-term current use of insulin ([facility]) 01/10/2016  . Hyperlipidemia associated with type 2 diabetes mellitus ([facility]) 01/10/2016  . Chronic gout without tophus 01/10/2016  . Chronic nonseasonal allergic rhinitis due to pollen 01/10/2016  . DDD (degenerative disc disease), lumbar 01/10/2016  . Body mass index 29.0-29.9, adult 01/10/2016   Past Medical History:  Diagnosis Date  . Anxiety   . Arthritis    "qwhere" (12/20/2016)  . Chronic lower back pain   . CKD (chronic kidney disease), stage IV ([facility])   . Complication of anesthesia    "got the wrong kind of anesthesia ~ 2000 when they were looking down into my stomach"  . Coronary artery disease   . Gout   . Heart disease   . Hyperlipidemia   . Hypertension   . Pneumonia ~ 2015  . Seasonal allergies   . Type II diabetes mellitus ([facility])     Family History  Problem Relation Age of Onset  . Cancer Mother        male  . Diabetes Mother   . Stroke Father        x3  . Stroke Brother        x2   . Other Sister        bowel necrosis?   . Other Daughter        gout  . Other Son        gout  . Alcohol abuse Brother   . Other Sister        pneumonia  . Other Sister        pneumonia  . Cancer Daughter        male  . Other Daughter        fluid on brain - disabled     Past Surgical History:  Procedure Laterality Date  . CARDIAC CATHETERIZATION  12/20/2016  . CORONARY ANGIOPLASTY WITH STENT PLACEMENT  2004 X 2   "1st stent moved"  . CORONARY STENT INTERVENTION N/A 12/21/2016   Procedure: CORONARY STENT INTERVENTION;  Surgeon: Burnell Blanks, MD;  Location: [facility];  Service: Cardiovascular;  Laterality: N/A;  . ESOPHAGOGASTRODUODENOSCOPY  ~ 2000  . LAPAROSCOPIC  CHOLECYSTECTOMY    . LEFT HEART CATH AND CORONARY ANGIOGRAPHY N/A 12/20/2016   Procedure: LEFT HEART CATH AND CORONARY ANGIOGRAPHY;  Surgeon: Burnell Blanks, MD;  Location: [facility];  Service: Cardiovascular;  Laterality: N/A;   Social History   Occupational History  .  Occupation: retired     Comment: Designer, television/film set and Manufacturing engineer co.  Tobacco Use  . Smoking status: Current Every Day Smoker    Packs/day: 0.50    Years: 44.00    Pack years: 22.00    Types: Cigarettes  . Smokeless tobacco: Never Used  Substance and Sexual Activity  . Alcohol use: No  . Drug use: No  . Sexual activity: Not on file

## 2018-11-18 ENCOUNTER — Telehealth: Payer: Self-pay | Admitting: Physician Assistant

## 2018-11-18 ENCOUNTER — Ambulatory Visit (INDEPENDENT_AMBULATORY_CARE_PROVIDER_SITE_OTHER): Payer: Medicare HMO | Admitting: Nurse Practitioner

## 2018-11-18 ENCOUNTER — Encounter: Payer: Self-pay | Admitting: Nurse Practitioner

## 2018-11-18 DIAGNOSIS — R42 Dizziness and giddiness: Secondary | ICD-10-CM

## 2018-11-18 MED ORDER — PREDNISONE 20 MG PO TABS: ORAL_TABLET | ORAL | 0 refills | Status: DC

## 2018-11-18 MED ORDER — MECLIZINE HCL 25 MG PO TABS: 25.0000 mg | ORAL_TABLET | Freq: Three times a day (TID) | ORAL | 0 refills | Status: DC | PRN

## 2018-11-18 NOTE — Progress Notes (Signed)
   Virtual Visit via telephone Note Due to COVID-19 pandemic this visit was conducted virtually. This visit type was conducted due to national recommendations for restrictions regarding the COVID-19 Pandemic (e.g. social distancing, sheltering in place) in an effort to limit this patient's exposure and mitigate transmission in our community. All issues noted in this document were discussed and addressed.  A physical exam was not performed with this format.  I connected with Tommy Douglas on 11/18/18 at 11:00 by telephone and verified that I am speaking with the correct person using two identifiers. Tommy Douglas is currently located at home and no one is currently with her during visit. The provider, Mary-Margaret Hassell Done, FNP is located in their office at time of visit.  I discussed the limitations, risks, security and privacy concerns of performing an evaluation and management service by telephone and the availability of in person appointments. I also discussed with the patient that there may be a patient responsible charge related to this service. The patient expressed understanding and agreed to proceed.   History and Present Illness:   Chief Complaint: Dizziness   HPI Patient calls in today with c/o dizziness. occurs 2-3 times a day for the last 2 weeks. Does not occur every time. She says when she stands up she feels like the room is spinning. Episodes last 30sec to 1 minute. Denies nausea. No fever, no cough or congestion.    Review of Systems  Constitutional: Negative for chills and fever.  HENT: Negative.   Respiratory: Negative.   Cardiovascular: Negative.   Musculoskeletal: Negative.   Skin: Negative.   Neurological: Positive for dizziness. Negative for headaches.  Psychiatric/Behavioral: Negative.   All other systems reviewed and are negative.    Observations/Objective: Alert and oriented- answers all questions appropriately No distress Denies being dizzy  currently   Assessment and Plan: Tommy Douglas in today with chief complaint of Dizziness   1. Vertigo Rest Rise slowly form sitting to standing Rest Know that blood sugars will go up while on steroids - predniSONE (DELTASONE) 20 MG tablet; 2 po at sametime daily for 5 days  Dispense: 10 tablet; Refill: 0 - meclizine (ANTIVERT) 25 MG tablet; Take 1 tablet (25 mg total) by mouth 3 (three) times daily as needed for dizziness.  Dispense: 30 tablet; Refill: 0   Follow Up Instructions: Prn if no better    I discussed the assessment and treatment plan with the patient. The patient was provided an opportunity to ask questions and all were answered. The patient agreed with the plan and demonstrated an understanding of the instructions.   The patient was advised to call back or seek an in-person evaluation if the symptoms worsen or if the condition fails to improve as anticipated.  The above assessment and management plan was discussed with the patient. The patient verbalized understanding of and has agreed to the management plan. Patient is aware to call the clinic if symptoms persist or worsen. Patient is aware when to return to the clinic for a follow-up visit. Patient educated on when it is appropriate to go to the emergency department.   Time call ended:  11:10  I provided 10 minutes of non-face-to-face time during this encounter.    Mary-Margaret Hassell Done, FNP

## 2018-11-21 ENCOUNTER — Other Ambulatory Visit: Payer: Self-pay | Admitting: Physician Assistant

## 2018-11-21 ENCOUNTER — Other Ambulatory Visit: Payer: Self-pay | Admitting: Cardiovascular Disease

## 2018-11-21 DIAGNOSIS — M5136 Other intervertebral disc degeneration, lumbar region: Secondary | ICD-10-CM

## 2018-12-10 ENCOUNTER — Ambulatory Visit (INDEPENDENT_AMBULATORY_CARE_PROVIDER_SITE_OTHER): Payer: Medicare HMO | Admitting: Physician Assistant

## 2018-12-10 ENCOUNTER — Encounter: Payer: Self-pay | Admitting: Physician Assistant

## 2018-12-10 VITALS — BP 141/66 | HR 90 | Temp 98.2°F | Ht 65.5 in | Wt 180.6 lb

## 2018-12-10 DIAGNOSIS — M5441 Lumbago with sciatica, right side: Secondary | ICD-10-CM | POA: Diagnosis not present

## 2018-12-10 DIAGNOSIS — M5136 Other intervertebral disc degeneration, lumbar region: Secondary | ICD-10-CM | POA: Diagnosis not present

## 2018-12-10 MED ORDER — HYDROCODONE-ACETAMINOPHEN 10-325 MG PO TABS: 1.0000 | ORAL_TABLET | Freq: Four times a day (QID) | ORAL | 0 refills | Status: DC | PRN

## 2018-12-12 NOTE — Progress Notes (Signed)
BP (!) 141/66   Pulse 90   Temp 98.2 F (36.8 C) (Temporal)   Ht 5' 5.5" (1.664 m)   Wt 180 lb 9.6 oz (81.9 kg)   SpO2 95%   BMI 29.60 kg/m    Subjective:    Patient ID: Tommy Douglas, male    DOB: 1950-08-08, 68 y.o.   MRN: [id number]  HPI: Tommy Douglas is a 68 y.o. male presenting on 12/10/2018 for Diabetes and Back Pain  The patient states that his diabetes has been fairly stable he has not had any severe elevation.  He states that overall he is feeling very good.  His blood pressure has been good readings.  She has had good glucose readings.  We will plan to do labs soon  Patient continues with severe lumbar pain.  It has been several months this is gone on.  He did go and see orthopedics and x-ray showed significant degenerative in the lumbar spine.  And they did say that like follow-up with MRI.  He tried physical therapy and it failed.  He has been using a TENS unit that he bought over-the-counter and it gives him a minimal amount of help.  We will plan for an MRI can be ordered soon as possible with lumbar spine.  11/06/18 Films of the lumbar spine were obtained and AP and lateral projections.   There is some calcification of the abdominal aorta without obvious  aneurysmal dilatation. There are degenerative changes particularly at  L4-5 and L5-S1 with degenerative disc disease and narrowing and facet  arthritis. No evidence of a listhesis. Anterior spurring of L4 and 5.   Some mild decrease in height of the superior endplate of L5 but looks old.  Calcification of the anterior longitudinal ligament. Degenerative  left-sided scoliosis of about 8 degrees and limited view of the left hip  demonstrates some narrowing of the joint space superiorly but not  symptomatic   PAIN ASSESSMENT: Cause of pain- DDD, gout, back pain, OA He is currently in a severe flareup of the back pain.  So we will leave his medication the same.  This patient returns for a 3 month recheck  on narcotic use for the above named conditions  Current medications-hydrocodone 10/325 1 tablet up to 4 times a day times a day as needed for severe pain Flexeril 10 mg 1 daily Lyrica 200 mg 1 twice a day  Alprazolam 0.5 mg we are to reduce to 0.25 mg 1 twice a day as needed for anxiety Medication side effects- none Any concerns- no  Pain on scale of 1-10- 8 Frequency- daily What increases pain- walking What makes pain Better- rest Effects on ADL - moderate Any change in general medical condition- no  Effectiveness of current meds- good Adverse reactions form pain meds-no PMP AWARE website reviewed: Yes Any suspicious activity on PMP Aware: No MME daily dose: 40  Contract on file 08/27/18 Last UDS  08/27/18  [facility] script sent or current  History of overdose or risk of abuse no   ANXIETY ASSESSMENT Cause of anxiety: GAD This patient returns for a  month recheck on narcotic use for the above named condition(s)  Current medications- alprazolam lowering to .25 BID this month Congratulated him on getting this down, Other medications tried: multiple SSRI, SNRI Medication side effects- no Any concerns- no Any change in general medical condition- no Effectiveness of current meds- good PMP AWARE website reviewed: Yes Any suspicious activity on PMP Aware: No LME  daily dose:lowered to 1 as of this month    Past Medical History:  Diagnosis Date  . Anxiety   . Arthritis    "qwhere" (12/20/2016)  . Chronic lower back pain   . CKD (chronic kidney disease), stage IV ([place])   . Complication of anesthesia    "got the wrong kind of anesthesia ~ 2000 when they were looking down into my stomach"  . Coronary artery disease   . Gout   . Heart disease   . Hyperlipidemia   . Hypertension   . Pneumonia ~ 2015  . Seasonal allergies   . Type II diabetes mellitus ([facility])    Relevant past medical, surgical, family and social history reviewed and updated as indicated. Interim  medical history since our last visit reviewed. Allergies and medications reviewed and updated. DATA REVIEWED: CHART IN EPIC  Family History reviewed for pertinent findings.  Review of Systems  Constitutional: Negative.  Negative for appetite change and fatigue.  HENT: Negative.   Eyes: Negative.  Negative for pain and visual disturbance.  Respiratory: Negative.  Negative for cough, chest tightness, shortness of breath and wheezing.   Cardiovascular: Negative.  Negative for chest pain, palpitations and leg swelling.  Gastrointestinal: Negative.  Negative for abdominal pain, diarrhea, nausea and vomiting.  Endocrine: Negative.   Genitourinary: Negative.   Musculoskeletal: Positive for arthralgias, back pain and gait problem.  Skin: Negative.  Negative for color change and rash.  Neurological: Positive for weakness. Negative for numbness and headaches.  Psychiatric/Behavioral: Negative.     Allergies as of 12/10/2018      Reactions   Atorvastatin    All statins make him cough   Statins Cough   Pt is currently taking pravastatin    Sulfa Antibiotics       Medication List       Accurate as of December 10, 2018 11:59 PM. If you have any questions, ask your nurse or doctor.        STOP taking these medications   meclizine 25 MG tablet Commonly known as: ANTIVERT Stopped by: Terald Sleeper, PA-C   predniSONE 20 MG tablet Commonly known as: Deltasone Stopped by: Terald Sleeper, PA-C     TAKE these medications   acetaminophen 325 MG tablet Commonly known as: TYLENOL Take 2 tablets (650 mg total) by mouth every 4 (four) hours as needed for headache or mild pain.   allopurinol 100 MG tablet Commonly known as: ZYLOPRIM Take 1 tablet (100 mg total) by mouth 3 (three) times daily.   ALPRAZolam 0.25 MG tablet Commonly known as: XANAX Take 1 tablet (0.25 mg total) by mouth 2 (two) times daily as needed for anxiety.   aspirin EC 81 MG tablet Take 81 mg by mouth at bedtime.    bumetanide 0.5 MG tablet Commonly known as: BUMEX Take 3 tablets (1.5 mg total) by mouth 2 (two) times daily for 7 days.   carvedilol 25 MG tablet Commonly known as: COREG Take 1 tablet (25 mg total) by mouth 2 (two) times daily.   cetirizine 10 MG tablet Commonly known as: ZYRTEC Take 1 tablet (10 mg total) by mouth daily as needed (for allergies.).   clopidogrel 75 MG tablet Commonly known as: PLAVIX TAKE ONE TABLET DAILY AT BEDTIME   cyclobenzaprine 10 MG tablet Commonly known as: FLEXERIL TAKE ONE TABLET BY MOUTH THREE TIMES DAILY AS NEEDED FOR MUSCLE SPASM   ergocalciferol 1.25 MG (50000 UT) capsule Commonly known as: VITAMIN D2 Take 50,000  Units by mouth once a week.   ezetimibe 10 MG tablet Commonly known as: ZETIA TAKE ONE (1) TABLET EACH DAY   hydrochlorothiazide 25 MG tablet Commonly known as: HYDRODIURIL Take 1 tablet (25 mg total) by mouth daily.   HYDROcodone-acetaminophen 10-325 MG tablet Commonly known as: NORCO Take 1 tablet by mouth every 6 (six) hours as needed. What changed: Another medication with the same name was added. Make sure you understand how and when to take each. Changed by: Terald Sleeper, PA-C   HYDROcodone-acetaminophen 10-325 MG tablet Commonly known as: NORCO Take 1 tablet by mouth every 6 (six) hours as needed. What changed: You were already taking a medication with the same name, and this prescription was added. Make sure you understand how and when to take each. Changed by: Terald Sleeper, PA-C   HYDROcodone-acetaminophen 10-325 MG tablet Commonly known as: NORCO Take 1 tablet by mouth every 6 (six) hours as needed. What changed: You were already taking a medication with the same name, and this prescription was added. Make sure you understand how and when to take each. Changed by: Terald Sleeper, PA-C   isosorbide mononitrate 60 MG 24 hr tablet Commonly known as: IMDUR TAKE ONE (1) TABLET EACH DAY   lisinopril 5 MG tablet  Commonly known as: ZESTRIL Take 5 mg by mouth 1 day or 1 dose.   nitroGLYCERIN 0.4 MG SL tablet Commonly known as: NITROSTAT Place 1 tablet (0.4 mg total) under the tongue every 5 (five) minutes as needed for chest pain.   pantoprazole 40 MG tablet Commonly known as: PROTONIX Take 1 tablet (40 mg total) by mouth daily at 6 (six) AM.   pravastatin 80 MG tablet Commonly known as: PRAVACHOL Take 1 tablet (80 mg total) by mouth every evening.   pregabalin 75 MG capsule Commonly known as: LYRICA Take 1 capsule (75 mg total) by mouth 3 (three) times daily. New lower RENAL DOSING   torsemide 20 MG tablet Commonly known as: DEMADEX Take 1 tablet (20 mg total) by mouth 2 (two) times daily.   UltiCare Micro Pen Needles 32G X 4 MM Misc Generic drug: Insulin Pen Needle EVERY DAY   Ventolin HFA 108 (90 Base) MCG/ACT inhaler Generic drug: albuterol USE 2 PUFFS EVERY 6 HOURS AS NEEDED   Victoza 18 MG/3ML Sopn Generic drug: liraglutide INJECT 0.3ML (1.8MG) SQ DAILY          Objective:    BP (!) 141/66   Pulse 90   Temp 98.2 F (36.8 C) (Temporal)   Ht 5' 5.5" (1.664 m)   Wt 180 lb 9.6 oz (81.9 kg)   SpO2 95%   BMI 29.60 kg/m   Allergies  Allergen Reactions  . Atorvastatin     All statins make him cough  . Statins Cough    Pt is currently taking pravastatin   . Sulfa Antibiotics     Wt Readings from Last 3 Encounters:  12/10/18 180 lb 9.6 oz (81.9 kg)  11/06/18 180 lb (81.6 kg)  09/26/18 182 lb (82.6 kg)    Physical Exam Vitals signs and nursing note reviewed.  Constitutional:      General: He is not in acute distress.    Appearance: He is well-developed.  HENT:     Head: Normocephalic and atraumatic.  Eyes:     Conjunctiva/sclera: Conjunctivae normal.     Pupils: Pupils are equal, round, and reactive to light.  Cardiovascular:     Rate and Rhythm: Normal  rate and regular rhythm.     Heart sounds: Normal heart sounds.  Pulmonary:     Effort: Pulmonary  effort is normal. No respiratory distress.     Breath sounds: Normal breath sounds.  Musculoskeletal:     Lumbar back: He exhibits decreased range of motion, tenderness, pain and spasm.  Skin:    General: Skin is warm and dry.  Neurological:     Motor: Weakness present.     Deep Tendon Reflexes:     Reflex Scores:      Patellar reflexes are 1+ on the right side and 3+ on the left side.    Comments: Right leg extension weak, positive leg raise  Psychiatric:        Behavior: Behavior normal.     Results for orders placed or performed in visit on 08/27/18  ToxASSURE Select 13 (MW), Urine  Result Value Ref Range   Summary FINAL   CMP14+EGFR  Result Value Ref Range   Glucose 206 (H) 65 - 99 mg/dL   BUN 12 8 - 27 mg/dL   Creatinine, Ser 1.64 (H) 0.76 - 1.27 mg/dL   GFR calc non Af Amer 43 (L) >59 mL/min/1.73   GFR calc Af Amer 49 (L) >59 mL/min/1.73   BUN/Creatinine Ratio 7 (L) 10 - 24   Sodium 139 134 - 144 mmol/L   Potassium 4.1 3.5 - 5.2 mmol/L   Chloride 98 96 - 106 mmol/L   CO2 26 20 - 29 mmol/L   Calcium 9.3 8.6 - 10.2 mg/dL   Total Protein 6.8 6.0 - 8.5 g/dL   Albumin 4.2 3.8 - 4.8 g/dL   Globulin, Total 2.6 1.5 - 4.5 g/dL   Albumin/Globulin Ratio 1.6 1.2 - 2.2   Bilirubin Total 0.4 0.0 - 1.2 mg/dL   Alkaline Phosphatase 135 (H) 39 - 117 IU/L   AST 16 0 - 40 IU/L   ALT 7 0 - 44 IU/L  Bayer DCA Hb A1c Waived  Result Value Ref Range   HB A1C (BAYER DCA - WAIVED) 7.2 (H) <7.0 %  Lipid panel  Result Value Ref Range   Cholesterol, Total 142 100 - 199 mg/dL   Triglycerides 146 0 - 149 mg/dL   HDL 23 (L) >39 mg/dL   VLDL Cholesterol Cal 29 5 - 40 mg/dL   LDL Calculated 90 0 - 99 mg/dL   Chol/HDL Ratio 6.2 (H) 0.0 - 5.0 ratio  CBC with Differential/Platelet  Result Value Ref Range   WBC 8.5 3.4 - 10.8 x10E3/uL   RBC 4.92 4.14 - 5.80 x10E6/uL   Hemoglobin 13.1 13.0 - 17.7 g/dL   Hematocrit 42.8 37.5 - 51.0 %   MCV 87 79 - 97 fL   MCH 26.6 26.6 - 33.0 pg   MCHC  30.6 (L) 31.5 - 35.7 g/dL   RDW 13.9 11.6 - 15.4 %   Platelets 283 150 - 450 x10E3/uL   Neutrophils 52 Not Estab. %   Lymphs 25 Not Estab. %   Monocytes 7 Not Estab. %   Eos 15 Not Estab. %   Basos 1 Not Estab. %   Neutrophils Absolute 4.4 1.4 - 7.0 x10E3/uL   Lymphocytes Absolute 2.1 0.7 - 3.1 x10E3/uL   Monocytes Absolute 0.6 0.1 - 0.9 x10E3/uL   EOS (ABSOLUTE) 1.3 (H) 0.0 - 0.4 x10E3/uL   Basophils Absolute 0.1 0.0 - 0.2 x10E3/uL   Immature Granulocytes 0 Not Estab. %   Immature Grans (Abs) 0.0 0.0 - 0.1 x10E3/uL  Assessment & Plan:   1. DDD (degenerative disc disease), lumbar - HYDROcodone-acetaminophen (NORCO) 10-325 MG tablet; Take 1 tablet by mouth every 6 (six) hours as needed.  Dispense: 120 tablet; Refill: 0 - MR Lumbar Spine Wo Contrast; Future  2. Acute right-sided low back pain with right-sided sciatica - MR Lumbar Spine Wo Contrast; Future   Continue all other maintenance medications as listed above.  Follow up plan: Return in about 3 months (around 03/11/2019) for recheck medications and labs.  Educational handout given for [place] PA-C [place] [address]  [place], [place] [address] [phone]   12/12/2018, 4:55 PM

## 2018-12-16 NOTE — Progress Notes (Signed)
I see that your last note showed discontinuation of medications as his UDS was not appropriate.  I do not see a note re: follow up of this finding.  Would not recommend refilling medications if patient is in violation of contract.  Agree with taper of benzodiazepine so as to not precipitate life threatening withdrawal.  Ashly M. Lajuana Ripple, [facility]

## 2018-12-23 ENCOUNTER — Other Ambulatory Visit: Payer: Self-pay

## 2018-12-23 ENCOUNTER — Ambulatory Visit (HOSPITAL_COMMUNITY)
Admission: RE | Admit: 2018-12-23 | Discharge: 2018-12-23 | Disposition: A | Discharge status: Home / Self Care | Payer: Medicare HMO | Source: Ambulatory Visit | Attending: Physician Assistant | Admitting: Physician Assistant

## 2018-12-23 DIAGNOSIS — M5441 Lumbago with sciatica, right side: Secondary | ICD-10-CM

## 2018-12-23 DIAGNOSIS — M5136 Other intervertebral disc degeneration, lumbar region: Secondary | ICD-10-CM | POA: Diagnosis present

## 2018-12-25 ENCOUNTER — Other Ambulatory Visit: Payer: Self-pay | Admitting: *Deleted

## 2018-12-25 DIAGNOSIS — G8929 Other chronic pain: Secondary | ICD-10-CM

## 2018-12-25 DIAGNOSIS — M5441 Lumbago with sciatica, right side: Secondary | ICD-10-CM

## 2018-12-25 DIAGNOSIS — M5136 Other intervertebral disc degeneration, lumbar region: Secondary | ICD-10-CM

## 2019-02-04 ENCOUNTER — Ambulatory Visit: Payer: Medicare HMO | Admitting: Physician Assistant

## 2019-02-17 ENCOUNTER — Other Ambulatory Visit: Payer: Self-pay | Admitting: Physician Assistant

## 2019-02-17 DIAGNOSIS — F411 Generalized anxiety disorder: Secondary | ICD-10-CM

## 2019-02-18 ENCOUNTER — Other Ambulatory Visit: Payer: Self-pay | Admitting: Physician Assistant

## 2019-03-10 ENCOUNTER — Other Ambulatory Visit: Payer: Self-pay

## 2019-03-11 ENCOUNTER — Encounter: Payer: Self-pay | Admitting: Physician Assistant

## 2019-03-11 ENCOUNTER — Ambulatory Visit (INDEPENDENT_AMBULATORY_CARE_PROVIDER_SITE_OTHER): Payer: Medicare HMO | Admitting: Physician Assistant

## 2019-03-11 ENCOUNTER — Other Ambulatory Visit: Payer: Self-pay | Admitting: Cardiovascular Disease

## 2019-03-11 ENCOUNTER — Other Ambulatory Visit: Payer: Self-pay | Admitting: Physician Assistant

## 2019-03-11 VITALS — BP 133/72 | HR 90 | Temp 97.3°F | Ht 65.5 in | Wt 179.6 lb

## 2019-03-11 DIAGNOSIS — I739 Peripheral vascular disease, unspecified: Secondary | ICD-10-CM | POA: Diagnosis not present

## 2019-03-11 DIAGNOSIS — J3089 Other allergic rhinitis: Secondary | ICD-10-CM

## 2019-03-11 DIAGNOSIS — E1122 Type 2 diabetes mellitus with diabetic chronic kidney disease: Secondary | ICD-10-CM | POA: Diagnosis not present

## 2019-03-11 DIAGNOSIS — M5136 Other intervertebral disc degeneration, lumbar region: Secondary | ICD-10-CM

## 2019-03-11 DIAGNOSIS — M1A9XX0 Chronic gout, unspecified, without tophus (tophi): Secondary | ICD-10-CM

## 2019-03-11 DIAGNOSIS — N182 Chronic kidney disease, stage 2 (mild): Secondary | ICD-10-CM

## 2019-03-11 DIAGNOSIS — I2510 Atherosclerotic heart disease of native coronary artery without angina pectoris: Secondary | ICD-10-CM | POA: Diagnosis not present

## 2019-03-11 DIAGNOSIS — Z9861 Coronary angioplasty status: Secondary | ICD-10-CM

## 2019-03-11 DIAGNOSIS — F411 Generalized anxiety disorder: Secondary | ICD-10-CM

## 2019-03-11 DIAGNOSIS — Z0000 Encounter for general adult medical examination without abnormal findings: Secondary | ICD-10-CM

## 2019-03-11 LAB — BAYER DCA HB A1C WAIVED: HB A1C (BAYER DCA - WAIVED): 6.3 % (ref <7.0)

## 2019-03-11 MED ORDER — CETIRIZINE HCL 10 MG PO TABS: 10.0000 mg | ORAL_TABLET | Freq: Every day | ORAL | 3 refills | Status: DC | PRN

## 2019-03-11 MED ORDER — TORSEMIDE 20 MG PO TABS: 20.0000 mg | ORAL_TABLET | Freq: Two times a day (BID) | ORAL | 1 refills | Status: DC

## 2019-03-11 MED ORDER — ERGOCALCIFEROL 1.25 MG (50000 UT) PO CAPS: 50000.0000 [IU] | ORAL_CAPSULE | ORAL | 3 refills | Status: DC

## 2019-03-11 MED ORDER — CYCLOBENZAPRINE HCL 10 MG PO TABS: 10.0000 mg | ORAL_TABLET | Freq: Three times a day (TID) | ORAL | 5 refills | Status: DC | PRN

## 2019-03-11 MED ORDER — ALLOPURINOL 100 MG PO TABS: 100.0000 mg | ORAL_TABLET | Freq: Three times a day (TID) | ORAL | 3 refills | Status: DC

## 2019-03-11 MED ORDER — PANTOPRAZOLE SODIUM 40 MG PO TBEC: 40.0000 mg | DELAYED_RELEASE_TABLET | Freq: Every day | ORAL | 5 refills | Status: DC

## 2019-03-11 MED ORDER — CLOPIDOGREL BISULFATE 75 MG PO TABS: 75.0000 mg | ORAL_TABLET | Freq: Every day | ORAL | 3 refills | Status: DC

## 2019-03-12 LAB — CBC WITH DIFFERENTIAL/PLATELET
Basophils Absolute: 0.2 10*3/uL (ref 0.0–0.2)
Basos: 2 %
EOS (ABSOLUTE): 1 10*3/uL — ABNORMAL HIGH (ref 0.0–0.4)
Eos: 11 %
Hematocrit: 41.7 % (ref 37.5–51.0)
Hemoglobin: 13.8 g/dL (ref 13.0–17.7)
Immature Grans (Abs): 0 10*3/uL (ref 0.0–0.1)
Immature Granulocytes: 0 %
Lymphocytes Absolute: 2.6 10*3/uL (ref 0.7–3.1)
Lymphs: 31 %
MCH: 28.2 pg (ref 26.6–33.0)
MCHC: 33.1 g/dL (ref 31.5–35.7)
MCV: 85 fL (ref 79–97)
Monocytes Absolute: 0.7 10*3/uL (ref 0.1–0.9)
Monocytes: 8 %
Neutrophils Absolute: 4 10*3/uL (ref 1.4–7.0)
Neutrophils: 48 %
Platelets: 270 10*3/uL (ref 150–450)
RBC: 4.89 x10E6/uL (ref 4.14–5.80)
RDW: 13.3 % (ref 11.6–15.4)
WBC: 8.4 10*3/uL (ref 3.4–10.8)

## 2019-03-12 LAB — CMP14+EGFR
ALT: 10 IU/L (ref 0–44)
AST: 13 IU/L (ref 0–40)
Albumin/Globulin Ratio: 1.4 (ref 1.2–2.2)
Albumin: 4.1 g/dL (ref 3.8–4.8)
Alkaline Phosphatase: 127 IU/L — ABNORMAL HIGH (ref 39–117)
BUN/Creatinine Ratio: 10 (ref 10–24)
BUN: 18 mg/dL (ref 8–27)
Bilirubin Total: 0.4 mg/dL (ref 0.0–1.2)
CO2: 25 mmol/L (ref 20–29)
Calcium: 9 mg/dL (ref 8.6–10.2)
Chloride: 98 mmol/L (ref 96–106)
Creatinine, Ser: 1.8 mg/dL — ABNORMAL HIGH (ref 0.76–1.27)
GFR calc Af Amer: 44 mL/min/{1.73_m2} — ABNORMAL LOW (ref >59)
GFR calc non Af Amer: 38 mL/min/{1.73_m2} — ABNORMAL LOW (ref >59)
Globulin, Total: 3 g/dL (ref 1.5–4.5)
Glucose: 136 mg/dL — ABNORMAL HIGH (ref 65–99)
Potassium: 3.7 mmol/L (ref 3.5–5.2)
Sodium: 138 mmol/L (ref 134–144)
Total Protein: 7.1 g/dL (ref 6.0–8.5)

## 2019-03-12 LAB — PSA: Prostate Specific Ag, Serum: 1.7 ng/mL (ref 0.0–4.0)

## 2019-03-12 LAB — TSH: TSH: 1.72 u[IU]/mL (ref 0.450–4.500)

## 2019-03-12 LAB — MICROALBUMIN / CREATININE URINE RATIO
Creatinine, Urine: 72.2 mg/dL
Microalb/Creat Ratio: 107 mg/g creat — ABNORMAL HIGH (ref 0–29)
Microalbumin, Urine: 77.2 ug/mL

## 2019-03-12 LAB — LIPID PANEL
Chol/HDL Ratio: 4.2 ratio (ref 0.0–5.0)
Cholesterol, Total: 105 mg/dL (ref 100–199)
HDL: 25 mg/dL — ABNORMAL LOW (ref >39)
LDL Chol Calc (NIH): 54 mg/dL (ref 0–99)
Triglycerides: 150 mg/dL — ABNORMAL HIGH (ref 0–149)
VLDL Cholesterol Cal: 26 mg/dL (ref 5–40)

## 2019-03-17 NOTE — Progress Notes (Signed)
Acute Office Visit  Subjective:    Patient ID: Tommy Douglas, male    DOB: 11-Jun-1950, 68 y.o.   MRN: [id number]  Chief Complaint  Patient presents with  . Medical Management of Chronic Issues  . Diabetes    Diabetes He presents for his follow-up diabetic visit. He has type 2 diabetes mellitus. No MedicAlert identification noted. The initial diagnosis of diabetes was made 10 years ago. His disease course has been stable. There are no hypoglycemic associated symptoms. Pertinent negatives for hypoglycemia include no headaches. Pertinent negatives for diabetes include no chest pain, no fatigue, no foot paresthesias, no polydipsia, no polyphagia and no weakness. Symptoms are stable. Diabetic complications include nephropathy. Pertinent negatives for diabetic complications include no CVA. Risk factors for coronary artery disease include diabetes mellitus, dyslipidemia, family history, male sex, obesity and hypertension. Current diabetic treatment includes oral agent (dual therapy). He is compliant with treatment most of the time. His weight is stable. An ACE inhibitor/angiotensin II receptor blocker is being taken. He does not see a podiatrist.Eye exam is current.  Hypertension This is a chronic problem. The current episode started more than 1 year ago. The problem is unchanged. The problem is controlled. Pertinent negatives include no chest pain, headaches, palpitations or shortness of breath. There are no associated agents to hypertension. There are no known risk factors for coronary artery disease. Past treatments include ACE inhibitors, beta blockers and diuretics. There are no compliance problems.  Hypertensive end-organ damage includes angina, kidney disease and CAD/MI. There is no history of CVA or heart failure. Identifiable causes of hypertension include chronic renal disease and hyperparathyroidism.  Hyperlipidemia The problem is controlled. Recent lipid tests were reviewed and are low.  Exacerbating diseases include chronic renal disease and diabetes. Pertinent negatives include no chest pain or shortness of breath. Current antihyperlipidemic treatment includes statins. The current treatment provides moderate improvement of lipids. There are no compliance problems.      Past Medical History:  Diagnosis Date  . Anxiety   . Arthritis    "qwhere" (12/20/2016)  . Chronic lower back pain   . CKD (chronic kidney disease), stage IV ([place])   . Complication of anesthesia    "got the wrong kind of anesthesia ~ 2000 when they were looking down into my stomach"  . Coronary artery disease   . Gout   . Heart disease   . Hyperlipidemia   . Hypertension   . Pneumonia ~ 2015  . Seasonal allergies   . Type II diabetes mellitus ([place])     Past Surgical History:  Procedure Laterality Date  . CARDIAC CATHETERIZATION  12/20/2016  . CORONARY ANGIOPLASTY WITH STENT PLACEMENT  2004 X 2   "1st stent moved"  . CORONARY STENT INTERVENTION N/A 12/21/2016   Procedure: CORONARY STENT INTERVENTION;  Surgeon: Burnell Blanks, MD;  Location: [facility];  Service: Cardiovascular;  Laterality: N/A;  . ESOPHAGOGASTRODUODENOSCOPY  ~ 2000  . LAPAROSCOPIC CHOLECYSTECTOMY    . LEFT HEART CATH AND CORONARY ANGIOGRAPHY N/A 12/20/2016   Procedure: LEFT HEART CATH AND CORONARY ANGIOGRAPHY;  Surgeon: Burnell Blanks, MD;  Location: [facility];  Service: Cardiovascular;  Laterality: N/A;    Family History  Problem Relation Age of Onset  . Cancer Mother        male  . Diabetes Mother   . Stroke Father        x3  . Stroke Brother        x2   .  Other Sister        bowel necrosis?   . Other Daughter        gout  . Other Son        gout  . Alcohol abuse Brother   . Other Sister        pneumonia  . Other Sister        pneumonia  . Cancer Daughter        male  . Other Daughter        fluid on brain - disabled     Social History   Socioeconomic History  .  Marital status: Widowed    Spouse name: Not on file  . Number of children: 7  . Years of education: Not on file  . Highest education level: High school graduate  Occupational History  . Occupation: retired     Comment: Designer, television/film set and Manufacturing engineer co.  Tobacco Use  . Smoking status: Current Every Day Smoker    Packs/day: 0.50    Years: 44.00    Pack years: 22.00    Types: Cigarettes  . Smokeless tobacco: Never Used  Substance and Sexual Activity  . Alcohol use: No  . Drug use: No  . Sexual activity: Not on file  Other Topics Concern  . Not on file  Social History Narrative  . Not on file   Social Determinants of Health   Financial Resource Strain: Medium Risk  . Difficulty of Paying Living Expenses: Somewhat hard  Food Insecurity: No Food Insecurity  . Worried About Charity fundraiser in the Last Year: Never true  . Ran Out of Food in the Last Year: Never true  Transportation Needs: No Transportation Needs  . Lack of Transportation (Medical): No  . Lack of Transportation (Non-Medical): No  Physical Activity: Inactive  . Days of Exercise per Week: 0 days  . Minutes of Exercise per Session: 0 min  Stress: Stress Concern Present  . Feeling of Stress : To some extent  Social Connections: Somewhat Isolated  . Frequency of Communication with Friends and Family: More than three times a week  . Frequency of Social Gatherings with Friends and Family: More than three times a week  . Attends Religious Services: Never  . Active Member of Clubs or Organizations: No  . Attends Archivist Meetings: Never  . Marital Status: Living with partner  Intimate Partner Violence: Not At Risk  . Fear of Current or Ex-Partner: No  . Emotionally Abused: No  . Physically Abused: No  . Sexually Abused: No    Outpatient Medications Prior to Visit  Medication Sig Dispense Refill  . acetaminophen (TYLENOL) 325 MG tablet Take 2 tablets (650 mg total) by mouth every 4 (four)  hours as needed for headache or mild pain.    [name] aspirin EC 81 MG tablet Take 81 mg by mouth at bedtime.     . carvedilol (COREG) 25 MG tablet TAKE ONE TABLET BY MOUTH TWICE DAILY 180 tablet 1  . ezetimibe (ZETIA) 10 MG tablet TAKE ONE (1) TABLET EACH DAY 90 tablet 2  . isosorbide mononitrate (IMDUR) 60 MG 24 hr tablet TAKE ONE (1) TABLET EACH DAY 90 tablet 1  . lisinopril (PRINIVIL,ZESTRIL) 5 MG tablet Take 5 mg by mouth 1 day or 1 dose.    . pregabalin (LYRICA) 75 MG capsule Take 1 capsule (75 mg total) by mouth 3 (three) times daily. New lower RENAL DOSING 90 capsule 5  .  ULTICARE MICRO PEN NEEDLES 32G X 4 MM MISC EVERY DAY 100 each 3  . VENTOLIN HFA 108 (90 Base) MCG/ACT inhaler USE 2 PUFFS EVERY 6 HOURS AS NEEDED 18 g 6  . VICTOZA 18 MG/3ML SOPN INJECT 0.3ML (1.8MG) SQ DAILY 27 mL 13  . allopurinol (ZYLOPRIM) 100 MG tablet Take 1 tablet (100 mg total) by mouth 3 (three) times daily. 270 tablet 3  . ALPRAZolam (XANAX) 0.25 MG tablet Take 1 tablet (0.25 mg total) by mouth 2 (two) times daily as needed for anxiety. 180 tablet 1  . cetirizine (ZYRTEC) 10 MG tablet Take 1 tablet (10 mg total) by mouth daily as needed (for allergies.). 90 tablet 3  . clopidogrel (PLAVIX) 75 MG tablet TAKE ONE TABLET DAILY AT BEDTIME 90 tablet 0  . cyclobenzaprine (FLEXERIL) 10 MG tablet TAKE ONE TABLET BY MOUTH THREE TIMES DAILY AS NEEDED FOR MUSCLE SPASM 90 tablet 5  . ergocalciferol (VITAMIN D2) 1.25 MG (50000 UT) capsule Take 50,000 Units by mouth once a week.    . hydrochlorothiazide (HYDRODIURIL) 25 MG tablet Take 1 tablet (25 mg total) by mouth daily. 90 tablet 3  . HYDROcodone-acetaminophen (NORCO) 10-325 MG tablet Take 1 tablet by mouth every 6 (six) hours as needed. 120 tablet 0  . HYDROcodone-acetaminophen (NORCO) 10-325 MG tablet Take 1 tablet by mouth every 6 (six) hours as needed. 120 tablet 0  . HYDROcodone-acetaminophen (NORCO) 10-325 MG tablet Take 1 tablet by mouth every 6 (six) hours as needed. 120  tablet 0  . pantoprazole (PROTONIX) 40 MG tablet Take 1 tablet (40 mg total) by mouth daily at 6 (six) AM. 30 tablet 5  . torsemide (DEMADEX) 20 MG tablet Take 1 tablet (20 mg total) by mouth 2 (two) times daily. 180 tablet 1  . nitroGLYCERIN (NITROSTAT) 0.4 MG SL tablet Place 1 tablet (0.4 mg total) under the tongue every 5 (five) minutes as needed for chest pain. 25 tablet 3  . bumetanide (BUMEX) 0.5 MG tablet Take 3 tablets (1.5 mg total) by mouth 2 (two) times daily for 7 days. 90 tablet 0  . pravastatin (PRAVACHOL) 80 MG tablet Take 1 tablet (80 mg total) by mouth every evening. 90 tablet 1   No facility-administered medications prior to visit.    Allergies  Allergen Reactions  . Atorvastatin     All statins make him cough  . Statins Cough    Pt is currently taking pravastatin   . Sulfa Antibiotics     Review of Systems  Constitutional: Negative.  Negative for appetite change and fatigue.  HENT: Negative.   Eyes: Negative.  Negative for pain and visual disturbance.  Respiratory: Negative.  Negative for cough, chest tightness, shortness of breath and wheezing.   Cardiovascular: Negative.  Negative for chest pain, palpitations and leg swelling.  Gastrointestinal: Negative.  Negative for abdominal pain, diarrhea, nausea and vomiting.  Endocrine: Negative.  Negative for polydipsia and polyphagia.  Genitourinary: Negative.   Musculoskeletal: Negative.   Skin: Negative.  Negative for color change and rash.  Neurological: Negative.  Negative for weakness, numbness and headaches.  Psychiatric/Behavioral: Negative.        Objective:    Physical Exam Constitutional:      Appearance: He is well-developed.  HENT:     Head: Normocephalic and atraumatic.  Eyes:     Conjunctiva/sclera: Conjunctivae normal.     Pupils: Pupils are equal, round, and reactive to light.  Cardiovascular:     Rate and Rhythm: Normal rate  and regular rhythm.     Heart sounds: Normal heart sounds.    Pulmonary:     Effort: Pulmonary effort is normal.     Breath sounds: Normal breath sounds.  Abdominal:     General: Bowel sounds are normal.     Palpations: Abdomen is soft.  Musculoskeletal:        General: Normal range of motion.     Cervical back: Normal range of motion and neck supple.  Skin:    General: Skin is warm and dry.     BP 133/72   Pulse 90   Temp (!) 97.3 F (36.3 C) (Temporal)   Ht 5' 5.5" (1.664 m)   Wt 179 lb 9.6 oz (81.5 kg)   SpO2 95%   BMI 29.43 kg/m  Wt Readings from Last 3 Encounters:  03/11/19 179 lb 9.6 oz (81.5 kg)  12/10/18 180 lb 9.6 oz (81.9 kg)  11/06/18 180 lb (81.6 kg)    Health Maintenance Due  Topic Date Due  . Hepatitis C Screening  1950/10/30  . FOOT EXAM  09/08/1960  . OPHTHALMOLOGY EXAM  09/08/1960  . COLONOSCOPY  09/08/2000  . INFLUENZA VACCINE  10/26/2018    There are no preventive care reminders to display for this patient.   Lab Results  Component Value Date   TSH 1.720 03/11/2019   Lab Results  Component Value Date   WBC 8.4 03/11/2019   HGB 13.8 03/11/2019   HCT 41.7 03/11/2019   MCV 85 03/11/2019   PLT 270 03/11/2019   Lab Results  Component Value Date   NA 138 03/11/2019   K 3.7 03/11/2019   CO2 25 03/11/2019   GLUCOSE 136 (H) 03/11/2019   BUN 18 03/11/2019   CREATININE 1.80 (H) 03/11/2019   BILITOT 0.4 03/11/2019   ALKPHOS 127 (H) 03/11/2019   AST 13 03/11/2019   ALT 10 03/11/2019   PROT 7.1 03/11/2019   ALBUMIN 4.1 03/11/2019   CALCIUM 9.0 03/11/2019   ANIONGAP 8 12/22/2016   Lab Results  Component Value Date   CHOL 105 03/11/2019   Lab Results  Component Value Date   HDL 25 (L) 03/11/2019   Lab Results  Component Value Date   LDLCALC 54 03/11/2019   Lab Results  Component Value Date   TRIG 150 (H) 03/11/2019   Lab Results  Component Value Date   CHOLHDL 4.2 03/11/2019   Lab Results  Component Value Date   HGBA1C 6.3 03/11/2019       Assessment & Plan:   Problem List  Items Addressed This Visit      Cardiovascular and Mediastinum   CAD S/P percutaneous coronary angioplasty - Primary   Relevant Medications   torsemide (DEMADEX) 20 MG tablet   Other Relevant Orders   Lipid Panel (Completed)   PVD (peripheral vascular disease) (HCC)   Relevant Medications   torsemide (DEMADEX) 20 MG tablet   Other Relevant Orders   Lipid Panel (Completed)   Microalbumin / creatinine urine ratio (Completed)     Respiratory   Chronic nonseasonal allergic rhinitis due to pollen   Relevant Medications   cetirizine (ZYRTEC) 10 MG tablet     Endocrine   Type 2 diabetes mellitus with stage 2 chronic kidney disease, without long-term current use of insulin (HCC)   Relevant Orders   Microalbumin / creatinine urine ratio (Completed)   hgba1c (Completed)     Musculoskeletal and Integument   DDD (degenerative disc disease), lumbar   Relevant Medications  allopurinol (ZYLOPRIM) 100 MG tablet   cyclobenzaprine (FLEXERIL) 10 MG tablet     Other   Chronic gout without tophus   Relevant Medications   allopurinol (ZYLOPRIM) 100 MG tablet    Other Visit Diagnoses    Well adult exam       Relevant Orders   CBC with Differential/Platelet (Completed)   CMP14+EGFR (Completed)   Lipid Panel (Completed)   TSH (Completed)   PSA (Completed)       Meds ordered this encounter  Medications  . allopurinol (ZYLOPRIM) 100 MG tablet    Sig: Take 1 tablet (100 mg total) by mouth 3 (three) times daily.    Dispense:  270 tablet    Refill:  3    Order Specific Question:   Supervising Provider    Answer:   Janora Norlander [[id number]]  . cetirizine (ZYRTEC) 10 MG tablet    Sig: Take 1 tablet (10 mg total) by mouth daily as needed (for allergies.).    Dispense:  90 tablet    Refill:  3    Order Specific Question:   Supervising Provider    Answer:   Janora Norlander [[id number]]  . clopidogrel (PLAVIX) 75 MG tablet    Sig: Take 1 tablet (75 mg total) by mouth at bedtime.     Dispense:  90 tablet    Refill:  3    Order Specific Question:   Supervising Provider    Answer:   Janora Norlander [[id number]]  . cyclobenzaprine (FLEXERIL) 10 MG tablet    Sig: Take 1 tablet (10 mg total) by mouth 3 (three) times daily as needed. for muscle spams    Dispense:  90 tablet    Refill:  5    Order Specific Question:   Supervising Provider    Answer:   Janora Norlander [[id number]]  . ergocalciferol (VITAMIN D2) 1.25 MG (50000 UT) capsule    Sig: Take 1 capsule (50,000 Units total) by mouth once a week.    Dispense:  12 capsule    Refill:  3    Order Specific Question:   Supervising Provider    Answer:   Janora Norlander [[id number]]  . pantoprazole (PROTONIX) 40 MG tablet    Sig: Take 1 tablet (40 mg total) by mouth daily at 6 (six) AM.    Dispense:  30 tablet    Refill:  5    Order Specific Question:   Supervising Provider    Answer:   Janora Norlander [[id number]]  . torsemide (DEMADEX) 20 MG tablet    Sig: Take 1 tablet (20 mg total) by mouth 2 (two) times daily.    Dispense:  180 tablet    Refill:  1    Order Specific Question:   Supervising Provider    Answer:   Janora Norlander [[id number]]     Terald Sleeper, PA-C

## 2019-04-15 ENCOUNTER — Telehealth: Payer: Self-pay | Admitting: Cardiovascular Disease

## 2019-04-15 NOTE — Telephone Encounter (Signed)

## 2019-04-16 ENCOUNTER — Telehealth (INDEPENDENT_AMBULATORY_CARE_PROVIDER_SITE_OTHER): Payer: Medicare Other | Admitting: Cardiovascular Disease

## 2019-04-16 ENCOUNTER — Encounter: Payer: Self-pay | Admitting: Cardiovascular Disease

## 2019-04-16 VITALS — BP 121/61 | HR 97 | Ht 66.0 in | Wt 182.0 lb

## 2019-04-16 DIAGNOSIS — E785 Hyperlipidemia, unspecified: Secondary | ICD-10-CM | POA: Diagnosis not present

## 2019-04-16 DIAGNOSIS — I25118 Atherosclerotic heart disease of native coronary artery with other forms of angina pectoris: Secondary | ICD-10-CM | POA: Diagnosis not present

## 2019-04-16 DIAGNOSIS — N183 Chronic kidney disease, stage 3 unspecified: Secondary | ICD-10-CM

## 2019-04-16 DIAGNOSIS — Z720 Tobacco use: Secondary | ICD-10-CM

## 2019-04-16 DIAGNOSIS — I10 Essential (primary) hypertension: Secondary | ICD-10-CM | POA: Diagnosis not present

## 2019-04-16 DIAGNOSIS — I739 Peripheral vascular disease, unspecified: Secondary | ICD-10-CM

## 2019-04-16 DIAGNOSIS — I5032 Chronic diastolic (congestive) heart failure: Secondary | ICD-10-CM

## 2019-04-16 NOTE — Addendum Note (Signed)
Addended by: Debbora Lacrosse R on: 04/16/2019 02:57 PM   Modules accepted: Orders

## 2019-04-16 NOTE — Patient Instructions (Signed)

## 2019-04-16 NOTE — Progress Notes (Signed)
Virtual Visit via Telephone Note   This visit type was conducted due to national recommendations for restrictions regarding the COVID-19 Pandemic (e.g. social distancing) in an effort to limit this patient's exposure and mitigate transmission in our community.  Due to his co-morbid illnesses, this patient is at least at moderate risk for complications without adequate follow up.  This format is felt to be most appropriate for this patient at this time.  The patient did not have access to video technology/had technical difficulties with video requiring transitioning to audio format only (telephone).  All issues noted in this document were discussed and addressed.  No physical exam could be performed with this format.  Please refer to the patient's chart for his  consent to telehealth for [facility].   Date:  04/16/2019   ID:  Tommy Douglas, DOB 03/06/1951, MRN [id number]  Patient Location: Home Provider Location: Office  PCP:  Tommy Sleeper, PA-C  Cardiologist:  Kate Sable, MD  Electrophysiologist:  None   Evaluation Performed:  Follow-Up Visit  Chief Complaint:  coronary artery disease and chronic diastolic heart failure  History of Present Illness:    Tommy Douglas is a 69 y.o. male with coronary artery disease and chronic diastolic heart failure.  He denies shortness of breath, palpitations, and leg swelling. He has had right-sided chest pain.  He denies fevers and chills.  He has been having a lot of claudication pain. He has not gone back to see Dr. Gwenlyn Found.   Past Medical History:  Diagnosis Date  . Anxiety   . Arthritis    "qwhere" (12/20/2016)  . Chronic lower back pain   . CKD (chronic kidney disease), stage IV ([place])   . Complication of anesthesia    "got the wrong kind of anesthesia ~ 2000 when they were looking down into my stomach"  . Coronary artery disease   . Gout   . Heart disease   . Hyperlipidemia   . Hypertension   . Pneumonia ~ 2015  .  Seasonal allergies   . Type II diabetes mellitus ([place])    Past Surgical History:  Procedure Laterality Date  . CARDIAC CATHETERIZATION  12/20/2016  . CORONARY ANGIOPLASTY WITH STENT PLACEMENT  2004 X 2   "1st stent moved"  . CORONARY STENT INTERVENTION N/A 12/21/2016   Procedure: CORONARY STENT INTERVENTION;  Surgeon: Burnell Blanks, MD;  Location: [facility];  Service: Cardiovascular;  Laterality: N/A;  . ESOPHAGOGASTRODUODENOSCOPY  ~ 2000  . LAPAROSCOPIC CHOLECYSTECTOMY    . LEFT HEART CATH AND CORONARY ANGIOGRAPHY N/A 12/20/2016   Procedure: LEFT HEART CATH AND CORONARY ANGIOGRAPHY;  Surgeon: Burnell Blanks, MD;  Location: [facility];  Service: Cardiovascular;  Laterality: N/A;     Current Meds  Medication Sig  . acetaminophen (TYLENOL) 325 MG tablet Take 2 tablets (650 mg total) by mouth every 4 (four) hours as needed for headache or mild pain.  [name] allopurinol (ZYLOPRIM) 100 MG tablet Take 1 tablet (100 mg total) by mouth 3 (three) times daily.  [name] ALPRAZolam (XANAX) 0.25 MG tablet TAKE 1 TABLET TWICE DAILY AS NEEDED FOR ANXIETY  . aspirin EC 81 MG tablet Take 81 mg by mouth at bedtime.   . carvedilol (COREG) 25 MG tablet TAKE ONE TABLET BY MOUTH TWICE DAILY  . cetirizine (ZYRTEC) 10 MG tablet Take 1 tablet (10 mg total) by mouth daily as needed (for allergies.).  [name] clopidogrel (PLAVIX) 75 MG tablet Take 1  tablet (75 mg total) by mouth at bedtime.  . cyclobenzaprine (FLEXERIL) 10 MG tablet Take 1 tablet (10 mg total) by mouth 3 (three) times daily as needed. for muscle spams  . ergocalciferol (VITAMIN D2) 1.25 MG (50000 UT) capsule Take 1 capsule (50,000 Units total) by mouth once a week.  . ezetimibe (ZETIA) 10 MG tablet TAKE ONE (1) TABLET EACH DAY  . isosorbide mononitrate (IMDUR) 60 MG 24 hr tablet TAKE ONE (1) TABLET EACH DAY  . lisinopril (PRINIVIL,ZESTRIL) 5 MG tablet Take 5 mg by mouth 1 day or 1 dose.  . pantoprazole (PROTONIX) 40 MG tablet  Take 1 tablet (40 mg total) by mouth daily at 6 (six) AM.  . pravastatin (PRAVACHOL) 80 MG tablet TAKE 1 TABLET DAILY IN THE EVENING  . pregabalin (LYRICA) 75 MG capsule Take 1 capsule (75 mg total) by mouth 3 (three) times daily. New lower RENAL DOSING  . torsemide (DEMADEX) 20 MG tablet Take 1 tablet (20 mg total) by mouth 2 (two) times daily.  [name] ULTICARE MICRO PEN NEEDLES 32G X 4 MM MISC EVERY DAY  . VENTOLIN HFA 108 (90 Base) MCG/ACT inhaler USE 2 PUFFS EVERY 6 HOURS AS NEEDED  . VICTOZA 18 MG/3ML SOPN INJECT 0.3ML (1.8MG ) SQ DAILY     Allergies:   Atorvastatin, Statins, and Sulfa antibiotics   Social History   Tobacco Use  . Smoking status: Current Every Day Smoker    Packs/day: 0.50    Years: 44.00    Pack years: 22.00    Types: Cigarettes  . Smokeless tobacco: Never Used  Substance Use Topics  . Alcohol use: No  . Drug use: No     Family Hx: The patient's family history includes Alcohol abuse in his brother; Cancer in his daughter and mother; Diabetes in his mother; Other in his daughter, daughter, sister, sister, sister, and son; Stroke in his brother and father.  ROS:   Please see the history of present illness.     All other systems reviewed and are negative.   Prior CV studies:   The following studies were reviewed today:  Echo July 2019:  - Procedure narrative: Transthoracic echocardiography. Image   quality was adequate. The study was technically difficult, as a   result of poor patient compliance. - Left ventricle: The cavity size was normal. There was mild   concentric and moderate basal septal hypertrophy. Systolic   function was normal. The estimated ejection fraction was in the   range of 60% to 65%. Wall motion was normal; there were no   regional wall motion abnormalities. Doppler parameters are   consistent with abnormal left ventricular relaxation (grade 1   diastolic dysfunction). Doppler parameters are consistent with   indeterminate  ventricular filling pressure. - Aortic valve: Mildly calcified annulus. Trileaflet; normal   thickness leaflets. - Atrial septum: No defect or patent foramen ovale was identified. - Tricuspid valve: There was mild regurgitation. - Pericardium, extracardiac: A prominent pericardial fat pad was   present.  Labs/Other Tests and Data Reviewed:    EKG:  No ECG reviewed.  Recent Labs: 03/11/2019: ALT 10; BUN 18; Creatinine, Ser 1.80; Hemoglobin 13.8; Platelets 270; Potassium 3.7; Sodium 138; TSH 1.720   Recent Lipid Panel Lab Results  Component Value Date/Time   CHOL 105 03/11/2019 01:56 PM   TRIG 150 (H) 03/11/2019 01:56 PM   HDL 25 (L) 03/11/2019 01:56 PM   CHOLHDL 4.2 03/11/2019 01:56 PM   LDLCALC 54 03/11/2019 01:56 PM  Wt Readings from Last 3 Encounters:  04/16/19 182 lb (82.6 kg)  03/11/19 179 lb 9.6 oz (81.5 kg)  12/10/18 180 lb 9.6 oz (81.9 kg)     Objective:    Vital Signs:  BP 121/61   Pulse 97   Ht 5\' 6"  (1.676 m)   Wt 182 lb (82.6 kg)   BMI 29.38 kg/m    VITAL SIGNS:  reviewed  ASSESSMENT & PLAN:    1. Chronic diastolic heart failure: Symptomatically stable. No changes to therapy. Continue torsemide.  2. Coronary artery disease: Symptomatically stable. Continue aspirin, Plavix, Imdur, Coreg, and pravastatin. I may continue dual antiplatelet therapy indefinitely. He did not tolerate Lipitor nor Crestor in the past.  3. Hyperlipidemia: Continue pravastatin 80 mg and Zetia. He did not tolerate Lipitor nor Crestor in the past.  Lipids reviewed above.  LDL at goal.  4. Chronic kidney disease stage III: Creatinine 1.8 on 03/11/2019.  5. Peripheral vascular disease: On aspirin and statin. His claudication has gotten worse. He has a long history of tobacco abuse. ABIs showed aortoiliac atherosclerosis with greater than 50% stenosis of the bilateral external iliac arteries, left greater than right, 50-74% stenosis of the mid and distal right SFA,  30-49% stenosis of the left mid SFA, and 50-74% stenosis of the left distal SFA. There was two-vessel runoff bilaterally with both posterior tibial arteries being occluded. He needs tobacco cessation. He should follow up with Dr. Gwenlyn Found as previously planned. I will make certain he has an appt.  6. HTN: BP is normal.  No changes.  7. Tobacco abuse: Smokes 1 ppd.    COVID-19 Education: The signs and symptoms of COVID-19 were discussed with the patient and how to seek care for testing (follow up with PCP or arrange E-visit).  The importance of social distancing was discussed today.  Time:   Today, I have spent 5 minutes with the patient with telehealth technology discussing the above problems.     Medication Adjustments/Labs and Tests Ordered: Current medicines are reviewed at length with the patient today.  Concerns regarding medicines are outlined above.   Tests Ordered: No orders of the defined types were placed in this encounter.   Medication Changes: No orders of the defined types were placed in this encounter.   Follow Up:  Virtual Visit  in 6 month(s)  Signed, Kate Sable, MD  04/16/2019 1:52 PM    [facility]

## 2019-04-29 ENCOUNTER — Ambulatory Visit: Payer: Medicare Other | Admitting: Cardiovascular Disease

## 2019-05-02 ENCOUNTER — Ambulatory Visit: Payer: Medicare Other | Admitting: Cardiovascular Disease

## 2019-05-27 ENCOUNTER — Other Ambulatory Visit: Payer: Self-pay

## 2019-05-27 ENCOUNTER — Ambulatory Visit: Payer: Medicare Other | Admitting: Cardiovascular Disease

## 2019-05-27 ENCOUNTER — Encounter: Payer: Self-pay | Admitting: Cardiovascular Disease

## 2019-05-27 DIAGNOSIS — I739 Peripheral vascular disease, unspecified: Secondary | ICD-10-CM

## 2019-05-27 MED ORDER — SODIUM CHLORIDE 0.9% FLUSH: 3.0000 mL | Freq: Two times a day (BID) | INTRAVENOUS | Status: DC

## 2019-05-27 NOTE — Assessment & Plan Note (Signed)
Tommy Douglas was referred back to me by Dr. Bronson Ing  because of symptomatic PAD.  I last saw him in the office 12/27/2016 at which time he was symptomatic as well principally on the left side.  His left ABI was 0.89.  He had left external iliac artery stenosis.  His symptoms of gotten worse.  He does continue to smoke a pack a day.  He has CKD 3 with his serum creatinine in the 1.8 range on an ACE inhibitor and torsemide.  We will proceed with angiography in the next 1 to 2 weeks.  We will get lower extremity arterial Doppler studies on him prior to that.  He will hold his ACE inhibitor and torsemide 2 days prior to the procedure and he will be brought in early for hydration.

## 2019-05-27 NOTE — Patient Instructions (Addendum)
Medication Instructions:  Stop taking Torsemide and Lisinopril 2 days prior to procedure  If you need a refill on your cardiac medications before your next appointment, please call your pharmacy.   Lab work: CBC, BMET If you have labs (blood work) drawn today and your tests are completely normal, you will receive your results only by: [place] (if you have MyChart) OR A paper copy in the mail If you have any lab test that is abnormal or we need to change your treatment, we will call you to review the results.  Testing/Procedures:  Your physician has requested that you have a lower extremity arterial exercise duplex this week . During this test, exercise and ultrasound are used to evaluate arterial blood flow in the legs. Allow one hour for this exam. There are no restrictions or special instructions.  AND  Your physician has requested that you have an ankle brachial index (ABI) this week. During this test an ultrasound and blood pressure cuff are used to evaluate the arteries that supply the arms and legs with blood. Allow thirty minutes for this exam. There are no restrictions or special instructions.  Your physician has requested that you have a peripheral vascular angiogram on 06/02/2019. This exam is performed at the hospital. During this exam IV contrast is used to look at arterial blood flow. Please review the information sheet given for details.   ONE WEEK AFTER PROCEDURE REPEAT DOPPLERS  Follow-Up: At [facility], you and your health needs are our priority.  As part of our continuing mission to provide you with exceptional heart care, we have created designated Provider Care Teams.  These Care Teams include your primary Cardiologist (physician) and Advanced Practice Providers (APPs -  Physician Assistants and Nurse Practitioners) who all work together to provide you with the care you need, when you need it. You may see Dr. Gwenlyn Found or one of the following Advanced Practice  Providers on your designated Care Team:    Kerin Ransom, PA-C  [place], [place]  Coletta Memos, [place]  Your physician wants you to follow-up in: 2 weeks after procedure  Any Other Special Instructions Will Be Listed Below (If Applicable).  You are scheduled for a Peripheral Angiogram on Monday, March 8 with Dr. Quay Burow.  1. Please arrive at the [facility] (Main Entrance A) at [facility]: [address] [place], [place] [address] at 5:30 AM (This time is two hours before your procedure to ensure your preparation). Free valet parking service is available.   Special note: Every effort is made to have your procedure done on time. Please understand that emergencies sometimes delay scheduled procedures.  2. Diet: Do not eat solid foods after midnight.  The patient may have clear liquids until 5am upon the day of the procedure.  3. Labs: You will need to have blood drawn today.  4. Medication instructions in preparation for your procedure:   Contrast Allergy: No Stop taking Torsemide and Lisinopril 2 days prior to procedure  On the morning of your procedure, take your Plavix/Clopidogrel and any morning medicines NOT listed above.  You may use sips of water.  5. Plan for one night stay--bring personal belongings. 6. Bring a current list of your medications and current insurance cards. 7. You MUST have a responsible person to drive you home. 8. Someone MUST be with you the first 24 hours after you arrive home or your discharge will be delayed. 9. Please wear clothes that are easy to get  on and off and wear slip-on shoes.  YOU HAVE TO BE TESTED FOR COVID PRIOR TO THIS PROCEDURE AND QUARANTINE FOR 3 DAYS. THIS WILL BE SCHEDULED ON 05/30/2019 at 12:50pm. This is a Drive Up Visit at the [facility] [address], [place]. Someone will direct you to the appropriate testing line. Stay in your car and someone will be with you shortly.   Thank you for allowing  [place] to care for you!   -- [facility]

## 2019-05-27 NOTE — H&P (View-Only) (Signed)
05/27/2019 GOVIND LYSAGHT   September 15, 1950  [id number]  Primary Physician Terald Sleeper, PA-C Primary Cardiologist: Lorretta Harp MD Garret Reddish, [place], [place]  HPI:  Tommy Douglas is a 69 y.o.  divorced African-American male father of 37, grandfather of 9 grandchildren is accompanied by his girlfriend Tommy Douglas. He was referred by Dr. Jacinta Shoe  for peripheral vascular evaluation because of lifestyle limiting medication.  I last saw him in the office 12/27/2016.  His cardiac risk factors are notable for ongoing tobacco abuse one pack per day for the last 45 years, treated hypertension, diabetes and hyperlipidemia. He had a myocardial infarction 15 years ago but has never had a stroke. He recently had circumflex intervention by Dr. Roxine Caddy several days ago. He currently denies chest pain or shortness of breath. He does complain of left lower extremity claudication which is lifestyle limiting a recent Doppler in our office performed 11/30/16 revealed high-frequency signal in his left external iliac artery with a left ABI 0.89.  We were planning on performing angiography and intervention but unfortunately his girlfriend had a stroke and he needed to take care of her and therefore put off his intervention.  Now he is back with worsening claudication and wishes to proceed with angiography and intervention.   Current Meds  Medication Sig  . acetaminophen (TYLENOL) 325 MG tablet Take 2 tablets (650 mg total) by mouth every 4 (four) hours as needed for headache or mild pain.  [name] allopurinol (ZYLOPRIM) 100 MG tablet Take 1 tablet (100 mg total) by mouth 3 (three) times daily.  [name] ALPRAZolam (XANAX) 0.25 MG tablet TAKE 1 TABLET TWICE DAILY AS NEEDED FOR ANXIETY  . aspirin EC 81 MG tablet Take 81 mg by mouth at bedtime.   . carvedilol (COREG) 25 MG tablet TAKE ONE TABLET BY MOUTH TWICE DAILY  . cetirizine (ZYRTEC) 10 MG tablet Take 1 tablet (10 mg total) by mouth daily as needed (for allergies.).  [name]  clopidogrel (PLAVIX) 75 MG tablet Take 1 tablet (75 mg total) by mouth at bedtime.  . cyclobenzaprine (FLEXERIL) 10 MG tablet Take 1 tablet (10 mg total) by mouth 3 (three) times daily as needed. for muscle spams  . ergocalciferol (VITAMIN D2) 1.25 MG (50000 UT) capsule Take 1 capsule (50,000 Units total) by mouth once a week.  . ezetimibe (ZETIA) 10 MG tablet TAKE ONE (1) TABLET EACH DAY  . isosorbide mononitrate (IMDUR) 60 MG 24 hr tablet TAKE ONE (1) TABLET EACH DAY  . lisinopril (PRINIVIL,ZESTRIL) 5 MG tablet Take 5 mg by mouth 1 day or 1 dose.  . nitroGLYCERIN (NITROSTAT) 0.4 MG SL tablet Place 1 tablet (0.4 mg total) under the tongue every 5 (five) minutes as needed for chest pain.  . pantoprazole (PROTONIX) 40 MG tablet Take 1 tablet (40 mg total) by mouth daily at 6 (six) AM.  . pravastatin (PRAVACHOL) 80 MG tablet TAKE 1 TABLET DAILY IN THE EVENING  . pregabalin (LYRICA) 75 MG capsule Take 1 capsule (75 mg total) by mouth 3 (three) times daily. New lower RENAL DOSING  . torsemide (DEMADEX) 20 MG tablet Take 1 tablet (20 mg total) by mouth 2 (two) times daily.  [name] ULTICARE MICRO PEN NEEDLES 32G X 4 MM MISC EVERY DAY  . VENTOLIN HFA 108 (90 Base) MCG/ACT inhaler USE 2 PUFFS EVERY 6 HOURS AS NEEDED  . VICTOZA 18 MG/3ML SOPN INJECT 0.3ML (1.8MG ) SQ DAILY     Allergies  Allergen Reactions  . Atorvastatin  All statins make him cough  . Statins Cough    Pt is currently taking pravastatin   . Sulfa Antibiotics     Social History   Socioeconomic History  . Marital status: Widowed    Spouse name: Not on file  . Number of children: 7  . Years of education: Not on file  . Highest education level: High school graduate  Occupational History  . Occupation: retired     Comment: Designer, television/film set and Manufacturing engineer co.  Tobacco Use  . Smoking status: Current Every Day Smoker    Packs/day: 0.50    Years: 44.00    Pack years: 22.00    Types: Cigarettes  . Smokeless tobacco: Never  Used  Substance and Sexual Activity  . Alcohol use: No  . Drug use: No  . Sexual activity: Not on file  Other Topics Concern  . Not on file  Social History Narrative  . Not on file   Social Determinants of Health   Financial Resource Strain: Medium Risk  . Difficulty of Paying Living Expenses: Somewhat hard  Food Insecurity: No Food Insecurity  . Worried About Charity fundraiser in the Last Year: Never true  . Ran Out of Food in the Last Year: Never true  Transportation Needs: No Transportation Needs  . Lack of Transportation (Medical): No  . Lack of Transportation (Non-Medical): No  Physical Activity: Inactive  . Days of Exercise per Week: 0 days  . Minutes of Exercise per Session: 0 min  Stress: Stress Concern Present  . Feeling of Stress : To some extent  Social Connections: Somewhat Isolated  . Frequency of Communication with Friends and Family: More than three times a week  . Frequency of Social Gatherings with Friends and Family: More than three times a week  . Attends Religious Services: Never  . Active Member of Clubs or Organizations: No  . Attends Archivist Meetings: Never  . Marital Status: Living with partner  Intimate Partner Violence: Not At Risk  . Fear of Current or Ex-Partner: No  . Emotionally Abused: No  . Physically Abused: No  . Sexually Abused: No     Review of Systems: General: negative for chills, fever, night sweats or weight changes.  Cardiovascular: negative for chest pain, dyspnea on exertion, edema, orthopnea, palpitations, paroxysmal nocturnal dyspnea or shortness of breath Dermatological: negative for rash Respiratory: negative for cough or wheezing Urologic: negative for hematuria Abdominal: negative for nausea, vomiting, diarrhea, bright red blood per rectum, melena, or hematemesis Neurologic: negative for visual changes, syncope, or dizziness All other systems reviewed and are otherwise negative except as noted  above.    Blood pressure 140/78, pulse 94, temperature 98.3 F (36.8 C), height 5\' 6"  (1.676 m), weight 176 lb (79.8 kg).  General appearance: alert and no distress Neck: no adenopathy, no carotid bruit, no JVD, supple, symmetrical, trachea midline and thyroid not enlarged, symmetric, no tenderness/mass/nodules Lungs: clear to auscultation bilaterally Heart: regular rate and rhythm, S1, S2 normal, no murmur, click, rub or gallop Extremities: extremities normal, atraumatic, no cyanosis or edema Pulses: 2+ and symmetric Skin: Skin color, texture, turgor normal. No rashes or lesions Neurologic: Alert and oriented X 3, normal strength and tone. Normal symmetric reflexes. Normal coordination and gait  EKG sinus rhythm at 94 without ST or T wave changes.  I personally reviewed this EKG.  ASSESSMENT AND PLAN:   PVD (peripheral vascular disease) [facility]) Mr. Hodo was referred back to me by Dr.  Koneswaran  because of symptomatic PAD.  I last saw him in the office 12/27/2016 at which time he was symptomatic as well principally on the left side.  His left ABI was 0.89.  He had left external iliac artery stenosis.  His symptoms of gotten worse.  He does continue to smoke a pack a day.  He has CKD 3 with his serum creatinine in the 1.8 range on an ACE inhibitor and torsemide.  We will proceed with angiography in the next 1 to 2 weeks.  We will get lower extremity arterial Doppler studies on him prior to that.  He will hold his ACE inhibitor and torsemide 2 days prior to the procedure and he will be brought in early for hydration.      Lorretta Harp MD FACP,FACC,FAHA, [facility] 05/27/2019 2:59 PM

## 2019-05-27 NOTE — Progress Notes (Signed)
05/27/2019 Tommy Douglas   22-May-1950  [id number]  Primary Physician Tommy Sleeper, PA-C Primary Cardiologist: Tommy Harp MD Tommy Douglas, [place], [place]  HPI:  Tommy Douglas is a 69 y.o.  divorced African-American male father of 34, grandfather of 9 grandchildren is accompanied by his girlfriend Tommy Douglas. He was referred by Dr. Jacinta Douglas  for peripheral vascular evaluation because of lifestyle limiting medication.  I last saw him in the office 12/27/2016.  His cardiac risk factors are notable for ongoing tobacco abuse one pack per day for the last 45 years, treated hypertension, diabetes and hyperlipidemia. He had a myocardial infarction 15 years ago but has never had a stroke. He recently had circumflex intervention by Dr. Roxine Douglas several days ago. He currently denies chest pain or shortness of breath. He does complain of left lower extremity claudication which is lifestyle limiting a recent Doppler in our office performed 11/30/16 revealed high-frequency signal in his left external iliac artery with a left ABI 0.89.  We were planning on performing angiography and intervention but unfortunately his girlfriend had a stroke and he needed to take care of her and therefore put off his intervention.  Now he is back with worsening claudication and wishes to proceed with angiography and intervention.   Current Meds  Medication Sig  . acetaminophen (TYLENOL) 325 MG tablet Take 2 tablets (650 mg total) by mouth every 4 (four) hours as needed for headache or mild pain.  [name] allopurinol (ZYLOPRIM) 100 MG tablet Take 1 tablet (100 mg total) by mouth 3 (three) times daily.  [name] ALPRAZolam (XANAX) 0.25 MG tablet TAKE 1 TABLET TWICE DAILY AS NEEDED FOR ANXIETY  . aspirin EC 81 MG tablet Take 81 mg by mouth at bedtime.   . carvedilol (COREG) 25 MG tablet TAKE ONE TABLET BY MOUTH TWICE DAILY  . cetirizine (ZYRTEC) 10 MG tablet Take 1 tablet (10 mg total) by mouth daily as needed (for allergies.).  [name]  clopidogrel (PLAVIX) 75 MG tablet Take 1 tablet (75 mg total) by mouth at bedtime.  . cyclobenzaprine (FLEXERIL) 10 MG tablet Take 1 tablet (10 mg total) by mouth 3 (three) times daily as needed. for muscle spams  . ergocalciferol (VITAMIN D2) 1.25 MG (50000 UT) capsule Take 1 capsule (50,000 Units total) by mouth once a week.  . ezetimibe (ZETIA) 10 MG tablet TAKE ONE (1) TABLET EACH DAY  . isosorbide mononitrate (IMDUR) 60 MG 24 hr tablet TAKE ONE (1) TABLET EACH DAY  . lisinopril (PRINIVIL,ZESTRIL) 5 MG tablet Take 5 mg by mouth 1 day or 1 dose.  . nitroGLYCERIN (NITROSTAT) 0.4 MG SL tablet Place 1 tablet (0.4 mg total) under the tongue every 5 (five) minutes as needed for chest pain.  . pantoprazole (PROTONIX) 40 MG tablet Take 1 tablet (40 mg total) by mouth daily at 6 (six) AM.  . pravastatin (PRAVACHOL) 80 MG tablet TAKE 1 TABLET DAILY IN THE EVENING  . pregabalin (LYRICA) 75 MG capsule Take 1 capsule (75 mg total) by mouth 3 (three) times daily. New lower RENAL DOSING  . torsemide (DEMADEX) 20 MG tablet Take 1 tablet (20 mg total) by mouth 2 (two) times daily.  [name] ULTICARE MICRO PEN NEEDLES 32G X 4 MM MISC EVERY DAY  . VENTOLIN HFA 108 (90 Base) MCG/ACT inhaler USE 2 PUFFS EVERY 6 HOURS AS NEEDED  . VICTOZA 18 MG/3ML SOPN INJECT 0.3ML (1.8MG ) SQ DAILY     Allergies  Allergen Reactions  . Atorvastatin  All statins make him cough  . Statins Cough    Pt is currently taking pravastatin   . Sulfa Antibiotics     Social History   Socioeconomic History  . Marital status: Widowed    Spouse name: Not on file  . Number of children: 7  . Years of education: Not on file  . Highest education level: High school graduate  Occupational History  . Occupation: retired     Comment: Designer, television/film set and Manufacturing engineer co.  Tobacco Use  . Smoking status: Current Every Day Smoker    Packs/day: 0.50    Years: 44.00    Pack years: 22.00    Types: Cigarettes  . Smokeless tobacco: Never  Used  Substance and Sexual Activity  . Alcohol use: No  . Drug use: No  . Sexual activity: Not on file  Other Topics Concern  . Not on file  Social History Narrative  . Not on file   Social Determinants of Health   Financial Resource Strain: Medium Risk  . Difficulty of Paying Living Expenses: Somewhat hard  Food Insecurity: No Food Insecurity  . Worried About Charity fundraiser in the Last Year: Never true  . Ran Out of Food in the Last Year: Never true  Transportation Needs: No Transportation Needs  . Lack of Transportation (Medical): No  . Lack of Transportation (Non-Medical): No  Physical Activity: Inactive  . Days of Exercise per Week: 0 days  . Minutes of Exercise per Session: 0 min  Stress: Stress Concern Present  . Feeling of Stress : To some extent  Social Connections: Somewhat Isolated  . Frequency of Communication with Friends and Family: More than three times a week  . Frequency of Social Gatherings with Friends and Family: More than three times a week  . Attends Religious Services: Never  . Active Member of Clubs or Organizations: No  . Attends Archivist Meetings: Never  . Marital Status: Living with partner  Intimate Partner Violence: Not At Risk  . Fear of Current or Ex-Partner: No  . Emotionally Abused: No  . Physically Abused: No  . Sexually Abused: No     Review of Systems: General: negative for chills, fever, night sweats or weight changes.  Cardiovascular: negative for chest pain, dyspnea on exertion, edema, orthopnea, palpitations, paroxysmal nocturnal dyspnea or shortness of breath Dermatological: negative for rash Respiratory: negative for cough or wheezing Urologic: negative for hematuria Abdominal: negative for nausea, vomiting, diarrhea, bright red blood per rectum, melena, or hematemesis Neurologic: negative for visual changes, syncope, or dizziness All other systems reviewed and are otherwise negative except as noted above.     Blood pressure 140/78, pulse 94, temperature 98.3 F (36.8 C), height 5\' 6"  (1.676 m), weight 176 lb (79.8 kg).  General appearance: alert and no distress Neck: no adenopathy, no carotid bruit, no JVD, supple, symmetrical, trachea midline and thyroid not enlarged, symmetric, no tenderness/mass/nodules Lungs: clear to auscultation bilaterally Heart: regular rate and rhythm, S1, S2 normal, no murmur, click, rub or gallop Extremities: extremities normal, atraumatic, no cyanosis or edema Pulses: 2+ and symmetric Skin: Skin color, texture, turgor normal. No rashes or lesions Neurologic: Alert and oriented X 3, normal strength and tone. Normal symmetric reflexes. Normal coordination and gait  EKG sinus rhythm at 94 without ST or T wave changes.  I personally reviewed this EKG.  ASSESSMENT AND PLAN:   PVD (peripheral vascular disease) [facility]) Mr. Halbrooks was referred back to me by Dr.  Koneswaran  because of symptomatic PAD.  I last saw him in the office 12/27/2016 at which time he was symptomatic as well principally on the left side.  His left ABI was 0.89.  He had left external iliac artery stenosis.  His symptoms of gotten worse.  He does continue to smoke a pack a day.  He has CKD 3 with his serum creatinine in the 1.8 range on an ACE inhibitor and torsemide.  We will proceed with angiography in the next 1 to 2 weeks.  We will get lower extremity arterial Doppler studies on him prior to that.  He will hold his ACE inhibitor and torsemide 2 days prior to the procedure and he will be brought in early for hydration.      Tommy Harp MD FACP,FACC,FAHA, [facility] 05/27/2019 2:59 PM

## 2019-05-28 ENCOUNTER — Ambulatory Visit (INDEPENDENT_AMBULATORY_CARE_PROVIDER_SITE_OTHER): Payer: Medicare Other | Admitting: *Deleted

## 2019-05-28 DIAGNOSIS — Z0000 Encounter for general adult medical examination without abnormal findings: Secondary | ICD-10-CM

## 2019-05-28 LAB — CBC
Hematocrit: 42.7 % (ref 37.5–51.0)
Hemoglobin: 14 g/dL (ref 13.0–17.7)
MCH: 28.1 pg (ref 26.6–33.0)
MCHC: 32.8 g/dL (ref 31.5–35.7)
MCV: 86 fL (ref 79–97)
Platelets: 259 10*3/uL (ref 150–450)
RBC: 4.98 x10E6/uL (ref 4.14–5.80)
RDW: 13.6 % (ref 11.6–15.4)
WBC: 7.9 10*3/uL (ref 3.4–10.8)

## 2019-05-28 LAB — BASIC METABOLIC PANEL
BUN/Creatinine Ratio: 8 — ABNORMAL LOW (ref 10–24)
BUN: 12 mg/dL (ref 8–27)
CO2: 29 mmol/L (ref 20–29)
Calcium: 9.9 mg/dL (ref 8.6–10.2)
Chloride: 100 mmol/L (ref 96–106)
Creatinine, Ser: 1.6 mg/dL — ABNORMAL HIGH (ref 0.76–1.27)
GFR calc Af Amer: 50 mL/min/{1.73_m2} — ABNORMAL LOW (ref >59)
GFR calc non Af Amer: 44 mL/min/{1.73_m2} — ABNORMAL LOW (ref >59)
Glucose: 115 mg/dL — ABNORMAL HIGH (ref 65–99)
Potassium: 4.2 mmol/L (ref 3.5–5.2)
Sodium: 143 mmol/L (ref 134–144)

## 2019-05-28 NOTE — Progress Notes (Signed)
MEDICARE ANNUAL WELLNESS VISIT  05/28/2019  Telephone Visit Disclaimer This Medicare AWV was conducted by telephone due to national recommendations for restrictions regarding the COVID-19 Pandemic (e.g. social distancing).  I verified, using two identifiers, that I am speaking with Tommy Douglas or their authorized healthcare agent. I discussed the limitations, risks, security, and privacy concerns of performing an evaluation and management service by telephone and the potential availability of an in-person appointment in the future. The patient expressed understanding and agreed to proceed.   Subjective:  Tommy Douglas is a 69 y.o. male patient of Terald Sleeper, PA-C who had a Medicare Annual Wellness Visit today via telephone. Tommy Douglas is retired and lives with his girlfriend and his son. He has 7 children, 5 live locally and he sees regularly. He reports that he is socially active and does interact with friends/family regularly. he is not physically active and enjoys watching tv.  Patient Care Team: Theodoro Clock as PCP - General (Physician Assistant) Herminio Commons, MD as PCP - Cardiology (Cardiology) Herminio Commons, MD as Attending Physician (Cardiology) Lorretta Harp, MD as Consulting Physician (Cardiology) Fran Lowes, MD (Inactive) as Consulting Physician (Nephrology) Daneil Dolin, MD as Consulting Physician (Gastroenterology)  Advanced Directives 05/28/2019 10/22/2018 05/27/2018 05/24/2017 12/20/2016 12/20/2016  Does Patient Have a Medical Advance Directive? Yes Yes Yes No - Yes  Type of [employer] [place];Living will - [place];Living will [place];Living will  Does patient want to make changes to medical advance directive? No - Patient declined - No - Patient declined - No - Patient declined No - Patient declined  Copy of [place]  in Chart? - - No - copy requested - No - copy requested No - copy requested  Would patient like information on creating a medical advance directive? - - - Yes (MAU/Ambulatory/Procedural Areas - Information given) - -    Hospital Utilization Over the Past 12 Months: # of hospitalizations or ER visits: 0 # of surgeries: 0  Review of Systems    Patient reports that his overall health is unchanged compared to last year.  History obtained from the patient.  Patient Reported Readings (BP, Pulse, CBG, Weight, etc) none  Pain Assessment Pain : 0-10 Pain Score: 8  Pain Type: Chronic pain Pain Location: Back Pain Orientation: Lower Pain Descriptors / Indicators: Dull Pain Onset: More than a month ago Pain Frequency: Several days a week Pain Relieving Factors: PAIN MED Effect of Pain on Daily Activities: MAKES DAILY ACTIVITIES DIFFICULT  Pain Relieving Factors: PAIN MED  Current Medications & Allergies (verified) Allergies as of 05/28/2019      Reactions   Atorvastatin    All statins make him cough   Statins Cough   Pt is currently taking pravastatin    Sulfa Antibiotics       Medication List       Accurate as of May 28, 2019  9:01 AM. If you have any questions, ask your nurse or doctor.        acetaminophen 325 MG tablet Commonly known as: TYLENOL Take 2 tablets (650 mg total) by mouth every 4 (four) hours as needed for headache or mild pain.   allopurinol 100 MG tablet Commonly known as: ZYLOPRIM Take 1 tablet (100 mg total) by mouth 3 (three) times daily.   ALPRAZolam 0.25 MG tablet Commonly known as: XANAX TAKE 1  TABLET TWICE DAILY AS NEEDED FOR ANXIETY   aspirin EC 81 MG tablet Take 81 mg by mouth at bedtime.   carvedilol 25 MG tablet Commonly known as: COREG TAKE ONE TABLET BY MOUTH TWICE DAILY   cetirizine 10 MG tablet Commonly known as: ZYRTEC Take 1 tablet (10 mg total) by mouth daily as needed (for allergies.).   clopidogrel 75 MG tablet Commonly  known as: PLAVIX Take 1 tablet (75 mg total) by mouth at bedtime.   cyclobenzaprine 10 MG tablet Commonly known as: FLEXERIL Take 1 tablet (10 mg total) by mouth 3 (three) times daily as needed. for muscle spams   ergocalciferol 1.25 MG (50000 UT) capsule Commonly known as: VITAMIN D2 Take 1 capsule (50,000 Units total) by mouth once a week.   ezetimibe 10 MG tablet Commonly known as: ZETIA TAKE ONE (1) TABLET EACH DAY   isosorbide mononitrate 60 MG 24 hr tablet Commonly known as: IMDUR TAKE ONE (1) TABLET EACH DAY   lisinopril 5 MG tablet Commonly known as: ZESTRIL Take 5 mg by mouth 1 day or 1 dose.   nitroGLYCERIN 0.4 MG SL tablet Commonly known as: NITROSTAT Place 1 tablet (0.4 mg total) under the tongue every 5 (five) minutes as needed for chest pain.   pantoprazole 40 MG tablet Commonly known as: PROTONIX Take 1 tablet (40 mg total) by mouth daily at 6 (six) AM.   pravastatin 80 MG tablet Commonly known as: PRAVACHOL TAKE 1 TABLET DAILY IN THE EVENING   pregabalin 75 MG capsule Commonly known as: LYRICA Take 1 capsule (75 mg total) by mouth 3 (three) times daily. New lower RENAL DOSING   torsemide 20 MG tablet Commonly known as: DEMADEX Take 1 tablet (20 mg total) by mouth 2 (two) times daily.   UltiCare Micro Pen Needles 32G X 4 MM Misc Generic drug: Insulin Pen Needle EVERY DAY   Ventolin HFA 108 (90 Base) MCG/ACT inhaler Generic drug: albuterol USE 2 PUFFS EVERY 6 HOURS AS NEEDED   Victoza 18 MG/3ML Sopn Generic drug: liraglutide INJECT 0.3ML (1.8MG ) SQ DAILY       History (reviewed): Past Medical History:  Diagnosis Date  . Anxiety   . Arthritis    "qwhere" (12/20/2016)  . Chronic lower back pain   . CKD (chronic kidney disease), stage IV ([place])   . Complication of anesthesia    "got the wrong kind of anesthesia ~ 2000 when they were looking down into my stomach"  . Coronary artery disease   . Gout   . Heart disease   . Hyperlipidemia     . Hypertension   . Pneumonia ~ 2015  . Seasonal allergies   . Type II diabetes mellitus ([place])    Past Surgical History:  Procedure Laterality Date  . CARDIAC CATHETERIZATION  12/20/2016  . CORONARY ANGIOPLASTY WITH STENT PLACEMENT  2004 X 2   "1st stent moved"  . CORONARY STENT INTERVENTION N/A 12/21/2016   Procedure: CORONARY STENT INTERVENTION;  Surgeon: Burnell Blanks, MD;  Location: [facility];  Service: Cardiovascular;  Laterality: N/A;  . ESOPHAGOGASTRODUODENOSCOPY  ~ 2000  . LAPAROSCOPIC CHOLECYSTECTOMY    . LEFT HEART CATH AND CORONARY ANGIOGRAPHY N/A 12/20/2016   Procedure: LEFT HEART CATH AND CORONARY ANGIOGRAPHY;  Surgeon: Burnell Blanks, MD;  Location: [facility];  Service: Cardiovascular;  Laterality: N/A;   Family History  Problem Relation Age of Onset  . Cancer Mother        male  .  Diabetes Mother   . Stroke Father        x3  . Stroke Brother        x2   . Other Sister        bowel necrosis?   . Other Daughter        gout  . Other Son        gout  . Alcohol abuse Brother   . Other Sister        pneumonia  . Other Sister        pneumonia  . Cancer Daughter        male  . Other Daughter        fluid on brain - disabled    Social History   Socioeconomic History  . Marital status: Widowed    Spouse name: Not on file  . Number of children: 7  . Years of education: Not on file  . Highest education level: High school graduate  Occupational History  . Occupation: retired     Comment: Designer, television/film set and Manufacturing engineer co.  Tobacco Use  . Smoking status: Current Every Day Smoker    Packs/day: 0.50    Years: 44.00    Pack years: 22.00    Types: Cigarettes  . Smokeless tobacco: Never Used  Substance and Sexual Activity  . Alcohol use: No  . Drug use: No  . Sexual activity: Not on file  Other Topics Concern  . Not on file  Social History Narrative  . Not on file   Social Determinants of Health    Financial Resource Strain:   . Difficulty of Paying Living Expenses: Not on file  Food Insecurity:   . Worried About Charity fundraiser in the Last Year: Not on file  . Ran Out of Food in the Last Year: Not on file  Transportation Needs:   . Lack of Transportation (Medical): Not on file  . Lack of Transportation (Non-Medical): Not on file  Physical Activity:   . Days of Exercise per Week: Not on file  . Minutes of Exercise per Session: Not on file  Stress:   . Feeling of Stress : Not on file  Social Connections:   . Frequency of Communication with Friends and Family: Not on file  . Frequency of Social Gatherings with Friends and Family: Not on file  . Attends Religious Services: Not on file  . Active Member of Clubs or Organizations: Not on file  . Attends Archivist Meetings: Not on file  . 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 N  Difficulty concentrating or making decisions? N  Walking or climbing stairs? Y  Comment hurts to climb stairs  Dressing or bathing? N  Doing errands, shopping? N  Preparing Food and eating ? N  Using the Toilet? N  In the past six months, have you accidently leaked urine? N  Do you have problems with loss of bowel control? N  Managing your Medications? N  Managing your Finances? N  Housekeeping or managing your Housekeeping? N  Some recent data might be hidden    Patient Education/ Literacy How often do you need to have someone help you when you read instructions, pamphlets, or other written materials from your doctor or pharmacy?: 3 - Sometimes What is the  last grade level you completed in school?: 12  Exercise Current Exercise Habits: The patient does not participate in regular exercise at present, Exercise limited by: orthopedic condition(s)(back  pain)  Diet Patient reports consuming 1 meals a day and 3 snack(s) a day Patient reports that his primary diet is: Regular Patient reports that she does have regular access to food.   Depression Screen PHQ 2/9 Scores 05/28/2019 03/11/2019 12/10/2018 08/27/2018 05/27/2018 05/02/2018 01/29/2018  PHQ - 2 Score 0 0 0 0 0 0 0     Fall Risk Fall Risk  05/28/2019 03/11/2019 12/10/2018 08/27/2018 05/27/2018  Falls in the past year? 1 1 1  0 0  Number falls in past yr: 0 1 0 - -  Injury with Fall? 0 1 0 - -  Risk for fall due to : History of fall(s);Impaired balance/gait - - - -  Follow up Falls prevention discussed Falls prevention discussed - - -     Objective:  Tommy Douglas seemed alert and oriented and he participated appropriately during our telephone visit.  Blood Pressure Weight BMI  BP Readings from Last 3 Encounters:  05/27/19 140/78  04/16/19 121/61  03/11/19 133/72   Wt Readings from Last 3 Encounters:  05/27/19 176 lb (79.8 kg)  04/16/19 182 lb (82.6 kg)  03/11/19 179 lb 9.6 oz (81.5 kg)   BMI Readings from Last 1 Encounters:  05/27/19 28.41 kg/m    *Unable to obtain current vital signs, weight, and BMI due to telephone visit type  Hearing/Vision  . Tommy Douglas did not seem to have difficulty with hearing/understanding during the telephone conversation . Reports that he has not had a formal eye exam by an eye care professional within the past year . Reports that he has had a formal hearing evaluation within the past year *Unable to fully assess hearing and vision during telephone visit type  Cognitive Function: 6CIT Screen 05/28/2019  What Year? 0 points  What month? 0 points  What time? 0 points  Count back from 20 0 points  Months in reverse 0 points  Repeat phrase 0 points  Total Score 0   (Normal:0-7, Significant for Dysfunction: >8)  Normal Cognitive Function Screening: Yes   Immunization & Health Maintenance Record Immunization History  Administered Date(s)  Administered  . Influenza, High Dose Seasonal PF 01/10/2016, 02/05/2017, 01/29/2018  . Pneumococcal Conjugate-13 03/17/2015, 05/27/2018    Health Maintenance  Topic Date Due  . Hepatitis C Screening  [birth year]  . FOOT EXAM  09/08/1960  . OPHTHALMOLOGY EXAM  09/08/1960  . COLONOSCOPY  09/08/2000  . INFLUENZA VACCINE  10/26/2018  . PNA vac Low Risk Adult (2 of 2 - PPSV23) 05/27/2019  . TETANUS/TDAP  12/10/2019 (Originally 09/08/1969)  . HEMOGLOBIN A1C  09/09/2019       Assessment  This is a routine wellness examination for Tommy Douglas.  Health Maintenance: Due or Overdue Health Maintenance Due  Topic Date Due  . Hepatitis C Screening  11-27-1950  . FOOT EXAM  09/08/1960  . OPHTHALMOLOGY EXAM  09/08/1960  . COLONOSCOPY  09/08/2000  . INFLUENZA VACCINE  10/26/2018  . PNA vac Low Risk Adult (2 of 2 - PPSV23) 05/27/2019    Tommy Douglas does not need a referral for Community Assistance: Care Management:   no Social Work:    no Prescription Assistance:  no Nutrition/Diabetes Education:  no   Plan:  Personalized Goals Goals Addressed   None    Personalized Health Maintenance & Screening Recommendations  Colorectal cancer screening, Eye Exam, Foot Exam  Lung Cancer Screening Recommended: yes (Low Dose CT Chest recommended if Age 55-80 years, 30 pack-year currently smoking OR have quit w/in past 15 years) Hepatitis C Screening recommended: yes HIV Screening recommended: no  Advanced Directives: Written information was not prepared per patient's request.  Referrals & Orders No orders of the defined types were placed in this encounter.   Follow-up Plan . Follow-up with Terald Sleeper, PA-C as planned . Schedule Eye Exam, Colon Cancer Screening .    I have personally reviewed and noted the following in the patient's chart:   . Medical and social history . Use of alcohol, tobacco or illicit drugs  . Current medications and supplements . Functional ability  and status . Nutritional status . Physical activity . Advanced directives . List of other physicians . Hospitalizations, surgeries, and ER visits in previous 12 months . Vitals . Screenings to include cognitive, depression, and falls . Referrals and appointments  In addition, I have reviewed and discussed with Tommy Douglas certain preventive protocols, quality metrics, and best practice recommendations. A written personalized care plan for preventive services as well as general preventive health recommendations is available and can be mailed to the patient at his request.      Tommy Lamy, LPN [phone]

## 2019-05-29 ENCOUNTER — Ambulatory Visit (HOSPITAL_COMMUNITY)
Admission: RE | Admit: 2019-05-29 | Discharge: 2019-05-29 | Disposition: A | Discharge status: Home / Self Care | Payer: Medicare Other | Source: Ambulatory Visit | Attending: Cardiology | Admitting: Cardiology

## 2019-05-29 ENCOUNTER — Other Ambulatory Visit: Payer: Self-pay

## 2019-05-29 ENCOUNTER — Telehealth: Payer: Self-pay | Admitting: *Deleted

## 2019-05-29 ENCOUNTER — Ambulatory Visit (HOSPITAL_COMMUNITY): Payer: Medicare Other

## 2019-05-29 DIAGNOSIS — I739 Peripheral vascular disease, unspecified: Secondary | ICD-10-CM | POA: Insufficient documentation

## 2019-05-29 NOTE — Telephone Encounter (Addendum)
Pt contacted pre-catheterization scheduled at [facility] for: Monday June 02, 2019 9:30 AM Verified arrival time and place: [facility]) at: 5:30 AM-pre procedure hydration    No solid food after midnight prior to cath, clear liquids until 5 AM day of procedure. Contrast allergy: no  Hold: Lisinopril-day before and day of procedure-GFR 50 Torsemide-day before and day of procedure-GFR 50  Except hold medications AM meds can be  taken pre-cath with sip of water including: ASA 81 mg Plavix 75 mg  Confirmed patient has responsible adult to drive home post procedure and observe 24 hours after arriving home: yes  Currently, due to Covid-19 pandemic, only one person will be allowed with patient. Must be the same person for patient's entire stay and will be required to wear a mask. They will be asked to wait in the waiting room for the duration of the patient's stay.  Patients are required to wear a mask when they enter the hospital.      COVID-19 Pre-Screening Questions:  . In the past 7 to 10 days have you had a cough,  shortness of breath, headache, congestion, fever (100 or greater) body aches, chills, sore throat, or sudden loss of taste or sense of smell? no . Have you been around anyone with known Covid 19 in the past 7-10 days? no . Have you been around anyone who is awaiting Covid 19 test results in the past 7 to 10 days? No  . Have you been around anyone who has been exposed to Covid 19, or has mentioned symptoms of Covid 19 within the past 7 to 10 days? No   I reviewed procedure/mask/visitor instructions, COVID-19 screening questions with patient.

## 2019-05-29 NOTE — Telephone Encounter (Signed)
Pt states from previous discussion with Dr Gwenlyn Found his understanding is that he will be admitted to the hospital overnight  after the procedure.                   Pt is aware of current [facility] visitor policy that one visitor may be in patient's room from 10 AM-8 PM.                   Pt states that his wife has to stay overnight with him in the hospital.                   Pt requests a call from Dr Gwenlyn Found to discuss allowing his wife to stay in his hospital room overnight.   Pt advised I will forward to Dr Gwenlyn Found.

## 2019-05-30 ENCOUNTER — Other Ambulatory Visit (HOSPITAL_COMMUNITY)
Admission: RE | Admit: 2019-05-30 | Discharge: 2019-05-30 | Disposition: A | Discharge status: Home / Self Care | Payer: Medicare Other | Source: Ambulatory Visit | Attending: Cardiovascular Disease | Admitting: Cardiovascular Disease

## 2019-05-30 DIAGNOSIS — Z01812 Encounter for preprocedural laboratory examination: Secondary | ICD-10-CM | POA: Insufficient documentation

## 2019-05-30 DIAGNOSIS — Z20822 Contact with and (suspected) exposure to covid-19: Secondary | ICD-10-CM | POA: Diagnosis not present

## 2019-05-30 LAB — SARS CORONAVIRUS 2 (TAT 6-24 HRS): SARS Coronavirus 2: NEGATIVE

## 2019-05-30 NOTE — Telephone Encounter (Signed)
Pt aware Dr Gwenlyn Found states he has not control over [facility] visitor policy. Pt states he has worked it out with his wife, if he has to stay overnight at the hospital, his wife will return home before dark, thanked me for call back.

## 2019-05-30 NOTE — Telephone Encounter (Signed)
I have no control over hosp pt visitation

## 2019-06-02 ENCOUNTER — Encounter (HOSPITAL_COMMUNITY)
Admission: RE | Disposition: A | Discharge status: Home / Self Care | Payer: Medicare Other | Source: Home / Self Care | Attending: Cardiovascular Disease

## 2019-06-02 ENCOUNTER — Other Ambulatory Visit (HOSPITAL_COMMUNITY): Payer: Medicare Other

## 2019-06-02 ENCOUNTER — Other Ambulatory Visit: Payer: Self-pay

## 2019-06-02 ENCOUNTER — Observation Stay (HOSPITAL_COMMUNITY)
Admission: RE | Admit: 2019-06-02 | Discharge: 2019-06-03 | Disposition: A | Discharge status: Home / Self Care | Payer: Medicare Other | Attending: Cardiovascular Disease | Admitting: Cardiovascular Disease

## 2019-06-02 DIAGNOSIS — E1151 Type 2 diabetes mellitus with diabetic peripheral angiopathy without gangrene: Secondary | ICD-10-CM | POA: Insufficient documentation

## 2019-06-02 DIAGNOSIS — E785 Hyperlipidemia, unspecified: Secondary | ICD-10-CM | POA: Insufficient documentation

## 2019-06-02 DIAGNOSIS — Z9861 Coronary angioplasty status: Secondary | ICD-10-CM

## 2019-06-02 DIAGNOSIS — E1122 Type 2 diabetes mellitus with diabetic chronic kidney disease: Secondary | ICD-10-CM | POA: Diagnosis not present

## 2019-06-02 DIAGNOSIS — Z794 Long term (current) use of insulin: Secondary | ICD-10-CM | POA: Diagnosis not present

## 2019-06-02 DIAGNOSIS — I70212 Atherosclerosis of native arteries of extremities with intermittent claudication, left leg: Secondary | ICD-10-CM

## 2019-06-02 DIAGNOSIS — I70213 Atherosclerosis of native arteries of extremities with intermittent claudication, bilateral legs: Principal | ICD-10-CM | POA: Insufficient documentation

## 2019-06-02 DIAGNOSIS — Z7982 Long term (current) use of aspirin: Secondary | ICD-10-CM | POA: Diagnosis not present

## 2019-06-02 DIAGNOSIS — I129 Hypertensive chronic kidney disease with stage 1 through stage 4 chronic kidney disease, or unspecified chronic kidney disease: Secondary | ICD-10-CM | POA: Insufficient documentation

## 2019-06-02 DIAGNOSIS — N183 Chronic kidney disease, stage 3 unspecified: Secondary | ICD-10-CM | POA: Diagnosis not present

## 2019-06-02 DIAGNOSIS — I2510 Atherosclerotic heart disease of native coronary artery without angina pectoris: Secondary | ICD-10-CM | POA: Diagnosis not present

## 2019-06-02 DIAGNOSIS — T148XXA Other injury of unspecified body region, initial encounter: Secondary | ICD-10-CM

## 2019-06-02 DIAGNOSIS — Z951 Presence of aortocoronary bypass graft: Secondary | ICD-10-CM | POA: Insufficient documentation

## 2019-06-02 DIAGNOSIS — Z888 Allergy status to other drugs, medicaments and biological substances status: Secondary | ICD-10-CM | POA: Insufficient documentation

## 2019-06-02 DIAGNOSIS — F17210 Nicotine dependence, cigarettes, uncomplicated: Secondary | ICD-10-CM | POA: Insufficient documentation

## 2019-06-02 DIAGNOSIS — Z882 Allergy status to sulfonamides status: Secondary | ICD-10-CM | POA: Diagnosis not present

## 2019-06-02 DIAGNOSIS — Z7902 Long term (current) use of antithrombotics/antiplatelets: Secondary | ICD-10-CM | POA: Diagnosis not present

## 2019-06-02 DIAGNOSIS — I252 Old myocardial infarction: Secondary | ICD-10-CM | POA: Insufficient documentation

## 2019-06-02 DIAGNOSIS — I739 Peripheral vascular disease, unspecified: Secondary | ICD-10-CM | POA: Diagnosis present

## 2019-06-02 DIAGNOSIS — Z79899 Other long term (current) drug therapy: Secondary | ICD-10-CM | POA: Diagnosis not present

## 2019-06-02 HISTORY — PX: ABDOMINAL AORTOGRAM W/LOWER EXTREMITY: CATH118223

## 2019-06-02 LAB — POCT ACTIVATED CLOTTING TIME: Activated Clotting Time: 329 seconds

## 2019-06-02 LAB — GLUCOSE, CAPILLARY
Glucose-Capillary: 151 mg/dL — ABNORMAL HIGH (ref 70–99)
Glucose-Capillary: 140 mg/dL — ABNORMAL HIGH (ref 70–99)
Glucose-Capillary: 152 mg/dL — ABNORMAL HIGH (ref 70–99)
Glucose-Capillary: 153 mg/dL — ABNORMAL HIGH (ref 70–99)
Glucose-Capillary: 187 mg/dL — ABNORMAL HIGH (ref 70–99)

## 2019-06-02 SURGERY — ABDOMINAL AORTOGRAM W/LOWER EXTREMITY
Anesthesia: LOCAL

## 2019-06-02 MED ORDER — HEPARIN (PORCINE) IN NACL 1000-0.9 UT/500ML-% IV SOLN
INTRAVENOUS | Status: DC | PRN
  Administered 2019-06-02: 500 mL

## 2019-06-02 MED ORDER — ALLOPURINOL 100 MG PO TABS
100.0000 mg | ORAL_TABLET | Freq: Three times a day (TID) | ORAL | Status: DC
  Administered 2019-06-02 – 2019-06-03 (×3): 100 mg via ORAL
  Filled 2019-06-02 (×3): qty 1

## 2019-06-02 MED ORDER — SODIUM CHLORIDE 0.9% FLUSH: 3.0000 mL | INTRAVENOUS | Status: DC | PRN

## 2019-06-02 MED ORDER — LIRAGLUTIDE 18 MG/3ML ~~LOC~~ SOPN: 1.2000 mg | PEN_INJECTOR | Freq: Every morning | SUBCUTANEOUS | Status: DC

## 2019-06-02 MED ORDER — HYDRALAZINE HCL 20 MG/ML IJ SOLN
10.0000 mg | Freq: Once | INTRAMUSCULAR | Status: AC
  Administered 2019-06-02: 10 mg via INTRAVENOUS

## 2019-06-02 MED ORDER — LIDOCAINE HCL (PF) 1 % IJ SOLN
INTRAMUSCULAR | Status: AC
  Filled 2019-06-02: qty 30

## 2019-06-02 MED ORDER — CLOPIDOGREL BISULFATE 75 MG PO TABS
75.0000 mg | ORAL_TABLET | Freq: Every day | ORAL | Status: DC
  Administered 2019-06-02: 75 mg via ORAL
  Filled 2019-06-02: qty 1

## 2019-06-02 MED ORDER — CLOPIDOGREL BISULFATE 75 MG PO TABS
75.0000 mg | ORAL_TABLET | Freq: Once | ORAL | Status: AC
  Administered 2019-06-02: 75 mg via ORAL
  Filled 2019-06-02: qty 1

## 2019-06-02 MED ORDER — ASPIRIN EC 81 MG PO TBEC
81.0000 mg | DELAYED_RELEASE_TABLET | Freq: Every day | ORAL | Status: DC
  Administered 2019-06-02 (×2): 81 mg via ORAL
  Filled 2019-06-02 (×2): qty 1

## 2019-06-02 MED ORDER — HYDRALAZINE HCL 20 MG/ML IJ SOLN
INTRAMUSCULAR | Status: AC
  Filled 2019-06-02: qty 1

## 2019-06-02 MED ORDER — TORSEMIDE 20 MG PO TABS
20.0000 mg | ORAL_TABLET | Freq: Two times a day (BID) | ORAL | Status: DC
  Administered 2019-06-02 – 2019-06-03 (×3): 20 mg via ORAL
  Filled 2019-06-02 (×3): qty 1

## 2019-06-02 MED ORDER — HEPARIN SODIUM (PORCINE) 1000 UNIT/ML IJ SOLN
INTRAMUSCULAR | Status: AC
  Filled 2019-06-02: qty 1

## 2019-06-02 MED ORDER — HYDRALAZINE HCL 20 MG/ML IJ SOLN
INTRAMUSCULAR | Status: DC | PRN
  Administered 2019-06-02 (×2): 10 mg via INTRAVENOUS

## 2019-06-02 MED ORDER — HEPARIN SODIUM (PORCINE) 1000 UNIT/ML IJ SOLN
INTRAMUSCULAR | Status: DC | PRN
  Administered 2019-06-02: 8500 [IU] via INTRAVENOUS

## 2019-06-02 MED ORDER — PRAVASTATIN SODIUM 40 MG PO TABS
80.0000 mg | ORAL_TABLET | Freq: Every evening | ORAL | Status: DC
  Administered 2019-06-02: 80 mg via ORAL
  Filled 2019-06-02: qty 2

## 2019-06-02 MED ORDER — ALBUTEROL SULFATE (2.5 MG/3ML) 0.083% IN NEBU: 2.5000 mg | INHALATION_SOLUTION | Freq: Four times a day (QID) | RESPIRATORY_TRACT | Status: DC | PRN

## 2019-06-02 MED ORDER — HEPARIN (PORCINE) IN NACL 1000-0.9 UT/500ML-% IV SOLN
INTRAVENOUS | Status: AC
  Filled 2019-06-02: qty 1000

## 2019-06-02 MED ORDER — LISINOPRIL 5 MG PO TABS
5.0000 mg | ORAL_TABLET | Freq: Every day | ORAL | Status: DC
  Administered 2019-06-02: 5 mg via ORAL
  Filled 2019-06-02: qty 1

## 2019-06-02 MED ORDER — SODIUM CHLORIDE 0.9 % IV SOLN: 250.0000 mL | INTRAVENOUS | Status: DC | PRN

## 2019-06-02 MED ORDER — EZETIMIBE 10 MG PO TABS
10.0000 mg | ORAL_TABLET | Freq: Every day | ORAL | Status: DC
  Administered 2019-06-02: 10 mg via ORAL
  Filled 2019-06-02: qty 1

## 2019-06-02 MED ORDER — ALPRAZOLAM 0.25 MG PO TABS: 0.2500 mg | ORAL_TABLET | Freq: Two times a day (BID) | ORAL | Status: DC | PRN

## 2019-06-02 MED ORDER — CARVEDILOL 25 MG PO TABS
25.0000 mg | ORAL_TABLET | Freq: Two times a day (BID) | ORAL | Status: DC
  Administered 2019-06-02 – 2019-06-03 (×3): 25 mg via ORAL
  Filled 2019-06-02 (×3): qty 1

## 2019-06-02 MED ORDER — ISOSORBIDE MONONITRATE ER 60 MG PO TB24
60.0000 mg | ORAL_TABLET | Freq: Every day | ORAL | Status: DC
  Administered 2019-06-02: 60 mg via ORAL
  Filled 2019-06-02: qty 1

## 2019-06-02 MED ORDER — LIDOCAINE HCL (PF) 1 % IJ SOLN
INTRAMUSCULAR | Status: DC | PRN
  Administered 2019-06-02: 20 mL

## 2019-06-02 MED ORDER — SODIUM CHLORIDE 0.9 % WEIGHT BASED INFUSION
3.0000 mL/kg/h | INTRAVENOUS | Status: DC
  Administered 2019-06-02: 3 mL/kg/h via INTRAVENOUS

## 2019-06-02 MED ORDER — SODIUM CHLORIDE 0.9 % WEIGHT BASED INFUSION
1.0000 mL/kg/h | INTRAVENOUS | Status: DC
  Administered 2019-06-02: 1 mL/kg/h via INTRAVENOUS

## 2019-06-02 MED ORDER — ASPIRIN 81 MG PO CHEW
CHEWABLE_TABLET | ORAL | Status: AC
  Filled 2019-06-02: qty 1

## 2019-06-02 MED ORDER — ASPIRIN 81 MG PO CHEW
81.0000 mg | CHEWABLE_TABLET | ORAL | Status: AC
  Administered 2019-06-02: 81 mg via ORAL

## 2019-06-02 MED ORDER — IODIXANOL 320 MG/ML IV SOLN
INTRAVENOUS | Status: DC | PRN
  Administered 2019-06-02: 85 mL via INTRA_ARTERIAL

## 2019-06-02 SURGICAL SUPPLY — 23 items
BALLN MUSTANG 8.0X40 75 (BALLOONS) ×2
BALLOON MUSTANG 8.0X40 75 (BALLOONS) ×1 IMPLANT
CATH ANGIO 5F PIGTAIL 65CM (CATHETERS) ×2 IMPLANT
CATH CROSS OVER TEMPO 5F (CATHETERS) ×2 IMPLANT
CLOSURE MYNX CONTROL 6F/7F (Vascular Products) ×2 IMPLANT
GUIDEWIRE ANGLED .035X150CM (WIRE) ×2 IMPLANT
KIT ENCORE 26 ADVANTAGE (KITS) ×2 IMPLANT
KIT PV (KITS) ×2 IMPLANT
SHEATH PINNACLE 5F 10CM (SHEATH) ×2 IMPLANT
SHEATH PINNACLE 7F 10CM (SHEATH) ×2 IMPLANT
SHEATH PINNACLE MP 7F 45CM (SHEATH) ×2 IMPLANT
SHEATH PROBE COVER 6X72 (BAG) ×2 IMPLANT
STENT ABSOLUTE PRO 9.0X80X135 (Permanent Stent) ×2 IMPLANT
STOPCOCK MORSE 400PSI 3WAY (MISCELLANEOUS) ×2 IMPLANT
SYR MEDRAD MARK 7 150ML (SYRINGE) ×2 IMPLANT
TAPE VIPERTRACK RADIOPAQ (MISCELLANEOUS) ×1 IMPLANT
TAPE VIPERTRACK RADIOPAQUE (MISCELLANEOUS) ×2
TRANSDUCER W/STOPCOCK (MISCELLANEOUS) ×2 IMPLANT
TRAY PV CATH (CUSTOM PROCEDURE TRAY) ×2 IMPLANT
TUBING CIL FLEX 10 FLL-RA (TUBING) ×2 IMPLANT
WIRE HITORQ VERSACORE ST 145CM (WIRE) ×2 IMPLANT
WIRE ROSEN-J .035X180CM (WIRE) ×2 IMPLANT
WIRE VERSACORE LOC 115CM (WIRE) ×2 IMPLANT

## 2019-06-02 NOTE — Progress Notes (Addendum)
Attempted to call report to 6e states they don't have any clean rooms at this time, Will call back later.Ria Comment, NP called related to bp and heart rate. Waiting on return call

## 2019-06-02 NOTE — Progress Notes (Signed)
Hematoma noted to right groin pressure held. Ria Comment, [place] called and asked to check groin.

## 2019-06-02 NOTE — Plan of Care (Signed)
  Problem: Education: Goal: Knowledge of General Education information will improve Description: Including pain rating scale, medication(s)/side effects and non-pharmacologic comfort measures Outcome: Progressing   Problem: Clinical Measurements: Goal: Ability to maintain clinical measurements within normal limits will improve Outcome: Progressing   Problem: Coping: Goal: Level of anxiety will decrease Outcome: Progressing   Problem: Pain Managment: Goal: General experience of comfort will improve Outcome: Progressing   Problem: Safety: Goal: Ability to remain free from injury will improve Outcome: Progressing  Elesa Hacker, RN

## 2019-06-02 NOTE — Progress Notes (Signed)
Report called to Ray on 6E

## 2019-06-02 NOTE — Progress Notes (Addendum)
Ambulated to bathroom to void. Tol well no bleeding or swelling noted to right groin area before or after ambulation. Pt states he called his family and told them he has to spend the night.

## 2019-06-02 NOTE — Progress Notes (Signed)
    Called to bedside as patient developed a small hematoma in the right groin. RN held pressure with improvement but small hematoma noted still. No pain with palpation. Mynx device closure used during the case. Remains on bed rest. Updated Dr. Gwenlyn Found and will observe overnight.   SignedReino Bellis, NP-C 06/02/2019, 1:27 PM Pager: [phone]

## 2019-06-02 NOTE — Progress Notes (Addendum)
Attempted to call [name] times 2 no answer, unable to leave message. Discharge instructions reviewed with pt. Voices understanding.

## 2019-06-02 NOTE — Interval H&P Note (Signed)
History and Physical Interval Note:  06/02/2019 9:03 AM  Tommy Douglas  has presented today for surgery, with the diagnosis of pad.  The various methods of treatment have been discussed with the patient and family. After consideration of risks, benefits and other options for treatment, the patient has consented to  Procedure(s): ABDOMINAL AORTOGRAM W/LOWER EXTREMITY (N/A) as a surgical intervention.  The patient's history has been reviewed, patient examined, no change in status, stable for surgery.  I have reviewed the patient's chart and labs.  Questions were answered to the patient's satisfaction.     Quay Burow

## 2019-06-02 NOTE — Progress Notes (Addendum)
Ria Comment, [place] in to assess pt.also informed of bp and heart rate

## 2019-06-03 DIAGNOSIS — I739 Peripheral vascular disease, unspecified: Secondary | ICD-10-CM | POA: Diagnosis not present

## 2019-06-03 DIAGNOSIS — T148XXA Other injury of unspecified body region, initial encounter: Secondary | ICD-10-CM

## 2019-06-03 DIAGNOSIS — I70213 Atherosclerosis of native arteries of extremities with intermittent claudication, bilateral legs: Secondary | ICD-10-CM | POA: Diagnosis not present

## 2019-06-03 LAB — GLUCOSE, CAPILLARY: Glucose-Capillary: 155 mg/dL — ABNORMAL HIGH (ref 70–99)

## 2019-06-03 NOTE — Discharge Summary (Addendum)
Discharge Summary    Patient ID: Tommy Douglas,  MRN: [id number], DOB/AGE: Apr 30, 1950 69 y.o.  Admit date: 06/02/2019 Discharge date: 06/03/2019  Primary Care Provider: Terald Sleeper Primary Cardiologist: Kate Sable, MD  Discharge Diagnoses    Active Problems:   CAD S/P percutaneous coronary angioplasty   PVD (peripheral vascular disease) ([place])   PAD (peripheral artery disease) ([facility])  Allergies Allergies  Allergen Reactions  . Atorvastatin     All statins make him cough  . Statins Cough    Pt is currently taking pravastatin   . Sulfa Antibiotics     Diagnostic Studies/Procedures    PV angiogram: 06/02/19  Final Impression: Successful proximal mid left external iliac artery stenosis with [place] limiting claudication improved with one long nitinol self-expanding stent (9 mm x 80 mm Abbott absolute Pro).  Patient was already on dual antiplatelet therapy.  Mynx closure was successful.  Patient can be discharged home today as an outpatient, get lower extremity arterial Doppler studies in our [place] line office next week I will see him back the week after for follow-up.  Tommy Douglas. MD, [facility] 06/02/2019 10:08 AM _____________   History of Present Illness     69 y.o. male who was referred to Dr. Gwenlyn Found by Dr.Koneswarenfor peripheral vascular evaluation because of lifestyle limiting medication. His cardiac risk factors were notable for ongoing tobacco abuse one pack per day for the last 45 years, treated hypertension, diabetes and hyperlipidemia. He had a myocardial infarction 15 years ago but had never had a stroke. He recently had circumflex intervention by Dr. Angelena Form. He currently denied chest pain or shortness of breath at his most recent office visit. He did complain of left lower extremity claudication which was lifestyle limiting a recent Doppler in our office performed 11/30/16 revealed high-frequency signal in his left external iliac artery with a left ABI  0.89.  We were planning on performing angiography and intervention but unfortunately his girlfriend had a stroke and he needed to take care of her and therefore put off his intervention. He presented back to the office with Dr. Gwenlyn Found and outpatient procedure was scheduled.   Hospital Course     Underwent PV angiogram noted above with successful proximal mid left external iliac artery stenosis with Leksell limiting claudication improved with one long nitinol self-expanding stent (9 mm x 80 mm Abbott absolute Pro). He will be continued on DAPT with ASA/plavix. While in short stay developed a small hematoma to the right groin which improved with pressure. Mynx device was used during the case. He was observed overnight given hematoma which was stable the following morning. Able to ambulate without difficultly.   General: Well developed, well nourished, male appearing in no acute distress. Head: Normocephalic, atraumatic.  Neck: Supple without bruits,  Lungs:  Resp regular and unlabored, CTA. Heart: RRR, S1, S2, no murmur; no rub. Abdomen: Soft, non-tender, non-distended with normoactive bowel sounds. No hepatomegaly. No rebound/guarding. No obvious abdominal masses. Extremities: No clubbing, cyanosis, edema. Distal pedal pulses are 2+ bilaterally. Right femoral cath site stable without bruising or hematoma Neuro: Alert and oriented X 3. Moves all extremities spontaneously. Psych: Normal affect.  Tommy Douglas was seen by Dr. Claiborne Billings and determined stable for discharge home. Follow up in the office has been arranged. Medications are listed below.   _____________  Discharge Vitals Blood pressure 136/71, pulse 96, temperature 98.2 F (36.8 C), temperature source Oral, resp. rate 20, height 5\' 6"  (1.676 m), weight 76.2  kg, SpO2 99 %.  Filed Weights   06/02/19 0545 06/03/19 0431  Weight: 83 kg 76.2 kg    Labs & Radiologic Studies    CBC No results for input(s): WBC, NEUTROABS, HGB, HCT,  MCV, PLT in the last 72 hours. Basic Metabolic Panel No results for input(s): NA, K, CL, CO2, GLUCOSE, BUN, CREATININE, CALCIUM, MG, PHOS in the last 72 hours. Liver Function Tests No results for input(s): AST, ALT, ALKPHOS, BILITOT, PROT, ALBUMIN in the last 72 hours. No results for input(s): LIPASE, AMYLASE in the last 72 hours. Cardiac Enzymes No results for input(s): CKTOTAL, CKMB, CKMBINDEX, TROPONINI in the last 72 hours. BNP Invalid input(s): POCBNP D-Dimer No results for input(s): DDIMER in the last 72 hours. Hemoglobin A1C No results for input(s): HGBA1C in the last 72 hours. Fasting Lipid Panel No results for input(s): CHOL, HDL, LDLCALC, TRIG, CHOLHDL, LDLDIRECT in the last 72 hours. Thyroid Function Tests No results for input(s): TSH, T4TOTAL, T3FREE, THYROIDAB in the last 72 hours.  Invalid input(s): FREET3 _____________  PERIPHERAL VASCULAR CATHETERIZATION  Result Date: 06/02/2019  [id number] LOCATION:  FACILITY: [facility] PHYSICIAN: Tommy Douglas, M.D. Jul 12, 1950 DATE OF PROCEDURE:  06/02/2019 DATE OF DISCHARGE: PV Angiogram/Intervention History obtained from the patient's chart:.Tommy Douglas is a 69 y.o.  divorced African-American male father of 39, grandfather of 9 grandchildren is accompanied by his girlfriend [name]. He was referred by Dr.Koneswarenfor peripheral vascular evaluation because of lifestyle limiting medication.  I last saw him in the office 12/27/2016.  His cardiac risk factors are notable for ongoing tobacco abuse one pack per day for the last 45 years, treated hypertension, diabetes and hyperlipidemia. He had a myocardial infarction 15 years ago but has never had a stroke. He recently had circumflex intervention by Dr. Nile Dear days ago. He currently denies chest pain or shortness of breath. He does complain of left lower extremity claudication which is lifestyle limiting a recent Doppler in our office performed 11/30/16 revealed high-frequency signal in  his left external iliac artery with a left ABI 0.89.  We were planning on performing angiography and intervention but unfortunately his girlfriend had a stroke and he needed to take care of her and therefore put off his intervention.  Now he is back with worsening claudication and wishes to proceed with angiography and intervention. Pre Procedure Diagnosis: Claudication Post Procedure Diagnosis: Claudication Operators: Dr. Quay Douglas Procedures Performed:  1.  Ultrasound-guided right common femoral access  2.  Abdominal aortogram/bilateral iliac angiogram  3.  Contralateral access.  (Second order catheter placement)  4.  PTA and self-expanding stent proximal and mid left external iliac artery  5.  Right common femoral angiogram, Mynx closure PROCEDURE DESCRIPTION: The patient was brought to the second floor [facility] cath lab in the the postabsorptive state. He was premedicated with IV Versed and fentanyl. His right groin was prepped and shaved in usual sterile fashion. Xylocaine 1% was used for local anesthesia. A 5 French sheath was inserted into the right common femoral artery using standard Seldinger technique.  Ultrasound guidance was used to identify the vessel and facilitate access.  A digital image was captured and placed the patient's chart.  A 5 French pigtail catheter was placed in the distal abdominal aorta.  Abdominal aortography, bilateral iliac angiography were performed.  Angiographic Data: 1: Abdominal aortogram-no significant atherosclerotic disease noted 2: Left lower extremity-60% ulcerated distal left common iliac/proximal left extra iliac artery stenosis, 75% fairly focal mid left external iliac artery stenosis 3: Right  lower extremity-75% proximal/ostial right SFA stenosis   Tommy Douglas has tandem lesions in his left external iliac artery contributing to his claudication.  We will proceed with PTA and stenting using 1 nitinol self-expanding stent. Procedure Description:  Contralateral access was obtained with a crossover catheter, versa core wire upgraded to a 035 Rosen wire.  A 7 French 45 cm long destination sheath was then advanced over the [place] wire into the proximal left common iliac artery.  Patient received a total of 8500 units of heparin with an ACT of 329.  Total contrast administered the patient was 85 cc.  He was already on aspirin Plavix. I primarily stented both tandem lesions with a 9 mm x 80 mm long Abbott nitinol absolute Pro self-expanding stent I dilated the entire stented segment with a 8 mm x 4 cm short shaft balloon resulting reduction of proximal 60, distal 75% left external carotid stenosis to 0% residual.  Patient tolerated procedure well.  I then withdrew the destination sheath over the bifurcation and exchanged over an 035 versa core wire for a short 7 [place] sheath.  A right common femoral angiogram was performed and a Mynx closure device was successfully deployed.  Hemostasis was achieved.  The patient was hypertensive in the lab and received 10 mg of IV hydralazine twice with improvement in blood pressure control. Final Impression: Successful proximal mid left external iliac artery stenosis with Leksell limiting claudication improved with one long nitinol self-expanding stent (9 mm x 80 mm Abbott absolute Pro).  Patient was already on dual antiplatelet therapy.  Mynx closure was successful.  Patient can be discharged home today as an outpatient, get lower extremity arterial Doppler studies in our [place] line office next week I will see him back the week after for follow-up. Tommy Douglas. MD, [facility] 06/02/2019 10:08 AM   VAS [place] ABI WITH/WO TBI  Result Date: 05/30/2019 LOWER EXTREMITY DOPPLER STUDY Indications: Claudication, peripheral artery disease, and Patient complains of              left groin and upper thigh pain after walking about 2-3 minutes. He              also indicates it hurts all the time. He has been experiencing this              pain  for about 2 1/2 years and has put it off taking care of his              girlfriend. and left transmetatarsal amputation. High Risk Factors: Hypertension, hyperlipidemia, Diabetes, current smoker,                    coronary artery disease. Other Factors: Stage 3 CKD                 SEE BILATERAL LEG ARTERIAL DUPLEX REPORT.  Vascular Interventions: None. Comparison Study: Prior ABI on 11/30/2016 right .82 and left .87 Performing Technologist: Salvadore Dom RVT, RDCS (AE), RDMS  Examination Guidelines: A complete evaluation includes at minimum, Doppler waveform signals and systolic blood pressure reading at the level of bilateral brachial, anterior tibial, and posterior tibial arteries, when vessel segments are accessible. Bilateral testing is considered an integral part of a complete examination. Photoelectric Plethysmograph (PPG) waveforms and toe systolic pressure readings are included as required and additional duplex testing as needed. Limited examinations for reoccurring indications may be performed as noted.  ABI Findings: +---------+------------------+-----+----------+--------+ Right    Rt Pressure (mmHg)IndexWaveform  Comment  +---------+------------------+-----+----------+--------+ Brachial 160                                       +---------+------------------+-----+----------+--------+ ATA      136               0.85 monophasic         +---------+------------------+-----+----------+--------+ PTA      162               1.01 monophasic         +---------+------------------+-----+----------+--------+ PERO     124               0.78 monophasic         +---------+------------------+-----+----------+--------+ Great Toe116               0.72 Normal             +---------+------------------+-----+----------+--------+ +---------+------------------+-----+-----------+-------+ Left     Lt Pressure (mmHg)IndexWaveform   Comment  +---------+------------------+-----+-----------+-------+ Brachial 150                                       +---------+------------------+-----+-----------+-------+ ATA      250               1.56 triphasic          +---------+------------------+-----+-----------+-------+ PTA      182               1.14 multiphasic        +---------+------------------+-----+-----------+-------+ PERO     150               0.94 monophasic         +---------+------------------+-----+-----------+-------+ Ella Jubilee               1.39 Normal             +---------+------------------+-----+-----------+-------+ +-------+-----------+-----------+------------+------------+ ABI/TBIToday's ABIToday's TBIPrevious ABIPrevious TBI +-------+-----------+-----------+------------+------------+ Right  1.01       .72        .82         .65          +-------+-----------+-----------+------------+------------+ Left   1.56       1.39       .89         .66          +-------+-----------+-----------+------------+------------+ Bilateral ABIs and TBIs appear increased.  Summary: Right: Resting right ankle-brachial index is within normal range. No evidence of significant right lower extremity arterial disease. The right toe-brachial index is normal. Left: Resting left ankle-brachial index indicates noncompressible left lower extremity arteries. The left toe-brachial index is normal.  *See table(s) above for measurements and observations.  Electronically signed by Ida Rogue MD on 05/30/2019 at 8:38:59 PM.    Final    VAS [place] LOWER EXTREMITY ARTERIAL DUPLEX  Result Date: 05/30/2019 LOWER EXTREMITY ARTERIAL DUPLEX STUDY Indications: Peripheral artery disease, and Patient complains of left groin and              upper thigh pain after walking about 2-3 minutes. He also indicates              it hurts all the time. He has been experiencing this pain for about              2 1/2 years  and has put it off taking care  of his girlfriend. High Risk Factors: Hypertension, hyperlipidemia, Diabetes, current smoker,                    coronary artery disease. Other Factors: SEE ABI REPORT                 CKD stage 3.  Vascular Interventions: None. Current ABI:            right 1.01 left 1.56 (N/C) Comparison Study: Prior arterial duplex exam done on 11/30/2016 showed velocities                   in the right EIA 237 cm/s, distal SFA 216 cm/s and left EIA                   475 cm/s,distal SFA 212 cm/s. Performing Technologist: Salvadore Dom RVT, RDCS (AE), RDMS  Examination Guidelines: A complete evaluation includes B-mode imaging, spectral Doppler, color Doppler, and power Doppler as needed of all accessible portions of each vessel. Bilateral testing is considered an integral part of a complete examination. Limited examinations for reoccurring indications may be performed as noted. Aorta: +------+-------+----------+----------+--------+--------+-----+       AP (cm)Trans (cm)PSV (cm/s)WaveformThrombusShape +------+-------+----------+----------+--------+--------+-----+ Distal1.70             72        biphasic              +------+-------+----------+----------+--------+--------+-----+   +-----------+--------+-----+--------+----------+--------+ RIGHT      PSV cm/sRatioStenosisWaveform  Comments +-----------+--------+-----+--------+----------+--------+ CIA Prox   88                   biphasic           +-----------+--------+-----+--------+----------+--------+ CIA Mid    73                   biphasic           +-----------+--------+-----+--------+----------+--------+ CIA Distal 73                   biphasic           +-----------+--------+-----+--------+----------+--------+ EIA Prox   91                   biphasic           +-----------+--------+-----+--------+----------+--------+ EIA Mid    145                  biphasic            +-----------+--------+-----+--------+----------+--------+ EIA Distal 129                  biphasic           +-----------+--------+-----+--------+----------+--------+ CFA Prox   84                   biphasic           +-----------+--------+-----+--------+----------+--------+ CFA Distal 71                   triphasic          +-----------+--------+-----+--------+----------+--------+ DFA        129                  biphasic           +-----------+--------+-----+--------+----------+--------+ SFA Prox   155  biphasic           +-----------+--------+-----+--------+----------+--------+ SFA Mid    146                  biphasic           +-----------+--------+-----+--------+----------+--------+ SFA Distal 162                  biphasic           +-----------+--------+-----+--------+----------+--------+ POP Prox   115                  biphasic           +-----------+--------+-----+--------+----------+--------+ POP Distal 62                   biphasic           +-----------+--------+-----+--------+----------+--------+ TP Trunk   65                   biphasic           +-----------+--------+-----+--------+----------+--------+ ATA Prox   38                   biphasic           +-----------+--------+-----+--------+----------+--------+ ATA Distal 58                   monophasic         +-----------+--------+-----+--------+----------+--------+ PTA Prox                occluded                   +-----------+--------+-----+--------+----------+--------+ PTA Mid                 occluded                   +-----------+--------+-----+--------+----------+--------+ PTA Distal              occluded                   +-----------+--------+-----+--------+----------+--------+ PERO Prox  54                   biphasic           +-----------+--------+-----+--------+----------+--------+ PERO Mid   46                    biphasic           +-----------+--------+-----+--------+----------+--------+ PERO Distal             occluded                   +-----------+--------+-----+--------+----------+--------+ Unable to duplicate velocities from prior exam in 11/2016.  +-----------+--------+-----+--------------+-------------------+----------------+ LEFT       PSV cm/sRatioStenosis      Waveform           Comments         +-----------+--------+-----+--------------+-------------------+----------------+ CIA Prox   62                         biphasic                            +-----------+--------+-----+--------------+-------------------+----------------+ CIA Mid    95                         biphasic                            +-----------+--------+-----+--------------+-------------------+----------------+  CIA Distal 184                        biphasic                            +-----------+--------+-----+--------------+-------------------+----------------+ EIA Prox   184                        biphasic                            +-----------+--------+-----+--------------+-------------------+----------------+ EIA Mid    320                        biphasic           >50 % stenosis   +-----------+--------+-----+--------------+-------------------+----------------+ EIA Distal 157                        biphasic                            +-----------+--------+-----+--------------+-------------------+----------------+ CFA Prox   119                        biphasic                            +-----------+--------+-----+--------------+-------------------+----------------+ CFA Distal 63                         biphasic                            +-----------+--------+-----+--------------+-------------------+----------------+ DFA        78                         biphasic                             +-----------+--------+-----+--------------+-------------------+----------------+ SFA Prox   95                         biphasic                            +-----------+--------+-----+--------------+-------------------+----------------+ SFA Mid    87                         biphasic                            +-----------+--------+-----+--------------+-------------------+----------------+ SFA Distal 185          30-49%                           high end range                           stenosis                                          +-----------+--------+-----+--------------+-------------------+----------------+  POP Prox   77                         biphasic                            +-----------+--------+-----+--------------+-------------------+----------------+ POP Distal 53                         biphasic                            +-----------+--------+-----+--------------+-------------------+----------------+ TP Trunk   97                         biphasic                            +-----------+--------+-----+--------------+-------------------+----------------+ ATA Prox   234          50-74%        biphasic                                                    stenosis                                          +-----------+--------+-----+--------------+-------------------+----------------+ ATA Mid    73                         biphasic                            +-----------+--------+-----+--------------+-------------------+----------------+ ATA Distal 54                         biphasic                            +-----------+--------+-----+--------------+-------------------+----------------+ PTA Prox   14                         dampened monophasic? near occlusion +-----------+--------+-----+--------------+-------------------+----------------+ PTA Mid                 occluded                                           +-----------+--------+-----+--------------+-------------------+----------------+ PTA Distal              occluded                                          +-----------+--------+-----+--------------+-------------------+----------------+ PERO Prox  24                         biphasic                            +-----------+--------+-----+--------------+-------------------+----------------+  PERO Mid   50                         biphasic                            +-----------+--------+-----+--------------+-------------------+----------------+ PERO Distal             occluded                                          +-----------+--------+-----+--------------+-------------------+----------------+ A focal velocity elevation of 185 cm/s was obtained at distal SFA with a VR of 2.1. Findings are characteristic of 30-49% stenosis. A 2nd focal velocity elevation was visualized, measuring 234 cm/s at proximal ATA with a VR of 2.41. Findings are characteristic of 50-74% stenosis.  Summary: Right: Total occlusion noted in the posterior tibial artery. Moderate wall calcification and plaque seen throughout distal aorta,iliac and extremity. Partial occlusion distal peroneal artery. Left: 30-49% stenosis noted in the superficial femoral artery. 50-74% stenosis noted in the anterior tibial artery. Total occlusion noted in the posterior tibial artery. >50 % stenosis mid EIA. Moderate wall calcification and plaque seen throughout distal aorta, iliaca and extremity. Partial distal peroneal arterial occlusion.  See table(s) above for measurements and observations. Vascular consult recommended. Electronically signed by Ida Rogue MD on 05/30/2019 at 8:57:53 PM.    Final    Disposition   Pt is being discharged home today in good condition.  Follow-up Plans & Appointments    Follow-up Information    [facility] Follow up on 06/09/2019.   Specialty: Cardiology Why: at 1pm for your follow  up dopplers. Contact information: [address] [place] [phone]       Lorretta Harp, MD Follow up on 06/17/2019.   Specialties: Cardiology, Radiology Why: at 11am for your follow up appt.  Contact information: [address] [place] [address] [phone]             Discharge Medications     Medication List    TAKE these medications   allopurinol 100 MG tablet Commonly known as: ZYLOPRIM Take 1 tablet (100 mg total) by mouth 3 (three) times daily.   ALPRAZolam 0.25 MG tablet Commonly known as: XANAX TAKE 1 TABLET TWICE DAILY AS NEEDED FOR ANXIETY What changed: See the new instructions.   aspirin EC 81 MG tablet Take 81 mg by mouth at bedtime.   carvedilol 25 MG tablet Commonly known as: COREG TAKE ONE TABLET BY MOUTH TWICE DAILY What changed: when to take this   cetirizine 10 MG tablet Commonly known as: ZYRTEC Take 1 tablet (10 mg total) by mouth daily as needed (for allergies.).   clopidogrel 75 MG tablet Commonly known as: PLAVIX Take 1 tablet (75 mg total) by mouth at bedtime.   cyclobenzaprine 10 MG tablet Commonly known as: FLEXERIL Take 1 tablet (10 mg total) by mouth 3 (three) times daily as needed. for muscle spams What changed: when to take this   ergocalciferol 1.25 MG (50000 UT) capsule Commonly known as: VITAMIN D2 Take 1 capsule (50,000 Units total) by mouth once a week.   ezetimibe 10 MG tablet Commonly known as: ZETIA TAKE ONE (1) TABLET EACH DAY What changed: See the new instructions.   HYDROcodone-acetaminophen 10-325 MG tablet Commonly  known as: NORCO Take 1 tablet by mouth every 6 (six) hours as needed (pain.).   isosorbide mononitrate 60 MG 24 hr tablet Commonly known as: IMDUR TAKE ONE (1) TABLET EACH DAY What changed: See the new instructions.   lisinopril 5 MG tablet Commonly known as: ZESTRIL Take 5 mg by mouth at bedtime.   multivitamin with  minerals Tabs tablet Take 1 tablet by mouth at bedtime. Centrum Silver   nitroGLYCERIN 0.4 MG SL tablet Commonly known as: NITROSTAT Place 1 tablet (0.4 mg total) under the tongue every 5 (five) minutes as needed for chest pain.   pantoprazole 40 MG tablet Commonly known as: PROTONIX Take 1 tablet (40 mg total) by mouth daily at 6 (six) AM. What changed: when to take this   pravastatin 80 MG tablet Commonly known as: PRAVACHOL TAKE 1 TABLET DAILY IN THE EVENING What changed: when to take this   pregabalin 75 MG capsule Commonly known as: LYRICA Take 1 capsule (75 mg total) by mouth 3 (three) times daily. New lower RENAL DOSING   torsemide 20 MG tablet Commonly known as: DEMADEX Take 1 tablet (20 mg total) by mouth 2 (two) times daily. What changed: additional instructions   UltiCare Micro Pen Needles 32G X 4 MM Misc Generic drug: Insulin Pen Needle EVERY DAY   Ventolin HFA 108 (90 Base) MCG/ACT inhaler Generic drug: albuterol USE 2 PUFFS EVERY 6 HOURS AS NEEDED What changed: See the new instructions.   Victoza 18 MG/3ML Sopn Generic drug: liraglutide INJECT 0.3ML (1.8MG ) SQ DAILY What changed: See the new instructions.        No                               Did the patient have a percutaneous coronary intervention (stent / angioplasty)?:  No.      Outstanding Labs/Studies   Follow up dopplers   Duration of Discharge Encounter   Greater than 30 minutes including physician time.  Signed, Reino Bellis NP-C 06/03/2019, 8:03 AM   Patient seen and examined. Agree with assessment and plan.   No chest pain or shortness of breath.  Ecchymosis right groin with soft hematoma.  Good distal pulses.  Heart rate today increased at 103.  Patient is on carvedilol 25 mg twice a day which had not yet been given.  Okay for DC see today.  Follow-up with Dr. Gwenlyn Found.   Troy Sine, MD, [facility] 06/03/2019 9:29 AM

## 2019-06-09 ENCOUNTER — Ambulatory Visit (HOSPITAL_BASED_OUTPATIENT_CLINIC_OR_DEPARTMENT_OTHER)
Admission: RE | Admit: 2019-06-09 | Discharge: 2019-06-09 | Disposition: A | Discharge status: Home / Self Care | Payer: Medicare Other | Source: Ambulatory Visit | Attending: Cardiovascular Disease | Admitting: Cardiovascular Disease

## 2019-06-09 ENCOUNTER — Other Ambulatory Visit: Payer: Self-pay

## 2019-06-09 ENCOUNTER — Other Ambulatory Visit (HOSPITAL_COMMUNITY): Payer: Self-pay | Admitting: Cardiovascular Disease

## 2019-06-09 ENCOUNTER — Ambulatory Visit (HOSPITAL_COMMUNITY)
Admission: RE | Admit: 2019-06-09 | Discharge: 2019-06-09 | Disposition: A | Discharge status: Home / Self Care | Payer: Medicare Other | Source: Ambulatory Visit | Attending: Cardiovascular Disease | Admitting: Cardiovascular Disease

## 2019-06-09 DIAGNOSIS — I739 Peripheral vascular disease, unspecified: Secondary | ICD-10-CM | POA: Insufficient documentation

## 2019-06-09 DIAGNOSIS — Z95828 Presence of other vascular implants and grafts: Secondary | ICD-10-CM

## 2019-06-10 ENCOUNTER — Telehealth: Payer: Self-pay

## 2019-06-10 DIAGNOSIS — I739 Peripheral vascular disease, unspecified: Secondary | ICD-10-CM

## 2019-06-10 NOTE — Telephone Encounter (Signed)
Spoke to patient recent doppler results given.Advised to repeat in 6 months.

## 2019-06-11 ENCOUNTER — Encounter: Payer: Self-pay | Admitting: Physician Assistant

## 2019-06-11 ENCOUNTER — Ambulatory Visit (INDEPENDENT_AMBULATORY_CARE_PROVIDER_SITE_OTHER): Payer: Medicare Other | Admitting: Physician Assistant

## 2019-06-11 ENCOUNTER — Other Ambulatory Visit: Payer: Self-pay

## 2019-06-11 VITALS — BP 159/85 | HR 105 | Temp 98.6°F | Ht 66.0 in | Wt 182.4 lb

## 2019-06-11 DIAGNOSIS — F411 Generalized anxiety disorder: Secondary | ICD-10-CM | POA: Diagnosis not present

## 2019-06-11 DIAGNOSIS — N182 Chronic kidney disease, stage 2 (mild): Secondary | ICD-10-CM | POA: Diagnosis not present

## 2019-06-11 DIAGNOSIS — E1122 Type 2 diabetes mellitus with diabetic chronic kidney disease: Secondary | ICD-10-CM

## 2019-06-11 LAB — BAYER DCA HB A1C WAIVED: HB A1C (BAYER DCA - WAIVED): 6.4 % (ref <7.0)

## 2019-06-11 MED ORDER — BUSPIRONE HCL 10 MG PO TABS: 10.0000 mg | ORAL_TABLET | Freq: Three times a day (TID) | ORAL | 2 refills | Status: DC

## 2019-06-11 MED ORDER — ALPRAZOLAM 0.25 MG PO TABS: ORAL_TABLET | ORAL | 0 refills | Status: DC

## 2019-06-11 NOTE — Progress Notes (Signed)
2/18 xanax .25 BID     BP (!) 159/85   Pulse (!) 105   Temp 98.6 F (37 C)   Ht 5\' 6"  (1.676 m)   Wt 182 lb 6.4 oz (82.7 kg)   SpO2 93%   BMI 29.44 kg/m    Subjective:    Patient ID: Tommy Douglas, male    DOB: 06-07-1950, 69 y.o.   MRN: [id number]  HPI 1. Type 2 diabetes mellitus with stage 2 chronic kidney disease, without long-term current use of insulin ([place])  2. GAD (generalized anxiety disorder)   HPI: Tommy Douglas is a 69 y.o. male presenting on 06/11/2019 for Medical Management of Chronic Issues, Diabetes, Hypertension, and Congestive Heart Failure  This patient comes in for clinic follow-up and his medical conditions.  They do include type 2 diabetes with chronic kidney disease.  He is followed by nephrology.  He also has congestive heart failure.  He cardiology he has had recent appointments with them.  At this time his hypertension is well controlled.  His diabetes looks very good and is fairly well controlled we will send the prescriptions and labs will be performed  A1c 6.4  Anxiety We will go ahead and taper off his alprazolam completely at this time.  He is under the care of of chronic pain management.  And he does feel like he can do this.  We are can have add BuSpar to help with anxiety.   Past Medical History:  Diagnosis Date  . Anxiety   . Arthritis    "qwhere" (12/20/2016)  . Chronic lower back pain   . CKD (chronic kidney disease), stage IV ([place])   . Complication of anesthesia    "got the wrong kind of anesthesia ~ 2000 when they were looking down into my stomach"  . Coronary artery disease   . Gout   . Heart disease   . Hyperlipidemia   . Hypertension   . Pneumonia ~ 2015  . Seasonal allergies   . Type II diabetes mellitus (HCC)    Relevant past medical, surgical, family and social history reviewed and updated as indicated. Interim medical history since our last visit reviewed. Allergies and medications reviewed and updated. DATA  REVIEWED: CHART IN EPIC  Family History reviewed for pertinent findings.  Review of Systems  Constitutional: Negative.  Negative for appetite change and fatigue.  Eyes: Negative for pain and visual disturbance.  Respiratory: Negative.  Negative for cough, chest tightness, shortness of breath and wheezing.   Cardiovascular: Negative.  Negative for chest pain, palpitations and leg swelling.  Gastrointestinal: Negative.  Negative for abdominal pain, diarrhea, nausea and vomiting.  Genitourinary: Negative.   Skin: Negative.  Negative for color change and rash.  Neurological: Negative.  Negative for weakness, numbness and headaches.  Psychiatric/Behavioral: Negative.     Allergies as of 06/11/2019      Reactions   Atorvastatin    All statins make him cough   Statins Cough   Pt is currently taking pravastatin    Sulfa Antibiotics       Medication List       Accurate as of June 11, 2019 11:59 PM. If you have any questions, ask your nurse or doctor.        allopurinol 100 MG tablet Commonly known as: ZYLOPRIM Take 1 tablet (100 mg total) by mouth 3 (three) times daily.   ALPRAZolam 0.25 MG tablet Commonly known as: XANAX Take 1 tab qd 14 days, 1/2 tab  QD 14 days then off What changed: See the new instructions. Changed by: Terald Sleeper, PA-C   aspirin EC 81 MG tablet Take 81 mg by mouth at bedtime.   busPIRone 10 MG tablet Commonly known as: BUSPAR Take 1 tablet (10 mg total) by mouth 3 (three) times daily. ANXIETY Started by: Terald Sleeper, PA-C   carvedilol 25 MG tablet Commonly known as: COREG TAKE ONE TABLET BY MOUTH TWICE DAILY What changed: when to take this   cetirizine 10 MG tablet Commonly known as: ZYRTEC Take 1 tablet (10 mg total) by mouth daily as needed (for allergies.).   clopidogrel 75 MG tablet Commonly known as: PLAVIX Take 1 tablet (75 mg total) by mouth at bedtime.   cyclobenzaprine 10 MG tablet Commonly known as: FLEXERIL Take 1 tablet (10  mg total) by mouth 3 (three) times daily as needed. for muscle spams What changed: when to take this   ergocalciferol 1.25 MG (50000 UT) capsule Commonly known as: VITAMIN D2 Take 1 capsule (50,000 Units total) by mouth once a week.   ezetimibe 10 MG tablet Commonly known as: ZETIA TAKE ONE (1) TABLET EACH DAY What changed: See the new instructions.   HYDROcodone-acetaminophen 10-325 MG tablet Commonly known as: NORCO Take 1 tablet by mouth every 6 (six) hours as needed (pain.).   isosorbide mononitrate 60 MG 24 hr tablet Commonly known as: IMDUR TAKE ONE (1) TABLET EACH DAY What changed: See the new instructions.   lisinopril 5 MG tablet Commonly known as: ZESTRIL Take 5 mg by mouth at bedtime.   multivitamin with minerals Tabs tablet Take 1 tablet by mouth at bedtime. Centrum Silver   nitroGLYCERIN 0.4 MG SL tablet Commonly known as: NITROSTAT Place 1 tablet (0.4 mg total) under the tongue every 5 (five) minutes as needed for chest pain.   pantoprazole 40 MG tablet Commonly known as: PROTONIX Take 1 tablet (40 mg total) by mouth daily at 6 (six) AM. What changed: when to take this   pravastatin 80 MG tablet Commonly known as: PRAVACHOL TAKE 1 TABLET DAILY IN THE EVENING What changed: when to take this   pregabalin 75 MG capsule Commonly known as: LYRICA Take 1 capsule (75 mg total) by mouth 3 (three) times daily. New lower RENAL DOSING   torsemide 20 MG tablet Commonly known as: DEMADEX Take 1 tablet (20 mg total) by mouth 2 (two) times daily. What changed: additional instructions   UltiCare Micro Pen Needles 32G X 4 MM Misc Generic drug: Insulin Pen Needle EVERY DAY   Ventolin HFA 108 (90 Base) MCG/ACT inhaler Generic drug: albuterol USE 2 PUFFS EVERY 6 HOURS AS NEEDED What changed: See the new instructions.   Victoza 18 MG/3ML Sopn Generic drug: liraglutide INJECT 0.3ML (1.8MG ) SQ DAILY What changed: See the new instructions.           Objective:    BP (!) 159/85   Pulse (!) 105   Temp 98.6 F (37 C)   Ht 5\' 6"  (1.676 m)   Wt 182 lb 6.4 oz (82.7 kg)   SpO2 93%   BMI 29.44 kg/m   Allergies  Allergen Reactions  . Atorvastatin     All statins make him cough  . Statins Cough    Pt is currently taking pravastatin   . Sulfa Antibiotics     Wt Readings from Last 3 Encounters:  06/17/19 177 lb (80.3 kg)  06/11/19 182 lb 6.4 oz (82.7 kg)  06/03/19 168 lb (  76.2 kg)    Physical Exam Vitals and nursing note reviewed.  Constitutional:      General: He is not in acute distress.    Appearance: He is well-developed.  HENT:     Head: Normocephalic and atraumatic.  Eyes:     Conjunctiva/sclera: Conjunctivae normal.     Pupils: Pupils are equal, round, and reactive to light.  Cardiovascular:     Rate and Rhythm: Normal rate and regular rhythm.     Heart sounds: Normal heart sounds.  Pulmonary:     Effort: Pulmonary effort is normal. No respiratory distress.     Breath sounds: Normal breath sounds.  Skin:    General: Skin is warm and dry.  Psychiatric:        Behavior: Behavior normal.     Results for orders placed or performed in visit on 06/11/19  Bayer DCA Hb A1c Waived  Result Value Ref Range   HB A1C (BAYER DCA - WAIVED) 6.4 <7.0 %      Assessment & Plan:   1. Type 2 diabetes mellitus with stage 2 chronic kidney disease, without long-term current use of insulin (HCC) - Bayer DCA Hb A1c Waived  2. GAD (generalized anxiety disorder) - busPIRone (BUSPAR) 10 MG tablet; Take 1 tablet (10 mg total) by mouth 3 (three) times daily. ANXIETY  Dispense: 90 tablet; Refill: 2 - ALPRAZolam (XANAX) 0.25 MG tablet; Take 1 tab qd 14 days, 1/2 tab QD 14 days then off  Dispense: 45 tablet; Refill: 0 TAPERING INSTRUCTION SENT WITH THIS FINAL SCRIPT   Continue all other maintenance medications as listed above.  Follow up plan: Keep follow up  Educational handout given for [place] PA-C [place] [address]  [place], [place] [address] [phone]   06/19/2019, 1:12 PM

## 2019-06-17 ENCOUNTER — Other Ambulatory Visit: Payer: Self-pay

## 2019-06-17 ENCOUNTER — Ambulatory Visit: Payer: Medicare Other | Admitting: Cardiovascular Disease

## 2019-06-17 ENCOUNTER — Encounter: Payer: Self-pay | Admitting: Cardiovascular Disease

## 2019-06-17 DIAGNOSIS — I739 Peripheral vascular disease, unspecified: Secondary | ICD-10-CM | POA: Diagnosis not present

## 2019-06-17 NOTE — Progress Notes (Signed)
06/17/2019 Tommy Douglas   1951/02/22  [id number]  Primary Physician Terald Sleeper, PA-C Primary Cardiologist: Lorretta Harp MD Garret Reddish, [place], [place]  HPI:  Tommy Douglas is a 69 y.o.  divorced African-American male father of 64, grandfather of 51 grandchildren.[name] He was referred by Dr.Koneswarenfor peripheral vascular evaluation because of lifestyle limiting medication.  I last saw him in the office 05/27/2019.  His cardiac risk factors are notable for ongoing tobacco abuse one pack per day for the last 45 years, treated hypertension, diabetes and hyperlipidemia. He had a myocardial infarction 15 years ago but has never had a stroke. He recently had circumflex intervention by Dr. Nile Dear days ago. He currently denies chest pain or shortness of breath. He does complain of left lower extremity claudication which is lifestyle limiting a recent Doppler in our office performed 11/30/16 revealed high-frequency signal in his left external iliac artery with a left ABI 0.89.  We were planning on performing angiography and intervention but unfortunately his girlfriend had a stroke and he needed to take care of her and therefore put off his intervention.  Now he is back with worsening claudication and wishes to proceed with angiography and intervention.  I performed peripheral angiography on him 06/02/2019 placing a 9 mm x 80 mm long Abbott absolute Pro nitinol self-expanding stent in his left common and extra iliac artery.  An excellent angiographic result.  He did have a 75% proximal right SFA stenosis.  His claudication has complete resolved and his Dopplers have normalized.    Current Meds  Medication Sig  . allopurinol (ZYLOPRIM) 100 MG tablet Take 1 tablet (100 mg total) by mouth 3 (three) times daily.  [name] ALPRAZolam (XANAX) 0.25 MG tablet Take 1 tab qd 14 days, 1/2 tab QD 14 days then off  . aspirin EC 81 MG tablet Take 81 mg by mouth at bedtime.   . busPIRone (BUSPAR) 10 MG  tablet Take 1 tablet (10 mg total) by mouth 3 (three) times daily. ANXIETY  . carvedilol (COREG) 25 MG tablet TAKE ONE TABLET BY MOUTH TWICE DAILY (Patient taking differently: Take 25 mg by mouth in the morning and at bedtime. )  . cetirizine (ZYRTEC) 10 MG tablet Take 1 tablet (10 mg total) by mouth daily as needed (for allergies.).  [name] clopidogrel (PLAVIX) 75 MG tablet Take 1 tablet (75 mg total) by mouth at bedtime.  . cyclobenzaprine (FLEXERIL) 10 MG tablet Take 1 tablet (10 mg total) by mouth 3 (three) times daily as needed. for muscle spams (Patient taking differently: Take 10 mg by mouth 3 (three) times daily. for muscle spams)  . ergocalciferol (VITAMIN D2) 1.25 MG (50000 UT) capsule Take 1 capsule (50,000 Units total) by mouth once a week.  . ezetimibe (ZETIA) 10 MG tablet TAKE ONE (1) TABLET EACH DAY (Patient taking differently: Take 10 mg by mouth at bedtime. )  . HYDROcodone-acetaminophen (NORCO) 10-325 MG tablet Take 1 tablet by mouth every 6 (six) hours as needed (pain.).  [name] isosorbide mononitrate (IMDUR) 60 MG 24 hr tablet TAKE ONE (1) TABLET EACH DAY (Patient taking differently: Take 60 mg by mouth at bedtime. )  . lisinopril (PRINIVIL,ZESTRIL) 5 MG tablet Take 5 mg by mouth at bedtime.   . Multiple Vitamin (MULTIVITAMIN WITH MINERALS) TABS tablet Take 1 tablet by mouth at bedtime. Centrum Silver  . nitroGLYCERIN (NITROSTAT) 0.4 MG SL tablet Place 1 tablet (0.4 mg total) under the tongue every 5 (five) minutes as  needed for chest pain.  . pantoprazole (PROTONIX) 40 MG tablet Take 1 tablet (40 mg total) by mouth daily at 6 (six) AM. (Patient taking differently: Take 40 mg by mouth at bedtime. )  . pravastatin (PRAVACHOL) 80 MG tablet TAKE 1 TABLET DAILY IN THE EVENING (Patient taking differently: Take 80 mg by mouth at bedtime. )  . pregabalin (LYRICA) 75 MG capsule Take 1 capsule (75 mg total) by mouth 3 (three) times daily. New lower RENAL DOSING  . torsemide (DEMADEX) 20 MG tablet  Take 1 tablet (20 mg total) by mouth 2 (two) times daily. (Patient taking differently: Take 20 mg by mouth 2 (two) times daily. 1300 & bedtime)  . ULTICARE MICRO PEN NEEDLES 32G X 4 MM MISC EVERY DAY  . VENTOLIN HFA 108 (90 Base) MCG/ACT inhaler USE 2 PUFFS EVERY 6 HOURS AS NEEDED (Patient taking differently: Inhale 2 puffs into the lungs every 6 (six) hours as needed (wheezing/shortness of breath.). )  . VICTOZA 18 MG/3ML SOPN INJECT 0.3ML (1.8MG ) SQ DAILY (Patient taking differently: Inject 1.2 mg into the skin every morning. )     Allergies  Allergen Reactions  . Atorvastatin     All statins make him cough  . Statins Cough    Pt is currently taking pravastatin   . Sulfa Antibiotics     Social History   Socioeconomic History  . Marital status: Widowed    Spouse name: Not on file  . Number of children: 7  . Years of education: Not on file  . Highest education level: High school graduate  Occupational History  . Occupation: retired     Comment: Designer, television/film set and Manufacturing engineer co.  Tobacco Use  . Smoking status: Current Every Day Smoker    Packs/day: 0.50    Years: 44.00    Pack years: 22.00    Types: Cigarettes  . Smokeless tobacco: Never Used  Substance and Sexual Activity  . Alcohol use: No  . Drug use: No  . Sexual activity: Not on file  Other Topics Concern  . Not on file  Social History Narrative  . Not on file   Social Determinants of Health   Financial Resource Strain:   . Difficulty of Paying Living Expenses:   Food Insecurity:   . Worried About Charity fundraiser in the Last Year:   . Arboriculturist in the Last Year:   Transportation Needs:   . Film/video editor (Medical):   [name] Lack of Transportation (Non-Medical):   Physical Activity:   . Days of Exercise per Week:   . Minutes of Exercise per Session:   Stress:   . Feeling of Stress :   Social Connections:   . Frequency of Communication with Friends and Family:   . Frequency of Social  Gatherings with Friends and Family:   . Attends Religious Services:   . Active Member of Clubs or Organizations:   . Attends Archivist Meetings:   [name] Marital Status:   Intimate Partner Violence:   . Fear of Current or Ex-Partner:   . Emotionally Abused:   [name] Physically Abused:   . Sexually Abused:      Review of Systems: General: negative for chills, fever, night sweats or weight changes.  Cardiovascular: negative for chest pain, dyspnea on exertion, edema, orthopnea, palpitations, paroxysmal nocturnal dyspnea or shortness of breath Dermatological: negative for rash Respiratory: negative for cough or wheezing Urologic: negative for hematuria Abdominal: negative for nausea,  vomiting, diarrhea, bright red blood per rectum, melena, or hematemesis Neurologic: negative for visual changes, syncope, or dizziness All other systems reviewed and are otherwise negative except as noted above.    Blood pressure (!) 156/76, pulse 95, height 5\' 6"  (1.676 m), weight 177 lb (80.3 kg), SpO2 96 %.  General appearance: alert and no distress Neck: no adenopathy, no carotid bruit, no JVD, supple, symmetrical, trachea midline and thyroid not enlarged, symmetric, no tenderness/mass/nodules Lungs: clear to auscultation bilaterally Heart: regular rate and rhythm, S1, S2 normal, no murmur, click, rub or gallop Extremities: extremities normal, atraumatic, no cyanosis or edema Pulses: 2+ and symmetric Skin: Skin color, texture, turgor normal. No rashes or lesions Neurologic: Alert and oriented X 3, normal strength and tone. Normal symmetric reflexes. Normal coordination and gait  EKG not performed today  ASSESSMENT AND PLAN:   PAD (peripheral artery disease) [facility]) Mr. Rapaport was referred to me by Dr. Bronson Ing for evaluation of claudication in his left leg.  He had Dopplers that suggested left iliac disease and angiography which I performed in 06/02/2019 revealed a 60% segmental left common iliac  artery stenosis and 75% left external iliac artery stenosis both of which were stented with 1 long 9 mm x 80 mm long Abbott absolute Pro nitinol self-expanding stent with an excellent result.  His lower extremity arterial Doppler studies performed 06/09/2019 revealed a widely patent stent.  His claudication has resolved.  He is on dual antiplatelet therapy.      Lorretta Harp MD FACP,FACC,FAHA, [facility] 06/17/2019 11:36 AM

## 2019-06-17 NOTE — Patient Instructions (Signed)
Medication Instructions:  NO CHANGE *If you need a refill on your cardiac medications before your next appointment, please call your pharmacy*   Lab Work: If you have labs (blood work) drawn today and your tests are completely normal, you will receive your results only by: [name] MyChart Message (if you have MyChart) OR . A paper copy in the mail If you have any lab test that is abnormal or we need to change your treatment, we will call you to review the results.  Follow-Up: At [facility], you and your health needs are our priority.  As part of our continuing mission to provide you with exceptional heart care, we have created designated Provider Care Teams.  These Care Teams include your primary Cardiologist (physician) and Advanced Practice Providers (APPs -  Physician Assistants and Nurse Practitioners) who all work together to provide you with the care you need, when you need it.  We recommend signing up for the patient portal called "MyChart".  Sign up information is provided on this After Visit Summary.  MyChart is used to connect with patients for Virtual Visits (Telemedicine).  Patients are able to view lab/test results, encounter notes, upcoming appointments, etc.  Non-urgent messages can be sent to your provider as well.   To learn more about what you can do with MyChart, go to [email or web address].    Your next appointment:   12 month(s)  The format for your next appointment:   Either In Person or Virtual  Provider:   You may see Quay Burow MD or one of the following Advanced Practice Providers on your designated Care Team:    Kerin Ransom, PA-C  [place], [place]  Coletta Memos, [place]

## 2019-06-17 NOTE — Assessment & Plan Note (Signed)
Tommy Douglas was referred to me by Dr. Bronson Ing for evaluation of claudication in his left leg.  He had Dopplers that suggested left iliac disease and angiography which I performed in 06/02/2019 revealed a 60% segmental left common iliac artery stenosis and 75% left external iliac artery stenosis both of which were stented with 1 long 9 mm x 80 mm long Abbott absolute Pro nitinol self-expanding stent with an excellent result.  His lower extremity arterial Doppler studies performed 06/09/2019 revealed a widely patent stent.  His claudication has resolved.  He is on dual antiplatelet therapy.

## 2019-06-25 ENCOUNTER — Ambulatory Visit (INDEPENDENT_AMBULATORY_CARE_PROVIDER_SITE_OTHER): Payer: Medicare Other | Admitting: Family Medicine

## 2019-06-25 ENCOUNTER — Encounter: Payer: Self-pay | Admitting: Family Medicine

## 2019-06-25 DIAGNOSIS — F17200 Nicotine dependence, unspecified, uncomplicated: Secondary | ICD-10-CM | POA: Diagnosis not present

## 2019-06-25 DIAGNOSIS — J209 Acute bronchitis, unspecified: Secondary | ICD-10-CM

## 2019-06-25 MED ORDER — GUAIFENESIN-CODEINE 100-10 MG/5ML PO SOLN: 5.0000 mL | Freq: Three times a day (TID) | ORAL | 0 refills | Status: DC | PRN

## 2019-06-25 MED ORDER — VENTOLIN HFA 108 (90 BASE) MCG/ACT IN AERS: 2.0000 | INHALATION_SPRAY | Freq: Four times a day (QID) | RESPIRATORY_TRACT | 5 refills | Status: DC | PRN

## 2019-06-25 MED ORDER — PREDNISONE 10 MG (21) PO TBPK: ORAL_TABLET | ORAL | 0 refills | Status: DC

## 2019-06-25 NOTE — Progress Notes (Signed)
Virtual Visit via Telephone Note  I connected with Tommy Douglas on 06/25/19 at 4:21 PM by telephone and verified that I am speaking with the correct person using two identifiers. Tommy Douglas is currently located at home and his girlfriend is currently with him during this visit. The provider, Loman Brooklyn, FNP is located in their home at time of visit.  I discussed the limitations, risks, security and privacy concerns of performing an evaluation and management service by telephone and the availability of in person appointments. I also discussed with the patient that there may be a patient responsible charge related to this service. The patient expressed understanding and agreed to proceed.  Subjective: PCP: Loman Brooklyn, FNP  Chief Complaint  Patient presents with  . URI   Patient complains of cough, chest congestion, shortness of breath and wheezing. Onset of symptoms was 2 weeks ago, gradually worsening since that time. He is drinking plenty of fluids. Evaluation to date: none. Treatment to date: cough suppressants and Mucinex. He does smoke.    ROS: Per HPI  Current Outpatient Medications:  .  allopurinol (ZYLOPRIM) 100 MG tablet, Take 1 tablet (100 mg total) by mouth 3 (three) times daily., Disp: 270 tablet, Rfl: 3 .  ALPRAZolam (XANAX) 0.25 MG tablet, Take 1 tab qd 14 days, 1/2 tab QD 14 days then off, Disp: 45 tablet, Rfl: 0 .  aspirin EC 81 MG tablet, Take 81 mg by mouth at bedtime. , Disp: , Rfl:  .  busPIRone (BUSPAR) 10 MG tablet, Take 1 tablet (10 mg total) by mouth 3 (three) times daily. ANXIETY, Disp: 90 tablet, Rfl: 2 .  carvedilol (COREG) 25 MG tablet, TAKE ONE TABLET BY MOUTH TWICE DAILY (Patient taking differently: Take 25 mg by mouth in the morning and at bedtime. ), Disp: 180 tablet, Rfl: 1 .  cetirizine (ZYRTEC) 10 MG tablet, Take 1 tablet (10 mg total) by mouth daily as needed (for allergies.)., Disp: 90 tablet, Rfl: 3 .  clopidogrel (PLAVIX) 75 MG  tablet, Take 1 tablet (75 mg total) by mouth at bedtime., Disp: 90 tablet, Rfl: 3 .  cyclobenzaprine (FLEXERIL) 10 MG tablet, Take 1 tablet (10 mg total) by mouth 3 (three) times daily as needed. for muscle spams (Patient taking differently: Take 10 mg by mouth 3 (three) times daily. for muscle spams), Disp: 90 tablet, Rfl: 5 .  ergocalciferol (VITAMIN D2) 1.25 MG (50000 UT) capsule, Take 1 capsule (50,000 Units total) by mouth once a week., Disp: 12 capsule, Rfl: 3 .  ezetimibe (ZETIA) 10 MG tablet, TAKE ONE (1) TABLET EACH DAY (Patient taking differently: Take 10 mg by mouth at bedtime. ), Disp: 90 tablet, Rfl: 2 .  HYDROcodone-acetaminophen (NORCO) 10-325 MG tablet, Take 1 tablet by mouth every 6 (six) hours as needed (pain.)., Disp: , Rfl:  .  isosorbide mononitrate (IMDUR) 60 MG 24 hr tablet, TAKE ONE (1) TABLET EACH DAY (Patient taking differently: Take 60 mg by mouth at bedtime. ), Disp: 90 tablet, Rfl: 1 .  lisinopril (PRINIVIL,ZESTRIL) 5 MG tablet, Take 5 mg by mouth at bedtime. , Disp: , Rfl:  .  Multiple Vitamin (MULTIVITAMIN WITH MINERALS) TABS tablet, Take 1 tablet by mouth at bedtime. Centrum Silver, Disp: , Rfl:  .  nitroGLYCERIN (NITROSTAT) 0.4 MG SL tablet, Place 1 tablet (0.4 mg total) under the tongue every 5 (five) minutes as needed for chest pain., Disp: 25 tablet, Rfl: 3 .  pantoprazole (PROTONIX) 40 MG tablet, Take  1 tablet (40 mg total) by mouth daily at 6 (six) AM. (Patient taking differently: Take 40 mg by mouth at bedtime. ), Disp: 30 tablet, Rfl: 5 .  pravastatin (PRAVACHOL) 80 MG tablet, TAKE 1 TABLET DAILY IN THE EVENING (Patient taking differently: Take 80 mg by mouth at bedtime. ), Disp: 90 tablet, Rfl: 1 .  pregabalin (LYRICA) 75 MG capsule, Take 1 capsule (75 mg total) by mouth 3 (three) times daily. New lower RENAL DOSING, Disp: 90 capsule, Rfl: 5 .  torsemide (DEMADEX) 20 MG tablet, Take 1 tablet (20 mg total) by mouth 2 (two) times daily. (Patient taking differently:  Take 20 mg by mouth 2 (two) times daily. 1300 & bedtime), Disp: 180 tablet, Rfl: 1 .  ULTICARE MICRO PEN NEEDLES 32G X 4 MM MISC, EVERY DAY, Disp: 100 each, Rfl: 3 .  VENTOLIN HFA 108 (90 Base) MCG/ACT inhaler, USE 2 PUFFS EVERY 6 HOURS AS NEEDED (Patient taking differently: Inhale 2 puffs into the lungs every 6 (six) hours as needed (wheezing/shortness of breath.). ), Disp: 18 g, Rfl: 6 .  VICTOZA 18 MG/3ML SOPN, INJECT 0.3ML (1.8MG ) SQ DAILY (Patient taking differently: Inject 1.2 mg into the skin every morning. ), Disp: 27 mL, Rfl: 13  Allergies  Allergen Reactions  . Atorvastatin     All statins make him cough  . Statins Cough    Pt is currently taking pravastatin   . Sulfa Antibiotics    Past Medical History:  Diagnosis Date  . Anxiety   . Arthritis    "qwhere" (12/20/2016)  . Chronic lower back pain   . CKD (chronic kidney disease), stage IV ([place])   . Complication of anesthesia    "got the wrong kind of anesthesia ~ 2000 when they were looking down into my stomach"  . Coronary artery disease   . Gout   . Heart disease   . Hyperlipidemia   . Hypertension   . Pneumonia ~ 2015  . Seasonal allergies   . Type II diabetes mellitus (HCC)     Observations/Objective: A&O  No respiratory distress or wheezing audible over the phone Mood, judgement, and thought processes all WNL   Assessment and Plan: 1. Acute bronchitis, unspecified organism - Education provided on acute bronchitis. Continue Mucinex twice daily with a full glass of water.  - predniSONE (STERAPRED UNI-PAK 21 TAB) 10 MG (21) TBPK tablet; As directed x 6 days  Dispense: 21 tablet; Refill: 0 - VENTOLIN HFA 108 (90 Base) MCG/ACT inhaler; Inhale 2 puffs into the lungs every 6 (six) hours as needed for wheezing or shortness of breath.  Dispense: 18 g; Refill: 5 - guaiFENesin-codeine 100-10 MG/5ML syrup; Take 5 mLs by mouth 3 (three) times daily as needed for cough.  Dispense: 120 mL; Refill: 0   Follow Up  Instructions:  I discussed the assessment and treatment plan with the patient. The patient was provided an opportunity to ask questions and all were answered. The patient agreed with the plan and demonstrated an understanding of the instructions.   The patient was advised to call back or seek an in-person evaluation if the symptoms worsen or if the condition fails to improve as anticipated.  The above assessment and management plan was discussed with the patient. The patient verbalized understanding of and has agreed to the management plan. Patient is aware to call the clinic if symptoms persist or worsen. Patient is aware when to return to the clinic for a follow-up visit. Patient educated on when  it is appropriate to go to the emergency department.   Time call ended: 4:30 PM  I provided 11 minutes of non-face-to-face time during this encounter.  Hendricks Limes, MSN, APRN, FNP-C [facility] 06/25/19

## 2019-06-25 NOTE — Patient Instructions (Signed)

## 2019-07-10 NOTE — Telephone Encounter (Signed)
Opened in error

## 2019-07-17 ENCOUNTER — Ambulatory Visit (INDEPENDENT_AMBULATORY_CARE_PROVIDER_SITE_OTHER): Payer: Medicare Other | Admitting: Pharmacist

## 2019-07-17 DIAGNOSIS — E1122 Type 2 diabetes mellitus with diabetic chronic kidney disease: Secondary | ICD-10-CM

## 2019-07-17 DIAGNOSIS — N182 Chronic kidney disease, stage 2 (mild): Secondary | ICD-10-CM | POA: Diagnosis not present

## 2019-07-18 ENCOUNTER — Telehealth: Payer: Self-pay | Admitting: *Deleted

## 2019-07-18 DIAGNOSIS — J209 Acute bronchitis, unspecified: Secondary | ICD-10-CM

## 2019-07-18 MED ORDER — ALBUTEROL SULFATE HFA 108 (90 BASE) MCG/ACT IN AERS: 2.0000 | INHALATION_SPRAY | Freq: Four times a day (QID) | RESPIRATORY_TRACT | 5 refills | Status: AC | PRN

## 2019-07-18 NOTE — Telephone Encounter (Signed)
Ventolin hfa 108 (90 base) mcg/act aerosol Is not covered by pt plan.  Please try one of the following:  Albuterol Sulfate HFAA ProAir Respiclick Proair Digihaler Xopenex HFA Lealbuterol Tartrate Albuterol Sulfate 2.5 mg / 3 ml ( 0.083%) Neb solution

## 2019-07-18 NOTE — Telephone Encounter (Signed)
Albuterol sent

## 2019-07-22 NOTE — Progress Notes (Signed)
  Pharmacy Clinic Diabetes  07/17/2019 Name: Tommy Douglas MRN: [id number] DOB: [birth year]  S:  53 yoM presents for COPD & diabetes evaluation, education, and management.  Patient was referred and last seen by Primary Care Provider on 06/25/18.  Insurance coverage/medication affordability: [employer] medicare  Patient reports adherence with medications. Current diabetes medications include: victoza Current hypertension medications include: lisinopril, carvedilol, isosorbide Current hyperlipidemia medications include: pravastatin   Patient denies hypoglycemic events.  O:   Lab Results  Component Value Date   HGBA1C 6.4 06/11/2019    Lipid Panel     Component Value Date/Time   CHOL 105 03/11/2019 1356   TRIG 150 (H) 03/11/2019 1356   HDL 25 (L) 03/11/2019 1356   CHOLHDL 4.2 03/11/2019 1356   LDLCALC 54 03/11/2019 1356    Home fasting blood sugars: 90-13  A/P:  Diabetes longstanding T2DM currently controlled. Patient is able to verbalize appropriate hypoglycemia management plan. Patient is adherent with medication.   -Continued GLP-1 Vicotza (generic name: liraglutide) --discussed potentially transitioning to once weekly Ozempic  -For COPD--patient continues to smoke but is trying to cut back.  Encouraged patient to reach out if he is ready to start pharmalogical therapy to aid in this endeavor.  He states he has to use his rescue inhaler multiple times per week.  Discussed starting a LAMA or LAMA/LABA at next PCP visit  -Extensively discussed pathophysiology of diabetes, recommended lifestyle interventions, dietary effects on blood sugar control  -Counseled on s/sx of and management of hypoglycemia  -Next A1C anticipated 09/11/19.      Written patient instructions provided.  Total time counseling 15 minutes.   Follow up PCP Clinic Visit in 08/2019.       Regina Eck, PharmD, BCPS Clinical Pharmacist, [place]  II Phone  [phone]

## 2019-08-22 ENCOUNTER — Other Ambulatory Visit: Payer: Self-pay | Admitting: *Deleted

## 2019-08-22 MED ORDER — VICTOZA 18 MG/3ML ~~LOC~~ SOPN: PEN_INJECTOR | SUBCUTANEOUS | 2 refills | Status: DC

## 2019-08-27 LAB — HM DIABETES EYE EXAM

## 2019-09-10 ENCOUNTER — Other Ambulatory Visit: Payer: Self-pay | Admitting: Cardiovascular Disease

## 2019-09-11 ENCOUNTER — Ambulatory Visit (INDEPENDENT_AMBULATORY_CARE_PROVIDER_SITE_OTHER): Payer: Medicare Other | Admitting: Family Medicine

## 2019-09-11 ENCOUNTER — Ambulatory Visit: Payer: Medicare Other | Admitting: Physician Assistant

## 2019-09-11 ENCOUNTER — Other Ambulatory Visit: Payer: Self-pay

## 2019-09-11 ENCOUNTER — Encounter: Payer: Self-pay | Admitting: Family Medicine

## 2019-09-11 VITALS — BP 145/77 | HR 93 | Temp 97.3°F | Ht 66.0 in | Wt 179.6 lb

## 2019-09-11 DIAGNOSIS — I739 Peripheral vascular disease, unspecified: Secondary | ICD-10-CM

## 2019-09-11 DIAGNOSIS — M1A9XX0 Chronic gout, unspecified, without tophus (tophi): Secondary | ICD-10-CM

## 2019-09-11 DIAGNOSIS — I2510 Atherosclerotic heart disease of native coronary artery without angina pectoris: Secondary | ICD-10-CM

## 2019-09-11 DIAGNOSIS — E1169 Type 2 diabetes mellitus with other specified complication: Secondary | ICD-10-CM | POA: Diagnosis not present

## 2019-09-11 DIAGNOSIS — Z9861 Coronary angioplasty status: Secondary | ICD-10-CM

## 2019-09-11 DIAGNOSIS — I10 Essential (primary) hypertension: Secondary | ICD-10-CM | POA: Diagnosis not present

## 2019-09-11 DIAGNOSIS — E785 Hyperlipidemia, unspecified: Secondary | ICD-10-CM

## 2019-09-11 DIAGNOSIS — H0259 Other disorders affecting eyelid function: Secondary | ICD-10-CM

## 2019-09-11 DIAGNOSIS — N1832 Chronic kidney disease, stage 3b: Secondary | ICD-10-CM

## 2019-09-11 DIAGNOSIS — J3089 Other allergic rhinitis: Secondary | ICD-10-CM

## 2019-09-11 DIAGNOSIS — N182 Chronic kidney disease, stage 2 (mild): Secondary | ICD-10-CM

## 2019-09-11 DIAGNOSIS — F411 Generalized anxiety disorder: Secondary | ICD-10-CM

## 2019-09-11 DIAGNOSIS — E1122 Type 2 diabetes mellitus with diabetic chronic kidney disease: Secondary | ICD-10-CM | POA: Diagnosis not present

## 2019-09-11 DIAGNOSIS — I5032 Chronic diastolic (congestive) heart failure: Secondary | ICD-10-CM

## 2019-09-11 DIAGNOSIS — M5136 Other intervertebral disc degeneration, lumbar region: Secondary | ICD-10-CM

## 2019-09-11 LAB — BAYER DCA HB A1C WAIVED: HB A1C (BAYER DCA - WAIVED): 6.3 % (ref <7.0)

## 2019-09-11 MED ORDER — VICTOZA 18 MG/3ML ~~LOC~~ SOPN: PEN_INJECTOR | SUBCUTANEOUS | 2 refills | Status: DC

## 2019-09-11 MED ORDER — ALLOPURINOL 100 MG PO TABS: 100.0000 mg | ORAL_TABLET | Freq: Three times a day (TID) | ORAL | 1 refills | Status: DC

## 2019-09-11 MED ORDER — CLOPIDOGREL BISULFATE 75 MG PO TABS: 75.0000 mg | ORAL_TABLET | Freq: Every day | ORAL | 1 refills | Status: DC

## 2019-09-11 MED ORDER — PRAVASTATIN SODIUM 80 MG PO TABS: 80.0000 mg | ORAL_TABLET | Freq: Every evening | ORAL | 1 refills | Status: DC

## 2019-09-11 MED ORDER — EZETIMIBE 10 MG PO TABS: 10.0000 mg | ORAL_TABLET | Freq: Every day | ORAL | 3 refills | Status: DC

## 2019-09-11 MED ORDER — TORSEMIDE 20 MG PO TABS: 20.0000 mg | ORAL_TABLET | Freq: Two times a day (BID) | ORAL | 1 refills | Status: DC

## 2019-09-11 MED ORDER — CYCLOBENZAPRINE HCL 10 MG PO TABS: 10.0000 mg | ORAL_TABLET | Freq: Every day | ORAL | 1 refills | Status: DC

## 2019-09-11 MED ORDER — LISINOPRIL 5 MG PO TABS: 5.0000 mg | ORAL_TABLET | Freq: Every day | ORAL | 1 refills | Status: DC

## 2019-09-11 MED ORDER — PANTOPRAZOLE SODIUM 40 MG PO TBEC: 40.0000 mg | DELAYED_RELEASE_TABLET | Freq: Every day | ORAL | 1 refills | Status: DC

## 2019-09-11 MED ORDER — EZETIMIBE 10 MG PO TABS: 10.0000 mg | ORAL_TABLET | Freq: Every day | ORAL | 1 refills | Status: DC

## 2019-09-11 MED ORDER — CARVEDILOL 25 MG PO TABS: 25.0000 mg | ORAL_TABLET | Freq: Two times a day (BID) | ORAL | 1 refills | Status: DC

## 2019-09-11 MED ORDER — ESCITALOPRAM OXALATE 10 MG PO TABS: 10.0000 mg | ORAL_TABLET | Freq: Every day | ORAL | 2 refills | Status: DC

## 2019-09-11 MED ORDER — PRAVASTATIN SODIUM 80 MG PO TABS: 80.0000 mg | ORAL_TABLET | Freq: Every evening | ORAL | 0 refills | Status: DC

## 2019-09-11 MED ORDER — ISOSORBIDE MONONITRATE ER 60 MG PO TB24: ORAL_TABLET | ORAL | 1 refills | Status: DC

## 2019-09-11 NOTE — Progress Notes (Signed)
Assessment & Plan:  1. Type 2 diabetes mellitus with stage 2 chronic kidney disease, without long-term current use of insulin (HCC) Lab Results  Component Value Date   HGBA1C 6.3 09/11/2019   HGBA1C 6.4 06/11/2019   HGBA1C 6.3 03/11/2019  - Diabetes is at goal of A1c < 7. - Medications: continue current medications - Home glucose monitoring: continue monitoring - Patient is currently taking a statin. Patient is taking an ACE-inhibitor/ARB.  - Urine Microalbumin/Creat Ratio: 03/11/2019 - Bayer DCA Hb A1c Waived - CBC with Differential/Platelet - CMP14+EGFR - Lipid panel  2. Essential hypertension - Well controlled on current regimen.   3. Hyperlipidemia associated with type 2 diabetes mellitus ([place]) - Well controlled on current regimen.  - CMP14+EGFR - Lipid panel  4. CAD S/P percutaneous coronary angioplasty - Continue statin. Managed by cardiology.   5. Chronic heart failure with preserved ejection fraction ([place]) - Well controlled on current regimen. Managed by cardiology.  6. PVD (peripheral vascular disease) ([place]) - Well controlled on current regimen. Managed by cardiology.    7. Chronic gout without tophus, unspecified cause, unspecified site - Well controlled on current regimen.  - allopurinol (ZYLOPRIM) 100 MG tablet; Take 1 tablet (100 mg total) by mouth 3 (three) times daily.  Dispense: 270 tablet; Refill: 1 - CBC with Differential/Platelet  8. DDD (degenerative disc disease), lumbar - Well controlled on current regimen. Managed by pain clinic.  - cyclobenzaprine (FLEXERIL) 10 MG tablet; Take 1 tablet (10 mg total) by mouth at bedtime. for muscle spams  Dispense: 90 tablet; Refill: 1  9. GAD (generalized anxiety disorder) - Potentially causing excessive involuntary blinking? Started on Lexapro 10 mg QD.   10. Stage 3b chronic kidney disease - CMP  11. Chronic nonseasonal allergic rhinitis due to pollen - Well controlled on current regimen.   12.  Excessive involuntary blinking - Ambulatory referral to Neurology   Return in about 6 weeks (around 10/23/2019) for eyes/SSRI.  Hendricks Limes, MSN, APRN, FNP-C [facility]  Subjective:    Patient ID: Tommy Douglas, male    DOB: 1950-04-11, 69 y.o.   MRN: [id number]  Patient Care Team: Loman Brooklyn, FNP as PCP - General (Family Medicine) Herminio Commons, MD as PCP - Cardiology (Cardiology) Herminio Commons, MD as Attending Physician (Cardiology) Lorretta Harp, MD as Consulting Physician (Cardiology) Fran Lowes, MD (Inactive) as Consulting Physician (Nephrology) Gala Romney Cristopher Estimable, MD as Consulting Physician (Gastroenterology) Lavera Guise, [facility] : [facility] (Pharmacist)   Chief Complaint:  Chief Complaint  Patient presents with  . Establish Care    jones pt  . Diabetes    3 month follow up     HPI: OTHNIEL MARET is a 69 y.o. male presenting on 09/11/2019 for Establish Care (jones pt) and Diabetes (3 month follow up )  Diabetes: Patient presents for follow up of diabetes. Current symptoms include: none. Known diabetic complications: nephropathy. Medication compliance: yes. Current diet: in general, an "unhealthy" diet. Current exercise: none. Home blood sugar records: 120-130s. Is he  on ACE inhibitor or angiotensin II receptor blocker? Yes. Is he on a statin? Yes.   Lab Results  Component Value Date   HGBA1C 6.3 09/11/2019   HGBA1C 6.4 06/11/2019   HGBA1C 6.3 03/11/2019   Lab Results  Component Value Date   LDLCALC 63 09/11/2019   CREATININE 1.67 (H) 09/11/2019     Patient reports he was taking Xanax for "eye jumping" and quick repetitive blinking of his  eyes. It happens whether he is at home on the couch or driving a car and he finds it very annoying. It is bilaterally. It is NOT a twitch of the eyelid. He has never had any kind of work-up to make sure he is not having some type of seizure.   He reports taking buspirone in the  past, but states that it made him make him feel like he was going to go out and do something crazy.   Patient takes Albuterol as needed for shortness of breath but reports the use is rare.    Social history:  Relevant past medical, surgical, family and social history reviewed and updated as indicated. Interim medical history since our last visit reviewed.  Allergies and medications reviewed and updated.  DATA REVIEWED: CHART IN EPIC  ROS: Negative unless specifically indicated above in HPI.    Current Outpatient Medications:  .  albuterol (VENTOLIN HFA) 108 (90 Base) MCG/ACT inhaler, Inhale 2 puffs into the lungs every 6 (six) hours as needed for wheezing or shortness of breath., Disp: 18 g, Rfl: 5 .  allopurinol (ZYLOPRIM) 100 MG tablet, Take 1 tablet (100 mg total) by mouth 3 (three) times daily., Disp: 270 tablet, Rfl: 3 .  ALPRAZolam (XANAX) 0.25 MG tablet, Take 1 tab qd 14 days, 1/2 tab QD 14 days then off, Disp: 45 tablet, Rfl: 0 .  aspirin EC 81 MG tablet, Take 81 mg by mouth at bedtime. , Disp: , Rfl:  .  carvedilol (COREG) 25 MG tablet, TAKE ONE TABLET BY MOUTH TWICE DAILY (Patient taking differently: Take 25 mg by mouth in the morning and at bedtime. ), Disp: 180 tablet, Rfl: 1 .  cetirizine (ZYRTEC) 10 MG tablet, Take 1 tablet (10 mg total) by mouth daily as needed (for allergies.)., Disp: 90 tablet, Rfl: 3 .  clopidogrel (PLAVIX) 75 MG tablet, Take 1 tablet (75 mg total) by mouth at bedtime., Disp: 90 tablet, Rfl: 3 .  cyclobenzaprine (FLEXERIL) 10 MG tablet, Take 1 tablet (10 mg total) by mouth 3 (three) times daily as needed. for muscle spams (Patient taking differently: Take 10 mg by mouth 3 (three) times daily. for muscle spams), Disp: 90 tablet, Rfl: 5 .  ergocalciferol (VITAMIN D2) 1.25 MG (50000 UT) capsule, Take 1 capsule (50,000 Units total) by mouth once a week., Disp: 12 capsule, Rfl: 3 .  ezetimibe (ZETIA) 10 MG tablet, Take 1 tablet (10 mg total) by mouth at  bedtime., Disp: 90 tablet, Rfl: 3 .  HYDROcodone-acetaminophen (NORCO) 10-325 MG tablet, Take 1 tablet by mouth every 6 (six) hours as needed (pain.)., Disp: , Rfl:  .  isosorbide mononitrate (IMDUR) 60 MG 24 hr tablet, TAKE ONE (1) TABLET EACH DAY, Disp: 90 tablet, Rfl: 1 .  liraglutide (VICTOZA) 18 MG/3ML SOPN, INJECT 0.3ML (1.8MG) SQ DAILY, Disp: 27 mL, Rfl: 2 .  lisinopril (PRINIVIL,ZESTRIL) 5 MG tablet, Take 5 mg by mouth at bedtime. , Disp: , Rfl:  .  Multiple Vitamin (MULTIVITAMIN WITH MINERALS) TABS tablet, Take 1 tablet by mouth at bedtime. Centrum Silver, Disp: , Rfl:  .  nitroGLYCERIN (NITROSTAT) 0.4 MG SL tablet, Place 1 tablet (0.4 mg total) under the tongue every 5 (five) minutes as needed for chest pain., Disp: 25 tablet, Rfl: 3 .  pantoprazole (PROTONIX) 40 MG tablet, Take 1 tablet (40 mg total) by mouth daily at 6 (six) AM. (Patient taking differently: Take 40 mg by mouth at bedtime. ), Disp: 30 tablet, Rfl: 5 .  pravastatin (PRAVACHOL) 80 MG tablet, Take 1 tablet (80 mg total) by mouth every evening., Disp: 90 tablet, Rfl: 0 .  pregabalin (LYRICA) 75 MG capsule, Take 1 capsule (75 mg total) by mouth 3 (three) times daily. New lower RENAL DOSING, Disp: 90 capsule, Rfl: 5 .  torsemide (DEMADEX) 20 MG tablet, Take 1 tablet (20 mg total) by mouth 2 (two) times daily. (Patient taking differently: Take 20 mg by mouth 2 (two) times daily. 1300 & bedtime), Disp: 180 tablet, Rfl: 1 .  ULTICARE MICRO PEN NEEDLES 32G X 4 MM MISC, EVERY DAY, Disp: 100 each, Rfl: 3   Allergies  Allergen Reactions  . Atorvastatin     All statins make him cough  . Statins Cough    Pt is currently taking pravastatin   . Sulfa Antibiotics    Past Medical History:  Diagnosis Date  . Anxiety   . Arthritis    "qwhere" (12/20/2016)  . Chronic lower back pain   . CKD (chronic kidney disease), stage IV ([place])   . Complication of anesthesia    "got the wrong kind of anesthesia ~ 2000 when they were looking down  into my stomach"  . Coronary artery disease   . Gout   . Heart disease   . Hyperlipidemia   . Hypertension   . Pneumonia ~ 2015  . Seasonal allergies   . Type II diabetes mellitus ([place])     Past Surgical History:  Procedure Laterality Date  . ABDOMINAL AORTOGRAM W/LOWER EXTREMITY N/A 06/02/2019   Procedure: ABDOMINAL AORTOGRAM W/LOWER EXTREMITY;  Surgeon: Lorretta Harp, MD;  Location: [facility];  Service: Cardiovascular;  Laterality: N/A;  . CARDIAC CATHETERIZATION  12/20/2016  . CORONARY ANGIOPLASTY WITH STENT PLACEMENT  2004 X 2   "1st stent moved"  . CORONARY STENT INTERVENTION N/A 12/21/2016   Procedure: CORONARY STENT INTERVENTION;  Surgeon: Burnell Blanks, MD;  Location: [facility];  Service: Cardiovascular;  Laterality: N/A;  . ESOPHAGOGASTRODUODENOSCOPY  ~ 2000  . LAPAROSCOPIC CHOLECYSTECTOMY    . LEFT HEART CATH AND CORONARY ANGIOGRAPHY N/A 12/20/2016   Procedure: LEFT HEART CATH AND CORONARY ANGIOGRAPHY;  Surgeon: Burnell Blanks, MD;  Location: [facility];  Service: Cardiovascular;  Laterality: N/A;    Social History   Socioeconomic History  . Marital status: Widowed    Spouse name: Not on file  . Number of children: 7  . Years of education: Not on file  . Highest education level: High school graduate  Occupational History  . Occupation: retired     Comment: Designer, television/film set and Manufacturing engineer co.  Tobacco Use  . Smoking status: Current Every Day Smoker    Packs/day: 0.50    Years: 44.00    Pack years: 22.00    Types: Cigarettes  . Smokeless tobacco: Never Used  Vaping Use  . Vaping Use: Never used  Substance and Sexual Activity  . Alcohol use: No  . Drug use: No  . Sexual activity: Not on file  Other Topics Concern  . Not on file  Social History Narrative  . Not on file   Social Determinants of Health   Financial Resource Strain:   . Difficulty of Paying Living Expenses:   Food Insecurity:   . Worried  About Charity fundraiser in the Last Year:   . Arboriculturist in the Last Year:   Transportation Needs:   . Film/video editor (Medical):   [name]  Lack of Transportation (Non-Medical):   Physical Activity:   . Days of Exercise per Week:   . Minutes of Exercise per Session:   Stress:   . Feeling of Stress :   Social Connections:   . Frequency of Communication with Friends and Family:   . Frequency of Social Gatherings with Friends and Family:   . Attends Religious Services:   . Active Member of Clubs or Organizations:   . Attends Archivist Meetings:   [name] Marital Status:   Intimate Partner Violence:   . Fear of Current or Ex-Partner:   . Emotionally Abused:   [name] Physically Abused:   . Sexually Abused:         Objective:    BP (!) 145/77   Pulse 93   Temp (!) 97.3 F (36.3 C) (Temporal)   Ht 5' 6"  (1.676 m)   Wt 179 lb 9.6 oz (81.5 kg)   SpO2 97%   BMI 28.99 kg/m   Wt Readings from Last 3 Encounters:  09/11/19 179 lb 9.6 oz (81.5 kg)  06/17/19 177 lb (80.3 kg)  06/11/19 182 lb 6.4 oz (82.7 kg)    Physical Exam Vitals reviewed.  Constitutional:      General: He is not in acute distress.    Appearance: Normal appearance. He is overweight. He is not ill-appearing, toxic-appearing or diaphoretic.  HENT:     Head: Normocephalic and atraumatic.  Eyes:     General: No scleral icterus.       Right eye: No discharge.        Left eye: No discharge.     Conjunctiva/sclera: Conjunctivae normal.  Cardiovascular:     Rate and Rhythm: Normal rate and regular rhythm.     Heart sounds: Normal heart sounds. No murmur heard.  No friction rub. No gallop.   Pulmonary:     Effort: Pulmonary effort is normal. No respiratory distress.     Breath sounds: Normal breath sounds. No stridor. No wheezing, rhonchi or rales.  Musculoskeletal:        General: Normal range of motion.     Cervical back: Normal range of motion.  Skin:    General: Skin is warm and dry.   Neurological:     Mental Status: He is alert and oriented to person, place, and time. Mental status is at baseline.  Psychiatric:        Mood and Affect: Mood normal.        Behavior: Behavior normal.        Thought Content: Thought content normal.        Judgment: Judgment normal.     Lab Results  Component Value Date   TSH 1.720 03/11/2019   Lab Results  Component Value Date   WBC 7.5 09/11/2019   HGB 13.1 09/11/2019   HCT 40.9 09/11/2019   MCV 87 09/11/2019   PLT 230 09/11/2019   Lab Results  Component Value Date   NA 139 09/11/2019   K 4.2 09/11/2019   CO2 25 09/11/2019   GLUCOSE 208 (H) 09/11/2019   BUN 21 09/11/2019   CREATININE 1.67 (H) 09/11/2019   BILITOT 0.3 09/11/2019   ALKPHOS 109 09/11/2019   AST 29 09/11/2019   ALT 16 09/11/2019   PROT 7.1 09/11/2019   ALBUMIN 4.0 09/11/2019   CALCIUM 9.3 09/11/2019   ANIONGAP 8 12/22/2016   Lab Results  Component Value Date   CHOL 120 09/11/2019   Lab Results  Component Value Date  HDL 21 (L) 09/11/2019   Lab Results  Component Value Date   LDLCALC 63 09/11/2019   Lab Results  Component Value Date   TRIG 218 (H) 09/11/2019   Lab Results  Component Value Date   CHOLHDL 5.7 (H) 09/11/2019   Lab Results  Component Value Date   HGBA1C 6.3 09/11/2019

## 2019-09-11 NOTE — Telephone Encounter (Signed)
Refilled zetia.

## 2019-09-12 LAB — CMP14+EGFR
ALT: 16 IU/L (ref 0–44)
AST: 29 IU/L (ref 0–40)
Albumin/Globulin Ratio: 1.3 (ref 1.2–2.2)
Albumin: 4 g/dL (ref 3.8–4.8)
Alkaline Phosphatase: 109 IU/L (ref 48–121)
BUN/Creatinine Ratio: 13 (ref 10–24)
BUN: 21 mg/dL (ref 8–27)
Bilirubin Total: 0.3 mg/dL (ref 0.0–1.2)
CO2: 25 mmol/L (ref 20–29)
Calcium: 9.3 mg/dL (ref 8.6–10.2)
Chloride: 99 mmol/L (ref 96–106)
Creatinine, Ser: 1.67 mg/dL — ABNORMAL HIGH (ref 0.76–1.27)
GFR calc Af Amer: 48 mL/min/{1.73_m2} — ABNORMAL LOW (ref >59)
GFR calc non Af Amer: 41 mL/min/{1.73_m2} — ABNORMAL LOW (ref >59)
Globulin, Total: 3.1 g/dL (ref 1.5–4.5)
Glucose: 208 mg/dL — ABNORMAL HIGH (ref 65–99)
Potassium: 4.2 mmol/L (ref 3.5–5.2)
Sodium: 139 mmol/L (ref 134–144)
Total Protein: 7.1 g/dL (ref 6.0–8.5)

## 2019-09-12 LAB — CBC WITH DIFFERENTIAL/PLATELET
Basophils Absolute: 0.1 10*3/uL (ref 0.0–0.2)
Basos: 2 %
EOS (ABSOLUTE): 0.5 10*3/uL — ABNORMAL HIGH (ref 0.0–0.4)
Eos: 7 %
Hematocrit: 40.9 % (ref 37.5–51.0)
Hemoglobin: 13.1 g/dL (ref 13.0–17.7)
Immature Grans (Abs): 0 10*3/uL (ref 0.0–0.1)
Immature Granulocytes: 0 %
Lymphocytes Absolute: 1.9 10*3/uL (ref 0.7–3.1)
Lymphs: 26 %
MCH: 27.8 pg (ref 26.6–33.0)
MCHC: 32 g/dL (ref 31.5–35.7)
MCV: 87 fL (ref 79–97)
Monocytes Absolute: 0.6 10*3/uL (ref 0.1–0.9)
Monocytes: 8 %
Neutrophils Absolute: 4.3 10*3/uL (ref 1.4–7.0)
Neutrophils: 57 %
Platelets: 230 10*3/uL (ref 150–450)
RBC: 4.72 x10E6/uL (ref 4.14–5.80)
RDW: 13.6 % (ref 11.6–15.4)
WBC: 7.5 10*3/uL (ref 3.4–10.8)

## 2019-09-12 LAB — LIPID PANEL
Chol/HDL Ratio: 5.7 ratio — ABNORMAL HIGH (ref 0.0–5.0)
Cholesterol, Total: 120 mg/dL (ref 100–199)
HDL: 21 mg/dL — ABNORMAL LOW (ref >39)
LDL Chol Calc (NIH): 63 mg/dL (ref 0–99)
Triglycerides: 218 mg/dL — ABNORMAL HIGH (ref 0–149)
VLDL Cholesterol Cal: 36 mg/dL (ref 5–40)

## 2019-09-21 ENCOUNTER — Encounter: Payer: Self-pay | Admitting: Family Medicine

## 2019-09-21 DIAGNOSIS — I5032 Chronic diastolic (congestive) heart failure: Secondary | ICD-10-CM | POA: Insufficient documentation

## 2019-10-21 NOTE — Progress Notes (Signed)
CARDIOLOGY CONSULT NOTE       Patient ID: Tommy Douglas MRN: [id number] DOB/AGE: 1950/10/20 69 y.o.  Referring Physician: Hildred Alamin Primary Physician: Loman Brooklyn, FNP Primary Cardiologist: Berry/[place] Reason for Consultation: CAD/PVD  Active Problems:   * No active hospital problems. *   HPI:  69 y.o. new to me Has been seen by Dr Jacinta Shoe, Gwenlyn Found and [facility] for his CAD and PVD. Last cath by CM done 12/20/16 showed patent stent in proximal RCA with stenting of the mid RCA done the distal RCA/PLB disease but small and not Rx Had collaterals to the circumflex with sub total occclusion in the proximal segment that was also not Rx. Sees Dr Gwenlyn Found for PVD LLE claudication with stenting of the left external iliac 06/02/19. He is a smoker with HTN , HLD and DM Last echo done 10/11/17 had normal EF with no significant valve disease   Having severe pain in left groin since procedure Sharp stabbing pain Does not complain about claudication  ROS All other systems reviewed and negative except as noted above  Past Medical History:  Diagnosis Date  . Anxiety   . Arthritis    "qwhere" (12/20/2016)  . Chronic lower back pain   . CKD (chronic kidney disease), stage IV ([place])   . Complication of anesthesia    "got the wrong kind of anesthesia ~ 2000 when they were looking down into my stomach"  . Coronary artery disease   . Gout   . Heart disease   . Hyperlipidemia   . Hypertension   . Pneumonia ~ 2015  . Seasonal allergies   . Type II diabetes mellitus (HCC)     Family History  Problem Relation Age of Onset  . Cancer Mother        male  . Diabetes Mother   . Stroke Father        x3  . Stroke Brother        x2   . Other Sister        bowel necrosis?   . Other Daughter        gout  . Other Son        gout  . Alcohol abuse Brother   . Other Sister        pneumonia  . Other Sister        pneumonia  . Cancer Daughter        male  . Other Daughter         fluid on brain - disabled     Social History   Socioeconomic History  . Marital status: Widowed    Spouse name: Not on file  . Number of children: 7  . Years of education: Not on file  . Highest education level: High school graduate  Occupational History  . Occupation: retired     Comment: Designer, television/film set and Manufacturing engineer co.  Tobacco Use  . Smoking status: Current Every Day Smoker    Packs/day: 0.50    Years: 44.00    Pack years: 22.00    Types: Cigarettes  . Smokeless tobacco: Never Used  Vaping Use  . Vaping Use: Never used  Substance and Sexual Activity  . Alcohol use: No  . Drug use: No  . Sexual activity: Not on file  Other Topics Concern  . Not on file  Social History Narrative  . Not on file   Social Determinants of Health   Financial Resource Strain:   . Difficulty  of Paying Living Expenses:   Food Insecurity:   . Worried About Charity fundraiser in the Last Year:   . Arboriculturist in the Last Year:   Transportation Needs:   . Film/video editor (Medical):   [name] Lack of Transportation (Non-Medical):   Physical Activity:   . Days of Exercise per Week:   . Minutes of Exercise per Session:   Stress:   . Feeling of Stress :   Social Connections:   . Frequency of Communication with Friends and Family:   . Frequency of Social Gatherings with Friends and Family:   . Attends Religious Services:   . Active Member of Clubs or Organizations:   . Attends Archivist Meetings:   [name] Marital Status:   Intimate Partner Violence:   . Fear of Current or Ex-Partner:   . Emotionally Abused:   [name] Physically Abused:   . Sexually Abused:     Past Surgical History:  Procedure Laterality Date  . ABDOMINAL AORTOGRAM W/LOWER EXTREMITY N/A 06/02/2019   Procedure: ABDOMINAL AORTOGRAM W/LOWER EXTREMITY;  Surgeon: Lorretta Harp, MD;  Location: [place] [facility];  Service: Cardiovascular;  Laterality: N/A;  . CARDIAC CATHETERIZATION  12/20/2016  .  CORONARY ANGIOPLASTY WITH STENT PLACEMENT  2004 X 2   "1st stent moved"  . CORONARY STENT INTERVENTION N/A 12/21/2016   Procedure: CORONARY STENT INTERVENTION;  Surgeon: Burnell Blanks, MD;  Location: [place] [facility];  Service: Cardiovascular;  Laterality: N/A;  . ESOPHAGOGASTRODUODENOSCOPY  ~ 2000  . LAPAROSCOPIC CHOLECYSTECTOMY    . LEFT HEART CATH AND CORONARY ANGIOGRAPHY N/A 12/20/2016   Procedure: LEFT HEART CATH AND CORONARY ANGIOGRAPHY;  Surgeon: Burnell Blanks, MD;  Location: [place] [facility];  Service: Cardiovascular;  Laterality: N/A;      Current Outpatient Medications:  .  albuterol (VENTOLIN HFA) 108 (90 Base) MCG/ACT inhaler, Inhale 2 puffs into the lungs every 6 (six) hours as needed for wheezing or shortness of breath., Disp: 18 g, Rfl: 5 .  allopurinol (ZYLOPRIM) 100 MG tablet, Take 1 tablet (100 mg total) by mouth 3 (three) times daily., Disp: 270 tablet, Rfl: 1 .  aspirin EC 81 MG tablet, Take 81 mg by mouth at bedtime. , Disp: , Rfl:  .  carvedilol (COREG) 25 MG tablet, Take 1 tablet (25 mg total) by mouth 2 (two) times daily., Disp: 180 tablet, Rfl: 1 .  cetirizine (ZYRTEC) 10 MG tablet, Take 1 tablet (10 mg total) by mouth daily as needed (for allergies.)., Disp: 90 tablet, Rfl: 3 .  clopidogrel (PLAVIX) 75 MG tablet, Take 1 tablet (75 mg total) by mouth at bedtime., Disp: 90 tablet, Rfl: 1 .  cyclobenzaprine (FLEXERIL) 10 MG tablet, Take 1 tablet (10 mg total) by mouth at bedtime. for muscle spams, Disp: 90 tablet, Rfl: 1 .  ergocalciferol (VITAMIN D2) 1.25 MG (50000 UT) capsule, Take 1 capsule (50,000 Units total) by mouth once a week., Disp: 12 capsule, Rfl: 3 .  ezetimibe (ZETIA) 10 MG tablet, Take 1 tablet (10 mg total) by mouth at bedtime., Disp: 90 tablet, Rfl: 1 .  HYDROcodone-acetaminophen (NORCO) 10-325 MG tablet, Take 1 tablet by mouth every 6 (six) hours as needed (pain.)., Disp: , Rfl:  .  isosorbide mononitrate (IMDUR) 60 MG 24 hr tablet,  TAKE ONE (1) TABLET EACH DAY, Disp: 90 tablet, Rfl: 1 .  liraglutide (VICTOZA) 18 MG/3ML SOPN, INJECT 0.3ML (1.8MG ) SQ DAILY, Disp: 27 mL, Rfl: 2 .  lisinopril (ZESTRIL) 5 MG tablet, Take 1 tablet (5 mg total) by mouth at bedtime., Disp: 90 tablet, Rfl: 1 .  Multiple Vitamin (MULTIVITAMIN WITH MINERALS) TABS tablet, Take 1 tablet by mouth at bedtime. Centrum Silver, Disp: , Rfl:  .  nitroGLYCERIN (NITROSTAT) 0.4 MG SL tablet, Place 1 tablet (0.4 mg total) under the tongue every 5 (five) minutes as needed for chest pain., Disp: 25 tablet, Rfl: 3 .  pantoprazole (PROTONIX) 40 MG tablet, Take 1 tablet (40 mg total) by mouth daily at 6 (six) AM., Disp: 90 tablet, Rfl: 1 .  pravastatin (PRAVACHOL) 80 MG tablet, Take 1 tablet (80 mg total) by mouth every evening., Disp: 90 tablet, Rfl: 1 .  pregabalin (LYRICA) 75 MG capsule, Take 1 capsule (75 mg total) by mouth 3 (three) times daily. New lower RENAL DOSING, Disp: 90 capsule, Rfl: 5 .  torsemide (DEMADEX) 20 MG tablet, Take 1 tablet (20 mg total) by mouth 2 (two) times daily., Disp: 180 tablet, Rfl: 1 .  ULTICARE MICRO PEN NEEDLES 32G X 4 MM MISC, EVERY DAY, Disp: 100 each, Rfl: 3    Physical Exam: Blood pressure 130/78, pulse 98, height 5\' 6"  (1.676 m), weight 175 lb (79.4 kg), SpO2 94 %.    Affect appropriate Chronically ill male  HEENT: normal Neck supple with no adenopathy JVP normal no bruits no thyromegaly Lungs clear with no wheezing and good diaphragmatic motion Heart:  S1/S2 no murmur, no rub, gallop or click PMI normal Abdomen: benighn, BS positve, no tenderness, no AAA no bruit.  No HSM or HJR Femoral bruits with palpable DP bilaterally  No edema Neuro non-focal Skin warm and dry No muscular weakness   Labs:   Lab Results  Component Value Date   WBC 7.5 09/11/2019   HGB 13.1 09/11/2019   HCT 40.9 09/11/2019   MCV 87 09/11/2019   PLT 230 09/11/2019   No results for input(s): NA, K, CL, CO2, BUN, CREATININE, CALCIUM,  PROT, BILITOT, ALKPHOS, ALT, AST, GLUCOSE in the last 168 hours.  Invalid input(s): LABALBU No results found for: CKTOTAL, CKMB, CKMBINDEX, TROPONINI  Lab Results  Component Value Date   CHOL 120 09/11/2019   CHOL 105 03/11/2019   CHOL 142 08/27/2018   Lab Results  Component Value Date   HDL 21 (L) 09/11/2019   HDL 25 (L) 03/11/2019   HDL 23 (L) 08/27/2018   Lab Results  Component Value Date   LDLCALC 63 09/11/2019   LDLCALC 54 03/11/2019   LDLCALC 90 08/27/2018   Lab Results  Component Value Date   TRIG 218 (H) 09/11/2019   TRIG 150 (H) 03/11/2019   TRIG 146 08/27/2018   Lab Results  Component Value Date   CHOLHDL 5.7 (H) 09/11/2019   CHOLHDL 4.2 03/11/2019   CHOLHDL 6.2 (H) 08/27/2018   No results found for: LDLDIRECT    Radiology: No results found.  EKG: SR PR 224 LAD normal ST segments    ASSESSMENT AND PLAN:   1. CAD:  Extensive with distant MI preserved EF Stents to proximal RCA/LAD and most recently stenting of the mid RCA March 2021 Residual distal RCA and circumflex disease with right to left collaterals to subtotal circumflex Would continue life long DAT  2. HLD:  On statin and zetia LDL at goal 63 labs done 09/11/19 3. HTN: Well controlled.  Continue current medications and low sodium Dash type diet.   4. PVD:  Post left iliac stenting done March 2021 with severe groin  pain since exam benign with some pain to palpation But no echymosis known bruit. Will check abdominal/pelvic CTA with run off and have him f/u with Dr Gwenlyn Found ? Complication such as wire dissection traversing from access site on right to left for intervention  5. DM: Discussed low carb diet.  Target hemoglobin A1c is 6.5 or less.  Continue current medications.  F/U with Dr Gwenlyn Found after CTA and me 6 months    Signed: Jenkins Rouge 11/04/2019, 10:56 AM

## 2019-10-23 ENCOUNTER — Ambulatory Visit (INDEPENDENT_AMBULATORY_CARE_PROVIDER_SITE_OTHER): Payer: Medicare PPO | Admitting: Family Medicine

## 2019-10-23 ENCOUNTER — Encounter: Payer: Self-pay | Admitting: Family Medicine

## 2019-10-23 ENCOUNTER — Other Ambulatory Visit: Payer: Self-pay

## 2019-10-23 VITALS — BP 113/64 | HR 88 | Temp 97.3°F | Ht 66.0 in | Wt 175.2 lb

## 2019-10-23 DIAGNOSIS — H0259 Other disorders affecting eyelid function: Secondary | ICD-10-CM

## 2019-10-23 DIAGNOSIS — Z95828 Presence of other vascular implants and grafts: Secondary | ICD-10-CM

## 2019-10-23 NOTE — Progress Notes (Signed)
Assessment & Plan:  1. Excessive involuntary blinking - Patient will keep appointment with neurology in September.   2. History of intravascular stent placement - Encouraged patient to discuss pain with cardiologist that inserted stent.    Return in about 3 months (around 01/23/2020) for follow-up of chronic medication conditions.  Hendricks Limes, MSN, APRN, FNP-C [facility]  Subjective:    Patient ID: Tommy Douglas, male    DOB: [birth year], 69 y.o.   MRN: [id number]  Patient Care Team: Loman Brooklyn, FNP as PCP - General (Family Medicine) Herminio Commons, MD (Inactive) as PCP - Cardiology (Cardiology) Herminio Commons, MD (Inactive) as Attending Physician (Cardiology) Lorretta Harp, MD as Consulting Physician (Cardiology) Fran Lowes, MD (Inactive) as Consulting Physician (Nephrology) Gala Romney Cristopher Estimable, MD as Consulting Physician (Gastroenterology) Lavera Guise, [facility] (Pharmacist)   Chief Complaint:  Chief Complaint  Patient presents with  . eye recheck    6 week re check.  Patient states that his eyes are still "twitching" but they are not as bad as they used to be.  States they have been doing this for years.  . Pelvic Pain    Patient states he had surgey x 9 weeks ago and still having pain.  . Anxiety    Patient states that the lexapro is giving him heart palpitations.    HPI: Tommy Douglas is a 69 y.o. male presenting on 10/23/2019 for eye recheck (6 week re check.  Patient states that his eyes are still "twitching" but they are not as bad as they used to be.  States they have been doing this for years.), Pelvic Pain (Patient states he had surgey x 9 weeks ago and still having pain.), and Anxiety (Patient states that the lexapro is giving him heart palpitations.)  Patient is here to follow-up on his eye twitching.  Approximately 6 weeks ago he was started on Lexapro 10 mg once daily to see if anxiety was potentially  causing his excessive involuntary blinking.  Patient reports he took the Lexapro for 1 week and stopped it due to palpitations.  At that time he was also referred to neurology and has an upcoming appointment scheduled for 11/27/2019.  Patient does not wish to try another medication until he sees neurology.  He does report that he went to the eye doctor approximately 3 weeks ago and got a new prescription for glasses but this has not made any difference in his excessive blinking.  Depression screen [name] 2/9 10/23/2019 09/11/2019 06/11/2019  Decreased Interest 0 0 0  Down, Depressed, Hopeless 0 0 0  PHQ - 2 Score 0 0 0  Altered sleeping 0 0 -  Tired, decreased energy 1 1 -  Change in appetite 0 0 -  Feeling bad or failure about yourself  0 0 -  Trouble concentrating 0 0 -  Moving slowly or fidgety/restless 0 0 -  Suicidal thoughts 0 0 -  PHQ-9 Score 1 1 -  Difficult doing work/chores - Not difficult at all -  Some recent data might be hidden   GAD 7 : Generalized Anxiety Score 10/23/2019 09/11/2019  Nervous, Anxious, on Edge 0 0  Control/stop worrying 0 0  Worry too much - different things 0 0  Trouble relaxing 0 0  Restless 0 0  Easily annoyed or irritable 0 0  Afraid - awful might happen 0 0  Total GAD 7 Score 0 0  Anxiety Difficulty - Not difficult at  all    New complaints: Patient also reports he is having worse pain in the left groin where he had a stent placed 9 weeks ago by Dr. Gwenlyn Found.  Social history:  Relevant past medical, surgical, family and social history reviewed and updated as indicated. Interim medical history since our last visit reviewed.  Allergies and medications reviewed and updated.  DATA REVIEWED: CHART IN EPIC  ROS: Negative unless specifically indicated above in HPI.    Current Outpatient Medications:  .  albuterol (VENTOLIN HFA) 108 (90 Base) MCG/ACT inhaler, Inhale 2 puffs into the lungs every 6 (six) hours as needed for wheezing or shortness of breath.,  Disp: 18 g, Rfl: 5 .  allopurinol (ZYLOPRIM) 100 MG tablet, Take 1 tablet (100 mg total) by mouth 3 (three) times daily., Disp: 270 tablet, Rfl: 1 .  aspirin EC 81 MG tablet, Take 81 mg by mouth at bedtime. , Disp: , Rfl:  .  carvedilol (COREG) 25 MG tablet, Take 1 tablet (25 mg total) by mouth 2 (two) times daily., Disp: 180 tablet, Rfl: 1 .  cetirizine (ZYRTEC) 10 MG tablet, Take 1 tablet (10 mg total) by mouth daily as needed (for allergies.)., Disp: 90 tablet, Rfl: 3 .  clopidogrel (PLAVIX) 75 MG tablet, Take 1 tablet (75 mg total) by mouth at bedtime., Disp: 90 tablet, Rfl: 1 .  cyclobenzaprine (FLEXERIL) 10 MG tablet, Take 1 tablet (10 mg total) by mouth at bedtime. for muscle spams, Disp: 90 tablet, Rfl: 1 .  ergocalciferol (VITAMIN D2) 1.25 MG (50000 UT) capsule, Take 1 capsule (50,000 Units total) by mouth once a week., Disp: 12 capsule, Rfl: 3 .  escitalopram (LEXAPRO) 10 MG tablet, Take 1 tablet (10 mg total) by mouth daily., Disp: 30 tablet, Rfl: 2 .  ezetimibe (ZETIA) 10 MG tablet, Take 1 tablet (10 mg total) by mouth at bedtime., Disp: 90 tablet, Rfl: 1 .  HYDROcodone-acetaminophen (NORCO) 10-325 MG tablet, Take 1 tablet by mouth every 6 (six) hours as needed (pain.)., Disp: , Rfl:  .  isosorbide mononitrate (IMDUR) 60 MG 24 hr tablet, TAKE ONE (1) TABLET EACH DAY, Disp: 90 tablet, Rfl: 1 .  liraglutide (VICTOZA) 18 MG/3ML SOPN, INJECT 0.3ML (1.8MG ) SQ DAILY, Disp: 27 mL, Rfl: 2 .  lisinopril (ZESTRIL) 5 MG tablet, Take 1 tablet (5 mg total) by mouth at bedtime., Disp: 90 tablet, Rfl: 1 .  Multiple Vitamin (MULTIVITAMIN WITH MINERALS) TABS tablet, Take 1 tablet by mouth at bedtime. Centrum Silver, Disp: , Rfl:  .  nitroGLYCERIN (NITROSTAT) 0.4 MG SL tablet, Place 1 tablet (0.4 mg total) under the tongue every 5 (five) minutes as needed for chest pain., Disp: 25 tablet, Rfl: 3 .  pantoprazole (PROTONIX) 40 MG tablet, Take 1 tablet (40 mg total) by mouth daily at 6 (six) AM., Disp: 90  tablet, Rfl: 1 .  pravastatin (PRAVACHOL) 80 MG tablet, Take 1 tablet (80 mg total) by mouth every evening., Disp: 90 tablet, Rfl: 1 .  pregabalin (LYRICA) 75 MG capsule, Take 1 capsule (75 mg total) by mouth 3 (three) times daily. New lower RENAL DOSING, Disp: 90 capsule, Rfl: 5 .  torsemide (DEMADEX) 20 MG tablet, Take 1 tablet (20 mg total) by mouth 2 (two) times daily., Disp: 180 tablet, Rfl: 1 .  ULTICARE MICRO PEN NEEDLES 32G X 4 MM MISC, EVERY DAY, Disp: 100 each, Rfl: 3   Allergies  Allergen Reactions  . Atorvastatin     All statins make him cough  .  Statins Cough    Pt is currently taking pravastatin   . Sulfa Antibiotics    Past Medical History:  Diagnosis Date  . Anxiety   . Arthritis    "qwhere" (12/20/2016)  . Chronic lower back pain   . CKD (chronic kidney disease), stage IV ([place])   . Complication of anesthesia    "got the wrong kind of anesthesia ~ 2000 when they were looking down into my stomach"  . Coronary artery disease   . Gout   . Heart disease   . Hyperlipidemia   . Hypertension   . Pneumonia ~ 2015  . Seasonal allergies   . Type II diabetes mellitus ([place])     Past Surgical History:  Procedure Laterality Date  . ABDOMINAL AORTOGRAM W/LOWER EXTREMITY N/A 06/02/2019   Procedure: ABDOMINAL AORTOGRAM W/LOWER EXTREMITY;  Surgeon: Lorretta Harp, MD;  Location: [place] [facility];  Service: Cardiovascular;  Laterality: N/A;  . CARDIAC CATHETERIZATION  12/20/2016  . CORONARY ANGIOPLASTY WITH STENT PLACEMENT  2004 X 2   "1st stent moved"  . CORONARY STENT INTERVENTION N/A 12/21/2016   Procedure: CORONARY STENT INTERVENTION;  Surgeon: Burnell Blanks, MD;  Location: [place] [facility];  Service: Cardiovascular;  Laterality: N/A;  . ESOPHAGOGASTRODUODENOSCOPY  ~ 2000  . LAPAROSCOPIC CHOLECYSTECTOMY    . LEFT HEART CATH AND CORONARY ANGIOGRAPHY N/A 12/20/2016   Procedure: LEFT HEART CATH AND CORONARY ANGIOGRAPHY;  Surgeon: Burnell Blanks, MD;   Location: [place] [facility];  Service: Cardiovascular;  Laterality: N/A;    Social History   Socioeconomic History  . Marital status: Widowed    Spouse name: Not on file  . Number of children: 7  . Years of education: Not on file  . Highest education level: High school graduate  Occupational History  . Occupation: retired     Comment: Designer, television/film set and Manufacturing engineer co.  Tobacco Use  . Smoking status: Current Every Day Smoker    Packs/day: 0.50    Years: 44.00    Pack years: 22.00    Types: Cigarettes  . Smokeless tobacco: Never Used  Vaping Use  . Vaping Use: Never used  Substance and Sexual Activity  . Alcohol use: No  . Drug use: No  . Sexual activity: Not on file  Other Topics Concern  . Not on file  Social History Narrative  . Not on file   Social Determinants of Health   Financial Resource Strain:   . Difficulty of Paying Living Expenses:   Food Insecurity:   . Worried About Charity fundraiser in the Last Year:   . Arboriculturist in the Last Year:   Transportation Needs:   . Film/video editor (Medical):   [name] Lack of Transportation (Non-Medical):   Physical Activity:   . Days of Exercise per Week:   . Minutes of Exercise per Session:   Stress:   . Feeling of Stress :   Social Connections:   . Frequency of Communication with Friends and Family:   . Frequency of Social Gatherings with Friends and Family:   . Attends Religious Services:   . Active Member of Clubs or Organizations:   . Attends Archivist Meetings:   [name] Marital Status:   Intimate Partner Violence:   . Fear of Current or Ex-Partner:   . Emotionally Abused:   [name] Physically Abused:   . Sexually Abused:         Objective:  BP (!) 113/64   Pulse 88   Temp (!) 97.3 F (36.3 C) (Temporal)   Ht 5\' 6"  (1.676 m)   Wt 175 lb 3.2 oz (79.5 kg)   SpO2 96%   BMI 28.28 kg/m   Wt Readings from Last 3 Encounters:  10/23/19 175 lb 3.2 oz (79.5 kg)  09/11/19 179 lb 9.6 oz  (81.5 kg)  06/17/19 177 lb (80.3 kg)    Physical Exam Vitals reviewed.  Constitutional:      General: He is not in acute distress.    Appearance: Normal appearance. He is overweight. He is not ill-appearing, toxic-appearing or diaphoretic.  HENT:     Head: Normocephalic and atraumatic.  Eyes:     General: No scleral icterus.       Right eye: No discharge.        Left eye: No discharge.     Conjunctiva/sclera: Conjunctivae normal.  Cardiovascular:     Rate and Rhythm: Normal rate and regular rhythm.     Heart sounds: Normal heart sounds. No murmur heard.  No friction rub. No gallop.   Pulmonary:     Effort: Pulmonary effort is normal. No respiratory distress.     Breath sounds: Normal breath sounds. No stridor. No wheezing, rhonchi or rales.  Musculoskeletal:        General: Normal range of motion.     Cervical back: Normal range of motion.  Skin:    General: Skin is warm and dry.  Neurological:     Mental Status: He is alert and oriented to person, place, and time. Mental status is at baseline.  Psychiatric:        Mood and Affect: Mood normal.        Behavior: Behavior normal.        Thought Content: Thought content normal.        Judgment: Judgment normal.     Lab Results  Component Value Date   TSH 1.720 03/11/2019   Lab Results  Component Value Date   WBC 7.5 09/11/2019   HGB 13.1 09/11/2019   HCT 40.9 09/11/2019   MCV 87 09/11/2019   PLT 230 09/11/2019   Lab Results  Component Value Date   NA 139 09/11/2019   K 4.2 09/11/2019   CO2 25 09/11/2019   GLUCOSE 208 (H) 09/11/2019   BUN 21 09/11/2019   CREATININE 1.67 (H) 09/11/2019   BILITOT 0.3 09/11/2019   ALKPHOS 109 09/11/2019   AST 29 09/11/2019   ALT 16 09/11/2019   PROT 7.1 09/11/2019   ALBUMIN 4.0 09/11/2019   CALCIUM 9.3 09/11/2019   ANIONGAP 8 12/22/2016   Lab Results  Component Value Date   CHOL 120 09/11/2019   Lab Results  Component Value Date   HDL 21 (L) 09/11/2019   Lab Results   Component Value Date   LDLCALC 63 09/11/2019   Lab Results  Component Value Date   TRIG 218 (H) 09/11/2019   Lab Results  Component Value Date   CHOLHDL 5.7 (H) 09/11/2019   Lab Results  Component Value Date   HGBA1C 6.3 09/11/2019

## 2019-10-27 ENCOUNTER — Encounter: Payer: Self-pay | Admitting: Family Medicine

## 2019-10-30 ENCOUNTER — Telehealth: Payer: Medicare Other | Admitting: Cardiovascular Disease

## 2019-11-04 ENCOUNTER — Other Ambulatory Visit: Payer: Self-pay

## 2019-11-04 ENCOUNTER — Encounter: Payer: Self-pay | Admitting: Cardiovascular Disease

## 2019-11-04 ENCOUNTER — Other Ambulatory Visit (HOSPITAL_COMMUNITY)
Admission: RE | Admit: 2019-11-04 | Discharge: 2019-11-04 | Disposition: A | Discharge status: Home / Self Care | Payer: Medicare PPO | Source: Ambulatory Visit | Attending: Cardiovascular Disease | Admitting: Cardiovascular Disease

## 2019-11-04 ENCOUNTER — Ambulatory Visit: Payer: Medicare PPO | Admitting: Cardiovascular Disease

## 2019-11-04 ENCOUNTER — Ambulatory Visit (HOSPITAL_COMMUNITY)
Admission: RE | Admit: 2019-11-04 | Discharge: 2019-11-04 | Disposition: A | Discharge status: Home / Self Care | Payer: Medicare PPO | Source: Ambulatory Visit | Attending: Cardiovascular Disease | Admitting: Cardiovascular Disease

## 2019-11-04 VITALS — BP 130/78 | HR 98 | Ht 66.0 in | Wt 175.0 lb

## 2019-11-04 DIAGNOSIS — I739 Peripheral vascular disease, unspecified: Secondary | ICD-10-CM | POA: Diagnosis not present

## 2019-11-04 DIAGNOSIS — I2510 Atherosclerotic heart disease of native coronary artery without angina pectoris: Secondary | ICD-10-CM | POA: Diagnosis not present

## 2019-11-04 DIAGNOSIS — R1030 Lower abdominal pain, unspecified: Secondary | ICD-10-CM

## 2019-11-04 LAB — BASIC METABOLIC PANEL
Anion gap: 10 (ref 5–15)
BUN: 25 mg/dL — ABNORMAL HIGH (ref 8–23)
CO2: 25 mmol/L (ref 22–32)
Calcium: 9 mg/dL (ref 8.9–10.3)
Chloride: 102 mmol/L (ref 98–111)
Creatinine, Ser: 1.97 mg/dL — ABNORMAL HIGH (ref 0.61–1.24)
GFR calc Af Amer: 39 mL/min — ABNORMAL LOW (ref >60)
GFR calc non Af Amer: 34 mL/min — ABNORMAL LOW (ref >60)
Glucose, Bld: 184 mg/dL — ABNORMAL HIGH (ref 70–99)
Potassium: 4.1 mmol/L (ref 3.5–5.1)
Sodium: 137 mmol/L (ref 135–145)

## 2019-11-04 MED ORDER — IOHEXOL 350 MG/ML SOLN: 100.0000 mL | Freq: Once | INTRAVENOUS | Status: DC | PRN

## 2019-11-04 NOTE — Patient Instructions (Signed)
Medication Instructions:  Your physician recommends that you continue on your current medications as directed. Please refer to the Current Medication list given to you today.  *If you need a refill on your cardiac medications before your next appointment, please call your pharmacy*   Lab Work: BMET STAT today If you have labs (blood work) drawn today and your tests are completely normal, you will receive your results only by: [name] MyChart Message (if you have MyChart) OR . A paper copy in the mail If you have any lab test that is abnormal or we need to change your treatment, we will call you to review the results.   Testing/Procedures: Get abdominal CT TODAY at [place]: At [facility], you and your health needs are our priority.  As part of our continuing mission to provide you with exceptional heart care, we have created designated Provider Care Teams.  These Care Teams include your primary Cardiologist (physician) and Advanced Practice Providers (APPs -  Physician Assistants and Nurse Practitioners) who all work together to provide you with the care you need, when you need it.  We recommend signing up for the patient portal called "MyChart".  Sign up information is provided on this After Visit Summary.  MyChart is used to connect with patients for Virtual Visits (Telemedicine).  Patients are able to view lab/test results, encounter notes, upcoming appointments, etc.  Non-urgent messages can be sent to your provider as well.   To learn more about what you can do with MyChart, go to [email or web address].    Your next appointment:   2 week(s)  The format for your next appointment:   In Person  Provider:   Carlyle Dolly, MD   Other Instructions None      Thank you for choosing [facility] !

## 2019-11-14 DIAGNOSIS — Z79899 Other long term (current) drug therapy: Secondary | ICD-10-CM | POA: Diagnosis not present

## 2019-11-14 DIAGNOSIS — N183 Chronic kidney disease, stage 3 unspecified: Secondary | ICD-10-CM | POA: Diagnosis not present

## 2019-11-14 DIAGNOSIS — M79605 Pain in left leg: Secondary | ICD-10-CM | POA: Diagnosis not present

## 2019-11-14 DIAGNOSIS — F17210 Nicotine dependence, cigarettes, uncomplicated: Secondary | ICD-10-CM | POA: Diagnosis not present

## 2019-11-14 DIAGNOSIS — R809 Proteinuria, unspecified: Secondary | ICD-10-CM | POA: Diagnosis not present

## 2019-11-14 DIAGNOSIS — M5136 Other intervertebral disc degeneration, lumbar region: Secondary | ICD-10-CM | POA: Diagnosis not present

## 2019-11-17 ENCOUNTER — Telehealth: Payer: Self-pay | Admitting: Cardiovascular Disease

## 2019-11-17 NOTE — Telephone Encounter (Signed)
Returned call to pt. No answer. Left msg to call back.  

## 2019-11-17 NOTE — Telephone Encounter (Signed)
Pt is requesting labwork  Order for CT   Please call [phone]   Thanks renee FYI Dr.Nishan set up work-in CT here at [facility] for Pt at last visit.  Pt left w/out having CT done.

## 2019-11-18 DIAGNOSIS — R042 Hemoptysis: Secondary | ICD-10-CM | POA: Diagnosis not present

## 2019-11-26 DIAGNOSIS — R918 Other nonspecific abnormal finding of lung field: Secondary | ICD-10-CM | POA: Diagnosis not present

## 2019-11-26 DIAGNOSIS — R9389 Abnormal findings on diagnostic imaging of other specified body structures: Secondary | ICD-10-CM | POA: Diagnosis not present

## 2019-11-26 DIAGNOSIS — R0602 Shortness of breath: Secondary | ICD-10-CM | POA: Diagnosis not present

## 2019-11-26 DIAGNOSIS — R042 Hemoptysis: Secondary | ICD-10-CM | POA: Diagnosis not present

## 2019-11-27 ENCOUNTER — Ambulatory Visit: Payer: Medicare Other | Admitting: Neurology

## 2019-12-08 DIAGNOSIS — R918 Other nonspecific abnormal finding of lung field: Secondary | ICD-10-CM | POA: Diagnosis not present

## 2019-12-09 ENCOUNTER — Other Ambulatory Visit: Payer: Self-pay

## 2019-12-09 ENCOUNTER — Ambulatory Visit: Payer: Medicare PPO | Admitting: Cardiovascular Disease

## 2019-12-09 ENCOUNTER — Encounter: Payer: Self-pay | Admitting: Cardiovascular Disease

## 2019-12-09 VITALS — BP 140/72 | HR 93 | Ht 66.0 in | Wt 177.6 lb

## 2019-12-09 DIAGNOSIS — I739 Peripheral vascular disease, unspecified: Secondary | ICD-10-CM | POA: Diagnosis not present

## 2019-12-09 NOTE — Patient Instructions (Signed)
Medication Instructions:  Your physician recommends that you continue on your current medications as directed. Please refer to the Current Medication list given to you today.  Testing/Procedures: Your physician has requested that you have an aorto-iliac duplex. During this test, an ultrasound is used to evaluate the aorta and iliac arteries. Do not eat after midnight the day before and avoid carbonated beverages  Your physician has requested that you have a lower extremity arterial duplex. This test is an ultrasound of the arteries in the legs. It looks at arterial blood flow in the legs. Allow one hour for Lower Arterial scans. There are no restrictions or special instructions  Follow-Up: At [facility], you and your health needs are our priority.  As part of our continuing mission to provide you with exceptional heart care, we have created designated Provider Care Teams.  These Care Teams include your primary Cardiologist (physician) and Advanced Practice Providers (APPs -  Physician Assistants and Nurse Practitioners) who all work together to provide you with the care you need, when you need it.  We recommend signing up for the patient portal called "MyChart".  Sign up information is provided on this After Visit Summary.  MyChart is used to connect with patients for Virtual Visits (Telemedicine).  Patients are able to view lab/test results, encounter notes, upcoming appointments, etc.  Non-urgent messages can be sent to your provider as well.   To learn more about what you can do with MyChart, go to [email or web address].    Your next appointment:   6 month(s)  The format for your next appointment:   In Person  Provider:   Quay Burow, MD

## 2019-12-09 NOTE — Assessment & Plan Note (Addendum)
Mr. Cuppett has history of PAD status post left common and external iliac artery PTA and stenting using a nitinol self-expanding stent by myself 06/02/2019.  In addition he had a 75% ostial right SFA stenosis.  When I saw him back 2 weeks after his procedure 06/17/2019 his claudication had resolved and his Dopplers had normalized.  He said 5 to 6 weeks after the procedure he developed pain in his left groin which is worse when he stands up and ambulates.  It is different than his claudication pain.  There is no pain on palpation.  He does not have a bruit.  I doubt whether this is vascular in nature.  I am going to get lower extremity arterial Doppler studies to further evaluate.  His procedure was done from the right common femoral access site and I never instrumented the left common femoral artery.

## 2019-12-09 NOTE — Progress Notes (Signed)
12/09/2019 [name]   1950-07-27  [id number]  Primary Physician Loman Brooklyn, FNP Primary Cardiologist: Lorretta Harp MD Tommy Douglas, [place], [place]  HPI:  Tommy Douglas is a 69 y.o.  divorced African-American male father of 50, grandfather of 58 grandchildren.Tommy Douglas He was referred by [facility] peripheral vascular evaluation because of lifestyle limiting claudication.I last saw him in the office  06/17/2019.  He currently sees Dr. Johnsie Cancel. His cardiac risk factors are notable for ongoing tobacco abuse one pack per day for the last 45 years, treated hypertension, diabetes and hyperlipidemia. He had a myocardial infarction 15 years ago but has never had a stroke. He recently had circumflex intervention by Dr. Nile Dear days ago. He currently denies chest pain or shortness of breath. He does complain of left lower extremity claudication which is lifestyle limiting a recent Doppler in our office performed 11/30/16 revealed high-frequency signal in his left external iliac artery with a left ABI 0.89.  We were planning on performing angiography and intervention but unfortunately his girlfriend had a stroke and he needed to take care of her and therefore put off his intervention. Now he is back with worsening claudication and wishes to proceed with angiography and intervention.  I performed peripheral angiography on him 06/02/2019 placing a 9 mm x 80 mm long Abbott absolute Pro nitinol self-expanding stent in his left common and extra iliac artery.  An excellent angiographic result.  He did have a 75% proximal right SFA stenosis.  His claudication has complete resolved and his Dopplers have normalized.  Since I saw him 6 months ago he did develop excruciating pain in his left groin approximately 5 to 6 weeks post procedure.  It is worse when he stands up and ambulates.  Is different than his claudication pain.  He does not have pain on palpation nor does he have a bruit.     Current Meds  Medication Sig  . albuterol (VENTOLIN HFA) 108 (90 Base) MCG/ACT inhaler Inhale 2 puffs into the lungs every 6 (six) hours as needed for wheezing or shortness of breath.  . allopurinol (ZYLOPRIM) 100 MG tablet Take 1 tablet (100 mg total) by mouth 3 (three) times daily.  Tommy Douglas amoxicillin-clavulanate (AUGMENTIN) 875-125 MG tablet   . aspirin EC 81 MG tablet Take 81 mg by mouth at bedtime.   . carvedilol (COREG) 25 MG tablet Take 1 tablet (25 mg total) by mouth 2 (two) times daily.  . cetirizine (ZYRTEC) 10 MG tablet Take 1 tablet (10 mg total) by mouth daily as needed (for allergies.).  Tommy Douglas clopidogrel (PLAVIX) 75 MG tablet Take 1 tablet (75 mg total) by mouth at bedtime.  . cyclobenzaprine (FLEXERIL) 10 MG tablet Take 1 tablet (10 mg total) by mouth at bedtime. for muscle spams  . ergocalciferol (VITAMIN D2) 1.25 MG (50000 UT) capsule Take 1 capsule (50,000 Units total) by mouth once a week.  . ezetimibe (ZETIA) 10 MG tablet Take 1 tablet (10 mg total) by mouth at bedtime.  Tommy Douglas HYDROcodone-acetaminophen (NORCO) 10-325 MG tablet Take 1 tablet by mouth every 6 (six) hours as needed (pain.).  Tommy Douglas isosorbide mononitrate (IMDUR) 60 MG 24 hr tablet TAKE ONE (1) TABLET EACH DAY  . liraglutide (VICTOZA) 18 MG/3ML SOPN INJECT 0.3ML (1.8MG ) SQ DAILY  . lisinopril (ZESTRIL) 5 MG tablet Take 1 tablet (5 mg total) by mouth at bedtime.  . Multiple Vitamin (MULTIVITAMIN WITH MINERALS) TABS tablet Take 1 tablet by mouth at bedtime. Centrum Silver  .  nitroGLYCERIN (NITROSTAT) 0.4 MG SL tablet Place 1 tablet (0.4 mg total) under the tongue every 5 (five) minutes as needed for chest pain.  . pantoprazole (PROTONIX) 40 MG tablet Take 1 tablet (40 mg total) by mouth daily at 6 (six) AM.  . pravastatin (PRAVACHOL) 80 MG tablet Take 1 tablet (80 mg total) by mouth every evening.  . pregabalin (LYRICA) 75 MG capsule Take 1 capsule (75 mg total) by mouth 3 (three) times daily. New lower RENAL DOSING  .  torsemide (DEMADEX) 20 MG tablet Take 1 tablet (20 mg total) by mouth 2 (two) times daily.  Tommy Douglas ULTICARE MICRO PEN NEEDLES 32G X 4 MM MISC EVERY DAY     Allergies  Allergen Reactions  . Atorvastatin     All statins make him cough  . Statins Cough    Pt is currently taking pravastatin   . Sulfa Antibiotics   . Lexapro [Escitalopram] Palpitations    Social History   Socioeconomic History  . Marital status: Widowed    Spouse name: Not on file  . Number of children: 7  . Years of education: Not on file  . Highest education level: High school graduate  Occupational History  . Occupation: retired     Comment: Designer, television/film set and Manufacturing engineer co.  Tobacco Use  . Smoking status: Current Every Day Smoker    Packs/day: 0.50    Years: 44.00    Pack years: 22.00    Types: Cigarettes  . Smokeless tobacco: Never Used  Vaping Use  . Vaping Use: Never used  Substance and Sexual Activity  . Alcohol use: No  . Drug use: No  . Sexual activity: Not on file  Other Topics Concern  . Not on file  Social History Narrative  . Not on file   Social Determinants of Health   Financial Resource Strain:   . Difficulty of Paying Living Expenses: Not on file  Food Insecurity:   . Worried About Charity fundraiser in the Last Year: Not on file  . Ran Out of Food in the Last Year: Not on file  Transportation Needs:   . Lack of Transportation (Medical): Not on file  . Lack of Transportation (Non-Medical): Not on file  Physical Activity:   . Days of Exercise per Week: Not on file  . Minutes of Exercise per Session: Not on file  Stress:   . Feeling of Stress : Not on file  Social Connections:   . Frequency of Communication with Friends and Family: Not on file  . Frequency of Social Gatherings with Friends and Family: Not on file  . Attends Religious Services: Not on file  . Active Member of Clubs or Organizations: Not on file  . Attends Archivist Meetings: Not on file  .  Marital Status: Not on file  Intimate Partner Violence:   . Fear of Current or Ex-Partner: Not on file  . Emotionally Abused: Not on file  . Physically Abused: Not on file  . Sexually Abused: Not on file     Review of Systems: General: negative for chills, fever, night sweats or weight changes.  Cardiovascular: negative for chest pain, dyspnea on exertion, edema, orthopnea, palpitations, paroxysmal nocturnal dyspnea or shortness of breath Dermatological: negative for rash Respiratory: negative for cough or wheezing Urologic: negative for hematuria Abdominal: negative for nausea, vomiting, diarrhea, bright red blood per rectum, melena, or hematemesis Neurologic: negative for visual changes, syncope, or dizziness All other systems reviewed and  are otherwise negative except as noted above.    Blood pressure 140/72, pulse 93, height 5\' 6"  (1.676 m), weight 177 lb 9.6 oz (80.6 kg), SpO2 94 %.  General appearance: alert and no distress Neck: no adenopathy, no carotid bruit, no JVD, supple, symmetrical, trachea midline and thyroid not enlarged, symmetric, no tenderness/mass/nodules Lungs: clear to auscultation bilaterally Heart: regular rate and rhythm, S1, S2 normal, no murmur, click, rub or gallop Extremities: extremities normal, atraumatic, no cyanosis or edema Pulses: 2+ and symmetric Skin: Skin color, texture, turgor normal. No rashes or lesions Neurologic: Alert and oriented X 3, normal strength and tone. Normal symmetric reflexes. Normal coordination and gait  EKG not performed today  ASSESSMENT AND PLAN:   PAD (peripheral artery disease) [facility]) Mr. Anderle has history of PAD status post left common and external iliac artery PTA and stenting using a nitinol self-expanding stent by myself 06/02/2019.  In addition he had a 75% ostial right SFA stenosis.  When I saw him back 2 weeks after his procedure 06/17/2019 his claudication had resolved and his Dopplers had normalized.  He said 5  to 6 weeks after the procedure he developed pain in his left groin which is worse when he stands up and ambulates.  It is different than his claudication pain.  There is no pain on palpation.  He does not have a bruit.  I doubt whether this is vascular in nature.  I am going to get lower extremity arterial Doppler studies to further evaluate.  His procedure was done from the right common femoral access site and I never instrumented the left common femoral artery.      Lorretta Harp MD FACP,FACC,FAHA, [facility] 12/09/2019 9:34 AM

## 2019-12-12 ENCOUNTER — Ambulatory Visit: Payer: Medicare Other | Admitting: Family Medicine

## 2019-12-15 DIAGNOSIS — I739 Peripheral vascular disease, unspecified: Secondary | ICD-10-CM | POA: Diagnosis not present

## 2019-12-15 DIAGNOSIS — I2510 Atherosclerotic heart disease of native coronary artery without angina pectoris: Secondary | ICD-10-CM | POA: Diagnosis not present

## 2019-12-15 DIAGNOSIS — R7309 Other abnormal glucose: Secondary | ICD-10-CM | POA: Diagnosis not present

## 2019-12-15 DIAGNOSIS — I700 Atherosclerosis of aorta: Secondary | ICD-10-CM | POA: Diagnosis not present

## 2019-12-15 DIAGNOSIS — R918 Other nonspecific abnormal finding of lung field: Secondary | ICD-10-CM | POA: Diagnosis not present

## 2019-12-15 DIAGNOSIS — R911 Solitary pulmonary nodule: Secondary | ICD-10-CM | POA: Diagnosis not present

## 2019-12-16 DIAGNOSIS — Z79899 Other long term (current) drug therapy: Secondary | ICD-10-CM | POA: Diagnosis not present

## 2019-12-16 DIAGNOSIS — R809 Proteinuria, unspecified: Secondary | ICD-10-CM | POA: Diagnosis not present

## 2019-12-16 DIAGNOSIS — N183 Chronic kidney disease, stage 3 unspecified: Secondary | ICD-10-CM | POA: Diagnosis not present

## 2019-12-16 DIAGNOSIS — M79605 Pain in left leg: Secondary | ICD-10-CM | POA: Diagnosis not present

## 2019-12-16 DIAGNOSIS — F17210 Nicotine dependence, cigarettes, uncomplicated: Secondary | ICD-10-CM | POA: Diagnosis not present

## 2019-12-16 DIAGNOSIS — M5136 Other intervertebral disc degeneration, lumbar region: Secondary | ICD-10-CM | POA: Diagnosis not present

## 2019-12-17 DIAGNOSIS — R918 Other nonspecific abnormal finding of lung field: Secondary | ICD-10-CM | POA: Diagnosis not present

## 2020-01-01 ENCOUNTER — Other Ambulatory Visit: Payer: Self-pay

## 2020-01-01 ENCOUNTER — Ambulatory Visit (HOSPITAL_BASED_OUTPATIENT_CLINIC_OR_DEPARTMENT_OTHER)
Admission: RE | Admit: 2020-01-01 | Discharge: 2020-01-01 | Disposition: A | Discharge status: Home / Self Care | Payer: Medicare PPO | Source: Ambulatory Visit | Attending: Cardiology | Admitting: Cardiology

## 2020-01-01 ENCOUNTER — Other Ambulatory Visit: Payer: Self-pay | Admitting: Cardiovascular Disease

## 2020-01-01 ENCOUNTER — Other Ambulatory Visit (HOSPITAL_COMMUNITY): Payer: Self-pay | Admitting: Cardiovascular Disease

## 2020-01-01 ENCOUNTER — Ambulatory Visit (HOSPITAL_COMMUNITY)
Admission: RE | Admit: 2020-01-01 | Discharge: 2020-01-01 | Disposition: A | Discharge status: Home / Self Care | Payer: Medicare PPO | Source: Ambulatory Visit | Attending: Cardiology | Admitting: Cardiology

## 2020-01-01 DIAGNOSIS — I739 Peripheral vascular disease, unspecified: Secondary | ICD-10-CM

## 2020-01-01 DIAGNOSIS — M79605 Pain in left leg: Secondary | ICD-10-CM | POA: Diagnosis not present

## 2020-01-01 DIAGNOSIS — Z95828 Presence of other vascular implants and grafts: Secondary | ICD-10-CM

## 2020-01-07 DIAGNOSIS — F17210 Nicotine dependence, cigarettes, uncomplicated: Secondary | ICD-10-CM | POA: Diagnosis not present

## 2020-01-07 DIAGNOSIS — M79605 Pain in left leg: Secondary | ICD-10-CM | POA: Diagnosis not present

## 2020-01-07 DIAGNOSIS — N183 Chronic kidney disease, stage 3 unspecified: Secondary | ICD-10-CM | POA: Diagnosis not present

## 2020-01-07 DIAGNOSIS — M5136 Other intervertebral disc degeneration, lumbar region: Secondary | ICD-10-CM | POA: Diagnosis not present

## 2020-01-07 DIAGNOSIS — Z79899 Other long term (current) drug therapy: Secondary | ICD-10-CM | POA: Diagnosis not present

## 2020-01-13 ENCOUNTER — Ambulatory Visit: Payer: Medicare PPO | Admitting: Family Medicine

## 2020-01-16 ENCOUNTER — Other Ambulatory Visit: Payer: Self-pay

## 2020-01-16 ENCOUNTER — Encounter: Payer: Self-pay | Admitting: Cardiology

## 2020-01-16 ENCOUNTER — Ambulatory Visit: Payer: Medicare PPO | Admitting: Cardiology

## 2020-01-16 VITALS — BP 172/80 | HR 89 | Resp 16 | Ht 66.0 in | Wt 174.1 lb

## 2020-01-16 DIAGNOSIS — I739 Peripheral vascular disease, unspecified: Secondary | ICD-10-CM

## 2020-01-16 DIAGNOSIS — I2510 Atherosclerotic heart disease of native coronary artery without angina pectoris: Secondary | ICD-10-CM

## 2020-01-16 DIAGNOSIS — E782 Mixed hyperlipidemia: Secondary | ICD-10-CM

## 2020-01-16 DIAGNOSIS — I10 Essential (primary) hypertension: Secondary | ICD-10-CM | POA: Diagnosis not present

## 2020-01-16 NOTE — Patient Instructions (Addendum)
Medication Instructions:  Your physician recommends that you continue on your current medications as directed. Please refer to the Current Medication list given to you today.  *If you need a refill on your cardiac medications before your next appointment, please call your pharmacy*   Lab Work: None today If you have labs (blood work) drawn today and your tests are completely normal, you will receive your results only by: . MyChart Message (if you have MyChart) OR . A paper copy in the mail If you have any lab test that is abnormal or we need to change your treatment, we will call you to review the results.   Testing/Procedures: None today   Follow-Up: At [facility], you and your health needs are our priority.  As part of our continuing mission to provide you with exceptional heart care, we have created designated Provider Care Teams.  These Care Teams include your primary Cardiologist (physician) and Advanced Practice Providers (APPs -  Physician Assistants and Nurse Practitioners) who all work together to provide you with the care you need, when you need it.  We recommend signing up for the patient portal called "MyChart".  Sign up information is provided on this After Visit Summary.  MyChart is used to connect with patients for Virtual Visits (Telemedicine).  Patients are able to view lab/test results, encounter notes, upcoming appointments, etc.  Non-urgent messages can be sent to your provider as well.   To learn more about what you can do with MyChart, go to [email or web address].    Your next appointment:   6 month(s)  The format for your next appointment:   In Person  Provider:   Jonathan Branch, MD   Other Instructions None       Thank you for choosing [facility] !         

## 2020-01-16 NOTE — Progress Notes (Signed)
Clinical Summary Mr. Endicott is a 69 y.o.male seen today for follow up of the following medical problems.   1. CAD - history of prior LAD and RCA stenting.  - last intervention 11/2016 with DES to mid RCA, small vessel residual disease PDA and PL branches too small for intervention. Subtotal LCX with collaterals  - no recent chest pain -no SOB/DOE - compliant with meds  2. PAD - followed by Dr Gwenlyn Found - prior left exertnal iliac stent.   12/2019 LE arterial US: 30-49% mid and distal SFA. Patent left prox mid exteranl iliac stent.  12/2019 ABI: 0.9 Left 1.03 though noncompressible - some recent ongoing left groin pain   3. Hyperlipidemia - 08/2019 TC 120 TG 218 HDL 21 LDL 63  4. Chronic diastolic HF - no recent edema.   5. CKD 3 - Cr has been stable   6. HTN - home bp's 120-130s/80s - has not taken meds yet today.    Has had moderna vaccine x 2,    Past Medical History:  Diagnosis Date  . Anxiety   . Arthritis    "qwhere" (12/20/2016)  . Chronic lower back pain   . CKD (chronic kidney disease), stage IV ([place])   . Complication of anesthesia    "got the wrong kind of anesthesia ~ 2000 when they were looking down into my stomach"  . Coronary artery disease   . Gout   . Heart disease   . Hyperlipidemia   . Hypertension   . Pneumonia ~ 2015  . Seasonal allergies   . Type II diabetes mellitus (H[facility])      Allergies  Allergen Reactions  . Atorvastatin     All statins make him cough  . Statins Cough    Pt is currently taking pravastatin   . Sulfa Antibiotics   . Lexapro [Escitalopram] Palpitations     Current Outpatient Medications  Medication Sig Dispense Refill  . albuterol (VENTOLIN HFA) 108 (90 Base) MCG/ACT inhaler Inhale 2 puffs into the lungs every 6 (six) hours as needed for wheezing or shortness of breath. 18 g 5  . allopurinol (ZYLOPRIM) 100 MG tablet Take 1 tablet (100 mg total) by mouth 3 (three) times daily. 270 tablet 1  .  amoxicillin-clavulanate (AUGMENTIN) 875-125 MG tablet     . aspirin EC 81 MG tablet Take 81 mg by mouth at bedtime.     . carvedilol (COREG) 25 MG tablet Take 1 tablet (25 mg total) by mouth 2 (two) times daily. 180 tablet 1  . cetirizine (ZYRTEC) 10 MG tablet Take 1 tablet (10 mg total) by mouth daily as needed (for allergies.). 90 tablet 3  . clopidogrel (PLAVIX) 75 MG tablet Take 1 tablet (75 mg total) by mouth at bedtime. 90 tablet 1  . cyclobenzaprine (FLEXERIL) 10 MG tablet Take 1 tablet (10 mg total) by mouth at bedtime. for muscle spams 90 tablet 1  . ergocalciferol (VITAMIN D2) 1.25 MG (50000 UT) capsule Take 1 capsule (50,000 Units total) by mouth once a week. 12 capsule 3  . ezetimibe (ZETIA) 10 MG tablet Take 1 tablet (10 mg total) by mouth at bedtime. 90 tablet 1  . HYDROcodone-acetaminophen (NORCO) 10-325 MG tablet Take 1 tablet by mouth every 6 (six) hours as needed (pain.).    [name] isosorbide mononitrate (IMDUR) 60 MG 24 hr tablet TAKE ONE (1) TABLET EACH DAY 90 tablet 1  . liraglutide (VICTOZA) 18 MG/3ML SOPN INJECT 0.3ML (1.8MG ) SQ DAILY 27 mL  2  . lisinopril (ZESTRIL) 5 MG tablet Take 1 tablet (5 mg total) by mouth at bedtime. 90 tablet 1  . Multiple Vitamin (MULTIVITAMIN WITH MINERALS) TABS tablet Take 1 tablet by mouth at bedtime. Centrum Silver    . nitroGLYCERIN (NITROSTAT) 0.4 MG SL tablet Place 1 tablet (0.4 mg total) under the tongue every 5 (five) minutes as needed for chest pain. 25 tablet 3  . pantoprazole (PROTONIX) 40 MG tablet Take 1 tablet (40 mg total) by mouth daily at 6 (six) AM. 90 tablet 1  . pravastatin (PRAVACHOL) 80 MG tablet Take 1 tablet (80 mg total) by mouth every evening. 90 tablet 1  . pregabalin (LYRICA) 75 MG capsule Take 1 capsule (75 mg total) by mouth 3 (three) times daily. New lower RENAL DOSING 90 capsule 5  . torsemide (DEMADEX) 20 MG tablet Take 1 tablet (20 mg total) by mouth 2 (two) times daily. 180 tablet 1  . ULTICARE MICRO PEN NEEDLES 32G X  4 MM MISC EVERY DAY 100 each 3   No current facility-administered medications for this visit.     Past Surgical History:  Procedure Laterality Date  . ABDOMINAL AORTOGRAM W/LOWER EXTREMITY N/A 06/02/2019   Procedure: ABDOMINAL AORTOGRAM W/LOWER EXTREMITY;  Surgeon: Lorretta Harp, MD;  Location: [facility];  Service: Cardiovascular;  Laterality: N/A;  . CARDIAC CATHETERIZATION  12/20/2016  . CORONARY ANGIOPLASTY WITH STENT PLACEMENT  2004 X 2   "1st stent moved"  . CORONARY STENT INTERVENTION N/A 12/21/2016   Procedure: CORONARY STENT INTERVENTION;  Surgeon: Burnell Blanks, MD;  Location: [facility];  Service: Cardiovascular;  Laterality: N/A;  . ESOPHAGOGASTRODUODENOSCOPY  ~ 2000  . LAPAROSCOPIC CHOLECYSTECTOMY    . LEFT HEART CATH AND CORONARY ANGIOGRAPHY N/A 12/20/2016   Procedure: LEFT HEART CATH AND CORONARY ANGIOGRAPHY;  Surgeon: Burnell Blanks, MD;  Location: [facility];  Service: Cardiovascular;  Laterality: N/A;     Allergies  Allergen Reactions  . Atorvastatin     All statins make him cough  . Statins Cough    Pt is currently taking pravastatin   . Sulfa Antibiotics   . Lexapro [Escitalopram] Palpitations      Family History  Problem Relation Age of Onset  . Cancer Mother        male  . Diabetes Mother   . Stroke Father        x3  . Stroke Brother        x2   . Other Sister        bowel necrosis?   . Other Daughter        gout  . Other Son        gout  . Alcohol abuse Brother   . Other Sister        pneumonia  . Other Sister        pneumonia  . Cancer Daughter        male  . Other Daughter        fluid on brain - disabled      Social History Mr. Deshmukh reports that he has been smoking cigarettes. He has a 22.00 pack-year smoking history. He has never used smokeless tobacco. Mr. Mccaughey reports no history of alcohol use.   Review of Systems CONSTITUTIONAL: No weight loss, fever, chills,  weakness or fatigue.  HEENT: Eyes: No visual loss, blurred vision, double vision or yellow sclerae.No hearing loss, sneezing, congestion, runny nose or sore  throat.  SKIN: No rash or itching.  CARDIOVASCULAR: per hpi RESPIRATORY: No shortness of breath, cough or sputum.  GASTROINTESTINAL: No anorexia, nausea, vomiting or diarrhea. No abdominal pain or blood.  GENITOURINARY: No burning on urination, no polyuria NEUROLOGICAL: No headache, dizziness, syncope, paralysis, ataxia, numbness or tingling in the extremities. No change in bowel or bladder control.  MUSCULOSKELETAL: No muscle, back pain, joint pain or stiffness.  LYMPHATICS: No enlarged nodes. No history of splenectomy.  PSYCHIATRIC: No history of depression or anxiety.  ENDOCRINOLOGIC: No reports of sweating, cold or heat intolerance. No polyuria or polydipsia.  [name]   Physical Examination Today's Vitals   01/16/20 1344  BP: (!) 172/80  Pulse: 89  Resp: 16  SpO2: 95%  Weight: 174 lb 1.3 oz (79 kg)  Height: 5\' 6"  (1.676 m)  PainSc: 10-Worst pain ever   Body mass index is 28.1 kg/m.  Gen: resting comfortably, no acute distress HEENT: no scleral icterus, pupils equal round and reactive, no palptable cervical adenopathy,  CV: RRR, no m/r/g no jvd Resp: Clear to auscultation bilaterally GI: abdomen is soft, non-tender, non-distended, normal bowel sounds, no hepatosplenomegaly MSK: extremities are warm, no edema.  Skin: warm, no rash Neuro:  no focal deficits Psych: appropriate affect   Diagnostic Studies 12/2019 LE [place] Summary:  Left: No significant change as compared to previous study. Heterogenous  plaque throughout.  30-49% stenosis in the mid and distal SFA.   No significant findings noted in the left groin during supine and standing  positions. There is a lymph node at area of pain, measuring 1.5 x .90 x  .98 cm. This lymph node appears to be normal. Inguinal hernia not evident,  but can not be excluded.   09/2017  echo Study Conclusions   - Procedure narrative: Transthoracic echocardiography. Image  quality was adequate. The study was technically difficult, as a  result of poor patient compliance.  - Left ventricle: The cavity size was normal. There was mild  concentric and moderate basal septal hypertrophy. Systolic  function was normal. The estimated ejection fraction was in the  range of 60% to 65%. Wall motion was normal; there were no  regional wall motion abnormalities. Doppler parameters are  consistent with abnormal left ventricular relaxation (grade 1  diastolic dysfunction). Doppler parameters are consistent with  indeterminate ventricular filling pressure.  - Aortic valve: Mildly calcified annulus. Trileaflet; normal  thickness leaflets.  - Atrial septum: No defect or patent foramen ovale was identified.  - Tricuspid valve: There was mild regurgitation.  - Pericardium, extracardiac: A prominent pericardial fat pad was  present.    11/2016 cath    Ost RCA to Mid RCA lesion, 0 %stenosed.  Prox RCA lesion, 40 %stenosed.  Mid RCA to Dist RCA lesion, 90 %stenosed.  Dist RCA lesion, 99 %stenosed.  Ost RPDA lesion, 99 %stenosed.  Prox Cx to Mid Cx lesion, 99 %stenosed.  Ost 3rd Mrg to 3rd Mrg lesion, 80 %stenosed.  Ost LAD to Prox LAD lesion, 10 %stenosed.  Prox LAD to Mid LAD lesion, 40 %stenosed.  Dist LAD lesion, 50 %stenosed.   1. Triple vessel CAD 2. The LAD is a large caliber vessel that courses to the apex. The proximal vessel appears to have a stent but this could be heavy calcification. (No previous cath records available). The mid and distal vessel has mild to moderate non-obstructive disease.  3. The Circumflex has a sub-total occlusion in the proximal segment and fills distally from right to left  and left to left collaterals.  4. The RCA is a large dominant vessel with a patent proximal stent. The stented segment has focal moderate  restenosis. The mid vessel has severe stenosis. The small to moderate caliber PDA and posterolateral artery have severe stenosis.  5. Normal filling pressures.   Recommendations: He has stage 3 CKD. He has severe CAD. I think we should consider PCI of the RCA and possible the Circumflex artery. Given his renal dysfunction, will admit and hydrate today and plan PCI later this week. Continue ASA/Plavix.   11/2016 cath intervention 1. Unstable angina with inferior wall ischemia on nuclear stress test and severe mid RCA stenosis on diagnostic cath yesterday. Successful PTCA/DES x 1 mid RCA. There is residual disease in the small caliber PDA and posterolateral branches. These vessels are too small for PCI.  2. The IC team has reviewed the Circumflex artery disease. This is a small to moderate caliber artery with sub-total proximal to mid occlusion and diffuse disease throughout with collateral filling. Will not attempt PCI of this vessel.   Continue DAPT for one year with ASA and Plavix.     Assessment and Plan  1. CAD - no symptoms, continue current meds  2. PAD - prior stent, recent imaging was normal - ongoing left groin pain, defer to pcp give recent normal vascular studies  3. Hyperlipidemia - LDL at goal, continue statin and zetia. We discussed dietary and lifestyle modification to improve TGs and HDL  4. HTN - elevated today but has not taken meds, home bp's are at goal    F/u 6 months    Arnoldo Lenis, M.D.

## 2020-01-20 ENCOUNTER — Encounter: Payer: Self-pay | Admitting: Family Medicine

## 2020-01-20 ENCOUNTER — Other Ambulatory Visit: Payer: Self-pay

## 2020-01-20 ENCOUNTER — Ambulatory Visit (INDEPENDENT_AMBULATORY_CARE_PROVIDER_SITE_OTHER): Payer: Medicare PPO | Admitting: Family Medicine

## 2020-01-20 VITALS — BP 124/73 | HR 95 | Temp 97.6°F | Ht 66.0 in | Wt 172.0 lb

## 2020-01-20 DIAGNOSIS — K219 Gastro-esophageal reflux disease without esophagitis: Secondary | ICD-10-CM

## 2020-01-20 DIAGNOSIS — N182 Chronic kidney disease, stage 2 (mild): Secondary | ICD-10-CM | POA: Diagnosis not present

## 2020-01-20 DIAGNOSIS — Z9861 Coronary angioplasty status: Secondary | ICD-10-CM

## 2020-01-20 DIAGNOSIS — I2510 Atherosclerotic heart disease of native coronary artery without angina pectoris: Secondary | ICD-10-CM

## 2020-01-20 DIAGNOSIS — I10 Essential (primary) hypertension: Secondary | ICD-10-CM | POA: Diagnosis not present

## 2020-01-20 DIAGNOSIS — J3089 Other allergic rhinitis: Secondary | ICD-10-CM

## 2020-01-20 DIAGNOSIS — E1169 Type 2 diabetes mellitus with other specified complication: Secondary | ICD-10-CM

## 2020-01-20 DIAGNOSIS — Z23 Encounter for immunization: Secondary | ICD-10-CM

## 2020-01-20 DIAGNOSIS — E785 Hyperlipidemia, unspecified: Secondary | ICD-10-CM | POA: Diagnosis not present

## 2020-01-20 DIAGNOSIS — E1122 Type 2 diabetes mellitus with diabetic chronic kidney disease: Secondary | ICD-10-CM

## 2020-01-20 LAB — BAYER DCA HB A1C WAIVED: HB A1C (BAYER DCA - WAIVED): 5.7 % (ref <7.0)

## 2020-01-20 MED ORDER — PANTOPRAZOLE SODIUM 40 MG PO TBEC: 40.0000 mg | DELAYED_RELEASE_TABLET | Freq: Every day | ORAL | 1 refills | Status: DC

## 2020-01-20 MED ORDER — CARVEDILOL 25 MG PO TABS: 25.0000 mg | ORAL_TABLET | Freq: Two times a day (BID) | ORAL | 1 refills | Status: DC

## 2020-01-20 MED ORDER — LISINOPRIL 5 MG PO TABS: 5.0000 mg | ORAL_TABLET | Freq: Every day | ORAL | 1 refills | Status: DC

## 2020-01-20 MED ORDER — CLOPIDOGREL BISULFATE 75 MG PO TABS: 75.0000 mg | ORAL_TABLET | Freq: Every day | ORAL | 1 refills | Status: DC

## 2020-01-20 MED ORDER — VICTOZA 18 MG/3ML ~~LOC~~ SOPN: PEN_INJECTOR | SUBCUTANEOUS | 2 refills | Status: DC

## 2020-01-20 MED ORDER — TORSEMIDE 20 MG PO TABS: 20.0000 mg | ORAL_TABLET | Freq: Two times a day (BID) | ORAL | 1 refills | Status: DC

## 2020-01-20 MED ORDER — EZETIMIBE 10 MG PO TABS: 10.0000 mg | ORAL_TABLET | Freq: Every day | ORAL | 1 refills | Status: DC

## 2020-01-20 MED ORDER — PRAVASTATIN SODIUM 80 MG PO TABS: 80.0000 mg | ORAL_TABLET | Freq: Every evening | ORAL | 1 refills | Status: DC

## 2020-01-20 MED ORDER — ISOSORBIDE MONONITRATE ER 60 MG PO TB24: ORAL_TABLET | ORAL | 1 refills | Status: DC

## 2020-01-20 MED ORDER — CETIRIZINE HCL 10 MG PO TABS: 10.0000 mg | ORAL_TABLET | Freq: Every day | ORAL | 3 refills | Status: AC | PRN

## 2020-01-20 NOTE — Progress Notes (Signed)
Subjective: CC: chronic follow up PCP: Loman Brooklyn, FNP  Tommy Douglas is a 69 y.o. male presenting to clinic today for:  1. T2DM Pt presents for follow up evaluation of Type 2 diabetes mellitus.  Current symptoms include none. Patient denies foot ulcerations, hypoglycemia , nausea, paresthesia of the feet, polydipsia, polyuria and visual disturbances.  Current diabetic medications include Victoza daily Compliant with meds - Yes  Current monitoring regimen: in am, usually 100-130 Home blood sugar records: fasting range: 100-130 Any episodes of hypoglycemia? no  Known diabetic complications: nephropathy and cardiovascular disease Cardiovascular risk factors: advanced age (older than 59 for men, 73 for women), diabetes mellitus, dyslipidemia, hypertension and male gender Eye exam current (within one year): yes, patient will call office with location that he had eye exam  Podiatry yearly?  No Weight trend: stable Current diet: diabetic Current exercise: none due to leg pain from previous surgery  PNA Vaccine UTD?  No, declined Tdap Vaccine UTD?  No, declined today Urine microalbumin UTD? Yes  Is He on ACE inhibitor or angiotensin II receptor blocker?  Yes, lisinopril 5 mg Is He on statin? Yes pravastatin 80 mg, also takes Zetia 10 mg Is He on ASA 81 mg daily?  Yes  2. HTN Complaint with meds - Yes Current Medications - Coreg 25 mg, lisinopril 5 mg, torsemide 20 mg BID Checking BP at home ranging 120/80s Exercising Regularly - No Watching Salt intake - Yes Pertinent ROS:  Headache - No Fatigue - No Visual Disturbances - No Chest pain - No Dyspnea - No Palpitations - No LE edema - No They report good compliance with medications and can restate their regimen by memory. No medication side effects.   3. HLD Taking pravastatin 80 mg and Zetia 10 mg. Reports compliance with medications. He does not exercise. He tries to watch his diet.   4. GERD Doing well  on protonix. Denies nausea, vomiting, abdominal pain, difficulty swallowing, or blood in stool.   5. CAD/PVD Managed by cardiology. Last visit was 01/16/20. Well controlled.   He needs refills on his medications today.  Relevant past medical, surgical, family, and social history reviewed and updated as indicated.  Allergies and medications reviewed and updated.  Allergies  Allergen Reactions   Atorvastatin     All statins make him cough   Statins Cough    Pt is currently taking pravastatin    Sulfa Antibiotics    Lexapro [Escitalopram] Palpitations   Past Medical History:  Diagnosis Date   Anxiety    Arthritis    "qwhere" (12/20/2016)   Chronic lower back pain    CKD (chronic kidney disease), stage IV (HCC)    Complication of anesthesia    "got the wrong kind of anesthesia ~ 2000 when they were looking down into my stomach"   Coronary artery disease    Gout    Heart disease    Hyperlipidemia    Hypertension    Pneumonia ~ 2015   Seasonal allergies    Type II diabetes mellitus ([place])     Current Outpatient Medications:    albuterol (VENTOLIN HFA) 108 (90 Base) MCG/ACT inhaler, Inhale 2 puffs into the lungs every 6 (six) hours as needed for wheezing or shortness of breath., Disp: 18 g, Rfl: 5   allopurinol (ZYLOPRIM) 100 MG tablet, Take 1 tablet (100 mg total) by mouth 3 (three) times daily., Disp: 270 tablet, Rfl: 1   aspirin EC 81 MG tablet, Take 81 mg  by mouth at bedtime. , Disp: , Rfl:    carvedilol (COREG) 25 MG tablet, Take 1 tablet (25 mg total) by mouth 2 (two) times daily., Disp: 180 tablet, Rfl: 1   cetirizine (ZYRTEC) 10 MG tablet, Take 1 tablet (10 mg total) by mouth daily as needed (for allergies.)., Disp: 90 tablet, Rfl: 3   clopidogrel (PLAVIX) 75 MG tablet, Take 1 tablet (75 mg total) by mouth at bedtime., Disp: 90 tablet, Rfl: 1   cyclobenzaprine (FLEXERIL) 10 MG tablet, Take 1 tablet (10 mg total) by mouth at bedtime. for muscle  spams, Disp: 90 tablet, Rfl: 1   ergocalciferol (VITAMIN D2) 1.25 MG (50000 UT) capsule, Take 1 capsule (50,000 Units total) by mouth once a week., Disp: 12 capsule, Rfl: 3   ezetimibe (ZETIA) 10 MG tablet, Take 1 tablet (10 mg total) by mouth at bedtime., Disp: 90 tablet, Rfl: 1   HYDROcodone-acetaminophen (NORCO) 10-325 MG tablet, Take 1 tablet by mouth every 6 (six) hours as needed (pain.)., Disp: , Rfl:    isosorbide mononitrate (IMDUR) 60 MG 24 hr tablet, TAKE ONE (1) TABLET EACH DAY, Disp: 90 tablet, Rfl: 1   liraglutide (VICTOZA) 18 MG/3ML SOPN, INJECT 0.3ML (1.8MG ) SQ DAILY, Disp: 27 mL, Rfl: 2   lisinopril (ZESTRIL) 5 MG tablet, Take 1 tablet (5 mg total) by mouth at bedtime., Disp: 90 tablet, Rfl: 1   Multiple Vitamin (MULTIVITAMIN WITH MINERALS) TABS tablet, Take 1 tablet by mouth at bedtime. Centrum Silver, Disp: , Rfl:    nitroGLYCERIN (NITROSTAT) 0.4 MG SL tablet, Place 1 tablet (0.4 mg total) under the tongue every 5 (five) minutes as needed for chest pain., Disp: 25 tablet, Rfl: 3   pantoprazole (PROTONIX) 40 MG tablet, Take 1 tablet (40 mg total) by mouth daily at 6 (six) AM., Disp: 90 tablet, Rfl: 1   pravastatin (PRAVACHOL) 80 MG tablet, Take 1 tablet (80 mg total) by mouth every evening., Disp: 90 tablet, Rfl: 1   pregabalin (LYRICA) 75 MG capsule, Take 1 capsule (75 mg total) by mouth 3 (three) times daily. New lower RENAL DOSING, Disp: 90 capsule, Rfl: 5   torsemide (DEMADEX) 20 MG tablet, Take 1 tablet (20 mg total) by mouth 2 (two) times daily., Disp: 180 tablet, Rfl: 1   ULTICARE MICRO PEN NEEDLES 32G X 4 MM MISC, EVERY DAY, Disp: 100 each, Rfl: 3 Social History   Socioeconomic History   Marital status: Widowed    Spouse name: Not on file   Number of children: 7   Years of education: Not on file   Highest education level: High school graduate  Occupational History   Occupation: retired     Comment: Designer, television/film set and Manufacturing engineer co.  Tobacco  Use   Smoking status: Current Every Day Smoker    Packs/day: 0.50    Years: 44.00    Pack years: 22.00    Types: Cigarettes   Smokeless tobacco: Never Used  Scientific laboratory technician Use: Never used  Substance and Sexual Activity   Alcohol use: No   Drug use: No   Sexual activity: Not on file  Other Topics Concern   Not on file  Social History Narrative   Not on file   Social Determinants of Health   Financial Resource Strain:    Difficulty of Paying Living Expenses: Not on file  Food Insecurity:    Worried About [place] in the Last Year: Not on file   [employer] of  Food in the Last Year: Not on file  Transportation Needs:    Lack of Transportation (Medical): Not on file   Lack of Transportation (Non-Medical): Not on file  Physical Activity:    Days of Exercise per Week: Not on file   Minutes of Exercise per Session: Not on file  Stress:    Feeling of Stress : Not on file  Social Connections:    Frequency of Communication with Friends and Family: Not on file   Frequency of Social Gatherings with Friends and Family: Not on file   Attends Religious Services: Not on file   Active Member of Clubs or Organizations: Not on file   Attends Archivist Meetings: Not on file   Marital Status: Not on file  Intimate Partner Violence:    Fear of Current or Ex-Partner: Not on file   Emotionally Abused: Not on file   Physically Abused: Not on file   Sexually Abused: Not on file   Family History  Problem Relation Age of Onset   Cancer Mother        male   Diabetes Mother    Stroke Father        x3   Stroke Brother        x2    Other Sister        bowel necrosis?    Other Daughter        gout   Other Son        gout   Alcohol abuse Brother    Other Sister        pneumonia   Other Sister        pneumonia   Cancer Daughter        male   Other Daughter        fluid on brain - disabled     Review of Systems  Per  HPI   Objective: Office vital signs reviewed. BP 124/73    Pulse 95    Temp 97.6 F (36.4 C) (Temporal)    Ht 5\' 6"  (1.676 m)    Wt 172 lb (78 kg)    BMI 27.76 kg/m   Physical Examination:  Physical Exam Vitals and nursing note reviewed.  Constitutional:      General: He is not in acute distress.    Appearance: Normal appearance. He is not ill-appearing, toxic-appearing or diaphoretic.  Cardiovascular:     Rate and Rhythm: Normal rate and regular rhythm.     Heart sounds: Normal heart sounds. No murmur heard.   Pulmonary:     Effort: Pulmonary effort is normal. No respiratory distress.     Breath sounds: Normal breath sounds.  Abdominal:     General: Bowel sounds are normal. There is no distension.     Palpations: Abdomen is soft.     Tenderness: There is no abdominal tenderness. There is no guarding.  Musculoskeletal:     Right lower leg: No edema.     Left lower leg: No edema.  Skin:    General: Skin is dry.     Capillary Refill: Capillary refill takes less than 2 seconds.  Neurological:     Mental Status: He is alert and oriented to person, place, and time.  Psychiatric:        Mood and Affect: Mood normal.        Behavior: Behavior normal.      Results for orders placed or performed during the hospital encounter of 11/04/19  Basic  metabolic panel  Result Value Ref Range   Sodium 137 135 - 145 mmol/L   Potassium 4.1 3.5 - 5.1 mmol/L   Chloride 102 98 - 111 mmol/L   CO2 25 22 - 32 mmol/L   Glucose, Bld 184 (H) 70 - 99 mg/dL   BUN 25 (H) 8 - 23 mg/dL   Creatinine, Ser 1.97 (H) 0.61 - 1.24 mg/dL   Calcium 9.0 8.9 - 10.3 mg/dL   GFR calc non Af Amer 34 (L) >60 mL/min   GFR calc Af Amer 39 (L) >60 mL/min   Anion gap 10 5 - 15     Assessment/ Plan: Tommy Douglas was seen today for medical management of chronic issues.  Diagnoses and all orders for this visit:  Type 2 diabetes mellitus with stage 2 chronic kidney disease, without long-term current use of insulin  (HCC) Well controlled. A1C 5.7 today. Continue current regimen. Eye exam was recently done but he is unable to remember where it was done at. He will notify office of location.  -     Bayer DCA Hb A1c Waived -     liraglutide (VICTOZA) 18 MG/3ML SOPN; INJECT 0.3ML (1.8MG ) SQ DAILY  Essential hypertension BP well controlled continue current regimen.  -     lisinopril (ZESTRIL) 5 MG tablet; Take 1 tablet (5 mg total) by mouth at bedtime. -     torsemide (DEMADEX) 20 MG tablet; Take 1 tablet (20 mg total) by mouth 2 (two) times daily.       -     carvedilol (COREG) 25 MG tablet; Take 1 tablet (25       mg total) by mouth 2 (two) times daily.  Hyperlipidemia associated with type 2 diabetes mellitus ([place]) LDL at goal on last lipid panel. Will repeat lipid panel in 3 months.  -     ezetimibe (ZETIA) 10 MG tablet; Take 1 tablet (10 mg total) by mouth at bedtime. -     pravastatin (PRAVACHOL) 80 MG tablet; Take 1 tablet (80 mg total) by mouth every evening.  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  Gastroesophageal reflux disease without esophagitis Well controlled. No alarm signs.  -     pantoprazole (PROTONIX) 40 MG tablet; Take 1 tablet (40 mg total) by mouth daily at 6 (six) AM.  CAD S/P percutaneous coronary angioplasty Managed by cardiology.  -     clopidogrel (PLAVIX) 75 MG tablet; Take 1 tablet (75 mg total) by mouth at bedtime.  Need for immunization against influenza Vaccine today in office.  -     Flu Vaccine QUAD High Dose(Fluad)  Follow up in 3 months, sooner if needed.   The above assessment and management plan was discussed with the patient. The patient verbalized understanding of and has agreed to the management plan. Patient is aware to call the clinic if symptoms persist or worsen. Patient is aware when to return to the clinic for a follow-up visit.  Patient educated on when it is appropriate to go to the emergency department.   Marjorie Smolder, FNP-C [facility] [address] [place], [place] [address] [phone]

## 2020-01-20 NOTE — Patient Instructions (Signed)
DASH Eating Plan DASH stands for "Dietary Approaches to Stop Hypertension." The DASH eating plan is a healthy eating plan that has been shown to reduce high blood pressure (hypertension). It may also reduce your risk for type 2 diabetes, heart disease, and stroke. The DASH eating plan may also help with weight loss. What are tips for following this plan?  General guidelines  Avoid eating more than 2,300 mg (milligrams) of salt (sodium) a day. If you have hypertension, you may need to reduce your sodium intake to 1,500 mg a day.  Limit alcohol intake to no more than 1 drink a day for nonpregnant women and 2 drinks a day for men. One drink equals 12 oz of beer, 5 oz of wine, or 1 oz of hard liquor.  Work with your health care provider to maintain a healthy body weight or to lose weight. Ask what an ideal weight is for you.  Get at least 30 minutes of exercise that causes your heart to beat faster (aerobic exercise) most days of the week. Activities may include walking, swimming, or biking.  Work with your health care provider or diet and nutrition specialist (dietitian) to adjust your eating plan to your individual calorie needs. Reading food labels   Check food labels for the amount of sodium per serving. Choose foods with less than 5 percent of the Daily Value of sodium. Generally, foods with less than 300 mg of sodium per serving fit into this eating plan.  To find whole grains, look for the word "whole" as the first word in the ingredient list. Shopping  Buy products labeled as "low-sodium" or "no salt added."  Buy fresh foods. Avoid canned foods and premade or frozen meals. Cooking  Avoid adding salt when cooking. Use salt-free seasonings or herbs instead of table salt or sea salt. Check with your health care provider or pharmacist before using salt substitutes.  Do not fry foods. Cook foods using healthy methods such as baking, boiling, grilling, and broiling instead.  Cook with  heart-healthy oils, such as olive, canola, soybean, or sunflower oil. Meal planning  Eat a balanced diet that includes: ? 5 or more servings of fruits and vegetables each day. At each meal, try to fill half of your plate with fruits and vegetables. ? Up to 6-8 servings of whole grains each day. ? Less than 6 oz of lean meat, poultry, or fish each day. A 3-oz serving of meat is about the same size as a deck of cards. One egg equals 1 oz. ? 2 servings of low-fat dairy each day. ? A serving of nuts, seeds, or beans 5 times each week. ? Heart-healthy fats. Healthy fats called Omega-3 fatty acids are found in foods such as flaxseeds and coldwater fish, like sardines, salmon, and mackerel.  Limit how much you eat of the following: ? Canned or prepackaged foods. ? Food that is high in trans fat, such as fried foods. ? Food that is high in saturated fat, such as fatty meat. ? Sweets, desserts, sugary drinks, and other foods with added sugar. ? Full-fat dairy products.  Do not salt foods before eating.  Try to eat at least 2 vegetarian meals each week.  Eat more home-cooked food and less restaurant, buffet, and fast food.  When eating at a restaurant, ask that your food be prepared with less salt or no salt, if possible. What foods are recommended? The items listed may not be a complete list. Talk with your dietitian about   what dietary choices are best for you. Grains Whole-grain or whole-wheat bread. Whole-grain or whole-wheat pasta. Brown rice. Oatmeal. Quinoa. Bulgur. Whole-grain and low-sodium cereals. Pita bread. Low-fat, low-sodium crackers. Whole-wheat flour tortillas. Vegetables Fresh or frozen vegetables (raw, steamed, roasted, or grilled). Low-sodium or reduced-sodium tomato and vegetable juice. Low-sodium or reduced-sodium tomato sauce and tomato paste. Low-sodium or reduced-sodium canned vegetables. Fruits All fresh, dried, or frozen fruit. Canned fruit in natural juice (without  added sugar). Meat and other protein foods Skinless chicken or turkey. Ground chicken or turkey. Pork with fat trimmed off. Fish and seafood. Egg whites. Dried beans, peas, or lentils. Unsalted nuts, nut butters, and seeds. Unsalted canned beans. Lean cuts of beef with fat trimmed off. Low-sodium, lean deli meat. Dairy Low-fat (1%) or fat-free (skim) milk. Fat-free, low-fat, or reduced-fat cheeses. Nonfat, low-sodium ricotta or cottage cheese. Low-fat or nonfat yogurt. Low-fat, low-sodium cheese. Fats and oils Soft margarine without trans fats. Vegetable oil. Low-fat, reduced-fat, or light mayonnaise and salad dressings (reduced-sodium). Canola, safflower, olive, soybean, and sunflower oils. Avocado. Seasoning and other foods Herbs. Spices. Seasoning mixes without salt. Unsalted popcorn and pretzels. Fat-free sweets. What foods are not recommended? The items listed may not be a complete list. Talk with your dietitian about what dietary choices are best for you. Grains Baked goods made with fat, such as croissants, muffins, or some breads. Dry pasta or rice meal packs. Vegetables Creamed or fried vegetables. Vegetables in a cheese sauce. Regular canned vegetables (not low-sodium or reduced-sodium). Regular canned tomato sauce and paste (not low-sodium or reduced-sodium). Regular tomato and vegetable juice (not low-sodium or reduced-sodium). Pickles. Olives. Fruits Canned fruit in a light or heavy syrup. Fried fruit. Fruit in cream or butter sauce. Meat and other protein foods Fatty cuts of meat. Ribs. Fried meat. Bacon. Sausage. Bologna and other processed lunch meats. Salami. Fatback. Hotdogs. Bratwurst. Salted nuts and seeds. Canned beans with added salt. Canned or smoked fish. Whole eggs or egg yolks. Chicken or turkey with skin. Dairy Whole or 2% milk, cream, and half-and-half. Whole or full-fat cream cheese. Whole-fat or sweetened yogurt. Full-fat cheese. Nondairy creamers. Whipped toppings.  Processed cheese and cheese spreads. Fats and oils Butter. Stick margarine. Lard. Shortening. Ghee. Bacon fat. Tropical oils, such as coconut, palm kernel, or palm oil. Seasoning and other foods Salted popcorn and pretzels. Onion salt, garlic salt, seasoned salt, table salt, and sea salt. Worcestershire sauce. Tartar sauce. Barbecue sauce. Teriyaki sauce. Soy sauce, including reduced-sodium. Steak sauce. Canned and packaged gravies. Fish sauce. Oyster sauce. Cocktail sauce. Horseradish that you find on the shelf. Ketchup. Mustard. Meat flavorings and tenderizers. Bouillon cubes. Hot sauce and Tabasco sauce. Premade or packaged marinades. Premade or packaged taco seasonings. Relishes. Regular salad dressings. Where to find more information:  National Heart, Lung, and Blood Institute: www.nhlbi.nih.gov  American Heart Association: www.heart.org Summary  The DASH eating plan is a healthy eating plan that has been shown to reduce high blood pressure (hypertension). It may also reduce your risk for type 2 diabetes, heart disease, and stroke.  With the DASH eating plan, you should limit salt (sodium) intake to 2,300 mg a day. If you have hypertension, you may need to reduce your sodium intake to 1,500 mg a day.  When on the DASH eating plan, aim to eat more fresh fruits and vegetables, whole grains, lean proteins, low-fat dairy, and heart-healthy fats.  Work with your health care provider or diet and nutrition specialist (dietitian) to adjust your eating plan to your   individual calorie needs. This information is not intended to replace advice given to you by your health care provider. Make sure you discuss any questions you have with your health care provider. Document Revised: 02/23/2017 Document Reviewed: 03/06/2016 Elsevier Patient Education  2020 [employer]  

## 2020-01-22 ENCOUNTER — Other Ambulatory Visit: Payer: Medicare PPO

## 2020-01-23 ENCOUNTER — Ambulatory Visit: Payer: Medicare PPO | Admitting: Family Medicine

## 2020-02-12 DIAGNOSIS — F17210 Nicotine dependence, cigarettes, uncomplicated: Secondary | ICD-10-CM | POA: Diagnosis not present

## 2020-02-12 DIAGNOSIS — N183 Chronic kidney disease, stage 3 unspecified: Secondary | ICD-10-CM | POA: Diagnosis not present

## 2020-02-12 DIAGNOSIS — Z79899 Other long term (current) drug therapy: Secondary | ICD-10-CM | POA: Diagnosis not present

## 2020-02-12 DIAGNOSIS — M5136 Other intervertebral disc degeneration, lumbar region: Secondary | ICD-10-CM | POA: Diagnosis not present

## 2020-02-12 DIAGNOSIS — M79605 Pain in left leg: Secondary | ICD-10-CM | POA: Diagnosis not present

## 2020-03-17 DIAGNOSIS — M79605 Pain in left leg: Secondary | ICD-10-CM | POA: Diagnosis not present

## 2020-03-17 DIAGNOSIS — F17210 Nicotine dependence, cigarettes, uncomplicated: Secondary | ICD-10-CM | POA: Diagnosis not present

## 2020-03-17 DIAGNOSIS — Z79899 Other long term (current) drug therapy: Secondary | ICD-10-CM | POA: Diagnosis not present

## 2020-03-17 DIAGNOSIS — Z20822 Contact with and (suspected) exposure to covid-19: Secondary | ICD-10-CM | POA: Diagnosis not present

## 2020-03-17 DIAGNOSIS — N183 Chronic kidney disease, stage 3 unspecified: Secondary | ICD-10-CM | POA: Diagnosis not present

## 2020-03-17 DIAGNOSIS — M5136 Other intervertebral disc degeneration, lumbar region: Secondary | ICD-10-CM | POA: Diagnosis not present

## 2020-04-22 ENCOUNTER — Other Ambulatory Visit: Payer: Self-pay | Admitting: Family Medicine

## 2020-04-22 DIAGNOSIS — M5136 Other intervertebral disc degeneration, lumbar region: Secondary | ICD-10-CM

## 2020-04-23 ENCOUNTER — Other Ambulatory Visit: Payer: Self-pay

## 2020-04-23 ENCOUNTER — Encounter: Payer: Self-pay | Admitting: Family Medicine

## 2020-04-23 ENCOUNTER — Ambulatory Visit: Payer: Medicare PPO | Admitting: Family Medicine

## 2020-04-23 VITALS — BP 172/90 | HR 111 | Temp 98.1°F | Ht 66.0 in | Wt 172.4 lb

## 2020-04-23 DIAGNOSIS — M5136 Other intervertebral disc degeneration, lumbar region: Secondary | ICD-10-CM

## 2020-04-23 DIAGNOSIS — E785 Hyperlipidemia, unspecified: Secondary | ICD-10-CM

## 2020-04-23 DIAGNOSIS — E1122 Type 2 diabetes mellitus with diabetic chronic kidney disease: Secondary | ICD-10-CM | POA: Diagnosis not present

## 2020-04-23 DIAGNOSIS — I10 Essential (primary) hypertension: Secondary | ICD-10-CM | POA: Diagnosis not present

## 2020-04-23 DIAGNOSIS — E1169 Type 2 diabetes mellitus with other specified complication: Secondary | ICD-10-CM | POA: Diagnosis not present

## 2020-04-23 DIAGNOSIS — E1121 Type 2 diabetes mellitus with diabetic nephropathy: Secondary | ICD-10-CM

## 2020-04-23 DIAGNOSIS — N1832 Chronic kidney disease, stage 3b: Secondary | ICD-10-CM

## 2020-04-23 DIAGNOSIS — M1A9XX0 Chronic gout, unspecified, without tophus (tophi): Secondary | ICD-10-CM | POA: Diagnosis not present

## 2020-04-23 DIAGNOSIS — N182 Chronic kidney disease, stage 2 (mild): Secondary | ICD-10-CM

## 2020-04-23 DIAGNOSIS — Z0000 Encounter for general adult medical examination without abnormal findings: Secondary | ICD-10-CM

## 2020-04-23 LAB — BAYER DCA HB A1C WAIVED: HB A1C (BAYER DCA - WAIVED): 5.7 % (ref <7.0)

## 2020-04-23 MED ORDER — PREGABALIN 75 MG PO CAPS: 75.0000 mg | ORAL_CAPSULE | Freq: Three times a day (TID) | ORAL | 5 refills | Status: DC

## 2020-04-23 MED ORDER — CYCLOBENZAPRINE HCL 10 MG PO TABS: 10.0000 mg | ORAL_TABLET | Freq: Every day | ORAL | 1 refills | Status: DC

## 2020-04-23 MED ORDER — ALLOPURINOL 100 MG PO TABS: 100.0000 mg | ORAL_TABLET | Freq: Three times a day (TID) | ORAL | 1 refills | Status: DC

## 2020-04-23 NOTE — Progress Notes (Signed)
Assessment & Plan:  1. Type 2 diabetes mellitus with stage 2 chronic kidney disease, without long-term current use of insulin (HCC) Lab Results  Component Value Date   HGBA1C 5.7 04/23/2020   HGBA1C 5.7 01/20/2020   HGBA1C 6.3 09/11/2019    - Diabetes is at goal of A1c < 7. - Medications: continue current medications - Home glucose monitoring: Continue monitoring - Patient is currently taking a statin. Patient is taking an ACE-inhibitor/ARB.   Diabetes Health Maintenance Due  Topic Date Due  . OPHTHALMOLOGY EXAM  Never done  . FOOT EXAM  09/10/2020  . HEMOGLOBIN A1C  10/21/2020    We have requested diabetic eye exam but if not yet received the record.  Lab Results  Component Value Date   LABMICR 394.1 04/23/2020   LABMICR 77.2 03/11/2019   - CBC with Differential/Platelet - CMP14+EGFR - Lipid panel - Microalbumin / creatinine urine ratio - Bayer DCA Hb A1c Waived  2. Essential hypertension - Elevated today due to pain.  Patient encouraged to keep a log of his blood pressure and bring it with him to his next visit.  Education provided on the DASH diet. - CBC with Differential/Platelet - CMP14+EGFR - Lipid panel  3. Hyperlipidemia associated with type 2 diabetes mellitus ([place]) - Labs to assess. - CMP14+EGFR - Lipid panel  4. Chronic gout without tophus, unspecified cause, unspecified site - Well controlled on current regimen.  - CMP14+EGFR - allopurinol (ZYLOPRIM) 100 MG tablet; Take 1 tablet (100 mg total) by mouth 3 (three) times daily.  Dispense: 270 tablet; Refill: 1  5. Stage 3b chronic kidney disease ([place]) - Labs to assess. - CMP14+EGFR  6. Diabetic nephropathy associated with type 2 diabetes mellitus ([place]) - Urine to assess. - Microalbumin / creatinine urine ratio  7. Healthcare maintenance - Patient is agreeable to a colonoscopy after he gets his pain under control.   Return in about 6 weeks (around 06/04/2020) for HTN.  Hendricks Limes, MSN, APRN,  FNP-C [facility]  Subjective:    Patient ID: Tommy Douglas, male    DOB: [birth year], 70 y.o.   MRN: [id number]  Patient Care Team: Loman Brooklyn, FNP as PCP - General (Family Medicine) Herminio Commons, MD (Inactive) as PCP - Cardiology (Cardiology) Herminio Commons, MD (Inactive) as Attending Physician (Cardiology) Lorretta Harp, MD as Consulting Physician (Cardiology) Fran Lowes, MD (Inactive) as Consulting Physician (Nephrology) Gala Romney Cristopher Estimable, MD as Consulting Physician (Gastroenterology) Lavera Guise, [facility] (Pharmacist)   Chief Complaint:  Chief Complaint  Patient presents with  . Diabetes    3 month follow up of chronic medical conditions     HPI: Tommy Douglas is a 70 y.o. male presenting on 04/23/2020 for Diabetes (3 month follow up of chronic medical conditions/)  Diabetes: Patient presents for follow up of diabetes. Current symptoms include: none. Known diabetic complications: nephropathy. Medication compliance: Yes. Current diet: in general, a "healthy" diet  . Current exercise: none. Home blood sugar records: BGs are running  consistent with Hgb A1C. Is he  on ACE inhibitor or angiotensin II receptor blocker? Yes. Is he on a statin? Yes.   Hypertension: Patient reports his blood pressure at home is mostly 120s over 80s.  He feels it is elevated today due to the amount of pain he is in.  New complaints: None  Social history:  Relevant past medical, surgical, family and social history reviewed and updated as indicated. Interim medical history since  our last visit reviewed.  Allergies and medications reviewed and updated.  DATA REVIEWED: CHART IN EPIC  ROS: Negative unless specifically indicated above in HPI.    Current Outpatient Medications:  .  albuterol (VENTOLIN HFA) 108 (90 Base) MCG/ACT inhaler, Inhale 2 puffs into the lungs every 6 (six) hours as needed for wheezing or shortness of breath., Disp: 18 g,  Rfl: 5 .  allopurinol (ZYLOPRIM) 100 MG tablet, Take 1 tablet (100 mg total) by mouth 3 (three) times daily., Disp: 270 tablet, Rfl: 1 .  aspirin EC 81 MG tablet, Take 81 mg by mouth at bedtime. , Disp: , Rfl:  .  carvedilol (COREG) 25 MG tablet, Take 1 tablet (25 mg total) by mouth 2 (two) times daily., Disp: 180 tablet, Rfl: 1 .  cetirizine (ZYRTEC) 10 MG tablet, Take 1 tablet (10 mg total) by mouth daily as needed (for allergies.)., Disp: 90 tablet, Rfl: 3 .  clopidogrel (PLAVIX) 75 MG tablet, Take 1 tablet (75 mg total) by mouth at bedtime., Disp: 90 tablet, Rfl: 1 .  cyclobenzaprine (FLEXERIL) 10 MG tablet, Take 1 tablet (10 mg total) by mouth at bedtime. for muscle spams, Disp: 90 tablet, Rfl: 1 .  ergocalciferol (VITAMIN D2) 1.25 MG (50000 UT) capsule, Take 1 capsule (50,000 Units total) by mouth once a week., Disp: 12 capsule, Rfl: 3 .  ezetimibe (ZETIA) 10 MG tablet, Take 1 tablet (10 mg total) by mouth at bedtime., Disp: 90 tablet, Rfl: 1 .  HYDROcodone-acetaminophen (NORCO) 10-325 MG tablet, Take 1 tablet by mouth every 6 (six) hours as needed (pain.)., Disp: , Rfl:  .  isosorbide mononitrate (IMDUR) 60 MG 24 hr tablet, TAKE ONE (1) TABLET EACH DAY, Disp: 90 tablet, Rfl: 1 .  liraglutide (VICTOZA) 18 MG/3ML SOPN, INJECT 0.3ML (1.8MG) SQ DAILY, Disp: 27 mL, Rfl: 2 .  lisinopril (ZESTRIL) 5 MG tablet, Take 1 tablet (5 mg total) by mouth at bedtime., Disp: 90 tablet, Rfl: 1 .  Multiple Vitamin (MULTIVITAMIN WITH MINERALS) TABS tablet, Take 1 tablet by mouth at bedtime. Centrum Silver, Disp: , Rfl:  .  nitroGLYCERIN (NITROSTAT) 0.4 MG SL tablet, Place 1 tablet (0.4 mg total) under the tongue every 5 (five) minutes as needed for chest pain., Disp: 25 tablet, Rfl: 3 .  pantoprazole (PROTONIX) 40 MG tablet, Take 1 tablet (40 mg total) by mouth daily at 6 (six) AM., Disp: 90 tablet, Rfl: 1 .  pravastatin (PRAVACHOL) 80 MG tablet, Take 1 tablet (80 mg total) by mouth every evening., Disp: 90  tablet, Rfl: 1 .  pregabalin (LYRICA) 75 MG capsule, Take 1 capsule (75 mg total) by mouth 3 (three) times daily. New lower RENAL DOSING, Disp: 90 capsule, Rfl: 5 .  torsemide (DEMADEX) 20 MG tablet, Take 1 tablet (20 mg total) by mouth 2 (two) times daily., Disp: 180 tablet, Rfl: 1 .  ULTICARE MICRO PEN NEEDLES 32G X 4 MM MISC, EVERY DAY, Disp: 100 each, Rfl: 3   Allergies  Allergen Reactions  . Atorvastatin     All statins make him cough  . Statins Cough    Pt is currently taking pravastatin   . Sulfa Antibiotics   . Lexapro [Escitalopram] Palpitations   Past Medical History:  Diagnosis Date  . Anxiety   . Arthritis    "qwhere" (12/20/2016)  . Chronic lower back pain   . CKD (chronic kidney disease), stage IV ([place])   . Complication of anesthesia    "got the wrong kind of anesthesia ~  2000 when they were looking down into my stomach"  . Coronary artery disease   . Gout   . Heart disease   . Hyperlipidemia   . Hypertension   . Pneumonia ~ 2015  . Seasonal allergies   . Type II diabetes mellitus ([place])     Past Surgical History:  Procedure Laterality Date  . ABDOMINAL AORTOGRAM W/LOWER EXTREMITY N/A 06/02/2019   Procedure: ABDOMINAL AORTOGRAM W/LOWER EXTREMITY;  Surgeon: Lorretta Harp, MD;  Location: [facility];  Service: Cardiovascular;  Laterality: N/A;  . CARDIAC CATHETERIZATION  12/20/2016  . CORONARY ANGIOPLASTY WITH STENT PLACEMENT  2004 X 2   "1st stent moved"  . CORONARY STENT INTERVENTION N/A 12/21/2016   Procedure: CORONARY STENT INTERVENTION;  Surgeon: Burnell Blanks, MD;  Location: [facility];  Service: Cardiovascular;  Laterality: N/A;  . ESOPHAGOGASTRODUODENOSCOPY  ~ 2000  . LAPAROSCOPIC CHOLECYSTECTOMY    . LEFT HEART CATH AND CORONARY ANGIOGRAPHY N/A 12/20/2016   Procedure: LEFT HEART CATH AND CORONARY ANGIOGRAPHY;  Surgeon: Burnell Blanks, MD;  Location: [facility];  Service: Cardiovascular;  Laterality: N/A;     Social History   Socioeconomic History  . Marital status: Widowed    Spouse name: Not on file  . Number of children: 7  . Years of education: Not on file  . Highest education level: High school graduate  Occupational History  . Occupation: retired     Comment: Designer, television/film set and Manufacturing engineer co.  Tobacco Use  . Smoking status: Current Every Day Smoker    Packs/day: 0.50    Years: 44.00    Pack years: 22.00    Types: Cigarettes  . Smokeless tobacco: Never Used  Vaping Use  . Vaping Use: Never used  Substance and Sexual Activity  . Alcohol use: No  . Drug use: No  . Sexual activity: Not on file  Other Topics Concern  . Not on file  Social History Narrative  . Not on file   Social Determinants of Health   Financial Resource Strain: Not on file  Food Insecurity: Not on file  Transportation Needs: Not on file  Physical Activity: Not on file  Stress: Not on file  Social Connections: Not on file  Intimate Partner Violence: Not on file        Objective:    BP (!) 172/90   Pulse (!) 111   Temp 98.1 F (36.7 C) (Temporal)   Ht 5' 6"  (1.676 m)   Wt 172 lb 6.4 oz (78.2 kg)   SpO2 94%   BMI 27.83 kg/m   Wt Readings from Last 3 Encounters:  04/23/20 172 lb 6.4 oz (78.2 kg)  01/20/20 172 lb (78 kg)  01/16/20 174 lb 1.3 oz (79 kg)    Physical Exam Vitals reviewed.  Constitutional:      General: He is not in acute distress.    Appearance: Normal appearance. He is overweight. He is not ill-appearing, toxic-appearing or diaphoretic.  HENT:     Head: Normocephalic and atraumatic.  Eyes:     General: No scleral icterus.       Right eye: No discharge.        Left eye: No discharge.     Conjunctiva/sclera: Conjunctivae normal.  Cardiovascular:     Rate and Rhythm: Normal rate and regular rhythm.     Heart sounds: Normal heart sounds. No murmur heard. No friction rub. No gallop.   Pulmonary:  Effort: Pulmonary effort is normal. No respiratory distress.      Breath sounds: Normal breath sounds. No stridor. No wheezing, rhonchi or rales.  Musculoskeletal:        General: Normal range of motion.     Cervical back: Normal range of motion.  Skin:    General: Skin is warm and dry.  Neurological:     Mental Status: He is alert and oriented to person, place, and time. Mental status is at baseline.  Psychiatric:        Mood and Affect: Mood normal.        Behavior: Behavior normal.        Thought Content: Thought content normal.        Judgment: Judgment normal.     Lab Results  Component Value Date   TSH 1.720 03/11/2019   Lab Results  Component Value Date   WBC 7.5 09/11/2019   HGB 13.1 09/11/2019   HCT 40.9 09/11/2019   MCV 87 09/11/2019   PLT 230 09/11/2019   Lab Results  Component Value Date   NA 137 11/04/2019   K 4.1 11/04/2019   CO2 25 11/04/2019   GLUCOSE 184 (H) 11/04/2019   BUN 25 (H) 11/04/2019   CREATININE 1.97 (H) 11/04/2019   BILITOT 0.3 09/11/2019   ALKPHOS 109 09/11/2019   AST 29 09/11/2019   ALT 16 09/11/2019   PROT 7.1 09/11/2019   ALBUMIN 4.0 09/11/2019   CALCIUM 9.0 11/04/2019   ANIONGAP 10 11/04/2019   Lab Results  Component Value Date   CHOL 120 09/11/2019   Lab Results  Component Value Date   HDL 21 (L) 09/11/2019   Lab Results  Component Value Date   LDLCALC 63 09/11/2019   Lab Results  Component Value Date   TRIG 218 (H) 09/11/2019   Lab Results  Component Value Date   CHOLHDL 5.7 (H) 09/11/2019   Lab Results  Component Value Date   HGBA1C 5.7 01/20/2020

## 2020-04-23 NOTE — Patient Instructions (Signed)
DASH Eating Plan DASH stands for Dietary Approaches to Stop Hypertension. The DASH eating plan is a healthy eating plan that has been shown to:  Reduce high blood pressure (hypertension).  Reduce your risk for type 2 diabetes, heart disease, and stroke.  Help with weight loss. What are tips for following this plan? Reading food labels  Check food labels for the amount of salt (sodium) per serving. Choose foods with less than 5 percent of the Daily Value of sodium. Generally, foods with less than 300 milligrams (mg) of sodium per serving fit into this eating plan.  To find whole grains, look for the word "whole" as the first word in the ingredient list. Shopping  Buy products labeled as "low-sodium" or "no salt added."  Buy fresh foods. Avoid canned foods and pre-made or frozen meals. Cooking  Avoid adding salt when cooking. Use salt-free seasonings or herbs instead of table salt or sea salt. Check with your health care provider or pharmacist before using salt substitutes.  Do not fry foods. Cook foods using healthy methods such as baking, boiling, grilling, roasting, and broiling instead.  Cook with heart-healthy oils, such as olive, canola, avocado, soybean, or sunflower oil. Meal planning  Eat a balanced diet that includes: ? 4 or more servings of fruits and 4 or more servings of vegetables each day. Try to fill one-half of your plate with fruits and vegetables. ? 6-8 servings of whole grains each day. ? Less than 6 oz (170 g) of lean meat, poultry, or fish each day. A 3-oz (85-g) serving of meat is about the same size as a deck of cards. One egg equals 1 oz (28 g). ? 2-3 servings of low-fat dairy each day. One serving is 1 cup (237 mL). ? 1 serving of nuts, seeds, or beans 5 times each week. ? 2-3 servings of heart-healthy fats. Healthy fats called omega-3 fatty acids are found in foods such as walnuts, flaxseeds, fortified milks, and eggs. These fats are also found in  cold-water fish, such as sardines, salmon, and mackerel.  Limit how much you eat of: ? Canned or prepackaged foods. ? Food that is high in trans fat, such as some fried foods. ? Food that is high in saturated fat, such as fatty meat. ? Desserts and other sweets, sugary drinks, and other foods with added sugar. ? Full-fat dairy products.  Do not salt foods before eating.  Do not eat more than 4 egg yolks a week.  Try to eat at least 2 vegetarian meals a week.  Eat more home-cooked food and less restaurant, buffet, and fast food.   Lifestyle  When eating at a restaurant, ask that your food be prepared with less salt or no salt, if possible.  If you drink alcohol: ? Limit how much you use to:  0-1 drink a day for women who are not pregnant.  0-2 drinks a day for men. ? Be aware of how much alcohol is in your drink. In the [place] one drink equals one 12 oz bottle of beer (355 mL), one 5 oz glass of wine (148 mL), or one 1 oz glass of hard liquor (44 mL). General information  Avoid eating more than 2,300 mg of salt a day. If you have hypertension, you may need to reduce your sodium intake to 1,500 mg a day.  Work with your health care provider to maintain a healthy body weight or to lose weight. Ask what an ideal weight is for you.    Get at least 30 minutes of exercise that causes your heart to beat faster (aerobic exercise) most days of the week. Activities may include walking, swimming, or biking.  Work with your health care provider or dietitian to adjust your eating plan to your individual calorie needs. What foods should I eat? Fruits All fresh, dried, or frozen fruit. Canned fruit in natural juice (without added sugar). Vegetables Fresh or frozen vegetables (raw, steamed, roasted, or grilled). Low-sodium or reduced-sodium tomato and vegetable juice. Low-sodium or reduced-sodium tomato sauce and tomato paste. Low-sodium or reduced-sodium canned vegetables. Grains Whole-grain  or whole-wheat bread. Whole-grain or whole-wheat pasta. Brown rice. Oatmeal. Quinoa. Bulgur. Whole-grain and low-sodium cereals. Pita bread. Low-fat, low-sodium crackers. Whole-wheat flour tortillas. Meats and other proteins Skinless chicken or turkey. Ground chicken or turkey. Pork with fat trimmed off. Fish and seafood. Egg whites. Dried beans, peas, or lentils. Unsalted nuts, nut butters, and seeds. Unsalted canned beans. Lean cuts of beef with fat trimmed off. Low-sodium, lean precooked or cured meat, such as sausages or meat loaves. Dairy Low-fat (1%) or fat-free (skim) milk. Reduced-fat, low-fat, or fat-free cheeses. Nonfat, low-sodium ricotta or cottage cheese. Low-fat or nonfat yogurt. Low-fat, low-sodium cheese. Fats and oils Soft margarine without trans fats. Vegetable oil. Reduced-fat, low-fat, or light mayonnaise and salad dressings (reduced-sodium). Canola, safflower, olive, avocado, soybean, and sunflower oils. Avocado. Seasonings and condiments Herbs. Spices. Seasoning mixes without salt. Other foods Unsalted popcorn and pretzels. Fat-free sweets. The items listed above may not be a complete list of foods and beverages you can eat. Contact a dietitian for more information. What foods should I avoid? Fruits Canned fruit in a light or heavy syrup. Fried fruit. Fruit in cream or butter sauce. Vegetables Creamed or fried vegetables. Vegetables in a cheese sauce. Regular canned vegetables (not low-sodium or reduced-sodium). Regular canned tomato sauce and paste (not low-sodium or reduced-sodium). Regular tomato and vegetable juice (not low-sodium or reduced-sodium). Pickles. Olives. Grains Baked goods made with fat, such as croissants, muffins, or some breads. Dry pasta or rice meal packs. Meats and other proteins Fatty cuts of meat. Ribs. Fried meat. Bacon. Bologna, salami, and other precooked or cured meats, such as sausages or meat loaves. Fat from the back of a pig (fatback).  Bratwurst. Salted nuts and seeds. Canned beans with added salt. Canned or smoked fish. Whole eggs or egg yolks. Chicken or turkey with skin. Dairy Whole or 2% milk, cream, and half-and-half. Whole or full-fat cream cheese. Whole-fat or sweetened yogurt. Full-fat cheese. Nondairy creamers. Whipped toppings. Processed cheese and cheese spreads. Fats and oils Butter. Stick margarine. Lard. Shortening. Ghee. Bacon fat. Tropical oils, such as coconut, palm kernel, or palm oil. Seasonings and condiments Onion salt, garlic salt, seasoned salt, table salt, and sea salt. Worcestershire sauce. Tartar sauce. Barbecue sauce. Teriyaki sauce. Soy sauce, including reduced-sodium. Steak sauce. Canned and packaged gravies. Fish sauce. Oyster sauce. Cocktail sauce. Store-bought horseradish. Ketchup. Mustard. Meat flavorings and tenderizers. Bouillon cubes. Hot sauces. Pre-made or packaged marinades. Pre-made or packaged taco seasonings. Relishes. Regular salad dressings. Other foods Salted popcorn and pretzels. The items listed above may not be a complete list of foods and beverages you should avoid. Contact a dietitian for more information. Where to find more information  National Heart, Lung, and Blood Institute: www.nhlbi.nih.gov  American Heart Association: www.heart.org  Academy of Nutrition and Dietetics: www.eatright.org  National Kidney Foundation: www.kidney.org Summary  The DASH eating plan is a healthy eating plan that has been shown to reduce high   blood pressure (hypertension). It may also reduce your risk for type 2 diabetes, heart disease, and stroke.  When on the DASH eating plan, aim to eat more fresh fruits and vegetables, whole grains, lean proteins, low-fat dairy, and heart-healthy fats.  With the DASH eating plan, you should limit salt (sodium) intake to 2,300 mg a day. If you have hypertension, you may need to reduce your sodium intake to 1,500 mg a day.  Work with your health care  provider or dietitian to adjust your eating plan to your individual calorie needs. This information is not intended to replace advice given to you by your health care provider. Make sure you discuss any questions you have with your health care provider. Document Revised: 02/14/2019 Document Reviewed: 02/14/2019 Elsevier Patient Education  2021 Elsevier Inc.   

## 2020-04-24 LAB — CMP14+EGFR
ALT: 13 IU/L (ref 0–44)
AST: 14 IU/L (ref 0–40)
Albumin/Globulin Ratio: 1.4 (ref 1.2–2.2)
Albumin: 4.1 g/dL (ref 3.8–4.8)
Alkaline Phosphatase: 138 IU/L — ABNORMAL HIGH (ref 44–121)
BUN/Creatinine Ratio: 6 — ABNORMAL LOW (ref 10–24)
BUN: 9 mg/dL (ref 8–27)
Bilirubin Total: 0.3 mg/dL (ref 0.0–1.2)
CO2: 27 mmol/L (ref 20–29)
Calcium: 9.1 mg/dL (ref 8.6–10.2)
Chloride: 102 mmol/L (ref 96–106)
Creatinine, Ser: 1.41 mg/dL — ABNORMAL HIGH (ref 0.76–1.27)
GFR calc Af Amer: 58 mL/min/{1.73_m2} — ABNORMAL LOW (ref >59)
GFR calc non Af Amer: 50 mL/min/{1.73_m2} — ABNORMAL LOW (ref >59)
Globulin, Total: 3 g/dL (ref 1.5–4.5)
Glucose: 115 mg/dL — ABNORMAL HIGH (ref 65–99)
Potassium: 3.8 mmol/L (ref 3.5–5.2)
Sodium: 141 mmol/L (ref 134–144)
Total Protein: 7.1 g/dL (ref 6.0–8.5)

## 2020-04-24 LAB — CBC WITH DIFFERENTIAL/PLATELET
Basophils Absolute: 0.1 10*3/uL (ref 0.0–0.2)
Basos: 1 %
EOS (ABSOLUTE): 0.5 10*3/uL — ABNORMAL HIGH (ref 0.0–0.4)
Eos: 6 %
Hematocrit: 41.6 % (ref 37.5–51.0)
Hemoglobin: 14 g/dL (ref 13.0–17.7)
Immature Grans (Abs): 0 10*3/uL (ref 0.0–0.1)
Immature Granulocytes: 0 %
Lymphocytes Absolute: 2.3 10*3/uL (ref 0.7–3.1)
Lymphs: 28 %
MCH: 28.5 pg (ref 26.6–33.0)
MCHC: 33.7 g/dL (ref 31.5–35.7)
MCV: 85 fL (ref 79–97)
Monocytes Absolute: 0.6 10*3/uL (ref 0.1–0.9)
Monocytes: 8 %
Neutrophils Absolute: 4.6 10*3/uL (ref 1.4–7.0)
Neutrophils: 57 %
Platelets: 258 10*3/uL (ref 150–450)
RBC: 4.91 x10E6/uL (ref 4.14–5.80)
RDW: 13 % (ref 11.6–15.4)
WBC: 8.2 10*3/uL (ref 3.4–10.8)

## 2020-04-24 LAB — LIPID PANEL
Chol/HDL Ratio: 4.9 ratio (ref 0.0–5.0)
Cholesterol, Total: 127 mg/dL (ref 100–199)
HDL: 26 mg/dL — ABNORMAL LOW (ref >39)
LDL Chol Calc (NIH): 73 mg/dL (ref 0–99)
Triglycerides: 163 mg/dL — ABNORMAL HIGH (ref 0–149)
VLDL Cholesterol Cal: 28 mg/dL (ref 5–40)

## 2020-04-24 LAB — MICROALBUMIN / CREATININE URINE RATIO
Creatinine, Urine: 33.4 mg/dL
Microalb/Creat Ratio: 1180 mg/g creat — ABNORMAL HIGH (ref 0–29)
Microalbumin, Urine: 394.1 ug/mL

## 2020-04-25 ENCOUNTER — Encounter: Payer: Self-pay | Admitting: Family Medicine

## 2020-04-25 DIAGNOSIS — E1121 Type 2 diabetes mellitus with diabetic nephropathy: Secondary | ICD-10-CM | POA: Insufficient documentation

## 2020-06-07 ENCOUNTER — Ambulatory Visit (INDEPENDENT_AMBULATORY_CARE_PROVIDER_SITE_OTHER): Payer: Medicare Other | Admitting: *Deleted

## 2020-06-07 DIAGNOSIS — Z0000 Encounter for general adult medical examination without abnormal findings: Secondary | ICD-10-CM

## 2020-06-07 NOTE — Patient Instructions (Signed)
  Tommy Douglas  Tommy Douglas ,  Thank you for allowing me to perform your Medicare Annual Wellness Visit and for your ongoing commitment to your health.   Health Maintenance & Immunization History Health Maintenance  Topic Date Due  . OPHTHALMOLOGY EXAM  Never done  . COVID-19 Vaccine (3 - Booster for Moderna series) 05/05/2020  . COLONOSCOPY (Pts 45-65yr Insurance coverage will need to be confirmed)  10/22/2020 (Originally [birth year]1995)  . Hepatitis C Screening  10/22/2020 (Originally [id number]  . PNA vac Low Risk Adult (2 of 2 - PPSV23) 10/22/2020 (Originally 05/27/2019)  . TETANUS/TDAP  01/19/2021 (Originally 09/08/1969)  . FOOT EXAM  09/10/2020  . HEMOGLOBIN A1C  10/21/2020  . INFLUENZA VACCINE  Completed  . HPV VACCINES  Aged Out   Immunization History  Administered Date(s) Administered  . Fluad Quad(high Dose 65+) 01/20/2020  . Influenza, High Dose Seasonal PF 01/10/2016, 02/05/2017, 01/29/2018  . Moderna Sars-Covid-2 Vaccination 10/03/2019, 11/03/2019  . Pneumococcal Conjugate-13 03/17/2015, 05/27/2018    These are the patient goals that we discussed: Goals Addressed            This Visit's Progress   . AWV       06/07/2020 AWV Goal: Diabetes Management  . Patient will maintain an A1C level below 8.0 . Patient will not develop any diabetic foot complications . Patient will not experience any hypoglycemic episodes over the next 3 months . Patient will notify our office of any CBG readings outside of the provider recommended range by calling [phone]. Patient will adhere to provider recommendations for diabetes management  Patient Self Management Activities . take all medications as prescribed and report any negative side effects . monitor and record blood sugar readings as directed . adhere to a low carbohydrate diet that incorporates lean proteins, vegetables, whole grains, low glycemic  fruits . check feet daily noting any sores, cracks, injuries, or callous formations . see PCP or podiatrist if he notices any changes in his legs, feet, or toenails . Patient will visit PCP and have an A1C level checked every 3 to 6 months as directed  . have a yearly eye exam to monitor for vascular changes associated with diabetes and will request that the report be sent to his pcp.  . consult with his PCP regarding any changes in his health or new or worsening symptoms         This is a list of Health Maintenance Items that are overdue or due now: Health Maintenance Due  Topic Date Due  . OPHTHALMOLOGY EXAM  Never done  . COVID-19 Vaccine (3 - Booster for Moderna series) 05/05/2020     Orders/Referrals Placed Today: No orders of the defined types were placed in this encounter.  (Contact our referral department at [phone] you have not spoken with someone about your referral appointment within the next 5 days)    Follow-up Plan Follow up with BHendricks Limes FNP as planned Schedule yearly eye exam Reschedule appointment with orthopedic for injection in your hip

## 2020-06-07 NOTE — Progress Notes (Signed)
MEDICARE ANNUAL WELLNESS VISIT  06/07/2020  Telephone Visit Disclaimer This Medicare AWV was conducted by telephone due to national recommendations for restrictions regarding the COVID-19 Pandemic (e.g. social distancing).  I verified, using two identifiers, that I am speaking with Tommy Douglas or their authorized healthcare agent. I discussed the limitations, risks, security, and privacy concerns of performing an evaluation and management service by telephone and the potential availability of an in-person appointment in the future. The patient expressed understanding and agreed to proceed.  Location of Patient: Home Location of Provider (nurse):  [facility]  Subjective:    Tommy Douglas is a 70 y.o. male patient of Tommy Brooklyn, FNP who had a Medicare Annual Wellness Visit today via telephone. Artez is Retired and lives with their partner. he has 7 children. he reports that he is socially active and does interact with friends/family regularly. he is not physically active and enjoys watching tv and cooking.  Patient Care Team: Tommy Brooklyn, FNP as PCP - General (Family Medicine) Lorretta Harp, MD as Consulting Physician (Cardiology) Fran Lowes, MD (Inactive) as Consulting Physician (Nephrology) Gala Romney Cristopher Estimable, MD as Consulting Physician (Gastroenterology) Lavera Guise, [facility] (Pharmacist) Josue Hector, MD as Consulting Physician (Cardiology) Jasper Loser, MD as Referring Physician (Orthopedic Surgery)  Advanced Directives 06/07/2020 06/02/2019 05/28/2019 10/22/2018 05/27/2018 05/24/2017 12/20/2016  Does Patient Have a Medical Advance Directive? Yes Yes Yes Yes Yes No -  Type of Paramedic of [place];Living will Healthcare Power of Tommy Douglas;Living will - [place];Living will  Does patient want to make changes to medical advance  directive? No - Patient declined No - Patient declined No - Patient declined - No - Patient declined - No - Patient declined  Copy of [place] in Chart? No - copy requested No - copy requested - - No - copy requested - No - copy requested  Would patient like information on creating a medical advance directive? - - - - - Yes (MAU/Ambulatory/Procedural Areas - Information given) -    Hospital Utilization Over the Past 12 Months: # of hospitalizations or ER visits: 0 # of surgeries: 0  Review of Systems    Patient reports that his overall health is worse compared to last year.  History obtained from chart review and the patient Musculoskeletal ROS: positive for - pain in hip - bilateral  Patient Reported Readings (BP, Pulse, CBG, Weight, etc) none  Pain Assessment Pain : 0-10 Pain Score: 10-Worst pain ever Pain Type: Chronic pain Pain Location: Hip Pain Orientation: Lateral Pain Descriptors / Indicators: Stabbing Pain Onset: More than a month ago Pain Frequency: Constant     Current Medications & Allergies (verified) Allergies as of 06/07/2020      Reactions   Atorvastatin    All statins make him cough   Statins Cough   Pt is currently taking pravastatin    Sulfa Antibiotics    Lexapro [escitalopram] Palpitations      Medication List       Accurate as of June 07, 2020 11:41 AM. If you have any questions, ask your nurse or doctor.        STOP taking these medications   ergocalciferol 1.25 MG (50000 UT) capsule Commonly known as: VITAMIN D2     TAKE these medications   albuterol 108 (90 Base) MCG/ACT inhaler Commonly known as: Ventolin HFA Inhale  2 puffs into the lungs every 6 (six) hours as needed for wheezing or shortness of breath.   allopurinol 100 MG tablet Commonly known as: ZYLOPRIM Take 1 tablet (100 mg total) by mouth 3 (three) times daily.   aspirin EC 81 MG tablet Take 81 mg by mouth at bedtime.   carvedilol 25 MG  tablet Commonly known as: COREG Take 1 tablet (25 mg total) by mouth 2 (two) times daily.   cetirizine 10 MG tablet Commonly known as: ZYRTEC Take 1 tablet (10 mg total) by mouth daily as needed (for allergies.).   clopidogrel 75 MG tablet Commonly known as: PLAVIX Take 1 tablet (75 mg total) by mouth at bedtime.   cyclobenzaprine 10 MG tablet Commonly known as: FLEXERIL Take 1 tablet (10 mg total) by mouth at bedtime. for muscle spams   ezetimibe 10 MG tablet Commonly known as: ZETIA Take 1 tablet (10 mg total) by mouth at bedtime.   HYDROcodone-acetaminophen 10-325 MG tablet Commonly known as: NORCO Take 1 tablet by mouth every 6 (six) hours as needed (pain.).   isosorbide mononitrate 60 MG 24 hr tablet Commonly known as: IMDUR TAKE ONE (1) TABLET EACH DAY   lisinopril 5 MG tablet Commonly known as: ZESTRIL Take 1 tablet (5 mg total) by mouth at bedtime.   multivitamin with minerals Tabs tablet Take 1 tablet by mouth at bedtime. Centrum Silver   nitroGLYCERIN 0.4 MG SL tablet Commonly known as: NITROSTAT Place 1 tablet (0.4 mg total) under the tongue every 5 (five) minutes as needed for chest pain.   pantoprazole 40 MG tablet Commonly known as: PROTONIX Take 1 tablet (40 mg total) by mouth daily at 6 (six) AM.   pravastatin 80 MG tablet Commonly known as: PRAVACHOL Take 1 tablet (80 mg total) by mouth every evening.   pregabalin 75 MG capsule Commonly known as: LYRICA Take 1 capsule (75 mg total) by mouth 3 (three) times daily. New lower RENAL DOSING   torsemide 20 MG tablet Commonly known as: DEMADEX Take 1 tablet (20 mg total) by mouth 2 (two) times daily.   UltiCare Micro Pen Needles 32G X 4 MM Misc Generic drug: Insulin Pen Needle EVERY DAY   Victoza 18 MG/3ML Sopn Generic drug: liraglutide INJECT 0.3ML (1.'8MG'$ ) SQ DAILY       History (reviewed): Past Medical History:  Diagnosis Date   Anxiety    Arthritis    "qwhere" (12/20/2016)    Chronic lower back pain    CKD (chronic kidney disease), stage IV (HCC)    Complication of anesthesia    "got the wrong kind of anesthesia ~ 2000 when they were looking down into my stomach"   Coronary artery disease    Diabetic nephropathy ([place])    Gout    Heart disease    Hyperlipidemia    Hypertension    Pneumonia ~ 2015   Seasonal allergies    Type II diabetes mellitus ([place])    Past Surgical History:  Procedure Laterality Date   ABDOMINAL AORTOGRAM W/LOWER EXTREMITY N/A 06/02/2019   Procedure: ABDOMINAL AORTOGRAM W/LOWER EXTREMITY;  Surgeon: Lorretta Harp, MD;  Location: [facility];  Service: Cardiovascular;  Laterality: N/A;   CARDIAC CATHETERIZATION  12/20/2016   CORONARY ANGIOPLASTY WITH STENT PLACEMENT  2004 X 2   "1st stent moved"   CORONARY STENT INTERVENTION N/A 12/21/2016   Procedure: CORONARY STENT INTERVENTION;  Surgeon: Burnell Blanks, MD;  Location: [facility];  Service: Cardiovascular;  Laterality:  N/A;   ESOPHAGOGASTRODUODENOSCOPY  ~ 2000   LAPAROSCOPIC CHOLECYSTECTOMY     LEFT HEART CATH AND CORONARY ANGIOGRAPHY N/A 12/20/2016   Procedure: LEFT HEART CATH AND CORONARY ANGIOGRAPHY;  Surgeon: Burnell Blanks, MD;  Location: [facility];  Service: Cardiovascular;  Laterality: N/A;   Family History  Problem Relation Age of Onset   Cancer Mother        male   Diabetes Mother    Stroke Father        x3   Stroke Brother        x2    Other Sister        bowel necrosis?    Other Daughter        gout   Other Son        gout   Alcohol abuse Brother    Other Sister        pneumonia   Other Sister        pneumonia   Cancer Daughter        male   Other Daughter        fluid on brain - disabled    Social History   Socioeconomic History   Marital status: Widowed    Spouse name: Not on file   Number of children: 7   Years of education: Not on file   Highest education level: High  school graduate  Occupational History   Occupation: retired     Comment: Designer, television/film set and Manufacturing engineer co.  Tobacco Use   Smoking status: Current Every Day Smoker    Packs/day: 0.50    Years: 44.00    Pack years: 22.00    Types: Cigarettes   Smokeless tobacco: Never Used  Scientific laboratory technician Use: Never used  Substance and Sexual Activity   Alcohol use: No   Drug use: No   Sexual activity: Not Currently  Other Topics Concern   Not on file  Social History Narrative   Not on file   Social Determinants of Health   Financial Resource Strain: Not on file  Food Insecurity: Not on file  Transportation Needs: Not on file  Physical Activity: Not on file  Stress: Not on file  Social Connections: Not on file    Activities of Daily Living In your present state of health, do you have any difficulty performing the following activities: 06/07/2020  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 N  Comment wears glasses  Walking or climbing stairs? N  Dressing or bathing? N  Doing errands, shopping? N  Preparing Food and eating ? N  Using the Toilet? N  In the past six months, have you accidently leaked urine? N  Do you have problems with loss of bowel control? N  Managing your Medications? N  Managing your Finances? N  Housekeeping or managing your Housekeeping? N  Some recent data might be hidden    Patient Education/ Literacy How often do you need to have someone help you when you read instructions, pamphlets, or other written materials from your doctor or pharmacy?: 1 - Never What is the last grade level you completed in school?: 12th  Exercise Current Exercise Habits: The patient does not participate in regular exercise at present, Exercise limited by: orthopedic condition(s)  Diet Patient reports consuming 1 meals a day and 2-3 snack(s) a day Patient reports that his primary diet is: Regular Patient reports that she does have regular access to food.  Depression Screen PHQ 2/9 Scores 06/07/2020 04/23/2020 01/20/2020 10/23/2019 09/11/2019 06/11/2019 05/28/2019  PHQ - 2 Score 0 0 0 0 0 0 0  PHQ- 9 Score - - - 1 1 - -     Fall Risk Fall Risk  06/07/2020 04/23/2020 01/20/2020 10/23/2019 09/11/2019  Falls in the past year? 0 0 0 0 0  Number falls in past yr: - - - - -  Injury with Fall? - - - - -  Risk for fall due to : - - - - -  Follow up - - - - -     Objective:  Tommy Douglas seemed alert and oriented and he participated appropriately during our telephone visit.  Blood Pressure Weight BMI  BP Readings from Last 3 Encounters:  04/23/20 (!) 172/90  01/20/20 124/73  01/16/20 (!) 172/80   Wt Readings from Last 3 Encounters:  04/23/20 172 lb 6.4 oz (78.2 kg)  01/20/20 172 lb (78 kg)  01/16/20 174 lb 1.3 oz (79 kg)   BMI Readings from Last 1 Encounters:  04/23/20 27.83 kg/m    *Unable to obtain current vital signs, weight, and BMI due to telephone visit type  Hearing/Vision   Kerby did not seem to have difficulty with hearing/understanding during the telephone conversation  Reports that he has not had a formal eye exam by an eye care professional within the past year  Reports that he has not had a formal hearing evaluation within the past year *Unable to fully assess hearing and vision during telephone visit type  Cognitive Function: 6CIT Screen 06/07/2020 05/28/2019  What Year? 0 points 0 points  What month? 0 points 0 points  What time? 0 points 0 points  Count back from 20 0 points 0 points  Months in reverse 0 points 0 points  Repeat phrase 0 points 0 points  Total Score 0 0   (Normal:0-7, Significant for Dysfunction: >8)  Normal Cognitive Function Screening: Yes   Immunization & Health Maintenance Record Immunization History  Administered Date(s) Administered   Fluad Quad(high Dose 65+) 01/20/2020   Influenza, High Dose Seasonal PF 01/10/2016, 02/05/2017, 01/29/2018   Moderna Sars-Covid-2 Vaccination  10/03/2019, 11/03/2019   Pneumococcal Conjugate-13 03/17/2015, 05/27/2018    Health Maintenance  Topic Date Due   OPHTHALMOLOGY EXAM  Never done   COVID-19 Vaccine (3 - Booster for Moderna series) 05/05/2020   COLONOSCOPY (Pts 45-70yr Insurance coverage will need to be confirmed)  10/22/2020 (Originally 09[birth year]09/1995)   Hepatitis C Screening  10/22/2020 (Originally 61952[birth year]   PNA vac Low Risk Adult (2 of 2 - PPSV23) 10/22/2020 (Originally 05/27/2019)   TETANUS/TDAP  01/19/2021 (Originally 09/08/1969)   FOOT EXAM  09/10/2020   HEMOGLOBIN A1C  10/21/2020   INFLUENZA VACCINE  Completed   HPV VACCINES  Aged Out       Assessment  This is a routine wellness examination for SHOLLIS MACEWAN  Health Maintenance: Due or Overdue Health Maintenance Due  Topic Date Due   OPHTHALMOLOGY EXAM  Never done   COVID-19 Vaccine (3 - Booster for Moderna series) 05/05/2020    SRichardo Hanksdoes not need a referral for Community Assistance: Care Management:   no Social Work:    no Prescription Assistance:  no Nutrition/Diabetes Education:  no   Plan:  Personalized Goals Goals Addressed            This Visit's Progress    AWV       06/07/2020 AWV Goal: Diabetes Management  Patient will maintain an A1C level below 8.0  Patient will not develop any diabetic foot complications  Patient will not experience any hypoglycemic episodes over the next 3 months  Patient will notify our office of any CBG readings outside of the provider recommended range by calling [phone]  Patient will adhere to provider recommendations for diabetes management  Patient Self Management Activities  take all medications as prescribed and report any negative side effects  monitor and record blood sugar readings as directed  adhere to a low carbohydrate diet that incorporates lean proteins, vegetables, whole grains, low glycemic fruits  check feet daily noting any sores, cracks, injuries,  or callous formations  see PCP or podiatrist if he notices any changes in his legs, feet, or toenails  Patient will visit PCP and have an A1C level checked every 3 to 6 months as directed   have a yearly eye exam to monitor for vascular changes associated with diabetes and will request that the report be sent to his pcp.   consult with his PCP regarding any changes in his health or new or worsening symptoms       Personalized Health Maintenance & Screening Recommendations  Pneumococcal vaccine  Td vaccine Colorectal cancer screening COVID vaccine booster, yearly eye exam  Lung Cancer Screening Recommended: Patient has recently had PET scan on 12/15/19 for lung mass  (Low Dose CT Chest recommended if Age 89-80 years, 30 pack-year currently smoking OR have quit w/in past 15 years) Hepatitis C Screening recommended: no HIV Screening recommended: no  Advanced Directives: Written information was not prepared per patient's request.  Referrals & Orders No orders of the defined types were placed in this encounter.   Follow-up Plan  Follow-up with Tommy Brooklyn, FNP as planned  Schedule yearly eye exa,  Reschedule appointment with orthopedic for injection  AVS printed and mailed to patient    I have personally reviewed and noted the following in the patients chart:    Medical and social history  Use of alcohol, tobacco or illicit drugs   Current medications and supplements  Functional ability and status  Nutritional status  Physical activity  Advanced directives  List of other physicians  Hospitalizations, surgeries, and ER visits in previous 12 months  Vitals  Screenings to include cognitive, depression, and falls  Referrals and appointments  In addition, I have reviewed and discussed with Tommy Douglas certain preventive protocols, quality metrics, and best practice recommendations. A written personalized care plan for preventive services as well as  general preventive health recommendations is available and can be mailed to the patient at his request.      Lynnea Ferrier, LPN  [phone]

## 2020-06-08 ENCOUNTER — Ambulatory Visit: Payer: Medicare Other | Admitting: Family Medicine

## 2020-06-08 ENCOUNTER — Ambulatory Visit: Payer: Medicare Other | Admitting: Cardiovascular Disease

## 2020-06-15 ENCOUNTER — Encounter: Payer: Self-pay | Admitting: Family Medicine

## 2020-06-23 ENCOUNTER — Other Ambulatory Visit: Payer: Self-pay | Admitting: Family Medicine

## 2020-06-23 DIAGNOSIS — M5136 Other intervertebral disc degeneration, lumbar region: Secondary | ICD-10-CM

## 2020-06-24 ENCOUNTER — Other Ambulatory Visit: Payer: Self-pay

## 2020-06-24 ENCOUNTER — Encounter: Payer: Self-pay | Admitting: Family Medicine

## 2020-06-24 ENCOUNTER — Ambulatory Visit (INDEPENDENT_AMBULATORY_CARE_PROVIDER_SITE_OTHER): Payer: Medicare Other | Admitting: Family Medicine

## 2020-06-24 DIAGNOSIS — I10 Essential (primary) hypertension: Secondary | ICD-10-CM | POA: Diagnosis not present

## 2020-06-24 MED ORDER — LISINOPRIL 10 MG PO TABS: 10.0000 mg | ORAL_TABLET | Freq: Every day | ORAL | 2 refills | Status: DC

## 2020-06-24 NOTE — Progress Notes (Signed)
Assessment & Plan:  1. Essential hypertension Uncontrolled. Lisinopril increased from 5 mg to 10 mg once daily. Education provided on the DASH diet.  - lisinopril (ZESTRIL) 10 MG tablet; Take 1 tablet (10 mg total) by mouth at bedtime.  Dispense: 30 tablet; Refill: 2   Return in about 6 weeks (around 08/05/2020) for HTN.  Hendricks Limes, MSN, APRN, FNP-C [facility]  Subjective:    Patient ID: Tommy Douglas, male    DOB: [birth year], 70 y.o.   MRN: [id number]  Patient Care Team: Loman Brooklyn, FNP as PCP - General (Family Medicine) Lorretta Harp, MD as Consulting Physician (Cardiology) Fran Lowes, MD (Inactive) as Consulting Physician (Nephrology) Gala Romney Cristopher Estimable, MD as Consulting Physician (Gastroenterology) Lavera Guise, [facility] (Pharmacist) Josue Hector, MD as Consulting Physician (Cardiology) Jasper Loser, MD as Referring Physician (Orthopedic Surgery)   Chief Complaint:  Chief Complaint  Patient presents with  . Hypertension    6 weeks    HPI: Tommy Douglas is a 70 y.o. male presenting on 06/24/2020 for Hypertension (6 weeks)  Patient is following up on his blood pressure, which is still elevated. He feels it is due to his pain that is not under control. He is seeing an orthopedic and has a follow-up scheduled for next week.   New complaints: None  Social history:  Relevant past medical, surgical, family and social history reviewed and updated as indicated. Interim medical history since our last visit reviewed.  Allergies and medications reviewed and updated.  DATA REVIEWED: CHART IN EPIC  ROS: Negative unless specifically indicated above in HPI.    Current Outpatient Medications:  .  albuterol (VENTOLIN HFA) 108 (90 Base) MCG/ACT inhaler, Inhale 2 puffs into the lungs every 6 (six) hours as needed for wheezing or shortness of breath., Disp: 18 g, Rfl: 5 .  allopurinol (ZYLOPRIM) 100 MG tablet, Take 1 tablet (100  mg total) by mouth 3 (three) times daily., Disp: 270 tablet, Rfl: 1 .  aspirin EC 81 MG tablet, Take 81 mg by mouth at bedtime. , Disp: , Rfl:  .  carvedilol (COREG) 25 MG tablet, Take 1 tablet (25 mg total) by mouth 2 (two) times daily., Disp: 180 tablet, Rfl: 1 .  cetirizine (ZYRTEC) 10 MG tablet, Take 1 tablet (10 mg total) by mouth daily as needed (for allergies.)., Disp: 90 tablet, Rfl: 3 .  clopidogrel (PLAVIX) 75 MG tablet, Take 1 tablet (75 mg total) by mouth at bedtime., Disp: 90 tablet, Rfl: 1 .  cyclobenzaprine (FLEXERIL) 10 MG tablet, TAKE 1 TABLET AT BEDTIME AS NEEDED FOR MUSCLE SPASMS, Disp: 90 tablet, Rfl: 1 .  ezetimibe (ZETIA) 10 MG tablet, Take 1 tablet (10 mg total) by mouth at bedtime., Disp: 90 tablet, Rfl: 1 .  HYDROcodone-acetaminophen (NORCO) 10-325 MG tablet, Take 1 tablet by mouth every 6 (six) hours as needed (pain.)., Disp: , Rfl:  .  isosorbide mononitrate (IMDUR) 60 MG 24 hr tablet, TAKE ONE (1) TABLET EACH DAY, Disp: 90 tablet, Rfl: 1 .  liraglutide (VICTOZA) 18 MG/3ML SOPN, INJECT 0.3ML (1.'8MG'$ ) SQ DAILY, Disp: 27 mL, Rfl: 2 .  lisinopril (ZESTRIL) 5 MG tablet, Take 1 tablet (5 mg total) by mouth at bedtime., Disp: 90 tablet, Rfl: 1 .  Multiple Vitamin (MULTIVITAMIN WITH MINERALS) TABS tablet, Take 1 tablet by mouth at bedtime. Centrum Silver, Disp: , Rfl:  .  nitroGLYCERIN (NITROSTAT) 0.4 MG SL tablet, Place 1 tablet (0.4 mg total) under the tongue  every 5 (five) minutes as needed for chest pain., Disp: 25 tablet, Rfl: 3 .  pantoprazole (PROTONIX) 40 MG tablet, Take 1 tablet (40 mg total) by mouth daily at 6 (six) AM., Disp: 90 tablet, Rfl: 1 .  pravastatin (PRAVACHOL) 80 MG tablet, Take 1 tablet (80 mg total) by mouth every evening., Disp: 90 tablet, Rfl: 1 .  pregabalin (LYRICA) 75 MG capsule, Take 1 capsule (75 mg total) by mouth 3 (three) times daily. New lower RENAL DOSING, Disp: 90 capsule, Rfl: 5 .  torsemide (DEMADEX) 20 MG tablet, Take 1 tablet (20 mg total)  by mouth 2 (two) times daily., Disp: 180 tablet, Rfl: 1 .  ULTICARE MICRO PEN NEEDLES 32G X 4 MM MISC, EVERY DAY, Disp: 100 each, Rfl: 3   Allergies  Allergen Reactions  . Atorvastatin     All statins make him cough  . Statins Cough    Pt is currently taking pravastatin   . Sulfa Antibiotics   . Lexapro [Escitalopram] Palpitations   Past Medical History:  Diagnosis Date  . Anxiety   . Arthritis    "qwhere" (12/20/2016)  . Chronic lower back pain   . CKD (chronic kidney disease), stage IV ([place])   . Complication of anesthesia    "got the wrong kind of anesthesia ~ 2000 when they were looking down into my stomach"  . Coronary artery disease   . Diabetic nephropathy ([place])   . Gout   . Heart disease   . Hyperlipidemia   . Hypertension   . Pneumonia ~ 2015  . Seasonal allergies   . Type II diabetes mellitus ([place])     Past Surgical History:  Procedure Laterality Date  . ABDOMINAL AORTOGRAM W/LOWER EXTREMITY N/A 06/02/2019   Procedure: ABDOMINAL AORTOGRAM W/LOWER EXTREMITY;  Surgeon: Lorretta Harp, MD;  Location: [facility];  Service: Cardiovascular;  Laterality: N/A;  . CARDIAC CATHETERIZATION  12/20/2016  . CORONARY ANGIOPLASTY WITH STENT PLACEMENT  2004 X 2   "1st stent moved"  . CORONARY STENT INTERVENTION N/A 12/21/2016   Procedure: CORONARY STENT INTERVENTION;  Surgeon: Burnell Blanks, MD;  Location: [facility];  Service: Cardiovascular;  Laterality: N/A;  . ESOPHAGOGASTRODUODENOSCOPY  ~ 2000  . LAPAROSCOPIC CHOLECYSTECTOMY    . LEFT HEART CATH AND CORONARY ANGIOGRAPHY N/A 12/20/2016   Procedure: LEFT HEART CATH AND CORONARY ANGIOGRAPHY;  Surgeon: Burnell Blanks, MD;  Location: [facility];  Service: Cardiovascular;  Laterality: N/A;    Social History   Socioeconomic History  . Marital status: Widowed    Spouse name: Not on file  . Number of children: 7  . Years of education: Not on file  . Highest education level: High school  graduate  Occupational History  . Occupation: retired     Comment: Designer, television/film set and Manufacturing engineer co.  Tobacco Use  . Smoking status: Current Every Day Smoker    Packs/day: 0.50    Years: 44.00    Pack years: 22.00    Types: Cigarettes  . Smokeless tobacco: Never Used  Vaping Use  . Vaping Use: Never used  Substance and Sexual Activity  . Alcohol use: No  . Drug use: No  . Sexual activity: Not Currently  Other Topics Concern  . Not on file  Social History Narrative  . Not on file   Social Determinants of Health   Financial Resource Strain: Not on file  Food Insecurity: Not on file  Transportation Needs: Not on  file  Physical Activity: Not on file  Stress: Not on file  Social Connections: Not on file  Intimate Partner Violence: Not on file        Objective:    BP (!) 153/89 (BP Location: Left Arm)   Pulse (!) 108   Temp 98.3 F (36.8 C) (Temporal)   Ht '5\' 6"'$  (1.676 m)   Wt 173 lb 9.6 oz (78.7 kg)   SpO2 94%   BMI 28.02 kg/m   Wt Readings from Last 3 Encounters:  06/24/20 173 lb 9.6 oz (78.7 kg)  04/23/20 172 lb 6.4 oz (78.2 kg)  01/20/20 172 lb (78 kg)    Physical Exam Vitals reviewed.  Constitutional:      General: He is not in acute distress.    Appearance: Normal appearance. He is overweight. He is not ill-appearing, toxic-appearing or diaphoretic.  HENT:     Head: Normocephalic and atraumatic.  Eyes:     General: No scleral icterus.       Right eye: No discharge.        Left eye: No discharge.     Conjunctiva/sclera: Conjunctivae normal.  Cardiovascular:     Rate and Rhythm: Normal rate and regular rhythm.     Heart sounds: Normal heart sounds. No murmur heard. No friction rub. No gallop.   Pulmonary:     Effort: Pulmonary effort is normal. No respiratory distress.     Breath sounds: Normal breath sounds. No stridor. No wheezing, rhonchi or rales.  Musculoskeletal:        General: Normal range of motion.     Cervical back: Normal  range of motion.  Skin:    General: Skin is warm and dry.  Neurological:     Mental Status: He is alert and oriented to person, place, and time. Mental status is at baseline.     Gait: Gait abnormal (ambulating with cane).  Psychiatric:        Mood and Affect: Mood normal.        Behavior: Behavior normal.        Thought Content: Thought content normal.        Judgment: Judgment normal.     Lab Results  Component Value Date   TSH 1.720 03/11/2019   Lab Results  Component Value Date   WBC 8.2 04/23/2020   HGB 14.0 04/23/2020   HCT 41.6 04/23/2020   MCV 85 04/23/2020   PLT 258 04/23/2020   Lab Results  Component Value Date   NA 141 04/23/2020   K 3.8 04/23/2020   CO2 27 04/23/2020   GLUCOSE 115 (H) 04/23/2020   BUN 9 04/23/2020   CREATININE 1.41 (H) 04/23/2020   BILITOT 0.3 04/23/2020   ALKPHOS 138 (H) 04/23/2020   AST 14 04/23/2020   ALT 13 04/23/2020   PROT 7.1 04/23/2020   ALBUMIN 4.1 04/23/2020   CALCIUM 9.1 04/23/2020   ANIONGAP 10 11/04/2019   Lab Results  Component Value Date   CHOL 127 04/23/2020   Lab Results  Component Value Date   HDL 26 (L) 04/23/2020   Lab Results  Component Value Date   LDLCALC 73 04/23/2020   Lab Results  Component Value Date   TRIG 163 (H) 04/23/2020   Lab Results  Component Value Date   CHOLHDL 4.9 04/23/2020   Lab Results  Component Value Date   HGBA1C 5.7 04/23/2020

## 2020-06-24 NOTE — Patient Instructions (Signed)
DASH Eating Plan DASH stands for Dietary Approaches to Stop Hypertension. The DASH eating plan is a healthy eating plan that has been shown to:  Reduce high blood pressure (hypertension).  Reduce your risk for type 2 diabetes, heart disease, and stroke.  Help with weight loss. What are tips for following this plan? Reading food labels  Check food labels for the amount of salt (sodium) per serving. Choose foods with less than 5 percent of the Daily Value of sodium. Generally, foods with less than 300 milligrams (mg) of sodium per serving fit into this eating plan.  To find whole grains, look for the word "whole" as the first word in the ingredient list. Shopping  Buy products labeled as "low-sodium" or "no salt added."  Buy fresh foods. Avoid canned foods and pre-made or frozen meals. Cooking  Avoid adding salt when cooking. Use salt-free seasonings or herbs instead of table salt or sea salt. Check with your health care provider or pharmacist before using salt substitutes.  Do not fry foods. Cook foods using healthy methods such as baking, boiling, grilling, roasting, and broiling instead.  Cook with heart-healthy oils, such as olive, canola, avocado, soybean, or sunflower oil. Meal planning  Eat a balanced diet that includes: ? 4 or more servings of fruits and 4 or more servings of vegetables each day. Try to fill one-half of your plate with fruits and vegetables. ? 6-8 servings of whole grains each day. ? Less than 6 oz (170 g) of lean meat, poultry, or fish each day. A 3-oz (85-g) serving of meat is about the same size as a deck of cards. One egg equals 1 oz (28 g). ? 2-3 servings of low-fat dairy each day. One serving is 1 cup (237 mL). ? 1 serving of nuts, seeds, or beans 5 times each week. ? 2-3 servings of heart-healthy fats. Healthy fats called omega-3 fatty acids are found in foods such as walnuts, flaxseeds, fortified milks, and eggs. These fats are also found in  cold-water fish, such as sardines, salmon, and mackerel.  Limit how much you eat of: ? Canned or prepackaged foods. ? Food that is high in trans fat, such as some fried foods. ? Food that is high in saturated fat, such as fatty meat. ? Desserts and other sweets, sugary drinks, and other foods with added sugar. ? Full-fat dairy products.  Do not salt foods before eating.  Do not eat more than 4 egg yolks a week.  Try to eat at least 2 vegetarian meals a week.  Eat more home-cooked food and less restaurant, buffet, and fast food.   Lifestyle  When eating at a restaurant, ask that your food be prepared with less salt or no salt, if possible.  If you drink alcohol: ? Limit how much you use to:  0-1 drink a day for women who are not pregnant.  0-2 drinks a day for men. ? Be aware of how much alcohol is in your drink. In the [place] one drink equals one 12 oz bottle of beer (355 mL), one 5 oz glass of wine (148 mL), or one 1 oz glass of hard liquor (44 mL). General information  Avoid eating more than 2,300 mg of salt a day. If you have hypertension, you may need to reduce your sodium intake to 1,500 mg a day.  Work with your health care provider to maintain a healthy body weight or to lose weight. Ask what an ideal weight is for you.    Get at least 30 minutes of exercise that causes your heart to beat faster (aerobic exercise) most days of the week. Activities may include walking, swimming, or biking.  Work with your health care provider or dietitian to adjust your eating plan to your individual calorie needs. What foods should I eat? Fruits All fresh, dried, or frozen fruit. Canned fruit in natural juice (without added sugar). Vegetables Fresh or frozen vegetables (raw, steamed, roasted, or grilled). Low-sodium or reduced-sodium tomato and vegetable juice. Low-sodium or reduced-sodium tomato sauce and tomato paste. Low-sodium or reduced-sodium canned vegetables. Grains Whole-grain  or whole-wheat bread. Whole-grain or whole-wheat pasta. Brown rice. Oatmeal. Quinoa. Bulgur. Whole-grain and low-sodium cereals. Pita bread. Low-fat, low-sodium crackers. Whole-wheat flour tortillas. Meats and other proteins Skinless chicken or turkey. Ground chicken or turkey. Pork with fat trimmed off. Fish and seafood. Egg whites. Dried beans, peas, or lentils. Unsalted nuts, nut butters, and seeds. Unsalted canned beans. Lean cuts of beef with fat trimmed off. Low-sodium, lean precooked or cured meat, such as sausages or meat loaves. Dairy Low-fat (1%) or fat-free (skim) milk. Reduced-fat, low-fat, or fat-free cheeses. Nonfat, low-sodium ricotta or cottage cheese. Low-fat or nonfat yogurt. Low-fat, low-sodium cheese. Fats and oils Soft margarine without trans fats. Vegetable oil. Reduced-fat, low-fat, or light mayonnaise and salad dressings (reduced-sodium). Canola, safflower, olive, avocado, soybean, and sunflower oils. Avocado. Seasonings and condiments Herbs. Spices. Seasoning mixes without salt. Other foods Unsalted popcorn and pretzels. Fat-free sweets. The items listed above may not be a complete list of foods and beverages you can eat. Contact a dietitian for more information. What foods should I avoid? Fruits Canned fruit in a light or heavy syrup. Fried fruit. Fruit in cream or butter sauce. Vegetables Creamed or fried vegetables. Vegetables in a cheese sauce. Regular canned vegetables (not low-sodium or reduced-sodium). Regular canned tomato sauce and paste (not low-sodium or reduced-sodium). Regular tomato and vegetable juice (not low-sodium or reduced-sodium). Pickles. Olives. Grains Baked goods made with fat, such as croissants, muffins, or some breads. Dry pasta or rice meal packs. Meats and other proteins Fatty cuts of meat. Ribs. Fried meat. Bacon. Bologna, salami, and other precooked or cured meats, such as sausages or meat loaves. Fat from the back of a pig (fatback).  Bratwurst. Salted nuts and seeds. Canned beans with added salt. Canned or smoked fish. Whole eggs or egg yolks. Chicken or turkey with skin. Dairy Whole or 2% milk, cream, and half-and-half. Whole or full-fat cream cheese. Whole-fat or sweetened yogurt. Full-fat cheese. Nondairy creamers. Whipped toppings. Processed cheese and cheese spreads. Fats and oils Butter. Stick margarine. Lard. Shortening. Ghee. Bacon fat. Tropical oils, such as coconut, palm kernel, or palm oil. Seasonings and condiments Onion salt, garlic salt, seasoned salt, table salt, and sea salt. Worcestershire sauce. Tartar sauce. Barbecue sauce. Teriyaki sauce. Soy sauce, including reduced-sodium. Steak sauce. Canned and packaged gravies. Fish sauce. Oyster sauce. Cocktail sauce. Store-bought horseradish. Ketchup. Mustard. Meat flavorings and tenderizers. Bouillon cubes. Hot sauces. Pre-made or packaged marinades. Pre-made or packaged taco seasonings. Relishes. Regular salad dressings. Other foods Salted popcorn and pretzels. The items listed above may not be a complete list of foods and beverages you should avoid. Contact a dietitian for more information. Where to find more information  National Heart, Lung, and Blood Institute: www.nhlbi.nih.gov  American Heart Association: www.heart.org  Academy of Nutrition and Dietetics: www.eatright.org  National Kidney Foundation: www.kidney.org Summary  The DASH eating plan is a healthy eating plan that has been shown to reduce high   blood pressure (hypertension). It may also reduce your risk for type 2 diabetes, heart disease, and stroke.  When on the DASH eating plan, aim to eat more fresh fruits and vegetables, whole grains, lean proteins, low-fat dairy, and heart-healthy fats.  With the DASH eating plan, you should limit salt (sodium) intake to 2,300 mg a day. If you have hypertension, you may need to reduce your sodium intake to 1,500 mg a day.  Work with your health care  provider or dietitian to adjust your eating plan to your individual calorie needs. This information is not intended to replace advice given to you by your health care provider. Make sure you discuss any questions you have with your health care provider. Document Revised: 02/14/2019 Document Reviewed: 02/14/2019 Elsevier Patient Education  2021 Elsevier Inc.   

## 2020-08-06 ENCOUNTER — Ambulatory Visit (INDEPENDENT_AMBULATORY_CARE_PROVIDER_SITE_OTHER): Payer: Medicare PPO | Admitting: Family Medicine

## 2020-08-06 ENCOUNTER — Encounter: Payer: Self-pay | Admitting: Family Medicine

## 2020-08-06 ENCOUNTER — Other Ambulatory Visit: Payer: Self-pay

## 2020-08-06 VITALS — BP 118/69 | HR 97 | Temp 97.4°F | Ht 66.0 in | Wt 166.4 lb

## 2020-08-06 DIAGNOSIS — E785 Hyperlipidemia, unspecified: Secondary | ICD-10-CM | POA: Diagnosis not present

## 2020-08-06 DIAGNOSIS — E1169 Type 2 diabetes mellitus with other specified complication: Secondary | ICD-10-CM | POA: Diagnosis not present

## 2020-08-06 DIAGNOSIS — K219 Gastro-esophageal reflux disease without esophagitis: Secondary | ICD-10-CM

## 2020-08-06 DIAGNOSIS — I2510 Atherosclerotic heart disease of native coronary artery without angina pectoris: Secondary | ICD-10-CM

## 2020-08-06 DIAGNOSIS — Z9861 Coronary angioplasty status: Secondary | ICD-10-CM

## 2020-08-06 DIAGNOSIS — I10 Essential (primary) hypertension: Secondary | ICD-10-CM

## 2020-08-06 MED ORDER — PANTOPRAZOLE SODIUM 40 MG PO TBEC: 40.0000 mg | DELAYED_RELEASE_TABLET | Freq: Every day | ORAL | 1 refills | Status: DC

## 2020-08-06 MED ORDER — PRAVASTATIN SODIUM 80 MG PO TABS: 80.0000 mg | ORAL_TABLET | Freq: Every evening | ORAL | 1 refills | Status: DC

## 2020-08-06 MED ORDER — CLOPIDOGREL BISULFATE 75 MG PO TABS: 75.0000 mg | ORAL_TABLET | Freq: Every day | ORAL | 1 refills | Status: DC

## 2020-08-06 MED ORDER — EZETIMIBE 10 MG PO TABS: 10.0000 mg | ORAL_TABLET | Freq: Every day | ORAL | 1 refills | Status: DC

## 2020-08-06 MED ORDER — ISOSORBIDE MONONITRATE ER 60 MG PO TB24: 60.0000 mg | ORAL_TABLET | Freq: Every day | ORAL | 1 refills | Status: DC

## 2020-08-06 MED ORDER — CARVEDILOL 25 MG PO TABS: 25.0000 mg | ORAL_TABLET | Freq: Two times a day (BID) | ORAL | 1 refills | Status: DC

## 2020-08-06 MED ORDER — TORSEMIDE 20 MG PO TABS: 20.0000 mg | ORAL_TABLET | Freq: Two times a day (BID) | ORAL | 1 refills | Status: DC

## 2020-08-06 NOTE — Progress Notes (Signed)
Assessment & Plan:  1. Essential hypertension Well controlled on current regimen.    Return in about 2 months (around 10/06/2020) for follow-up of chronic medication conditions.  Hendricks Limes, MSN, APRN, FNP-C [facility]  Subjective:    Patient ID: Tommy Douglas, male    DOB: [birth year], 70 y.o.   MRN: [id number]  Patient Care Team: Loman Brooklyn, FNP as PCP - General (Family Medicine) Lorretta Harp, MD as Consulting Physician (Cardiology) Fran Lowes, MD (Inactive) as Consulting Physician (Nephrology) Gala Romney Cristopher Estimable, MD as Consulting Physician (Gastroenterology) Lavera Guise, [facility] (Pharmacist) Josue Hector, MD as Consulting Physician (Cardiology) Jasper Loser, MD as Referring Physician (Orthopedic Surgery)   Chief Complaint:  Chief Complaint  Patient presents with  . Hypertension    6 week follow up    HPI: Tommy Douglas is a 70 y.o. male presenting on 08/06/2020 for Hypertension (6 week follow up)  Patient is following up on hypertension.  At our last visit his lisinopril was increased from 5 mg to 10 mg once daily.  He reports he is checking his blood pressure at home and is getting good readings.  New complaints: None  Social history:  Relevant past medical, surgical, family and social history reviewed and updated as indicated. Interim medical history since our last visit reviewed.  Allergies and medications reviewed and updated.  DATA REVIEWED: CHART IN EPIC  ROS: Negative unless specifically indicated above in HPI.    Current Outpatient Medications:  .  albuterol (VENTOLIN HFA) 108 (90 Base) MCG/ACT inhaler, Inhale 2 puffs into the lungs every 6 (six) hours as needed for wheezing or shortness of breath., Disp: 18 g, Rfl: 5 .  allopurinol (ZYLOPRIM) 100 MG tablet, Take 1 tablet (100 mg total) by mouth 3 (three) times daily., Disp: 270 tablet, Rfl: 1 .  aspirin EC 81 MG tablet, Take 81 mg by mouth at bedtime.  , Disp: , Rfl:  .  carvedilol (COREG) 25 MG tablet, Take 1 tablet (25 mg total) by mouth 2 (two) times daily., Disp: 180 tablet, Rfl: 1 .  cetirizine (ZYRTEC) 10 MG tablet, Take 1 tablet (10 mg total) by mouth daily as needed (for allergies.)., Disp: 90 tablet, Rfl: 3 .  clopidogrel (PLAVIX) 75 MG tablet, Take 1 tablet (75 mg total) by mouth at bedtime., Disp: 90 tablet, Rfl: 1 .  cyclobenzaprine (FLEXERIL) 10 MG tablet, TAKE 1 TABLET AT BEDTIME AS NEEDED FOR MUSCLE SPASMS, Disp: 90 tablet, Rfl: 1 .  ezetimibe (ZETIA) 10 MG tablet, Take 1 tablet (10 mg total) by mouth at bedtime., Disp: 90 tablet, Rfl: 1 .  HYDROcodone-acetaminophen (NORCO) 10-325 MG tablet, Take 1 tablet by mouth every 6 (six) hours as needed (pain.)., Disp: , Rfl:  .  isosorbide mononitrate (IMDUR) 60 MG 24 hr tablet, TAKE ONE (1) TABLET EACH DAY, Disp: 90 tablet, Rfl: 1 .  liraglutide (VICTOZA) 18 MG/3ML SOPN, INJECT 0.3ML (1.'8MG'$ ) SQ DAILY, Disp: 27 mL, Rfl: 2 .  lisinopril (ZESTRIL) 10 MG tablet, Take 1 tablet (10 mg total) by mouth at bedtime., Disp: 30 tablet, Rfl: 2 .  Multiple Vitamin (MULTIVITAMIN WITH MINERALS) TABS tablet, Take 1 tablet by mouth at bedtime. Centrum Silver, Disp: , Rfl:  .  nitroGLYCERIN (NITROSTAT) 0.4 MG SL tablet, Place 1 tablet (0.4 mg total) under the tongue every 5 (five) minutes as needed for chest pain., Disp: 25 tablet, Rfl: 3 .  pantoprazole (PROTONIX) 40 MG tablet, Take 1 tablet (40 mg  total) by mouth daily at 6 (six) AM., Disp: 90 tablet, Rfl: 1 .  pravastatin (PRAVACHOL) 80 MG tablet, Take 1 tablet (80 mg total) by mouth every evening., Disp: 90 tablet, Rfl: 1 .  pregabalin (LYRICA) 75 MG capsule, Take 1 capsule (75 mg total) by mouth 3 (three) times daily. New lower RENAL DOSING, Disp: 90 capsule, Rfl: 5 .  torsemide (DEMADEX) 20 MG tablet, Take 1 tablet (20 mg total) by mouth 2 (two) times daily., Disp: 180 tablet, Rfl: 1 .  ULTICARE MICRO PEN NEEDLES 32G X 4 MM MISC, EVERY DAY, Disp: 100  each, Rfl: 3   Allergies  Allergen Reactions  . Atorvastatin     All statins make him cough  . Statins Cough    Pt is currently taking pravastatin   . Sulfa Antibiotics   . Lexapro [Escitalopram] Palpitations   Past Medical History:  Diagnosis Date  . Anxiety   . Arthritis    "qwhere" (12/20/2016)  . Chronic lower back pain   . CKD (chronic kidney disease), stage IV ([place])   . Complication of anesthesia    "got the wrong kind of anesthesia ~ 2000 when they were looking down into my stomach"  . Coronary artery disease   . Diabetic nephropathy ([place])   . Gout   . Heart disease   . Hyperlipidemia   . Hypertension   . Pneumonia ~ 2015  . Seasonal allergies   . Type II diabetes mellitus ([place])     Past Surgical History:  Procedure Laterality Date  . ABDOMINAL AORTOGRAM W/LOWER EXTREMITY N/A 06/02/2019   Procedure: ABDOMINAL AORTOGRAM W/LOWER EXTREMITY;  Surgeon: Lorretta Harp, MD;  Location: [facility];  Service: Cardiovascular;  Laterality: N/A;  . CARDIAC CATHETERIZATION  12/20/2016  . CORONARY ANGIOPLASTY WITH STENT PLACEMENT  2004 X 2   "1st stent moved"  . CORONARY STENT INTERVENTION N/A 12/21/2016   Procedure: CORONARY STENT INTERVENTION;  Surgeon: Burnell Blanks, MD;  Location: [facility];  Service: Cardiovascular;  Laterality: N/A;  . ESOPHAGOGASTRODUODENOSCOPY  ~ 2000  . LAPAROSCOPIC CHOLECYSTECTOMY    . LEFT HEART CATH AND CORONARY ANGIOGRAPHY N/A 12/20/2016   Procedure: LEFT HEART CATH AND CORONARY ANGIOGRAPHY;  Surgeon: Burnell Blanks, MD;  Location: [facility];  Service: Cardiovascular;  Laterality: N/A;    Social History   Socioeconomic History  . Marital status: Widowed    Spouse name: Not on file  . Number of children: 7  . Years of education: Not on file  . Highest education level: High school graduate  Occupational History  . Occupation: retired     Comment: Designer, television/film set and Manufacturing engineer co.  Tobacco Use   . Smoking status: Current Every Day Smoker    Packs/day: 0.50    Years: 44.00    Pack years: 22.00    Types: Cigarettes  . Smokeless tobacco: Never Used  Vaping Use  . Vaping Use: Never used  Substance and Sexual Activity  . Alcohol use: No  . Drug use: No  . Sexual activity: Not Currently  Other Topics Concern  . Not on file  Social History Narrative  . Not on file   Social Determinants of Health   Financial Resource Strain: Not on file  Food Insecurity: Not on file  Transportation Needs: Not on file  Physical Activity: Not on file  Stress: Not on file  Social Connections: Not on file  Intimate Partner Violence: Not on file  Objective:    BP 118/69   Pulse 97   Temp (!) 97.4 F (36.3 C) (Temporal)   Ht '5\' 6"'$  (1.676 m)   Wt 166 lb 6.4 oz (75.5 kg)   SpO2 96%   BMI 26.86 kg/m   Wt Readings from Last 3 Encounters:  08/06/20 166 lb 6.4 oz (75.5 kg)  06/24/20 173 lb 9.6 oz (78.7 kg)  04/23/20 172 lb 6.4 oz (78.2 kg)    Physical Exam Vitals reviewed.  Constitutional:      General: He is not in acute distress.    Appearance: Normal appearance. He is overweight. He is not ill-appearing, toxic-appearing or diaphoretic.  HENT:     Head: Normocephalic and atraumatic.  Eyes:     General: No scleral icterus.       Right eye: No discharge.        Left eye: No discharge.     Conjunctiva/sclera: Conjunctivae normal.  Cardiovascular:     Rate and Rhythm: Normal rate and regular rhythm.     Heart sounds: Normal heart sounds. No murmur heard. No friction rub. No gallop.   Pulmonary:     Effort: Pulmonary effort is normal. No respiratory distress.     Breath sounds: Normal breath sounds. No stridor. No wheezing, rhonchi or rales.  Musculoskeletal:        General: Normal range of motion.     Cervical back: Normal range of motion.  Skin:    General: Skin is warm and dry.  Neurological:     Mental Status: He is alert and oriented to person, place, and time.  Mental status is at baseline.     Gait: Gait abnormal (ambulates with cane).  Psychiatric:        Mood and Affect: Mood normal.        Behavior: Behavior normal.        Thought Content: Thought content normal.        Judgment: Judgment normal.     Lab Results  Component Value Date   TSH 1.720 03/11/2019   Lab Results  Component Value Date   WBC 8.2 04/23/2020   HGB 14.0 04/23/2020   HCT 41.6 04/23/2020   MCV 85 04/23/2020   PLT 258 04/23/2020   Lab Results  Component Value Date   NA 141 04/23/2020   K 3.8 04/23/2020   CO2 27 04/23/2020   GLUCOSE 115 (H) 04/23/2020   BUN 9 04/23/2020   CREATININE 1.41 (H) 04/23/2020   BILITOT 0.3 04/23/2020   ALKPHOS 138 (H) 04/23/2020   AST 14 04/23/2020   ALT 13 04/23/2020   PROT 7.1 04/23/2020   ALBUMIN 4.1 04/23/2020   CALCIUM 9.1 04/23/2020   ANIONGAP 10 11/04/2019   Lab Results  Component Value Date   CHOL 127 04/23/2020   Lab Results  Component Value Date   HDL 26 (L) 04/23/2020   Lab Results  Component Value Date   LDLCALC 73 04/23/2020   Lab Results  Component Value Date   TRIG 163 (H) 04/23/2020   Lab Results  Component Value Date   CHOLHDL 4.9 04/23/2020   Lab Results  Component Value Date   HGBA1C 5.7 04/23/2020

## 2020-08-10 DIAGNOSIS — Z79899 Other long term (current) drug therapy: Secondary | ICD-10-CM | POA: Diagnosis not present

## 2020-08-10 DIAGNOSIS — F17210 Nicotine dependence, cigarettes, uncomplicated: Secondary | ICD-10-CM | POA: Diagnosis not present

## 2020-08-10 DIAGNOSIS — M5136 Other intervertebral disc degeneration, lumbar region: Secondary | ICD-10-CM | POA: Diagnosis not present

## 2020-08-10 DIAGNOSIS — I10 Essential (primary) hypertension: Secondary | ICD-10-CM | POA: Diagnosis not present

## 2020-08-10 DIAGNOSIS — N183 Chronic kidney disease, stage 3 unspecified: Secondary | ICD-10-CM | POA: Diagnosis not present

## 2020-09-22 DIAGNOSIS — M5136 Other intervertebral disc degeneration, lumbar region: Secondary | ICD-10-CM | POA: Diagnosis not present

## 2020-09-22 DIAGNOSIS — Z79899 Other long term (current) drug therapy: Secondary | ICD-10-CM | POA: Diagnosis not present

## 2020-09-22 DIAGNOSIS — Z1159 Encounter for screening for other viral diseases: Secondary | ICD-10-CM | POA: Diagnosis not present

## 2020-09-22 DIAGNOSIS — F17210 Nicotine dependence, cigarettes, uncomplicated: Secondary | ICD-10-CM | POA: Diagnosis not present

## 2020-09-22 DIAGNOSIS — N183 Chronic kidney disease, stage 3 unspecified: Secondary | ICD-10-CM | POA: Diagnosis not present

## 2020-09-22 DIAGNOSIS — I10 Essential (primary) hypertension: Secondary | ICD-10-CM | POA: Diagnosis not present

## 2020-10-06 ENCOUNTER — Encounter: Payer: Self-pay | Admitting: Family Medicine

## 2020-10-06 ENCOUNTER — Other Ambulatory Visit: Payer: Self-pay

## 2020-10-06 ENCOUNTER — Ambulatory Visit (HOSPITAL_COMMUNITY)
Admission: RE | Admit: 2020-10-06 | Discharge: 2020-10-06 | Disposition: A | Discharge status: Home / Self Care | Payer: Medicare PPO | Source: Ambulatory Visit | Attending: Family Medicine | Admitting: Family Medicine

## 2020-10-06 ENCOUNTER — Ambulatory Visit (INDEPENDENT_AMBULATORY_CARE_PROVIDER_SITE_OTHER): Payer: Medicare PPO | Admitting: Family Medicine

## 2020-10-06 ENCOUNTER — Ambulatory Visit (INDEPENDENT_AMBULATORY_CARE_PROVIDER_SITE_OTHER): Payer: Medicare PPO

## 2020-10-06 VITALS — BP 170/87 | HR 96 | Temp 98.1°F | Ht 66.0 in | Wt 167.0 lb

## 2020-10-06 DIAGNOSIS — I10 Essential (primary) hypertension: Secondary | ICD-10-CM | POA: Diagnosis not present

## 2020-10-06 DIAGNOSIS — Z1211 Encounter for screening for malignant neoplasm of colon: Secondary | ICD-10-CM

## 2020-10-06 DIAGNOSIS — M25552 Pain in left hip: Secondary | ICD-10-CM | POA: Diagnosis not present

## 2020-10-06 DIAGNOSIS — N182 Chronic kidney disease, stage 2 (mild): Secondary | ICD-10-CM | POA: Diagnosis not present

## 2020-10-06 DIAGNOSIS — M7989 Other specified soft tissue disorders: Secondary | ICD-10-CM | POA: Insufficient documentation

## 2020-10-06 DIAGNOSIS — E1122 Type 2 diabetes mellitus with diabetic chronic kidney disease: Secondary | ICD-10-CM | POA: Diagnosis not present

## 2020-10-06 DIAGNOSIS — M1A9XX0 Chronic gout, unspecified, without tophus (tophi): Secondary | ICD-10-CM

## 2020-10-06 LAB — BAYER DCA HB A1C WAIVED: HB A1C (BAYER DCA - WAIVED): 5.3 % (ref <7.0)

## 2020-10-06 MED ORDER — ALLOPURINOL 100 MG PO TABS: 100.0000 mg | ORAL_TABLET | Freq: Three times a day (TID) | ORAL | 1 refills | Status: DC

## 2020-10-06 MED ORDER — LISINOPRIL 10 MG PO TABS: 10.0000 mg | ORAL_TABLET | Freq: Every day | ORAL | 1 refills | Status: DC

## 2020-10-06 NOTE — Progress Notes (Signed)
Assessment & Plan:  1. Type 2 diabetes mellitus with stage 2 chronic kidney disease, without long-term current use of insulin (HCC) Lab Results  Component Value Date   HGBA1C 5.3 10/06/2020   HGBA1C 5.7 04/23/2020   HGBA1C 5.7 01/20/2020    - Diabetes is at goal of A1c < 7. - Medications: continue current medications - Home glucose monitoring: Continue monitoring - Patient is currently taking a statin. Patient is taking an ACE-inhibitor/ARB.  - Reminded to schedule diabetic eye exam. Discussed foot care.  Diabetes Health Maintenance Due  Topic Date Due   OPHTHALMOLOGY EXAM  08/26/2020   FOOT EXAM  09/10/2020   HEMOGLOBIN A1C  10/21/2020    - CBC with Differential/Platelet - CMP14+EGFR - Lipid panel - Bayer DCA Hb A1c Waived  2. Essential hypertension Elevated today due to pain. - Lipid panel - CBC with Differential/Platelet - CMP14+EGFR - lisinopril (ZESTRIL) 10 MG tablet; Take 1 tablet (10 mg total) by mouth at bedtime.  Dispense: 90 tablet; Refill: 1  3. Chronic gout without tophus, unspecified cause, unspecified site - Uric acid level added to lab work - allopurinol (ZYLOPRIM) 100 MG tablet; Take 1 tablet (100 mg total) by mouth 3 (three) times daily.  Dispense: 270 tablet; Refill: 1  4. Left leg swelling - US Venous Img Lower Unilateral Left; Future  5. Left hip pain - DG HIP UNILAT W OR W/O PELVIS 2-3 VIEWS LEFT  6. Colon cancer screening - Ambulatory referral to Gastroenterology   Return as directed after imaging results.  Hendricks Limes, MSN, APRN, FNP-C [facility]  Subjective:    Patient ID: Tommy Douglas, male    DOB: [birth year], 70 y.o.   MRN: [id number]  Patient Care Team: Loman Brooklyn, FNP as PCP - General (Family Medicine) Lorretta Harp, MD as Consulting Physician (Cardiology) Fran Lowes, MD (Inactive) as Consulting Physician (Nephrology) Gala Romney Cristopher Estimable, MD as Consulting Physician  (Gastroenterology) Lavera Guise, [facility] (Pharmacist) Josue Hector, MD as Consulting Physician (Cardiology) Jasper Loser, MD as Referring Physician (Orthopedic Surgery)   Chief Complaint:  Chief Complaint  Patient presents with   Hypertension   Hyperlipidemia    2 month follow up of chronic medical conditions    Foot Swelling    Bilateral- patient states it has been going on a few weeks     HPI: Tommy Douglas is a 70 y.o. male presenting on 10/06/2020 for Hypertension, Hyperlipidemia (2 month follow up of chronic medical conditions ), and Foot Swelling (Bilateral- patient states it has been going on a few weeks )  Diabetes: Patient presents for follow up of diabetes. Current symptoms include: none. Known diabetic complications: nephropathy. Medication compliance: Yes. Current diet: in general, a "healthy" diet  . Current exercise: none. Home blood sugar records: BGs are running  consistent with Hgb A1C. Is he  on ACE inhibitor or angiotensin II receptor blocker? Yes. Is he on a statin? Yes.   Hypertension: Patient reports his blood pressure at home is high as well.  He feels it is due to the amount of pain he is in.  New complaints: Patient reports his left leg/foot started swelling 2-3 weeks ago. He does not eat a lot of salt. This is new for him. No swelling of right leg.    Social history:  Relevant past medical, surgical, family and social history reviewed and updated as indicated. Interim medical history since our last visit reviewed.  Allergies and medications reviewed and  updated.  DATA REVIEWED: CHART IN EPIC  ROS: Negative unless specifically indicated above in HPI.    Current Outpatient Medications:    albuterol (VENTOLIN HFA) 108 (90 Base) MCG/ACT inhaler, Inhale 2 puffs into the lungs every 6 (six) hours as needed for wheezing or shortness of breath., Disp: 18 g, Rfl: 5   allopurinol (ZYLOPRIM) 100 MG tablet, Take 1 tablet (100 mg total) by mouth 3 (three)  times daily., Disp: 270 tablet, Rfl: 1   aspirin EC 81 MG tablet, Take 81 mg by mouth at bedtime. , Disp: , Rfl:    carvedilol (COREG) 25 MG tablet, Take 1 tablet (25 mg total) by mouth 2 (two) times daily., Disp: 180 tablet, Rfl: 1   cetirizine (ZYRTEC) 10 MG tablet, Take 1 tablet (10 mg total) by mouth daily as needed (for allergies.)., Disp: 90 tablet, Rfl: 3   clopidogrel (PLAVIX) 75 MG tablet, Take 1 tablet (75 mg total) by mouth at bedtime., Disp: 90 tablet, Rfl: 1   cyclobenzaprine (FLEXERIL) 10 MG tablet, TAKE 1 TABLET AT BEDTIME AS NEEDED FOR MUSCLE SPASMS, Disp: 90 tablet, Rfl: 1   ezetimibe (ZETIA) 10 MG tablet, Take 1 tablet (10 mg total) by mouth at bedtime., Disp: 90 tablet, Rfl: 1   HYDROcodone-acetaminophen (NORCO) 10-325 MG tablet, Take 1 tablet by mouth every 6 (six) hours as needed (pain.)., Disp: , Rfl:    isosorbide mononitrate (IMDUR) 60 MG 24 hr tablet, Take 1 tablet (60 mg total) by mouth daily., Disp: 90 tablet, Rfl: 1   liraglutide (VICTOZA) 18 MG/3ML SOPN, INJECT 0.3ML (1.8MG) SQ DAILY, Disp: 27 mL, Rfl: 2   lisinopril (ZESTRIL) 10 MG tablet, Take 1 tablet (10 mg total) by mouth at bedtime., Disp: 30 tablet, Rfl: 2   Multiple Vitamin (MULTIVITAMIN WITH MINERALS) TABS tablet, Take 1 tablet by mouth at bedtime. Centrum Silver, Disp: , Rfl:    nitroGLYCERIN (NITROSTAT) 0.4 MG SL tablet, Place 1 tablet (0.4 mg total) under the tongue every 5 (five) minutes as needed for chest pain., Disp: 25 tablet, Rfl: 3   pantoprazole (PROTONIX) 40 MG tablet, Take 1 tablet (40 mg total) by mouth daily at 6 (six) AM., Disp: 90 tablet, Rfl: 1   pravastatin (PRAVACHOL) 80 MG tablet, Take 1 tablet (80 mg total) by mouth every evening., Disp: 90 tablet, Rfl: 1   pregabalin (LYRICA) 75 MG capsule, Take 1 capsule (75 mg total) by mouth 3 (three) times daily. New lower RENAL DOSING, Disp: 90 capsule, Rfl: 5   torsemide (DEMADEX) 20 MG tablet, Take 1 tablet (20 mg total) by mouth 2 (two) times  daily., Disp: 180 tablet, Rfl: 1   ULTICARE MICRO PEN NEEDLES 32G X 4 MM MISC, EVERY DAY, Disp: 100 each, Rfl: 3   Allergies  Allergen Reactions   Atorvastatin     All statins make him cough   Statins Cough    Pt is currently taking pravastatin    Sulfa Antibiotics    Lexapro [Escitalopram] Palpitations   Past Medical History:  Diagnosis Date   Anxiety    Arthritis    "qwhere" (12/20/2016)   Chronic lower back pain    CKD (chronic kidney disease), stage IV (HCC)    Complication of anesthesia    "got the wrong kind of anesthesia ~ 2000 when they were looking down into my stomach"   Coronary artery disease    Diabetic nephropathy ([place])    Gout    Heart disease    Hyperlipidemia  Hypertension    Pneumonia ~ 2015   Seasonal allergies    Type II diabetes mellitus ([place])     Past Surgical History:  Procedure Laterality Date   ABDOMINAL AORTOGRAM W/LOWER EXTREMITY N/A 06/02/2019   Procedure: ABDOMINAL AORTOGRAM W/LOWER EXTREMITY;  Surgeon: Lorretta Harp, MD;  Location: [facility];  Service: Cardiovascular;  Laterality: N/A;   CARDIAC CATHETERIZATION  12/20/2016   CORONARY ANGIOPLASTY WITH STENT PLACEMENT  2004 X 2   "1st stent moved"   CORONARY STENT INTERVENTION N/A 12/21/2016   Procedure: CORONARY STENT INTERVENTION;  Surgeon: Burnell Blanks, MD;  Location: [facility];  Service: Cardiovascular;  Laterality: N/A;   ESOPHAGOGASTRODUODENOSCOPY  ~ 2000   LAPAROSCOPIC CHOLECYSTECTOMY     LEFT HEART CATH AND CORONARY ANGIOGRAPHY N/A 12/20/2016   Procedure: LEFT HEART CATH AND CORONARY ANGIOGRAPHY;  Surgeon: Burnell Blanks, MD;  Location: [place];  Service: Cardiovascular;  Laterality: N/A;    Social History   Socioeconomic History   Marital status: Widowed    Spouse name: Not on file   Number of children: 7   Years of education: Not on file   Highest education level: High school graduate  Occupational History   Occupation:  retired     Comment: Designer, television/film set and Manufacturing engineer co.  Tobacco Use   Smoking status: Every Day    Packs/day: 0.50    Years: 44.00    Pack years: 22.00    Types: Cigarettes   Smokeless tobacco: Never  Vaping Use   Vaping Use: Never used  Substance and Sexual Activity   Alcohol use: No   Drug use: No   Sexual activity: Not Currently  Other Topics Concern   Not on file  Social History Narrative   Not on file   Social Determinants of Health   Financial Resource Strain: Not on file  Food Insecurity: Not on file  Transportation Needs: Not on file  Physical Activity: Not on file  Stress: Not on file  Social Connections: Not on file  Intimate Partner Violence: Not on file        Objective:    BP (!) 170/87   Pulse 96   Temp 98.1 F (36.7 C) (Temporal)   Ht 5' 6"  (1.676 m)   Wt 167 lb (75.8 kg)   SpO2 97%   BMI 26.95 kg/m   Wt Readings from Last 3 Encounters:  10/06/20 167 lb (75.8 kg)  08/06/20 166 lb 6.4 oz (75.5 kg)  06/24/20 173 lb 9.6 oz (78.7 kg)    Physical Exam Vitals reviewed.  Constitutional:      General: He is not in acute distress.    Appearance: Normal appearance. He is overweight. He is not ill-appearing, toxic-appearing or diaphoretic.  HENT:     Head: Normocephalic and atraumatic.  Eyes:     General: No scleral icterus.       Right eye: No discharge.        Left eye: No discharge.     Conjunctiva/sclera: Conjunctivae normal.  Cardiovascular:     Rate and Rhythm: Normal rate and regular rhythm.     Heart sounds: Normal heart sounds. No murmur heard.   No friction rub. No gallop.  Pulmonary:     Effort: Pulmonary effort is normal. No respiratory distress.     Breath sounds: Normal breath sounds. No stridor. No wheezing, rhonchi or rales.  Musculoskeletal:        General: Normal range  of motion.     Cervical back: Normal range of motion.     Left hip: Tenderness (with internal rotation) present.     Right lower leg: No edema.      Left lower leg: 2+ Edema present.  Skin:    General: Skin is warm and dry.  Neurological:     Mental Status: He is alert and oriented to person, place, and time. Mental status is at baseline.  Psychiatric:        Mood and Affect: Mood normal.        Behavior: Behavior normal.        Thought Content: Thought content normal.        Judgment: Judgment normal.    Lab Results  Component Value Date   TSH 1.720 03/11/2019   Lab Results  Component Value Date   WBC 8.2 04/23/2020   HGB 14.0 04/23/2020   HCT 41.6 04/23/2020   MCV 85 04/23/2020   PLT 258 04/23/2020   Lab Results  Component Value Date   NA 141 04/23/2020   K 3.8 04/23/2020   CO2 27 04/23/2020   GLUCOSE 115 (H) 04/23/2020   BUN 9 04/23/2020   CREATININE 1.41 (H) 04/23/2020   BILITOT 0.3 04/23/2020   ALKPHOS 138 (H) 04/23/2020   AST 14 04/23/2020   ALT 13 04/23/2020   PROT 7.1 04/23/2020   ALBUMIN 4.1 04/23/2020   CALCIUM 9.1 04/23/2020   ANIONGAP 10 11/04/2019   Lab Results  Component Value Date   CHOL 127 04/23/2020   Lab Results  Component Value Date   HDL 26 (L) 04/23/2020   Lab Results  Component Value Date   LDLCALC 73 04/23/2020   Lab Results  Component Value Date   TRIG 163 (H) 04/23/2020   Lab Results  Component Value Date   CHOLHDL 4.9 04/23/2020   Lab Results  Component Value Date   HGBA1C 5.7 04/23/2020

## 2020-10-07 LAB — CMP14+EGFR
ALT: 11 IU/L (ref 0–44)
AST: 12 IU/L (ref 0–40)
Albumin/Globulin Ratio: 1.5 (ref 1.2–2.2)
Albumin: 4 g/dL (ref 3.8–4.8)
Alkaline Phosphatase: 118 IU/L (ref 44–121)
BUN/Creatinine Ratio: 5 — ABNORMAL LOW (ref 10–24)
BUN: 6 mg/dL — ABNORMAL LOW (ref 8–27)
Bilirubin Total: 0.6 mg/dL (ref 0.0–1.2)
CO2: 27 mmol/L (ref 20–29)
Calcium: 9.1 mg/dL (ref 8.6–10.2)
Chloride: 105 mmol/L (ref 96–106)
Creatinine, Ser: 1.17 mg/dL (ref 0.76–1.27)
Globulin, Total: 2.6 g/dL (ref 1.5–4.5)
Glucose: 107 mg/dL — ABNORMAL HIGH (ref 65–99)
Potassium: 4 mmol/L (ref 3.5–5.2)
Sodium: 146 mmol/L — ABNORMAL HIGH (ref 134–144)
Total Protein: 6.6 g/dL (ref 6.0–8.5)
eGFR: 67 mL/min/{1.73_m2} (ref >59)

## 2020-10-07 LAB — CBC WITH DIFFERENTIAL/PLATELET
Basophils Absolute: 0.1 10*3/uL (ref 0.0–0.2)
Basos: 1 %
EOS (ABSOLUTE): 0.2 10*3/uL (ref 0.0–0.4)
Eos: 2 %
Hematocrit: 38.8 % (ref 37.5–51.0)
Hemoglobin: 12.3 g/dL — ABNORMAL LOW (ref 13.0–17.7)
Immature Grans (Abs): 0 10*3/uL (ref 0.0–0.1)
Immature Granulocytes: 0 %
Lymphocytes Absolute: 1.7 10*3/uL (ref 0.7–3.1)
Lymphs: 20 %
MCH: 27.8 pg (ref 26.6–33.0)
MCHC: 31.7 g/dL (ref 31.5–35.7)
MCV: 88 fL (ref 79–97)
Monocytes Absolute: 0.7 10*3/uL (ref 0.1–0.9)
Monocytes: 8 %
Neutrophils Absolute: 5.9 10*3/uL (ref 1.4–7.0)
Neutrophils: 69 %
Platelets: 240 10*3/uL (ref 150–450)
RBC: 4.43 x10E6/uL (ref 4.14–5.80)
RDW: 14.2 % (ref 11.6–15.4)
WBC: 8.6 10*3/uL (ref 3.4–10.8)

## 2020-10-07 LAB — LIPID PANEL
Chol/HDL Ratio: 6.7 ratio — ABNORMAL HIGH (ref 0.0–5.0)
Cholesterol, Total: 215 mg/dL — ABNORMAL HIGH (ref 100–199)
HDL: 32 mg/dL — ABNORMAL LOW (ref >39)
LDL Chol Calc (NIH): 163 mg/dL — ABNORMAL HIGH (ref 0–99)
Triglycerides: 109 mg/dL (ref 0–149)
VLDL Cholesterol Cal: 20 mg/dL (ref 5–40)

## 2020-10-07 MED ORDER — NEXLIZET 180-10 MG PO TABS: 1.0000 | ORAL_TABLET | Freq: Every day | ORAL | 2 refills | Status: DC

## 2020-10-07 NOTE — Addendum Note (Signed)
Addended by: Hendricks Limes F on: 10/07/2020 11:51 AM   Modules accepted: Orders

## 2020-10-08 LAB — URIC ACID: Uric Acid: 4.9 mg/dL (ref 3.8–8.4)

## 2020-10-08 LAB — SPECIMEN STATUS REPORT

## 2020-10-12 ENCOUNTER — Encounter: Payer: Self-pay | Admitting: Internal Medicine

## 2020-10-22 DIAGNOSIS — N183 Chronic kidney disease, stage 3 unspecified: Secondary | ICD-10-CM | POA: Diagnosis not present

## 2020-10-22 DIAGNOSIS — F17210 Nicotine dependence, cigarettes, uncomplicated: Secondary | ICD-10-CM | POA: Diagnosis not present

## 2020-10-22 DIAGNOSIS — Z79899 Other long term (current) drug therapy: Secondary | ICD-10-CM | POA: Diagnosis not present

## 2020-10-22 DIAGNOSIS — I10 Essential (primary) hypertension: Secondary | ICD-10-CM | POA: Diagnosis not present

## 2020-10-22 DIAGNOSIS — M5136 Other intervertebral disc degeneration, lumbar region: Secondary | ICD-10-CM | POA: Diagnosis not present

## 2020-11-09 ENCOUNTER — Telehealth: Payer: Self-pay | Admitting: Family Medicine

## 2020-11-09 NOTE — Telephone Encounter (Signed)
Pt says his insurance wont pay for his Nexlizet Rx.  Please advise and call patient

## 2020-11-09 NOTE — Telephone Encounter (Signed)
Can we do a PA?

## 2020-11-15 ENCOUNTER — Telehealth: Payer: Self-pay | Admitting: *Deleted

## 2020-11-15 DIAGNOSIS — E785 Hyperlipidemia, unspecified: Secondary | ICD-10-CM

## 2020-11-15 DIAGNOSIS — E1169 Type 2 diabetes mellitus with other specified complication: Secondary | ICD-10-CM

## 2020-11-15 NOTE — Telephone Encounter (Signed)
Approved today PA Case: [id number], Status: Approved, Coverage Starts on: 03/27/2020 12:00:00 AM, Coverage Ends on: 03/26/2021 12:00:00 AM. Questions? Contact [phone]  The drug store aware

## 2020-11-15 NOTE — Telephone Encounter (Signed)
Bempedoic Acid-Ezetimibe (NEXLIZET) 180-10 MG TABS PA started  Key: [id number]   Sent to plan

## 2020-11-19 DIAGNOSIS — Z79899 Other long term (current) drug therapy: Secondary | ICD-10-CM | POA: Diagnosis not present

## 2020-11-19 DIAGNOSIS — M1612 Unilateral primary osteoarthritis, left hip: Secondary | ICD-10-CM | POA: Diagnosis not present

## 2020-11-19 DIAGNOSIS — M5136 Other intervertebral disc degeneration, lumbar region: Secondary | ICD-10-CM | POA: Diagnosis not present

## 2020-11-19 DIAGNOSIS — I10 Essential (primary) hypertension: Secondary | ICD-10-CM | POA: Diagnosis not present

## 2020-11-19 DIAGNOSIS — F17210 Nicotine dependence, cigarettes, uncomplicated: Secondary | ICD-10-CM | POA: Diagnosis not present

## 2020-11-19 DIAGNOSIS — N183 Chronic kidney disease, stage 3 unspecified: Secondary | ICD-10-CM | POA: Diagnosis not present

## 2020-12-02 ENCOUNTER — Ambulatory Visit: Payer: Medicare PPO | Admitting: Family Medicine

## 2020-12-13 ENCOUNTER — Other Ambulatory Visit: Payer: Self-pay | Admitting: Family Medicine

## 2020-12-14 ENCOUNTER — Encounter: Payer: Self-pay | Admitting: Internal Medicine

## 2020-12-14 ENCOUNTER — Ambulatory Visit: Payer: Medicare PPO

## 2020-12-15 ENCOUNTER — Other Ambulatory Visit: Payer: Self-pay | Admitting: Family Medicine

## 2020-12-16 ENCOUNTER — Ambulatory Visit (INDEPENDENT_AMBULATORY_CARE_PROVIDER_SITE_OTHER): Payer: Self-pay | Admitting: *Deleted

## 2020-12-16 ENCOUNTER — Other Ambulatory Visit: Payer: Self-pay

## 2020-12-16 VITALS — Ht 66.0 in | Wt 162.0 lb

## 2020-12-16 DIAGNOSIS — Z1211 Encounter for screening for malignant neoplasm of colon: Secondary | ICD-10-CM

## 2020-12-16 DIAGNOSIS — Z79899 Other long term (current) drug therapy: Secondary | ICD-10-CM | POA: Diagnosis not present

## 2020-12-16 DIAGNOSIS — I10 Essential (primary) hypertension: Secondary | ICD-10-CM | POA: Diagnosis not present

## 2020-12-16 DIAGNOSIS — N183 Chronic kidney disease, stage 3 unspecified: Secondary | ICD-10-CM | POA: Diagnosis not present

## 2020-12-16 DIAGNOSIS — F17210 Nicotine dependence, cigarettes, uncomplicated: Secondary | ICD-10-CM | POA: Diagnosis not present

## 2020-12-16 DIAGNOSIS — M1612 Unilateral primary osteoarthritis, left hip: Secondary | ICD-10-CM | POA: Diagnosis not present

## 2020-12-16 DIAGNOSIS — Z0000 Encounter for general adult medical examination without abnormal findings: Secondary | ICD-10-CM | POA: Diagnosis not present

## 2020-12-16 DIAGNOSIS — M5136 Other intervertebral disc degeneration, lumbar region: Secondary | ICD-10-CM | POA: Diagnosis not present

## 2020-12-16 NOTE — Progress Notes (Signed)
Patient needs ov. He will need propofol, ASA III.

## 2020-12-16 NOTE — Progress Notes (Signed)
Gastroenterology Pre-Procedure Review  Request Date: 12/16/2020 Requesting Physician: Hendricks Limes, FNP @ [facility], Last TCS done 15 years ago in [place], pt could not remember doctor's name, normal rectum per pt  PATIENT REVIEW QUESTIONS: The patient responded to the following health history questions as indicated:    1. Diabetes Melitis: yes, type II 2. Joint replacements in the past 12 months: no 3. Major health problems in the past 3 months: no 4. Has an artificial valve or MVP: no 5. Has a defibrillator: no 6. Has been advised in past to take antibiotics in advance of a procedure like teeth cleaning: no 7. Family history of colon cancer: no  8. Alcohol Use: no 9. Illicit drug Use: no 10. History of sleep apnea: no  11. History of coronary artery or other vascular stents placed within the last 12 months: no 12. History of any prior anesthesia complications: yes, hard to wake 15 years ago-EGD at [facility] 13. Body mass index is 26.15 kg/m.    MEDICATIONS & ALLERGIES:    Patient reports the following regarding taking any blood thinners:   Plavix? yes Aspirin? Yes, 81 mg Coumadin? no Brilinta? no Xarelto? no Eliquis? no Pradaxa? no Savaysa? no Effient? no  Patient confirms/reports the following medications:  Current Outpatient Medications  Medication Sig Dispense Refill   albuterol (VENTOLIN HFA) 108 (90 Base) MCG/ACT inhaler Inhale 2 puffs into the lungs every 6 (six) hours as needed for wheezing or shortness of breath. (Patient taking differently: Inhale 2 puffs into the lungs as needed for wheezing or shortness of breath.) 18 g 5   allopurinol (ZYLOPRIM) 100 MG tablet Take 1 tablet (100 mg total) by mouth 3 (three) times daily. 270 tablet 1   aspirin EC 81 MG tablet Take 81 mg by mouth at bedtime.      carvedilol (COREG) 25 MG tablet Take 1 tablet (25 mg total) by mouth 2 (two) times daily. 180 tablet 1   cetirizine (ZYRTEC) 10 MG tablet  Take 1 tablet (10 mg total) by mouth daily as needed (for allergies.). 90 tablet 3   clopidogrel (PLAVIX) 75 MG tablet Take 1 tablet (75 mg total) by mouth at bedtime. 90 tablet 1   cyclobenzaprine (FLEXERIL) 10 MG tablet TAKE 1 TABLET AT BEDTIME AS NEEDED FOR MUSCLE SPASMS 90 tablet 1   HYDROcodone-acetaminophen (NORCO) 10-325 MG tablet Take 1 tablet by mouth 4 (four) times daily.     isosorbide mononitrate (IMDUR) 60 MG 24 hr tablet Take 1 tablet (60 mg total) by mouth daily. 90 tablet 1   liraglutide (VICTOZA) 18 MG/3ML SOPN INJECT 0.3ML (1.'8MG'$ ) SQ DAILY 27 mL 2   lisinopril (ZESTRIL) 10 MG tablet Take 1 tablet (10 mg total) by mouth at bedtime. 90 tablet 1   Multiple Vitamin (MULTIVITAMIN WITH MINERALS) TABS tablet Take 1 tablet by mouth at bedtime. Centrum Silver     NEXLIZET 180-10 MG TABS TAKE ONE (1) TABLET BY MOUTH EVERY DAY 30 tablet 0   nitroGLYCERIN (NITROSTAT) 0.4 MG SL tablet Place 1 tablet (0.4 mg total) under the tongue every 5 (five) minutes as needed for chest pain. (Patient taking differently: Place 0.4 mg under the tongue as directed.) 25 tablet 3   pravastatin (PRAVACHOL) 80 MG tablet Take 1 tablet (80 mg total) by mouth every evening. 90 tablet 1   pregabalin (LYRICA) 75 MG capsule Take 1 capsule (75 mg total) by mouth 3 (three) times daily. New lower RENAL DOSING 90 capsule 5  torsemide (DEMADEX) 20 MG tablet Take 1 tablet (20 mg total) by mouth 2 (two) times daily. 180 tablet 1   No current facility-administered medications for this visit.    Patient confirms/reports the following allergies:  Allergies  Allergen Reactions   Atorvastatin     All statins make him cough   Statins Cough    Pt is currently taking pravastatin    Sulfa Antibiotics    Lexapro [Escitalopram] Palpitations    No orders of the defined types were placed in this encounter.   [place] INFORMATION Primary Insurance: [facility] [id number],  Group #: [id number] Pre-Cert / Josem Kaufmann  required:  Pre-Cert / Auth #:   SCHEDULE INFORMATION: Procedure has been scheduled as follows:  Date: , Time:   Location:   This Gastroenterology Pre-Precedure Review Form is being routed to the following provider(s): Neil Crouch, PA-C

## 2020-12-17 NOTE — Progress Notes (Signed)
Called pt and made him aware that he needed ov to arrange colonoscopy.  Scheduled ov for 02/16/2021 at 3:00 with Roseanne Kaufman, NP.

## 2020-12-20 ENCOUNTER — Other Ambulatory Visit: Payer: Self-pay | Admitting: Family Medicine

## 2020-12-20 DIAGNOSIS — N182 Chronic kidney disease, stage 2 (mild): Secondary | ICD-10-CM

## 2020-12-20 DIAGNOSIS — E1122 Type 2 diabetes mellitus with diabetic chronic kidney disease: Secondary | ICD-10-CM

## 2020-12-23 ENCOUNTER — Ambulatory Visit: Payer: Medicare PPO | Admitting: Cardiology

## 2020-12-23 ENCOUNTER — Encounter: Payer: Self-pay | Admitting: Cardiology

## 2020-12-23 VITALS — BP 154/90 | HR 95 | Ht 66.0 in | Wt 160.8 lb

## 2020-12-23 DIAGNOSIS — I10 Essential (primary) hypertension: Secondary | ICD-10-CM | POA: Diagnosis not present

## 2020-12-23 DIAGNOSIS — I2510 Atherosclerotic heart disease of native coronary artery without angina pectoris: Secondary | ICD-10-CM

## 2020-12-23 DIAGNOSIS — E782 Mixed hyperlipidemia: Secondary | ICD-10-CM

## 2020-12-23 DIAGNOSIS — I739 Peripheral vascular disease, unspecified: Secondary | ICD-10-CM | POA: Diagnosis not present

## 2020-12-23 MED ORDER — LISINOPRIL 20 MG PO TABS: 20.0000 mg | ORAL_TABLET | Freq: Every day | ORAL | 6 refills | Status: DC

## 2020-12-23 NOTE — Progress Notes (Signed)
Clinical Summary Tommy Douglas is a 70 y.o.male seen today for follow up of the following medical problems.    1. CAD - history of prior LAD and RCA stenting.  - last intervention 11/2016 with DES to mid RCA, small vessel residual disease PDA and PL branches too small for intervention. Subtotal LCX with collaterals   - no chest pains. No SOB/DOE - compliant with meds   2. PAD - followed by Dr Gwenlyn Found - prior left exertnal iliac stent.    12/2019 LE arterial US: 30-49% mid and distal SFA. Patent left prox mid exteranl iliac stent.  12/2019 ABI: 0.9 Left 1.03 though noncompressible - some recent ongoing left groin pain     3. Hyperlipidemia - 08/2019 TC 120 TG 218 HDL 21 LDL 63 - side effects on more potent statins.   -recent significant elevation in LDL - he is on pravastatin '80mg'$  daily, bemepdoic acid/zetia    4. Chronic diastolic HF - some recent edema - taking torsemide   5. CKD 3 - Cr has been stable     6. HTN     - home bp's 160s/80s   Past Medical History:  Diagnosis Date   Anxiety    Arthritis    "qwhere" (12/20/2016)   Chronic lower back pain    CKD (chronic kidney disease), stage IV (HCC)    Complication of anesthesia    "got the wrong kind of anesthesia ~ 2000 when they were looking down into my stomach"   Coronary artery disease    Diabetic nephropathy ([place])    Gout    Heart disease    Hyperlipidemia    Hypertension    Pneumonia ~ 2015   Seasonal allergies    Type II diabetes mellitus (HCC)      Allergies  Allergen Reactions   Atorvastatin     All statins make him cough   Statins Cough    Pt is currently taking pravastatin    Sulfa Antibiotics    Lexapro [Escitalopram] Palpitations     Current Outpatient Medications  Medication Sig Dispense Refill   albuterol (VENTOLIN HFA) 108 (90 Base) MCG/ACT inhaler Inhale 2 puffs into the lungs every 6 (six) hours as needed for wheezing or shortness of breath. (Patient taking differently:  Inhale 2 puffs into the lungs as needed for wheezing or shortness of breath.) 18 g 5   allopurinol (ZYLOPRIM) 100 MG tablet Take 1 tablet (100 mg total) by mouth 3 (three) times daily. 270 tablet 1   aspirin EC 81 MG tablet Take 81 mg by mouth at bedtime.      carvedilol (COREG) 25 MG tablet Take 1 tablet (25 mg total) by mouth 2 (two) times daily. 180 tablet 1   cetirizine (ZYRTEC) 10 MG tablet Take 1 tablet (10 mg total) by mouth daily as needed (for allergies.). 90 tablet 3   clopidogrel (PLAVIX) 75 MG tablet Take 1 tablet (75 mg total) by mouth at bedtime. 90 tablet 1   cyclobenzaprine (FLEXERIL) 10 MG tablet TAKE 1 TABLET AT BEDTIME AS NEEDED FOR MUSCLE SPASMS 90 tablet 1   HYDROcodone-acetaminophen (NORCO) 10-325 MG tablet Take 1 tablet by mouth 4 (four) times daily.     isosorbide mononitrate (IMDUR) 60 MG 24 hr tablet Take 1 tablet (60 mg total) by mouth daily. 90 tablet 1   lisinopril (ZESTRIL) 10 MG tablet Take 1 tablet (10 mg total) by mouth at bedtime. 90 tablet 1   Multiple Vitamin (MULTIVITAMIN WITH  MINERALS) TABS tablet Take 1 tablet by mouth at bedtime. Centrum Silver     NEXLIZET 180-10 MG TABS TAKE ONE (1) TABLET BY MOUTH EVERY DAY 30 tablet 0   nitroGLYCERIN (NITROSTAT) 0.4 MG SL tablet Place 1 tablet (0.4 mg total) under the tongue every 5 (five) minutes as needed for chest pain. (Patient taking differently: Place 0.4 mg under the tongue as directed.) 25 tablet 3   pravastatin (PRAVACHOL) 80 MG tablet Take 1 tablet (80 mg total) by mouth every evening. 90 tablet 1   pregabalin (LYRICA) 75 MG capsule Take 1 capsule (75 mg total) by mouth 3 (three) times daily. New lower RENAL DOSING 90 capsule 5   torsemide (DEMADEX) 20 MG tablet Take 1 tablet (20 mg total) by mouth 2 (two) times daily. 180 tablet 1   VICTOZA 18 MG/3ML SOPN INJECT 0.3ML (1.'8MG'$ ) SQ DAILY 27 mL 0   No current facility-administered medications for this visit.     Past Surgical History:  Procedure Laterality  Date   ABDOMINAL AORTOGRAM W/LOWER EXTREMITY N/A 06/02/2019   Procedure: ABDOMINAL AORTOGRAM W/LOWER EXTREMITY;  Surgeon: Lorretta Harp, MD;  Location: [facility];  Service: Cardiovascular;  Laterality: N/A;   CARDIAC CATHETERIZATION  12/20/2016   CORONARY ANGIOPLASTY WITH STENT PLACEMENT  2004 X 2   "1st stent moved"   CORONARY STENT INTERVENTION N/A 12/21/2016   Procedure: CORONARY STENT INTERVENTION;  Surgeon: Burnell Blanks, MD;  Location: [facility];  Service: Cardiovascular;  Laterality: N/A;   ESOPHAGOGASTRODUODENOSCOPY  ~ 2000   LAPAROSCOPIC CHOLECYSTECTOMY     LEFT HEART CATH AND CORONARY ANGIOGRAPHY N/A 12/20/2016   Procedure: LEFT HEART CATH AND CORONARY ANGIOGRAPHY;  Surgeon: Burnell Blanks, MD;  Location: [facility];  Service: Cardiovascular;  Laterality: N/A;     Allergies  Allergen Reactions   Atorvastatin     All statins make him cough   Statins Cough    Pt is currently taking pravastatin    Sulfa Antibiotics    Lexapro [Escitalopram] Palpitations      Family History  Problem Relation Age of Onset   Cancer Mother        male   Diabetes Mother    Stroke Father        x3   Stroke Brother        x2    Other Sister        bowel necrosis?    Other Daughter        gout   Other Son        gout   Alcohol abuse Brother    Other Sister        pneumonia   Other Sister        pneumonia   Cancer Daughter        male   Other Daughter        fluid on brain - disabled      Social History Tommy Douglas reports that he has been smoking cigarettes. He has a 22.00 pack-year smoking history. He has never used smokeless tobacco. Tommy Douglas reports no history of alcohol use.   Review of Systems CONSTITUTIONAL: No weight loss, fever, chills, weakness or fatigue.  HEENT: Eyes: No visual loss, blurred vision, double vision or yellow sclerae.No hearing loss, sneezing, congestion, runny nose or sore throat.  SKIN: No  rash or itching.  CARDIOVASCULAR: per hpi RESPIRATORY: No shortness of breath, cough or sputum.  GASTROINTESTINAL: No anorexia, nausea,  vomiting or diarrhea. No abdominal pain or blood.  GENITOURINARY: No burning on urination, no polyuria NEUROLOGICAL: No headache, dizziness, syncope, paralysis, ataxia, numbness or tingling in the extremities. No change in bowel or bladder control.  MUSCULOSKELETAL: No muscle, back pain, joint pain or stiffness.  LYMPHATICS: No enlarged nodes. No history of splenectomy.  PSYCHIATRIC: No history of depression or anxiety.  ENDOCRINOLOGIC: No reports of sweating, cold or heat intolerance. No polyuria or polydipsia.  [name]   Physical Examination Today's Vitals   12/23/20 1344 12/23/20 1352  BP: (!) 160/80 (!) 154/90  Pulse: 95   SpO2: 98%   Weight: 160 lb 12.8 oz (72.9 kg)   Height: '5\' 6"'$  (1.676 m)    Body mass index is 25.95 kg/m.  Gen: resting comfortably, no acute distress HEENT: no scleral icterus, pupils equal round and reactive, no palptable cervical adenopathy,  CV: RRR, no m/r/g no jvd Resp: Clear to auscultation bilaterally GI: abdomen is soft, non-tender, non-distended, normal bowel sounds, no hepatosplenomegaly MSK: extremities are warm, no edema.  Skin: warm, no rash Neuro:  no focal deficits Psych: appropriate affect   Diagnostic Studies 12/2019 LE [place] Summary:  Left: No significant change as compared to previous study. Heterogenous  plaque throughout.  30-49% stenosis in the mid and distal SFA.   No significant findings noted in the left groin during supine and standing  positions. There is a lymph node at area of pain, measuring 1.5 x .90 x  .98 cm. This lymph node appears to be normal. Inguinal hernia not evident,  but can not be excluded.    09/2017 echo Study Conclusions   - Procedure narrative: Transthoracic echocardiography. Image    quality was adequate. The study was technically difficult, as a    result of poor  patient compliance.  - Left ventricle: The cavity size was normal. There was mild    concentric and moderate basal septal hypertrophy. Systolic    function was normal. The estimated ejection fraction was in the    range of 60% to 65%. Wall motion was normal; there were no    regional wall motion abnormalities. Doppler parameters are    consistent with abnormal left ventricular relaxation (grade 1    diastolic dysfunction). Doppler parameters are consistent with    indeterminate ventricular filling pressure.  - Aortic valve: Mildly calcified annulus. Trileaflet; normal    thickness leaflets.  - Atrial septum: No defect or patent foramen ovale was identified.  - Tricuspid valve: There was mild regurgitation.  - Pericardium, extracardiac: A prominent pericardial fat pad was    present.    11/2016 cath   Ost RCA to Mid RCA lesion, 0 %stenosed. Prox RCA lesion, 40 %stenosed. Mid RCA to Dist RCA lesion, 90 %stenosed. Dist RCA lesion, 99 %stenosed. Ost RPDA lesion, 99 %stenosed. Prox Cx to Mid Cx lesion, 99 %stenosed. Ost 3rd Mrg to 3rd Mrg lesion, 80 %stenosed. Ost LAD to Prox LAD lesion, 10 %stenosed. Prox LAD to Mid LAD lesion, 40 %stenosed. Dist LAD lesion, 50 %stenosed.   1. Triple vessel CAD 2. The LAD is a large caliber vessel that courses to the apex. The proximal vessel appears to have a stent but this could be heavy calcification. (No previous cath records available). The mid and distal vessel has mild to moderate non-obstructive disease.  3. The Circumflex has a sub-total occlusion in the proximal segment and fills distally from right to left and left to left collaterals.  4. The RCA is a  large dominant vessel with a patent proximal stent. The stented segment has focal moderate restenosis. The mid vessel has severe stenosis. The small to moderate caliber PDA and posterolateral artery have severe stenosis.  5. Normal filling pressures.    Recommendations: He has stage 3 CKD. He  has severe CAD. I think we should consider PCI of the RCA and possible the Circumflex artery. Given his renal dysfunction, will admit and hydrate today and plan PCI later this week. Continue ASA/Plavix.    11/2016 cath intervention 1. Unstable angina with inferior wall ischemia on nuclear stress test and severe mid RCA stenosis on diagnostic cath yesterday. Successful PTCA/DES x 1 mid RCA. There is residual disease in the small caliber PDA and posterolateral branches. These vessels are too small for PCI.  2. The IC team has reviewed the Circumflex artery disease. This is a small to moderate caliber artery with sub-total proximal to mid occlusion and diffuse disease throughout with collateral filling. Will not attempt PCI of this vessel.    Continue DAPT for one year with ASA and Plavix.     Assessment and Plan   1. CAD - no recent symptoms, continue current meds   2. PAD - prior stent, recent imaging was normal - contineu to follow with Dr Gwenlyn Found   3. Hyperlipidemia - significant increase in LDL on last check, pcp had added bempedoic acid to his pravastatin and zetia. Difficulty tolerating more potent statins - repeat lipid panel, if LDL not at goal would consider changing nexlizet for repatha.    4. HTN - above goal, increase lisinopril to '20mg'$  daily, bmet in 2 weeks.     Arnoldo Lenis, M.D.

## 2020-12-23 NOTE — Patient Instructions (Addendum)
Medication Instructions:  Increase Lisinopril to '20mg'$  daily.  Continue all other medications.     Labwork: FLP, BMET - orders given today. Please do labs in 2 weeks (around 01/06/2021).   Reminder:  Nothing to eat or drink after 12 midnight prior to labs. Office will contact with results via phone or letter.     Testing/Procedures: none  Follow-Up: 4 months   Any Other Special Instructions Will Be Listed Below (If Applicable).   If you need a refill on your cardiac medications before your next appointment, please call your pharmacy.

## 2020-12-27 ENCOUNTER — Other Ambulatory Visit: Payer: Self-pay | Admitting: Family Medicine

## 2020-12-29 ENCOUNTER — Ambulatory Visit: Payer: Medicare PPO | Admitting: Orthopaedic Surgery

## 2020-12-29 ENCOUNTER — Other Ambulatory Visit: Payer: Medicare PPO

## 2020-12-29 ENCOUNTER — Other Ambulatory Visit: Payer: Self-pay

## 2020-12-29 DIAGNOSIS — I10 Essential (primary) hypertension: Secondary | ICD-10-CM | POA: Diagnosis not present

## 2020-12-29 DIAGNOSIS — E782 Mixed hyperlipidemia: Secondary | ICD-10-CM | POA: Diagnosis not present

## 2020-12-30 LAB — BASIC METABOLIC PANEL
BUN/Creatinine Ratio: 8 — ABNORMAL LOW (ref 10–24)
BUN: 13 mg/dL (ref 8–27)
CO2: 26 mmol/L (ref 20–29)
Calcium: 9.5 mg/dL (ref 8.6–10.2)
Chloride: 105 mmol/L (ref 96–106)
Creatinine, Ser: 1.61 mg/dL — ABNORMAL HIGH (ref 0.76–1.27)
Glucose: 98 mg/dL (ref 70–99)
Potassium: 4.2 mmol/L (ref 3.5–5.2)
Sodium: 143 mmol/L (ref 134–144)
eGFR: 46 mL/min/{1.73_m2} — ABNORMAL LOW (ref >59)

## 2020-12-30 LAB — LIPID PANEL
Chol/HDL Ratio: 4.8 ratio (ref 0.0–5.0)
Cholesterol, Total: 139 mg/dL (ref 100–199)
HDL: 29 mg/dL — ABNORMAL LOW (ref >39)
LDL Chol Calc (NIH): 86 mg/dL (ref 0–99)
Triglycerides: 136 mg/dL (ref 0–149)
VLDL Cholesterol Cal: 24 mg/dL (ref 5–40)

## 2020-12-31 ENCOUNTER — Encounter: Payer: Self-pay | Admitting: Internal Medicine

## 2021-01-04 ENCOUNTER — Telehealth: Payer: Self-pay | Admitting: Family Medicine

## 2021-01-04 DIAGNOSIS — E785 Hyperlipidemia, unspecified: Secondary | ICD-10-CM

## 2021-01-04 DIAGNOSIS — E1169 Type 2 diabetes mellitus with other specified complication: Secondary | ICD-10-CM

## 2021-01-04 NOTE — Telephone Encounter (Signed)
Additional Information Required A Communication error has occurred, please retry later. If this issue re-occurs, please contact your administrator's support team or contact [employer] at [phone] for further assistance.  We will try and complete again tomorrow.

## 2021-01-05 ENCOUNTER — Telehealth: Payer: Self-pay | Admitting: *Deleted

## 2021-01-05 NOTE — Telephone Encounter (Signed)
Tommy Blazer, LPN  [id number]  [id number] PM EDT Back to Top    Notified, copy to pcp.  States he will try to work on diet & exercise for now.  Will hold off on going to [facility] for now and discuss further in January when he sees Dr. Harl Bowie

## 2021-01-05 NOTE — Telephone Encounter (Signed)
Renewed PA   Response: Additional Information Required A Communication error has occurred, please retry later. If this issue re-occurs, please contact your administrator's support team or contact [employer] at [phone] for further assistance

## 2021-01-05 NOTE — Telephone Encounter (Signed)
-----   Message from Arnoldo Lenis, MD sent at 01/04/2021  7:26 AM EDT ----- Choletserol improved but remains above goal, can we refer him to lipid clinic to get settle on the best cholesterol regimen for him   Zandra Abts MD

## 2021-01-10 NOTE — Telephone Encounter (Signed)
Sent to Plan today- 10/17-jhb

## 2021-01-11 NOTE — Telephone Encounter (Signed)
Your request has been approved PA Case: [id number], Status: Approved, Coverage Starts on: 03/27/2020 12:00:00 AM, Coverage Ends on: 03/26/2022 12:00:00 AM. Questions? Contact [phone]  Pharmacy aware.

## 2021-01-13 ENCOUNTER — Telehealth: Payer: Self-pay | Admitting: Family Medicine

## 2021-01-13 DIAGNOSIS — I2510 Atherosclerotic heart disease of native coronary artery without angina pectoris: Secondary | ICD-10-CM

## 2021-01-13 DIAGNOSIS — Z9861 Coronary angioplasty status: Secondary | ICD-10-CM

## 2021-01-13 DIAGNOSIS — E1169 Type 2 diabetes mellitus with other specified complication: Secondary | ICD-10-CM

## 2021-01-13 NOTE — Telephone Encounter (Signed)
Dr. Harl Bowie, cardiologist, had recommended patient be seen at the lipid clinic in [place] due to cholesterol levels being above his goal. He is maxed out on pravastatin and nexlizet. Patient did not want to go all the way to [facility]. I offered to get patient set up with our clinical pharmacist for [place], which patient is agreeable to. Referral placed for this.

## 2021-01-17 ENCOUNTER — Other Ambulatory Visit: Payer: Self-pay | Admitting: Family Medicine

## 2021-01-18 ENCOUNTER — Telehealth: Payer: Self-pay

## 2021-01-18 NOTE — Chronic Care Management (AMB) (Signed)
  Chronic Care Management   Note  01/18/2021 Name: Tommy Douglas MRN: [id number] DOB: [birth year]  Tommy Douglas is a 70 y.o. year old male who is a primary care patient of Loman Brooklyn, FNP. I reached out to Richardo Hanks by phone today in response to a referral sent by Tommy Douglas.  Tommy Douglas was given information about Chronic Care Management services today including:  CCM service includes personalized support from designated clinical staff supervised by his physician, including individualized plan of care and coordination with other care providers 24/7 contact phone numbers for assistance for urgent and routine care needs. Service will only be billed when office clinical staff spend 20 minutes or more in a month to coordinate care. Only one practitioner may furnish and bill the service in a calendar month. The patient may stop CCM services at any time (effective at the end of the month) by phone call to the office staff. The patient is responsible for co-pay (up to 20% after annual deductible is met) if co-pay is required by the individual health plan.   Patient agreed to services and verbal consent obtained.   Follow up plan: Telephone appointment with care management team member scheduled for:02/11/2021  Noreene Larsson, [place], [place], [place] [address] Direct Dial: [phone] [email or web address] Website: [email or web address]

## 2021-01-18 NOTE — Telephone Encounter (Signed)
Pt has been scheduled.  °

## 2021-01-20 DIAGNOSIS — Z79899 Other long term (current) drug therapy: Secondary | ICD-10-CM | POA: Diagnosis not present

## 2021-01-20 DIAGNOSIS — M1612 Unilateral primary osteoarthritis, left hip: Secondary | ICD-10-CM | POA: Diagnosis not present

## 2021-01-20 DIAGNOSIS — M5136 Other intervertebral disc degeneration, lumbar region: Secondary | ICD-10-CM | POA: Diagnosis not present

## 2021-01-20 DIAGNOSIS — F17210 Nicotine dependence, cigarettes, uncomplicated: Secondary | ICD-10-CM | POA: Diagnosis not present

## 2021-01-20 DIAGNOSIS — I10 Essential (primary) hypertension: Secondary | ICD-10-CM | POA: Diagnosis not present

## 2021-01-20 DIAGNOSIS — N183 Chronic kidney disease, stage 3 unspecified: Secondary | ICD-10-CM | POA: Diagnosis not present

## 2021-01-24 ENCOUNTER — Ambulatory Visit: Payer: Medicare Other | Admitting: Cardiology

## 2021-01-28 LAB — HM DIABETES EYE EXAM

## 2021-02-03 ENCOUNTER — Other Ambulatory Visit: Payer: Self-pay | Admitting: Family Medicine

## 2021-02-03 DIAGNOSIS — I10 Essential (primary) hypertension: Secondary | ICD-10-CM

## 2021-02-03 DIAGNOSIS — I2510 Atherosclerotic heart disease of native coronary artery without angina pectoris: Secondary | ICD-10-CM

## 2021-02-03 DIAGNOSIS — K219 Gastro-esophageal reflux disease without esophagitis: Secondary | ICD-10-CM

## 2021-02-08 ENCOUNTER — Other Ambulatory Visit: Payer: Self-pay | Admitting: Family Medicine

## 2021-02-08 DIAGNOSIS — I10 Essential (primary) hypertension: Secondary | ICD-10-CM

## 2021-02-11 ENCOUNTER — Telehealth: Payer: Self-pay | Admitting: Pharmacist

## 2021-02-11 ENCOUNTER — Other Ambulatory Visit: Payer: Self-pay

## 2021-02-11 ENCOUNTER — Ambulatory Visit (INDEPENDENT_AMBULATORY_CARE_PROVIDER_SITE_OTHER): Payer: Medicare PPO | Admitting: Pharmacist

## 2021-02-11 DIAGNOSIS — E1169 Type 2 diabetes mellitus with other specified complication: Secondary | ICD-10-CM

## 2021-02-11 DIAGNOSIS — I10 Essential (primary) hypertension: Secondary | ICD-10-CM

## 2021-02-11 MED ORDER — REPATHA SURECLICK 140 MG/ML ~~LOC~~ SOAJ: 140.0000 mg | SUBCUTANEOUS | 6 refills | Status: AC

## 2021-02-11 NOTE — Telephone Encounter (Signed)
PLEASE FOLLOW UP FOR APPROVAL  SUBMITTED PA FOR REPATHA

## 2021-02-11 NOTE — Progress Notes (Signed)
Chronic Care Management Pharmacy Note  02/11/2021 Name:  Tommy Douglas MRN:  [id number] DOB:  08-03-1950  Summary: HLD, HTN  Recommendations/Changes made from today's visit: Hypertension:  New goal. Uncontrolled; current treatment:LISINOPRIL, COREG;  Current home readings: 150/90s Denies hypotensive/hypertensive symptoms Educated on ENCOURAGED PATIENT TO QUIT SMOKING, MAY HAVE TO INCREASE BP MEDS; GOAL <130/80; RECHECK AT PCP F/U  Hyperlipidemia:  New goal. Uncontrolled; current treatment:PRAVASTATIN, NEXLIZET;  Lipid Panel     Component Value Date/Time   CHOL 139 12/29/2020 1412   TRIG 136 12/29/2020 1412   HDL 29 (L) 12/29/2020 1412   CHOLHDL 4.8 12/29/2020 1412   LDLCALC 86 12/29/2020 1412   LABVLDL 24 12/29/2020 1412  WILL TRANSITION FROM NEXLIZET TO REPATHA APPLICATION FOR [facility] SUBMITTED Current dietary patterns: ENCOURAGED HEART HEALTHY DIET Recommended REPATHA Assessed patient finances. [facility]    Patient Goals/Self-Care Activities patient will:  - take medications as prescribed as evidenced by patient report and record review check blood pressure DAILY, document, and provide at future appointments engage in dietary modifications by HEART HEALTHY DIET   Plan:  Subjective: Tommy Douglas is an 70 y.o. year old male who is a primary patient of Loman Brooklyn, FNP.  The CCM team was consulted for assistance with disease management and care coordination needs.    Engaged with patient face to face for initial visit in response to provider referral for pharmacy case management and/or care coordination services.   Consent to Services:  The patient was given information about Chronic Care Management services, agreed to services, and gave verbal consent prior to initiation of services.  Please see initial visit note for detailed documentation.   Patient Care Team: Loman Brooklyn, FNP as PCP - General (Family Medicine) Harl Bowie  Alphonse Guild, MD as PCP - Cardiology (Cardiology) Lorretta Harp, MD as Consulting Physician (Cardiology) Gala Romney Cristopher Estimable, MD as Consulting Physician (Gastroenterology) Lavera Guise, [facility] (Pharmacist) Jasper Loser, MD as Referring Physician (Orthopedic Surgery) [employer]  Objective:  Lab Results  Component Value Date   CREATININE 1.61 (H) 12/29/2020   CREATININE 1.17 10/06/2020   CREATININE 1.41 (H) 04/23/2020    Lab Results  Component Value Date   HGBA1C 5.3 10/06/2020   Last diabetic Eye exam:  Lab Results  Component Value Date/Time   HMDIABEYEEXA No Retinopathy 08/27/2019 12:00 AM    Last diabetic Foot exam: No results found for: HMDIABFOOTEX      Component Value Date/Time   CHOL 139 12/29/2020 1412   TRIG 136 12/29/2020 1412   HDL 29 (L) 12/29/2020 1412   CHOLHDL 4.8 12/29/2020 1412   [place] 86 12/29/2020 1412    Hepatic Function Latest Ref Rng & Units 10/06/2020 04/23/2020 09/11/2019  Total Protein 6.0 - 8.5 g/dL 6.6 7.1 7.1  Albumin 3.8 - 4.8 g/dL 4.0 4.1 4.0  AST 0 - 40 IU/L 12 14 29   ALT 0 - 44 IU/L 11 13 16   Alk Phosphatase 44 - 121 IU/L 118 138(H) 109  Total Bilirubin 0.0 - 1.2 mg/dL 0.6 0.3 0.3    Lab Results  Component Value Date/Time   TSH 1.720 03/11/2019 01:56 PM   TSH 1.730 07/27/2017 12:32 PM    CBC Latest Ref Rng & Units 10/06/2020 04/23/2020 09/11/2019  WBC 3.4 - 10.8 x10E3/uL 8.6 8.2 7.5  Hemoglobin 13.0 - 17.7 g/dL 12.3(L) 14.0 13.1  Hematocrit 37.5 - 51.0 % 38.8 41.6 40.9  Platelets 150 - 450 x10E3/uL 240 258 230  No results found for: VD25OH  Clinical ASCVD: Yes  The ASCVD Risk score (Arnett DK, et al., 2019) failed to calculate for the following reasons:   The patient has a prior MI or stroke diagnosis    Other: (CHADS2VASc if Afib, PHQ9 if depression, MMRC or CAT for COPD, ACT, DEXA)  Social History   Tobacco Use  Smoking Status Every Day   Packs/day: 0.50   Years: 44.00   Pack years: 22.00   Types: Cigarettes   Smokeless Tobacco Never   BP Readings from Last 3 Encounters:  12/23/20 (!) 154/90  10/06/20 (!) 170/87  08/06/20 118/69   Pulse Readings from Last 3 Encounters:  12/23/20 95  10/06/20 96  08/06/20 97   Wt Readings from Last 3 Encounters:  12/23/20 160 lb 12.8 oz (72.9 kg)  12/16/20 162 lb (73.5 kg)  10/06/20 167 lb (75.8 kg)    Assessment: Review of patient past medical history, allergies, medications, health status, including review of consultants reports, laboratory and other test data, was performed as part of comprehensive evaluation and provision of chronic care management services.   SDOH:  (Social Determinants of Health) assessments and interventions performed:    CCM Care Plan  Allergies  Allergen Reactions   Atorvastatin     All statins make him cough   Statins Cough    Pt is currently taking pravastatin    Sulfa Antibiotics    Lexapro [Escitalopram] Palpitations    Medications Reviewed Today     Reviewed by Lavera Guise, [facility] (Pharmacist) on 02/11/21 at 1407  Med List Status: <None>   Medication Order Taking? Sig Documenting Provider Last Dose Status Informant  albuterol (VENTOLIN HFA) 108 (90 Base) MCG/ACT inhaler [id number] No Inhale 2 puffs into the lungs every 6 (six) hours as needed for wheezing or shortness of breath.  Patient taking differently: Inhale 2 puffs into the lungs as needed for wheezing or shortness of breath.   Loman Brooklyn, FNP Taking Active   allopurinol (ZYLOPRIM) 100 MG tablet [id number] No Take 1 tablet (100 mg total) by mouth 3 (three) times daily. Loman Brooklyn, FNP Taking Active   aspirin EC 81 MG tablet [id number] No Take 81 mg by mouth at bedtime.  [provider] Taking Active Self  carvedilol (COREG) 25 MG tablet [id number]  Take 1 tablet (25 mg total) by mouth 2 (two) times daily. (NEEDS TO BE SEEN BEFORE NEXT REFILL) Loman Brooklyn, FNP  Active   cetirizine (ZYRTEC) 10 MG tablet [id number] No Take 1 tablet (10  mg total) by mouth daily as needed (for allergies.). Gwenlyn Perking, FNP Taking Active   clopidogrel (PLAVIX) 75 MG tablet [id number] No Take 1 tablet (75 mg total) by mouth at bedtime. Loman Brooklyn, FNP Taking Active   cyclobenzaprine (FLEXERIL) 10 MG tablet [id number] No TAKE 1 TABLET AT BEDTIME AS NEEDED FOR MUSCLE SPASMS Loman Brooklyn, FNP Taking Active   HYDROcodone-acetaminophen [facility]) 10-325 MG tablet [id number] No Take 1 tablet by mouth 4 (four) times daily. [provider] Taking Active Self  Insulin Pen Needle (GNP ULTICARE PEN NEEDLES) 32G X 6 MM MISC [id number]  Use to give insulin daily Dx E11.22 Loman Brooklyn, FNP  Active   isosorbide mononitrate (IMDUR) 60 MG 24 hr tablet [id number]  Take 1 tablet (60 mg total) by mouth daily. (NEEDS TO BE SEEN BEFORE NEXT REFILL) Loman Brooklyn, FNP  Active   lisinopril (ZESTRIL) 20 MG tablet [id number]  Take 1  tablet (20 mg total) by mouth at bedtime. Arnoldo Lenis, MD  Active   Multiple Vitamin (MULTIVITAMIN WITH MINERALS) TABS tablet [id number] No Take 1 tablet by mouth at bedtime. Centrum Silver [provider] Taking Active Self  NEXLIZET 180-10 MG TABS [id number]  TAKE ONE (1) TABLET BY MOUTH EVERY DAY Loman Brooklyn, FNP  Active   nitroGLYCERIN (NITROSTAT) 0.4 MG SL tablet [id number] No Place 1 tablet (0.4 mg total) under the tongue every 5 (five) minutes as needed for chest pain.  Patient taking differently: Place 0.4 mg under the tongue as directed.   Herminio Commons, MD Taking Active            Med Note Dorthey Sawyer   Mon Jun 02, 2019  7:03 AM) Never taken   pravastatin (PRAVACHOL) 80 MG tablet [id number] No Take 1 tablet (80 mg total) by mouth every evening. Loman Brooklyn, FNP Taking Active   pregabalin (LYRICA) 75 MG capsule [id number] No Take 1 capsule (75 mg total) by mouth 3 (three) times daily. New lower RENAL DOSING Loman Brooklyn, FNP Taking Active   torsemide (DEMADEX) 20 MG tablet  [id number] No Take 1 tablet (20 mg total) by mouth 2 (two) times daily. Loman Brooklyn, FNP Taking Active   VICTOZA 18 MG/3ML SOPN [id number] No INJECT 0.3ML (1.8MG) SQ DAILY Loman Brooklyn, FNP Taking Active   Med List Note Loman Brooklyn, [place] 04/25/20 1828): Pain clinic prescribes [place] 04/23/20.            Patient Active Problem List   Diagnosis Date Noted   Gastroesophageal reflux disease without esophagitis 08/06/2020   Diabetic nephropathy (HCC)    Chronic heart failure with preserved ejection fraction ([place]) 09/21/2019   PAD (peripheral artery disease) ([place]) 06/02/2019   Pain in joint involving pelvic region and thigh 12/26/2017   Stage 3 chronic kidney disease ([place]) 08/26/2017   Cough with hemoptysis 03/29/2017   PVD (peripheral vascular disease) ([place]) 12/22/2016   Unstable angina ([place])    Essential hypertension 01/10/2016   CAD S/P percutaneous coronary angioplasty 01/10/2016   Type 2 diabetes mellitus with stage 2 chronic kidney disease, without long-term current use of insulin ([place]) 01/10/2016   Hyperlipidemia associated with type 2 diabetes mellitus ([place]) 01/10/2016   Chronic gout without tophus 01/10/2016   Chronic nonseasonal allergic rhinitis due to pollen 01/10/2016   DDD (degenerative disc disease), lumbar 01/10/2016    Immunization History  Administered Date(s) Administered   Fluad Quad(high Dose 65+) 01/20/2020   Influenza, High Dose Seasonal PF 01/10/2016, 02/05/2017, 01/29/2018   Moderna Sars-Covid-2 Vaccination 10/03/2019, 11/03/2019   Pneumococcal Conjugate-13 03/17/2015, 05/27/2018    Conditions to be addressed/monitored: HTN, HLD, and DMII  Care Plan : PHARMD MEDICATION MANAGEMENT  Updates made by Lavera Guise, [place] since 02/16/2021 12:00 AM     Problem: DISEASE PROGRESSION PREVENTION      Long-Range Goal: HYPERLIPIDEMIA, HYPERTENSION   Note:   Current Barriers:  Unable to independently afford treatment regimen Unable to  achieve control of HLD  Suboptimal therapeutic regimen for HLD  Pharmacist Clinical Goal(s):  patient will verbalize ability to afford treatment regimen achieve control of HLD, HTN as evidenced by GOAL LIPID PANEL, GOAL BP<130/80  through collaboration with PharmD and provider.    Interventions: 1:1 collaboration with Loman Brooklyn, FNP regarding development and update of comprehensive plan of care as evidenced by provider attestation and co-signature Inter-disciplinary care team collaboration (see longitudinal  plan of care) Comprehensive medication review performed; medication list updated in electronic medical record  Hypertension:  New goal. Uncontrolled; current treatment:LISINOPRIL, COREG;  Current home readings: 150/90s Denies hypotensive/hypertensive symptoms Educated on ENCOURAGED PATIENT TO QUIT SMOKING, MAY HAVE TO INCREASE BP MEDS; GOAL <130/80; RECHECK AT PCP F/U  Hyperlipidemia:  New goal. Uncontrolled; current treatment:PRAVASTATIN, NEXLIZET;  Lipid Panel     Component Value Date/Time   CHOL 139 12/29/2020 1412   TRIG 136 12/29/2020 1412   HDL 29 (L) 12/29/2020 1412   CHOLHDL 4.8 12/29/2020 1412   LDLCALC 86 12/29/2020 1412   LABVLDL 24 12/29/2020 1412  WILL TRANSITION FROM NEXLIZET TO REPATHA APPLICATION FOR [facility] SUBMITTED Current dietary patterns: ENCOURAGED HEART HEALTHY DIET Recommended REPATHA Assessed patient finances. [place]    Patient Goals/Self-Care Activities patient will:  - take medications as prescribed as evidenced by patient report and record review check blood pressure DAILY, document, and provide at future appointments engage in dietary modifications by HEART HEALTHY DIET      Medication Assistance:  [place])  Patient's preferred pharmacy is:  [facility] [address] [place] [address] Phone: [phone] Fax:  [phone]   Follow Up:  Patient agrees to Care Plan and Follow-up.  Plan: Telephone follow up appointment with care management team member scheduled for:  3 MONTHS   Regina Eck, PharmD, BCPS Clinical Pharmacist, [place]  II Phone [phone]

## 2021-02-14 NOTE — Telephone Encounter (Signed)
PA Case: [id number], Status: Approved, Coverage Starts on: 02/11/2021 12:00:00 AM, Coverage Ends on: 08/10/2021 12:00:00 AM. Questions? Contact [phone].  Pharmacy aware.

## 2021-02-15 DIAGNOSIS — M5136 Other intervertebral disc degeneration, lumbar region: Secondary | ICD-10-CM | POA: Diagnosis not present

## 2021-02-15 DIAGNOSIS — Z79899 Other long term (current) drug therapy: Secondary | ICD-10-CM | POA: Diagnosis not present

## 2021-02-15 DIAGNOSIS — F17210 Nicotine dependence, cigarettes, uncomplicated: Secondary | ICD-10-CM | POA: Diagnosis not present

## 2021-02-15 DIAGNOSIS — I10 Essential (primary) hypertension: Secondary | ICD-10-CM | POA: Diagnosis not present

## 2021-02-15 DIAGNOSIS — M1612 Unilateral primary osteoarthritis, left hip: Secondary | ICD-10-CM | POA: Diagnosis not present

## 2021-02-15 DIAGNOSIS — N183 Chronic kidney disease, stage 3 unspecified: Secondary | ICD-10-CM | POA: Diagnosis not present

## 2021-02-16 ENCOUNTER — Ambulatory Visit: Payer: Medicare PPO | Admitting: Gastroenterology

## 2021-02-16 ENCOUNTER — Ambulatory Visit: Payer: Medicare PPO | Admitting: Internal Medicine

## 2021-02-16 ENCOUNTER — Encounter: Payer: Self-pay | Admitting: Internal Medicine

## 2021-02-16 NOTE — Patient Instructions (Addendum)
Visit Information  Thank you for taking time to visit with me today. Please don't hesitate to contact me if I can be of assistance to you before our next scheduled telephone appointment.  Following are the goals we discussed today:  Current Barriers:  Unable to independently afford treatment regimen Unable to achieve control of HLD  Suboptimal therapeutic regimen for HLD  Pharmacist Clinical Goal(s):  patient will verbalize ability to afford treatment regimen achieve control of HLD, HTN as evidenced by GOAL LIPID PANEL, GOAL BP<130/80 through collaboration with PharmD and provider.    Interventions: 1:1 collaboration with Loman Brooklyn, FNP regarding development and update of comprehensive plan of care as evidenced by provider attestation and co-signature Inter-disciplinary care team collaboration (see longitudinal plan of care) Comprehensive medication review performed; medication list updated in electronic medical record  Hypertension:  New goal. Uncontrolled; current treatment:LISINOPRIL, COREG;  Current home readings: 150/90s Denies hypotensive/hypertensive symptoms Educated on ENCOURAGED PATIENT TO QUIT SMOKING, [place] BP MEDS; GOAL <130/80; RECHECK AT PCP F/U  Hyperlipidemia:  New goal. Uncontrolled; current treatment:PRAVASTATIN, NEXLIZET;  Lipid Panel     Component Value Date/Time   CHOL 139 12/29/2020 1412   TRIG 136 12/29/2020 1412   HDL 29 (L) 12/29/2020 1412   CHOLHDL 4.8 12/29/2020 1412   LDLCALC 86 12/29/2020 1412   LABVLDL 24 12/29/2020 1412  WILL TRANSITION FROM NEXLIZET TO REPATHA APPLICATION FOR [facility] SUBMITTED Current dietary patterns: ENCOURAGED HEART HEALTHY DIET Recommended REPATHA Assessed patient finances. [facility]    Patient Goals/Self-Care Activities patient will:  - take medications as prescribed as evidenced by patient report and record review check blood pressure DAILY, document, and provide at  future appointments engage in dietary modifications by [place] next appointment is by telephone in 3 months  Please call the care guide team at [phone] if you need to cancel or reschedule your appointment.   The patient verbalized understanding of instructions, educational materials, and care plan provided today and declined offer to receive copy of patient instructions, educational materials, and care plan.    Regina Eck, PharmD, BCPS Clinical Pharmacist, [place]  II Phone [phone]

## 2021-02-21 ENCOUNTER — Telehealth: Payer: Self-pay

## 2021-02-21 NOTE — Telephone Encounter (Signed)
PA - patient has been on medication and PA has been approved previously.

## 2021-02-21 NOTE — Telephone Encounter (Signed)
Received PA for Nexlizet 180-10mg  tablets   KEY- [id number]  PA NOT required  Ezetimibe   Would you like to change medication or start PA?

## 2021-02-23 DIAGNOSIS — E785 Hyperlipidemia, unspecified: Secondary | ICD-10-CM

## 2021-02-23 DIAGNOSIS — I10 Essential (primary) hypertension: Secondary | ICD-10-CM

## 2021-02-28 NOTE — Telephone Encounter (Signed)
Medication was d/c by Almyra Free- please advise if patient needs PA still or if you have D/C'd medication

## 2021-03-01 NOTE — Telephone Encounter (Signed)
Patient aware and he was able to get the repatha.

## 2021-03-10 ENCOUNTER — Other Ambulatory Visit: Payer: Self-pay | Admitting: Family Medicine

## 2021-03-10 DIAGNOSIS — I10 Essential (primary) hypertension: Secondary | ICD-10-CM

## 2021-03-10 DIAGNOSIS — Z9861 Coronary angioplasty status: Secondary | ICD-10-CM

## 2021-03-10 DIAGNOSIS — K219 Gastro-esophageal reflux disease without esophagitis: Secondary | ICD-10-CM

## 2021-03-10 NOTE — Telephone Encounter (Signed)
[place] NTBS 30 days given 02/03/21

## 2021-03-11 NOTE — Telephone Encounter (Signed)
Pt scheduled for 03/31/2021

## 2021-03-18 ENCOUNTER — Other Ambulatory Visit: Payer: Self-pay | Admitting: Family Medicine

## 2021-03-31 ENCOUNTER — Ambulatory Visit: Payer: Medicare PPO | Admitting: Family Medicine

## 2021-04-04 ENCOUNTER — Other Ambulatory Visit: Payer: Self-pay | Admitting: Family Medicine

## 2021-04-04 ENCOUNTER — Encounter: Payer: Self-pay | Admitting: Family Medicine

## 2021-04-04 DIAGNOSIS — I10 Essential (primary) hypertension: Secondary | ICD-10-CM

## 2021-04-04 DIAGNOSIS — I2510 Atherosclerotic heart disease of native coronary artery without angina pectoris: Secondary | ICD-10-CM

## 2021-04-04 DIAGNOSIS — Z9861 Coronary angioplasty status: Secondary | ICD-10-CM

## 2021-04-19 ENCOUNTER — Ambulatory Visit (INDEPENDENT_AMBULATORY_CARE_PROVIDER_SITE_OTHER): Payer: Medicare Other | Admitting: Family Medicine

## 2021-04-19 ENCOUNTER — Encounter: Payer: Self-pay | Admitting: Family Medicine

## 2021-04-19 VITALS — BP 164/80 | HR 81 | Temp 97.7°F | Ht 66.0 in | Wt 158.4 lb

## 2021-04-19 DIAGNOSIS — N1832 Chronic kidney disease, stage 3b: Secondary | ICD-10-CM

## 2021-04-19 DIAGNOSIS — I5032 Chronic diastolic (congestive) heart failure: Secondary | ICD-10-CM | POA: Diagnosis not present

## 2021-04-19 DIAGNOSIS — M1A9XX0 Chronic gout, unspecified, without tophus (tophi): Secondary | ICD-10-CM

## 2021-04-19 DIAGNOSIS — E1122 Type 2 diabetes mellitus with diabetic chronic kidney disease: Secondary | ICD-10-CM

## 2021-04-19 DIAGNOSIS — Z9861 Coronary angioplasty status: Secondary | ICD-10-CM

## 2021-04-19 DIAGNOSIS — E1121 Type 2 diabetes mellitus with diabetic nephropathy: Secondary | ICD-10-CM

## 2021-04-19 DIAGNOSIS — I10 Essential (primary) hypertension: Secondary | ICD-10-CM

## 2021-04-19 DIAGNOSIS — Z1211 Encounter for screening for malignant neoplasm of colon: Secondary | ICD-10-CM

## 2021-04-19 DIAGNOSIS — G479 Sleep disorder, unspecified: Secondary | ICD-10-CM

## 2021-04-19 DIAGNOSIS — E785 Hyperlipidemia, unspecified: Secondary | ICD-10-CM

## 2021-04-19 DIAGNOSIS — I2510 Atherosclerotic heart disease of native coronary artery without angina pectoris: Secondary | ICD-10-CM

## 2021-04-19 DIAGNOSIS — N182 Chronic kidney disease, stage 2 (mild): Secondary | ICD-10-CM

## 2021-04-19 DIAGNOSIS — I739 Peripheral vascular disease, unspecified: Secondary | ICD-10-CM

## 2021-04-19 DIAGNOSIS — E1169 Type 2 diabetes mellitus with other specified complication: Secondary | ICD-10-CM

## 2021-04-19 LAB — BAYER DCA HB A1C WAIVED: HB A1C (BAYER DCA - WAIVED): 4.8 % (ref 4.8–5.6)

## 2021-04-19 MED ORDER — TORSEMIDE 20 MG PO TABS: 20.0000 mg | ORAL_TABLET | Freq: Two times a day (BID) | ORAL | 1 refills | Status: DC

## 2021-04-19 MED ORDER — LISINOPRIL 40 MG PO TABS: 40.0000 mg | ORAL_TABLET | Freq: Every day | ORAL | 2 refills | Status: DC

## 2021-04-19 MED ORDER — ALLOPURINOL 100 MG PO TABS: 100.0000 mg | ORAL_TABLET | Freq: Three times a day (TID) | ORAL | 1 refills | Status: DC

## 2021-04-19 MED ORDER — VICTOZA 18 MG/3ML ~~LOC~~ SOPN: 0.6000 mg | PEN_INJECTOR | Freq: Every day | SUBCUTANEOUS | 0 refills | Status: DC

## 2021-04-19 MED ORDER — CLOPIDOGREL BISULFATE 75 MG PO TABS: 75.0000 mg | ORAL_TABLET | Freq: Every day | ORAL | 1 refills | Status: DC

## 2021-04-19 MED ORDER — VICTOZA 18 MG/3ML ~~LOC~~ SOPN: 1.2000 mg | PEN_INJECTOR | Freq: Every day | SUBCUTANEOUS | 0 refills | Status: DC

## 2021-04-19 MED ORDER — CARVEDILOL 25 MG PO TABS: ORAL_TABLET | ORAL | 1 refills | Status: DC

## 2021-04-19 NOTE — Patient Instructions (Addendum)
Please let us know where you got your eye exam or have them send [place] the report.   Melatonin or Zzzquil Pure Zzzzs for sleep.

## 2021-04-19 NOTE — Progress Notes (Signed)
Assessment & Plan:  1-2. Essential hypertension/Chronic heart failure with preserved ejection fraction ([place]) -persistently hypertensive, will increase lisinopril from 20 mg to 40 mg daily - encouraged to continue to monitor blood pressure at home and keep a log - Lipid panel - CBC with Differential/Platelet - CMP14+EGFR - carvedilol (COREG) 25 MG tablet; TAKE ONE (1) TABLET BY MOUTH TWO (2) TIMES DAILY  Dispense: 180 tablet; Refill: 1 - lisinopril (ZESTRIL) 40 MG tablet; Take 1 tablet (40 mg total) by mouth at bedtime.  Dispense: 30 tablet; Refill: 2 - torsemide (DEMADEX) 20 MG tablet; Take 1 tablet (20 mg total) by mouth 2 (two) times daily.  Dispense: 180 tablet; Refill: 1  3-4. Type 2 diabetes mellitus with stage 2 chronic kidney disease, without long-term current use of insulin (HCC)/ Diabetic nephropathy associated with type 2 diabetes mellitus ([place]) Lab Results  Component Value Date   HGBA1C 5.3 10/06/2020   HGBA1C 5.7 04/23/2020   HGBA1C 5.7 01/20/2020    - Diabetes is at goal of A1c < 7. - Medications:  decrease Victoza to 0.6 mg daily - Home glucose monitoring: no - Patient is currently taking a statin. Patient is taking an ACE-inhibitor/ARB.  - Instruction/counseling given: other instruction/counseling: discussed medication changes  Diabetes Health Maintenance Due  Topic Date Due   OPHTHALMOLOGY EXAM  08/26/2020   HEMOGLOBIN A1C  04/08/2021   FOOT EXAM  10/06/2021    Lab Results  Component Value Date   LABMICR 394.1 04/23/2020   LABMICR 77.2 03/11/2019   - Lipid panel - CBC with Differential/Platelet - CMP14+EGFR - Bayer DCA Hb A1c Waived - liraglutide (VICTOZA) 18 MG/3ML SOPN; Inject 1.2 mg into the skin daily.  Dispense: 18 mL; Refill: 0  5. Stage 3b chronic kidney disease ([place]) - labs to assess - CMP14+EGFR  6-8. Hyperlipidemia associated with type 2 diabetes mellitus (HCC)/PAD (peripheral artery disease) (HCC)/PVD (peripheral vascular disease) ([place]) -  continue Repatha, labs to assess - Lipid panel - CMP14+EGFR  9. CAD S/P percutaneous coronary angioplasty - followed by cardiology, continue plavix and Repatha - Lipid panel - CMP14+EGFR - clopidogrel (PLAVIX) 75 MG tablet; Take 1 tablet (75 mg total) by mouth at bedtime.  Dispense: 90 tablet; Refill: 1  10. Chronic gout without tophus, unspecified cause, unspecified site Well controlled on current regimen - allopurinol (ZYLOPRIM) 100 MG tablet; Take 1 tablet (100 mg total) by mouth 3 (three) times daily.  Dispense: 270 tablet; Refill: 1  11. Difficulty sleeping Melatonin or Zzzquil Pure Zzzzs for sleep.  12. Colon cancer screening - Ambulatory referral to Gastroenterology   Return in about 3 months (around 07/18/2021) for HTN, DM.  Tommy Crater, NP Student  I personally was present during the history, physical exam, and medical decision-making activities of this service and have verified that the service and findings are accurately documented in the nurse practitioner student's note.  Hendricks Limes, MSN, APRN, FNP-C [facility]  Subjective:    Patient ID: Tommy Douglas, male    DOB: 10-12-1950, 71 y.o.   MRN: [id number]  Patient Care Team: Tommy Brooklyn, FNP as PCP - General (Family Medicine) Tommy Bowie Alphonse Guild, MD as PCP - Cardiology (Cardiology) Tommy Harp, MD as Consulting Physician (Cardiology) Tommy Romney Cristopher Estimable, MD as Consulting Physician (Gastroenterology) Tommy Douglas, [facility] (Pharmacist) Tommy Loser, MD as Referring Physician (Orthopedic Surgery) [facility]   Chief Complaint:  Chief Complaint  Patient presents with   Medical Management of Chronic Issues  HPI: Tommy Douglas is a 71 y.o. male presenting on 04/19/2021 for Medical Management of Chronic Issues  Diabetes: Patient presents for follow up of diabetes. Current symptoms include: none. Known diabetic complications: nephropathy. Medication compliance: Yes.  Current diet: in general, a "healthy" diet  . Current exercise: none. Home blood sugar records: BGs are running  consistent with Hgb A1C. Is he  on ACE inhibitor or angiotensin II receptor blocker? Yes (Lisinopril). Is he on a statin? No (intolerant); he is using Repatha. He states he had his annual eye exam last month.  Hypertension: Patient reports his blood pressure at home is high as well, rarely getting readings below 528 systolic.  He feels it is due to the amount of pain he is in.   Hyperlipidemia: controlled with Repatha.  New complaints: He reports difficulty sleeping for the last several months. He states he will often go to bed around 11 pm and wake around 2 am and then stay awake. He states if he naps in the evenings he does not sleep at night at all. He has not taken anything OTC for sleep since his cardiologist told him not to take anything OTC without discussing with a provider first.    Social history:  Relevant past medical, surgical, family and social history reviewed and updated as indicated. Interim medical history since our last visit reviewed.  Allergies and medications reviewed and updated.  DATA REVIEWED: CHART IN EPIC  ROS: Negative unless specifically indicated above in HPI.    Current Outpatient Medications:    albuterol (VENTOLIN HFA) 108 (90 Base) MCG/ACT inhaler, Inhale 2 puffs into the lungs every 6 (six) hours as needed for wheezing or shortness of breath. (Patient taking differently: Inhale 2 puffs into the lungs as needed for wheezing or shortness of breath.), Disp: 18 g, Rfl: 5   allopurinol (ZYLOPRIM) 100 MG tablet, Take 1 tablet (100 mg total) by mouth 3 (three) times daily., Disp: 270 tablet, Rfl: 1   aspirin EC 81 MG tablet, Take 81 mg by mouth at bedtime. , Disp: , Rfl:    carvedilol (COREG) 25 MG tablet, TAKE ONE (1) TABLET BY MOUTH TWO (2) TIMES DAILY, Disp: 60 tablet, Rfl: 0   cetirizine (ZYRTEC) 10 MG tablet, Take 1 tablet (10 mg total) by mouth  daily as needed (for allergies.)., Disp: 90 tablet, Rfl: 3   clopidogrel (PLAVIX) 75 MG tablet, Take 1 tablet (75 mg total) by mouth at bedtime., Disp: 90 tablet, Rfl: 1   cyclobenzaprine (FLEXERIL) 10 MG tablet, TAKE 1 TABLET AT BEDTIME AS NEEDED FOR MUSCLE SPASMS, Disp: 90 tablet, Rfl: 1   Evolocumab (REPATHA SURECLICK) 413 MG/ML SOAJ, Inject 140 mg into the skin every 14 (fourteen) days. STOP NEXLIZET, Disp: 2 mL, Rfl: 6   HYDROcodone-acetaminophen (NORCO) 10-325 MG tablet, Take 1 tablet by mouth 4 (four) times daily., Disp: , Rfl:    Insulin Pen Needle (GNP ULTICARE PEN NEEDLES) 32G X 6 MM MISC, Use to give insulin daily Dx E11.22, Disp: 100 each, Rfl: 0   isosorbide mononitrate (IMDUR) 60 MG 24 hr tablet, TAKE ONE (1) TABLET BY MOUTH EVERY DAY, Disp: 30 tablet, Rfl: 0   lisinopril (ZESTRIL) 20 MG tablet, Take 1 tablet (20 mg total) by mouth at bedtime., Disp: 30 tablet, Rfl: 6   Multiple Vitamin (MULTIVITAMIN WITH MINERALS) TABS tablet, Take 1 tablet by mouth at bedtime. Centrum Silver, Disp: , Rfl:    nitroGLYCERIN (NITROSTAT) 0.4 MG SL tablet, Place 1 tablet (0.4 mg  total) under the tongue every 5 (five) minutes as needed for chest pain. (Patient taking differently: Place 0.4 mg under the tongue as directed.), Disp: 25 tablet, Rfl: 3   pregabalin (LYRICA) 75 MG capsule, Take 1 capsule (75 mg total) by mouth 3 (three) times daily. New lower RENAL DOSING, Disp: 90 capsule, Rfl: 5   torsemide (DEMADEX) 20 MG tablet, Take 1 tablet (20 mg total) by mouth 2 (two) times daily., Disp: 180 tablet, Rfl: 1   VICTOZA 18 MG/3ML SOPN, INJECT 0.3ML (1.8MG) SQ DAILY, Disp: 27 mL, Rfl: 0   pravastatin (PRAVACHOL) 80 MG tablet, Take 1 tablet (80 mg total) by mouth every evening. (Patient not taking: Reported on 04/19/2021), Disp: 90 tablet, Rfl: 1   Allergies  Allergen Reactions   Atorvastatin     All statins make him cough   Statins Cough    Pt is currently taking pravastatin    Sulfa Antibiotics     Lexapro [Escitalopram] Palpitations   Past Medical History:  Diagnosis Date   Anxiety    Arthritis    "qwhere" (12/20/2016)   Chronic lower back pain    CKD (chronic kidney disease), stage IV (HCC)    Complication of anesthesia    "got the wrong kind of anesthesia ~ 2000 when they were looking down into my stomach"   Coronary artery disease    Diabetic nephropathy ([place])    Gout    Heart disease    Hyperlipidemia    Hypertension    Pneumonia ~ 2015   Seasonal allergies    Type II diabetes mellitus ([place])     Past Surgical History:  Procedure Laterality Date   ABDOMINAL AORTOGRAM W/LOWER EXTREMITY N/A 06/02/2019   Procedure: ABDOMINAL AORTOGRAM W/LOWER EXTREMITY;  Surgeon: Tommy Harp, MD;  Location: [facility];  Service: Cardiovascular;  Laterality: N/A;   CARDIAC CATHETERIZATION  12/20/2016   CORONARY ANGIOPLASTY WITH STENT PLACEMENT  2004 X 2   "1st stent moved"   CORONARY STENT INTERVENTION N/A 12/21/2016   Procedure: CORONARY STENT INTERVENTION;  Surgeon: Burnell Blanks, MD;  Location: [place] [facility] [place] [facility];  Service: Cardiovascular;  Laterality: N/A;   ESOPHAGOGASTRODUODENOSCOPY  ~ 2000   LAPAROSCOPIC CHOLECYSTECTOMY     LEFT HEART CATH AND CORONARY ANGIOGRAPHY N/A 12/20/2016   Procedure: LEFT HEART CATH AND CORONARY ANGIOGRAPHY;  Surgeon: Burnell Blanks, MD;  Location: [place] [facility];  Service: Cardiovascular;  Laterality: N/A;    Social History   Socioeconomic History   Marital status: Widowed    Spouse name: Not on file   Number of children: 7   Years of education: Not on file   Highest education level: High school graduate  Occupational History   Occupation: retired     Comment: Designer, television/film set and Manufacturing engineer co.  Tobacco Use   Smoking status: Every Day    Packs/day: 0.50    Years: 44.00    Pack years: 22.00    Types: Cigarettes   Smokeless tobacco: Never  Vaping Use   Vaping Use: Never used  Substance and Sexual  Activity   Alcohol use: No   Drug use: No   Sexual activity: Not Currently  Other Topics Concern   Not on file  Social History Narrative   Not on file   Social Determinants of Health   Financial Resource Strain: Not on file  Food Insecurity: Not on file  Transportation Needs: Not on file  Physical Activity: Not on file  Stress:  Not on file  Social Connections: Not on file  Intimate Partner Violence: Not on file        Objective:    BP (!) 164/80    Pulse 81    Temp 97.7 F (36.5 C) (Temporal)    Ht 5' 6"  (1.676 m)    Wt 71.8 kg    SpO2 98%    BMI 25.57 kg/m   Wt Readings from Last 3 Encounters:  04/19/21 71.8 kg  12/23/20 72.9 kg  12/16/20 73.5 kg    Physical Exam Vitals reviewed.  Constitutional:      General: He is not in acute distress.    Appearance: Normal appearance. He is overweight. He is not ill-appearing, toxic-appearing or diaphoretic.  HENT:     Head: Normocephalic and atraumatic.  Eyes:     General: No scleral icterus.       Right eye: No discharge.        Left eye: No discharge.     Conjunctiva/sclera: Conjunctivae normal.  Cardiovascular:     Rate and Rhythm: Normal rate and regular rhythm.     Heart sounds: Normal heart sounds. No murmur heard.   No friction rub. No gallop.  Pulmonary:     Effort: Pulmonary effort is normal. No respiratory distress.     Breath sounds: Normal breath sounds. No stridor. No wheezing, rhonchi or rales.  Musculoskeletal:        General: Normal range of motion.     Cervical back: Normal range of motion.     Left hip: Tenderness (with internal rotation) present.     Right lower leg: No edema.     Left lower leg: 2+ Edema present.  Skin:    General: Skin is warm and dry.  Neurological:     Mental Status: He is alert and oriented to person, place, and time. Mental status is at baseline.  Psychiatric:        Mood and Affect: Mood normal.        Behavior: Behavior normal.        Thought Content: Thought content  normal.        Judgment: Judgment normal.    Lab Results  Component Value Date   TSH 1.720 03/11/2019   Lab Results  Component Value Date   WBC 8.6 10/06/2020   HGB 12.3 (L) 10/06/2020   HCT 38.8 10/06/2020   MCV 88 10/06/2020   PLT 240 10/06/2020   Lab Results  Component Value Date   NA 143 12/29/2020   K 4.2 12/29/2020   CO2 26 12/29/2020   GLUCOSE 98 12/29/2020   BUN 13 12/29/2020   CREATININE 1.61 (H) 12/29/2020   BILITOT 0.6 10/06/2020   ALKPHOS 118 10/06/2020   AST 12 10/06/2020   ALT 11 10/06/2020   PROT 6.6 10/06/2020   ALBUMIN 4.0 10/06/2020   CALCIUM 9.5 12/29/2020   ANIONGAP 10 11/04/2019   EGFR 46 (L) 12/29/2020   Lab Results  Component Value Date   CHOL 139 12/29/2020   Lab Results  Component Value Date   HDL 29 (L) 12/29/2020   Lab Results  Component Value Date   LDLCALC 86 12/29/2020   Lab Results  Component Value Date   TRIG 136 12/29/2020   Lab Results  Component Value Date   CHOLHDL 4.8 12/29/2020   Lab Results  Component Value Date   HGBA1C 5.3 10/06/2020

## 2021-04-20 DIAGNOSIS — M1612 Unilateral primary osteoarthritis, left hip: Secondary | ICD-10-CM | POA: Insufficient documentation

## 2021-04-20 DIAGNOSIS — F528 Other sexual dysfunction not due to a substance or known physiological condition: Secondary | ICD-10-CM | POA: Insufficient documentation

## 2021-04-20 LAB — CBC WITH DIFFERENTIAL/PLATELET
Basophils Absolute: 0.1 10*3/uL (ref 0.0–0.2)
Basos: 2 %
EOS (ABSOLUTE): 0.3 10*3/uL (ref 0.0–0.4)
Eos: 4 %
Hematocrit: 41.9 % (ref 37.5–51.0)
Hemoglobin: 13.8 g/dL (ref 13.0–17.7)
Immature Grans (Abs): 0 10*3/uL (ref 0.0–0.1)
Immature Granulocytes: 0 %
Lymphocytes Absolute: 1.4 10*3/uL (ref 0.7–3.1)
Lymphs: 22 %
MCH: 28 pg (ref 26.6–33.0)
MCHC: 32.9 g/dL (ref 31.5–35.7)
MCV: 85 fL (ref 79–97)
Monocytes Absolute: 0.7 10*3/uL (ref 0.1–0.9)
Monocytes: 11 %
Neutrophils Absolute: 4 10*3/uL (ref 1.4–7.0)
Neutrophils: 61 %
Platelets: 269 10*3/uL (ref 150–450)
RBC: 4.92 x10E6/uL (ref 4.14–5.80)
RDW: 13.3 % (ref 11.6–15.4)
WBC: 6.5 10*3/uL (ref 3.4–10.8)

## 2021-04-20 LAB — LIPID PANEL
Chol/HDL Ratio: 3.3 ratio (ref 0.0–5.0)
Cholesterol, Total: 108 mg/dL (ref 100–199)
HDL: 33 mg/dL — ABNORMAL LOW (ref >39)
LDL Chol Calc (NIH): 54 mg/dL (ref 0–99)
Triglycerides: 111 mg/dL (ref 0–149)
VLDL Cholesterol Cal: 21 mg/dL (ref 5–40)

## 2021-04-20 LAB — CMP14+EGFR
ALT: 11 IU/L (ref 0–44)
AST: 20 IU/L (ref 0–40)
Albumin/Globulin Ratio: 1.4 (ref 1.2–2.2)
Albumin: 4.2 g/dL (ref 3.8–4.8)
Alkaline Phosphatase: 120 IU/L (ref 44–121)
BUN/Creatinine Ratio: 8 — ABNORMAL LOW (ref 10–24)
BUN: 13 mg/dL (ref 8–27)
Bilirubin Total: 0.5 mg/dL (ref 0.0–1.2)
CO2: 25 mmol/L (ref 20–29)
Calcium: 9.7 mg/dL (ref 8.6–10.2)
Chloride: 106 mmol/L (ref 96–106)
Creatinine, Ser: 1.63 mg/dL — ABNORMAL HIGH (ref 0.76–1.27)
Globulin, Total: 2.9 g/dL (ref 1.5–4.5)
Glucose: 135 mg/dL — ABNORMAL HIGH (ref 70–99)
Potassium: 4.4 mmol/L (ref 3.5–5.2)
Sodium: 146 mmol/L — ABNORMAL HIGH (ref 134–144)
Total Protein: 7.1 g/dL (ref 6.0–8.5)
eGFR: 45 mL/min/{1.73_m2} — ABNORMAL LOW (ref >59)

## 2021-04-21 ENCOUNTER — Encounter: Payer: Self-pay | Admitting: Internal Medicine

## 2021-04-25 ENCOUNTER — Encounter: Payer: Self-pay | Admitting: *Deleted

## 2021-04-25 ENCOUNTER — Ambulatory Visit: Payer: Medicare Other | Admitting: Cardiology

## 2021-04-25 ENCOUNTER — Encounter: Payer: Self-pay | Admitting: Cardiology

## 2021-04-25 VITALS — BP 160/74 | HR 84 | Ht 66.0 in | Wt 160.6 lb

## 2021-04-25 DIAGNOSIS — E782 Mixed hyperlipidemia: Secondary | ICD-10-CM | POA: Diagnosis not present

## 2021-04-25 DIAGNOSIS — I10 Essential (primary) hypertension: Secondary | ICD-10-CM

## 2021-04-25 DIAGNOSIS — I2510 Atherosclerotic heart disease of native coronary artery without angina pectoris: Secondary | ICD-10-CM | POA: Diagnosis not present

## 2021-04-25 DIAGNOSIS — R0989 Other specified symptoms and signs involving the circulatory and respiratory systems: Secondary | ICD-10-CM

## 2021-04-25 MED ORDER — AMLODIPINE BESYLATE 5 MG PO TABS: 5.0000 mg | ORAL_TABLET | Freq: Every day | ORAL | 1 refills | Status: DC

## 2021-04-25 NOTE — Patient Instructions (Addendum)
Medication Instructions:  Your physician has recommended you make the following change in your medication:  Start amlodipine 5 mg daily Continue other medications the same  Labwork: none  Testing/Procedures: Your physician has requested that you have a lexiscan myoview. For further information please visit [email or web address]. Please follow instruction sheet, as given. Your physician has requested that you have a carotid duplex. This test is an ultrasound of the carotid arteries in your neck. It looks at blood flow through these arteries that supply the brain with blood. Allow one hour for this exam. There are no restrictions or special instructions.  Follow-Up: Your physician recommends that you schedule a follow-up appointment in: 6 months  Any Other Special Instructions Will Be Listed Below (If Applicable).  If you need a refill on your cardiac medications before your next appointment, please call your pharmacy.

## 2021-04-25 NOTE — Progress Notes (Addendum)
Clinical Summary Mr. Kratochvil is a 71 y.o.male seen today for follow up of the following medical problems.    1. CAD - history of prior LAD and RCA stenting.  - last intervention 11/2016 with DES to mid RCA, small vessel residual disease PDA and PL branches too small for intervention. Subtotal LCX with collaterals   - no recent chest pains. No SOB/DOE - exertion severly limited by hip.  - compliant with meds   2. PAD - followed by Dr Gwenlyn Found - prior left exertnal iliac stent.    12/2019 LE arterial US: 30-49% mid and distal SFA. Patent left prox mid exteranl iliac stent.  12/2019 ABI: 0.9 Left 1.03 though noncompressible - some recent ongoing left groin pain  - has not wanted to follow up.      3. Hyperlipidemia - 08/2019 TC 120 TG 218 HDL 21 LDL 63 - side effects on more potent statins.    -recent significant elevation in LDL - he is on pravastatin 80mg  daily, bemepdoic acid/zetia -12/2020 TC 139 TG 136 HDL 29 LDL 86 - he was then started on repatha.  - Jan 2023 TC 108 TG 111 HDL 33 LDL 54      4. Chronic diastolic HF - no recent edema. Taking torsemide 20mg  bid.    5. CKD 3 - Cr has been stable     6. HTN - home 160s/70s - pcp increased lisinopril 40mg  daily just a few days ago.     7. Preoperativ evaluation - considering hip replacement., Feb 21.  - cannot assess exercise capacity by history, severely limited by hip pain.    Past Medical History:  Diagnosis Date   Anxiety    Arthritis    "qwhere" (12/20/2016)   Chronic lower back pain    CKD (chronic kidney disease), stage IV (HCC)    Complication of anesthesia    "got the wrong kind of anesthesia ~ 2000 when they were looking down into my stomach"   Coronary artery disease    Diabetic nephropathy ([place])    Gout    Heart disease    Hyperlipidemia    Hypertension    Pneumonia ~ 2015   Seasonal allergies    Type II diabetes mellitus (HCC)      Allergies  Allergen Reactions   Atorvastatin      All statins make him cough   Statins Cough    Pt is currently taking pravastatin    Sulfa Antibiotics    Lexapro [Escitalopram] Palpitations     Current Outpatient Medications  Medication Sig Dispense Refill   albuterol (VENTOLIN HFA) 108 (90 Base) MCG/ACT inhaler Inhale 2 puffs into the lungs every 6 (six) hours as needed for wheezing or shortness of breath. (Patient taking differently: Inhale 2 puffs into the lungs as needed for wheezing or shortness of breath.) 18 g 5   allopurinol (ZYLOPRIM) 100 MG tablet Take 1 tablet (100 mg total) by mouth 3 (three) times daily. 270 tablet 1   aspirin EC 81 MG tablet Take 81 mg by mouth at bedtime.      carvedilol (COREG) 25 MG tablet TAKE ONE (1) TABLET BY MOUTH TWO (2) TIMES DAILY 180 tablet 1   cetirizine (ZYRTEC) 10 MG tablet Take 1 tablet (10 mg total) by mouth daily as needed (for allergies.). 90 tablet 3   clopidogrel (PLAVIX) 75 MG tablet Take 1 tablet (75 mg total) by mouth at bedtime. 90 tablet 1   cyclobenzaprine (FLEXERIL) 10  MG tablet TAKE 1 TABLET AT BEDTIME AS NEEDED FOR MUSCLE SPASMS 90 tablet 1   Evolocumab (REPATHA SURECLICK) 829 MG/ML SOAJ Inject 140 mg into the skin every 14 (fourteen) days. STOP NEXLIZET 2 mL 6   HYDROcodone-acetaminophen (NORCO) 10-325 MG tablet Take 1 tablet by mouth 4 (four) times daily.     Insulin Pen Needle (GNP ULTICARE PEN NEEDLES) 32G X 6 MM MISC Use to give insulin daily Dx E11.22 100 each 0   isosorbide mononitrate (IMDUR) 60 MG 24 hr tablet TAKE ONE (1) TABLET BY MOUTH EVERY DAY 30 tablet 0   liraglutide (VICTOZA) 18 MG/3ML SOPN Inject 0.6 mg into the skin daily. 9 mL 0   lisinopril (ZESTRIL) 40 MG tablet Take 1 tablet (40 mg total) by mouth at bedtime. 30 tablet 2   Multiple Vitamin (MULTIVITAMIN WITH MINERALS) TABS tablet Take 1 tablet by mouth at bedtime. Centrum Silver     nitroGLYCERIN (NITROSTAT) 0.4 MG SL tablet Place 1 tablet (0.4 mg total) under the tongue every 5 (five) minutes as needed for  chest pain. (Patient taking differently: Place 0.4 mg under the tongue as directed.) 25 tablet 3   pregabalin (LYRICA) 75 MG capsule Take 1 capsule (75 mg total) by mouth 3 (three) times daily. New lower RENAL DOSING 90 capsule 5   torsemide (DEMADEX) 20 MG tablet Take 1 tablet (20 mg total) by mouth 2 (two) times daily. 180 tablet 1   No current facility-administered medications for this visit.     Past Surgical History:  Procedure Laterality Date   ABDOMINAL AORTOGRAM W/LOWER EXTREMITY N/A 06/02/2019   Procedure: ABDOMINAL AORTOGRAM W/LOWER EXTREMITY;  Surgeon: Lorretta Harp, MD;  Location: [facility];  Service: Cardiovascular;  Laterality: N/A;   CARDIAC CATHETERIZATION  12/20/2016   CORONARY ANGIOPLASTY WITH STENT PLACEMENT  2004 X 2   "1st stent moved"   CORONARY STENT INTERVENTION N/A 12/21/2016   Procedure: CORONARY STENT INTERVENTION;  Surgeon: Burnell Blanks, MD;  Location: [facility];  Service: Cardiovascular;  Laterality: N/A;   ESOPHAGOGASTRODUODENOSCOPY  ~ 2000   LAPAROSCOPIC CHOLECYSTECTOMY     LEFT HEART CATH AND CORONARY ANGIOGRAPHY N/A 12/20/2016   Procedure: LEFT HEART CATH AND CORONARY ANGIOGRAPHY;  Surgeon: Burnell Blanks, MD;  Location: [facility];  Service: Cardiovascular;  Laterality: N/A;     Allergies  Allergen Reactions   Atorvastatin     All statins make him cough   Statins Cough    Pt is currently taking pravastatin    Sulfa Antibiotics    Lexapro [Escitalopram] Palpitations      Family History  Problem Relation Age of Onset   Cancer Mother        male   Diabetes Mother    Stroke Father        x3   Stroke Brother        x2    Other Sister        bowel necrosis?    Other Daughter        gout   Other Son        gout   Alcohol abuse Brother    Other Sister        pneumonia   Other Sister        pneumonia   Cancer Daughter        male   Other Daughter        fluid on brain - disabled  Social History Mr. Krysiak reports that he has been smoking cigarettes. He has a 22.00 pack-year smoking history. He has never used smokeless tobacco. Mr. Tal reports no history of alcohol use.   Review of Systems CONSTITUTIONAL: No weight loss, fever, chills, weakness or fatigue.  HEENT: Eyes: No visual loss, blurred vision, double vision or yellow sclerae.No hearing loss, sneezing, congestion, runny nose or sore throat.  SKIN: No rash or itching.  CARDIOVASCULAR: per hpi RESPIRATORY: No shortness of breath, cough or sputum.  GASTROINTESTINAL: No anorexia, nausea, vomiting or diarrhea. No abdominal pain or blood.  GENITOURINARY: No burning on urination, no polyuria NEUROLOGICAL: No headache, dizziness, syncope, paralysis, ataxia, numbness or tingling in the extremities. No change in bowel or bladder control.  MUSCULOSKELETAL: No muscle, back pain, joint pain or stiffness.  LYMPHATICS: No enlarged nodes. No history of splenectomy.  PSYCHIATRIC: No history of depression or anxiety.  ENDOCRINOLOGIC: No reports of sweating, cold or heat intolerance. No polyuria or polydipsia.  [name]   Physical Examination Today's Vitals   04/25/21 1257  BP: (!) 160/74  Pulse: 84  SpO2: 98%  Weight: 160 lb 9.6 oz (72.8 kg)  Height: 5\' 6"  (1.676 m)   Body mass index is 25.92 kg/m.  Gen: resting comfortably, no acute distress HEENT: no scleral icterus, pupils equal round and reactive, no palptable cervical adenopathy,  CV: RRR, no m/rg, no jvd. Bilateral carotid bruits Resp: Clear to auscultation bilaterally GI: abdomen is soft, non-tender, non-distended, normal bowel sounds, no hepatosplenomegaly MSK: extremities are warm, no edema.  Skin: warm, no rash Neuro:  no focal deficits Psych: appropriate affect   Diagnostic Studies  12/2019 LE [place] Summary:  Left: No significant change as compared to previous study. Heterogenous  plaque throughout.  30-49% stenosis in the mid and distal  SFA.   No significant findings noted in the left groin during supine and standing  positions. There is a lymph node at area of pain, measuring 1.5 x .90 x  .98 cm. This lymph node appears to be normal. Inguinal hernia not evident,  but can not be excluded.    09/2017 echo Study Conclusions   - Procedure narrative: Transthoracic echocardiography. Image    quality was adequate. The study was technically difficult, as a    result of poor patient compliance.  - Left ventricle: The cavity size was normal. There was mild    concentric and moderate basal septal hypertrophy. Systolic    function was normal. The estimated ejection fraction was in the    range of 60% to 65%. Wall motion was normal; there were no    regional wall motion abnormalities. Doppler parameters are    consistent with abnormal left ventricular relaxation (grade 1    diastolic dysfunction). Doppler parameters are consistent with    indeterminate ventricular filling pressure.  - Aortic valve: Mildly calcified annulus. Trileaflet; normal    thickness leaflets.  - Atrial septum: No defect or patent foramen ovale was identified.  - Tricuspid valve: There was mild regurgitation.  - Pericardium, extracardiac: A prominent pericardial fat pad was    present.    11/2016 cath   Ost RCA to Mid RCA lesion, 0 %stenosed. Prox RCA lesion, 40 %stenosed. Mid RCA to Dist RCA lesion, 90 %stenosed. Dist RCA lesion, 99 %stenosed. Ost RPDA lesion, 99 %stenosed. Prox Cx to Mid Cx lesion, 99 %stenosed. Ost 3rd Mrg to 3rd Mrg lesion, 80 %stenosed. Ost LAD to Prox LAD lesion, 10 %stenosed. Prox LAD to Mid LAD  lesion, 40 %stenosed. Dist LAD lesion, 50 %stenosed.   1. Triple vessel CAD 2. The LAD is a large caliber vessel that courses to the apex. The proximal vessel appears to have a stent but this could be heavy calcification. (No previous cath records available). The mid and distal vessel has mild to moderate non-obstructive disease.  3.  The Circumflex has a sub-total occlusion in the proximal segment and fills distally from right to left and left to left collaterals.  4. The RCA is a large dominant vessel with a patent proximal stent. The stented segment has focal moderate restenosis. The mid vessel has severe stenosis. The small to moderate caliber PDA and posterolateral artery have severe stenosis.  5. Normal filling pressures.    Recommendations: He has stage 3 CKD. He has severe CAD. I think we should consider PCI of the RCA and possible the Circumflex artery. Given his renal dysfunction, will admit and hydrate today and plan PCI later this week. Continue ASA/Plavix.    11/2016 cath intervention 1. Unstable angina with inferior wall ischemia on nuclear stress test and severe mid RCA stenosis on diagnostic cath yesterday. Successful PTCA/DES x 1 mid RCA. There is residual disease in the small caliber PDA and posterolateral branches. These vessels are too small for PCI.  2. The IC team has reviewed the Circumflex artery disease. This is a small to moderate caliber artery with sub-total proximal to mid occlusion and diffuse disease throughout with collateral filling. Will not attempt PCI of this vessel.    Continue DAPT for one year with ASA and Plavix.    Assessment and Plan  1. CAD - no recent symptoms, continue current meds   2. PAD - prior stent, recent imaging was normal - has not wanted to f/u, monitor at this time. Could establish with vein and vascular if recurrent issues in the future.    3. Hyperlipidemia -LDL at goal on repatha, prior difficultly tolerating statin - continue currennt meds   4. HTN -bp remaisn elevated, start norvasc 5mg  daily  5. Preoperative evluation - not able to assess exercise capacity based on history alone, severely limited by hip pain - plan for lexiscan to further risk stratify prior to surgery.   6. Carotid bruit -obtain carotid US    05/06/21 addendum Low to intermediate  risk stress test. Recommend proceeding with surgery as planned.   Arnoldo Lenis, M.D.

## 2021-04-26 ENCOUNTER — Telehealth: Payer: Self-pay

## 2021-04-26 NOTE — Telephone Encounter (Signed)
I contacted patient to go over his lab work and he asked that I let you know he saw his cardiologist for surgical clearance for his hip surgery and he was not cleared.  They were concerned about his blood pressure and also a possible blockage in the carotid arteries.  He is scheduled for a carotid doppler in the morning at 8:30 am.

## 2021-04-26 NOTE — Progress Notes (Signed)
RR sent

## 2021-04-27 ENCOUNTER — Encounter (HOSPITAL_COMMUNITY): Payer: Self-pay

## 2021-04-27 ENCOUNTER — Ambulatory Visit (HOSPITAL_COMMUNITY)
Admission: RE | Admit: 2021-04-27 | Discharge: 2021-04-27 | Disposition: A | Discharge status: Home / Self Care | Payer: Medicare Other | Source: Ambulatory Visit | Attending: Cardiology | Admitting: Cardiology

## 2021-04-27 ENCOUNTER — Encounter (HOSPITAL_COMMUNITY)
Admission: RE | Admit: 2021-04-27 | Discharge: 2021-04-27 | Disposition: A | Discharge status: Home / Self Care | Payer: Medicare Other | Source: Ambulatory Visit | Attending: Cardiology | Admitting: Cardiology

## 2021-04-27 ENCOUNTER — Other Ambulatory Visit: Payer: Self-pay

## 2021-04-27 DIAGNOSIS — I2510 Atherosclerotic heart disease of native coronary artery without angina pectoris: Secondary | ICD-10-CM | POA: Insufficient documentation

## 2021-04-27 LAB — NM MYOCAR MULTI W/SPECT W/WALL MOTION / EF
LV dias vol: 114 mL (ref 62–150)
LV sys vol: 49 mL
Nuc Stress EF: 57 %
Peak HR: 98 {beats}/min
RATE: 0.4
Rest HR: 76 {beats}/min
Rest Nuclear Isotope Dose: 11 mCi
SDS: 3
SRS: 3
SSS: 6
ST Depression (mm): 0 mm
Stress Nuclear Isotope Dose: 31 mCi
TID: 1.08

## 2021-04-27 MED ORDER — TECHNETIUM TC 99M TETROFOSMIN IV KIT
10.0000 | PACK | Freq: Once | INTRAVENOUS | Status: AC | PRN
  Administered 2021-04-27: 11 via INTRAVENOUS

## 2021-04-27 MED ORDER — TECHNETIUM TC 99M TETROFOSMIN IV KIT
30.0000 | PACK | Freq: Once | INTRAVENOUS | Status: AC | PRN
  Administered 2021-04-27: 31 via INTRAVENOUS

## 2021-04-27 MED ORDER — REGADENOSON 0.4 MG/5ML IV SOLN
INTRAVENOUS | Status: AC
  Administered 2021-04-27: 0.4 mg via INTRAVENOUS
  Filled 2021-04-27: qty 5

## 2021-04-27 MED ORDER — SODIUM CHLORIDE FLUSH 0.9 % IV SOLN
INTRAVENOUS | Status: AC
  Administered 2021-04-27: 10 mL via INTRAVENOUS
  Filled 2021-04-27: qty 10

## 2021-05-03 ENCOUNTER — Telehealth: Payer: Medicare PPO

## 2021-05-17 ENCOUNTER — Observation Stay (HOSPITAL_COMMUNITY)
Admission: EM | Admit: 2021-05-17 | Discharge: 2021-05-18 | Discharge status: Against Medical Advice | Payer: Medicare Other | Attending: Emergency Medicine | Admitting: Emergency Medicine

## 2021-05-17 ENCOUNTER — Telehealth: Payer: Self-pay | Admitting: Cardiology

## 2021-05-17 ENCOUNTER — Other Ambulatory Visit: Payer: Self-pay

## 2021-05-17 ENCOUNTER — Encounter (HOSPITAL_COMMUNITY): Payer: Self-pay | Admitting: Emergency Medicine

## 2021-05-17 DIAGNOSIS — R0989 Other specified symptoms and signs involving the circulatory and respiratory systems: Secondary | ICD-10-CM

## 2021-05-17 DIAGNOSIS — E119 Type 2 diabetes mellitus without complications: Secondary | ICD-10-CM

## 2021-05-17 DIAGNOSIS — I739 Peripheral vascular disease, unspecified: Secondary | ICD-10-CM | POA: Diagnosis not present

## 2021-05-17 DIAGNOSIS — E1122 Type 2 diabetes mellitus with diabetic chronic kidney disease: Secondary | ICD-10-CM | POA: Insufficient documentation

## 2021-05-17 DIAGNOSIS — F17210 Nicotine dependence, cigarettes, uncomplicated: Secondary | ICD-10-CM | POA: Insufficient documentation

## 2021-05-17 DIAGNOSIS — I130 Hypertensive heart and chronic kidney disease with heart failure and stage 1 through stage 4 chronic kidney disease, or unspecified chronic kidney disease: Secondary | ICD-10-CM | POA: Insufficient documentation

## 2021-05-17 DIAGNOSIS — Z794 Long term (current) use of insulin: Secondary | ICD-10-CM | POA: Diagnosis not present

## 2021-05-17 DIAGNOSIS — N184 Chronic kidney disease, stage 4 (severe): Secondary | ICD-10-CM | POA: Diagnosis not present

## 2021-05-17 DIAGNOSIS — R040 Epistaxis: Secondary | ICD-10-CM | POA: Diagnosis present

## 2021-05-17 DIAGNOSIS — Z79899 Other long term (current) drug therapy: Secondary | ICD-10-CM | POA: Diagnosis not present

## 2021-05-17 DIAGNOSIS — I5032 Chronic diastolic (congestive) heart failure: Secondary | ICD-10-CM | POA: Diagnosis not present

## 2021-05-17 DIAGNOSIS — Z7902 Long term (current) use of antithrombotics/antiplatelets: Secondary | ICD-10-CM | POA: Insufficient documentation

## 2021-05-17 DIAGNOSIS — Z7982 Long term (current) use of aspirin: Secondary | ICD-10-CM | POA: Insufficient documentation

## 2021-05-17 DIAGNOSIS — I10 Essential (primary) hypertension: Secondary | ICD-10-CM | POA: Diagnosis present

## 2021-05-17 DIAGNOSIS — I2510 Atherosclerotic heart disease of native coronary artery without angina pectoris: Secondary | ICD-10-CM | POA: Diagnosis not present

## 2021-05-17 DIAGNOSIS — N1831 Chronic kidney disease, stage 3a: Secondary | ICD-10-CM

## 2021-05-17 DIAGNOSIS — Z20822 Contact with and (suspected) exposure to covid-19: Secondary | ICD-10-CM | POA: Diagnosis not present

## 2021-05-17 DIAGNOSIS — Z955 Presence of coronary angioplasty implant and graft: Secondary | ICD-10-CM | POA: Insufficient documentation

## 2021-05-17 DIAGNOSIS — M1A9XX0 Chronic gout, unspecified, without tophus (tophi): Secondary | ICD-10-CM

## 2021-05-17 DIAGNOSIS — Z9861 Coronary angioplasty status: Secondary | ICD-10-CM

## 2021-05-17 LAB — PROTIME-INR
INR: 1.1 (ref 0.8–1.2)
Prothrombin Time: 13.8 seconds (ref 11.4–15.2)

## 2021-05-17 LAB — CBC WITH DIFFERENTIAL/PLATELET
Abs Immature Granulocytes: 0.02 10*3/uL (ref 0.00–0.07)
Basophils Absolute: 0.1 10*3/uL (ref 0.0–0.1)
Basophils Relative: 1 %
Eosinophils Absolute: 0.2 10*3/uL (ref 0.0–0.5)
Eosinophils Relative: 2 %
HCT: 42.6 % (ref 39.0–52.0)
Hemoglobin: 12.9 g/dL — ABNORMAL LOW (ref 13.0–17.0)
Immature Granulocytes: 0 %
Lymphocytes Relative: 15 %
Lymphs Abs: 1.4 10*3/uL (ref 0.7–4.0)
MCH: 26.5 pg (ref 26.0–34.0)
MCHC: 30.3 g/dL (ref 30.0–36.0)
MCV: 87.5 fL (ref 80.0–100.0)
Monocytes Absolute: 0.7 10*3/uL (ref 0.1–1.0)
Monocytes Relative: 8 %
Neutro Abs: 7 10*3/uL (ref 1.7–7.7)
Neutrophils Relative %: 74 %
Platelets: 267 10*3/uL (ref 150–400)
RBC: 4.87 MIL/uL (ref 4.22–5.81)
RDW: 14.6 % (ref 11.5–15.5)
WBC: 9.4 10*3/uL (ref 4.0–10.5)
nRBC: 0 % (ref 0.0–0.2)

## 2021-05-17 LAB — BASIC METABOLIC PANEL
Anion gap: 9 (ref 5–15)
BUN: 23 mg/dL (ref 8–23)
CO2: 25 mmol/L (ref 22–32)
Calcium: 9.2 mg/dL (ref 8.9–10.3)
Chloride: 104 mmol/L (ref 98–111)
Creatinine, Ser: 1.36 mg/dL — ABNORMAL HIGH (ref 0.61–1.24)
GFR, Estimated: 56 mL/min — ABNORMAL LOW (ref >60)
Glucose, Bld: 163 mg/dL — ABNORMAL HIGH (ref 70–99)
Potassium: 3.9 mmol/L (ref 3.5–5.1)
Sodium: 138 mmol/L (ref 135–145)

## 2021-05-17 MED ORDER — LIDOCAINE-EPINEPHRINE (PF) 2 %-1:200000 IJ SOLN
10.0000 mL | Freq: Once | INTRAMUSCULAR | Status: AC
  Administered 2021-05-17: 10 mL
  Filled 2021-05-17: qty 20

## 2021-05-17 MED ORDER — OXYMETAZOLINE HCL 0.05 % NA SOLN
1.0000 | Freq: Once | NASAL | Status: AC
  Administered 2021-05-17: 1 via NASAL
  Filled 2021-05-17: qty 30

## 2021-05-17 MED ORDER — HYDROCODONE-ACETAMINOPHEN 5-325 MG PO TABS
1.0000 | ORAL_TABLET | Freq: Once | ORAL | Status: AC
  Administered 2021-05-17: 1 via ORAL
  Filled 2021-05-17: qty 1

## 2021-05-17 MED ORDER — AMOXICILLIN-POT CLAVULANATE 875-125 MG PO TABS: 1.0000 | ORAL_TABLET | Freq: Two times a day (BID) | ORAL | 0 refills | Status: DC

## 2021-05-17 MED ORDER — LIDOCAINE HCL (PF) 2 % IJ SOLN
INTRAMUSCULAR | Status: AC
  Filled 2021-05-17: qty 20

## 2021-05-17 NOTE — Telephone Encounter (Signed)
Pt says that his nose will not bleeding... he is in route to [facility]... please advise

## 2021-05-17 NOTE — Telephone Encounter (Signed)
Patient stated that he is on his way to [facility] now.    Was at [facility] this morning for epistaxis - see care everywhere.

## 2021-05-17 NOTE — Discharge Instructions (Addendum)
Please go directly to [facility] Emergency Department to be evaluated by the ENT specialist for your continued nose bleed.

## 2021-05-17 NOTE — ED Triage Notes (Signed)
Pt to the ED with complaints of a nosebleed since 0600 this morning.  Pt states his heart doctor recommended he come to the ED.

## 2021-05-17 NOTE — ED Provider Notes (Cosign Needed Addendum)
[facility] EMERGENCY DEPARTMENT Provider Note   CSN: [id number] Arrival date & time: 05/17/21  1532     History  Chief Complaint  Patient presents with   Epistaxis    Tommy Douglas is a 71 y.o. male who presents to the ED today with complaint of epistaxis from left naris that began this morning.  Patient states he has been unable to control the bleeding at home.  Incidentally his significant other mentions that he was at the [facility] ED earlier today.  Patient states that they tried to put packing up his nose however it was so painful that he made them take it out and he left.  Per chart review patient had anterior packing placed however continued to have small amount of bleeding past packing.  They had offered for posterior packing however patient became upset and demanded to be discharged.  He decided to come here for further evaluation as he continues to have small amount of bleeding.  He states he has never had issues with the same.  He is currently on Plavix.  He has no other complaints at this time.  The history is provided by the patient and medical records.      Home Medications Prior to Admission medications   Medication Sig Start Date End Date Taking? Authorizing Provider  amoxicillin-clavulanate (AUGMENTIN) 875-125 MG tablet Take 1 tablet by mouth every 12 (twelve) hours for 7 days. 05/17/21 05/24/21 Yes Kahlen Boyde, PA-C  albuterol (VENTOLIN HFA) 108 (90 Base) MCG/ACT inhaler Inhale 2 puffs into the lungs every 6 (six) hours as needed for wheezing or shortness of breath. Patient taking differently: Inhale 2 puffs into the lungs as needed for wheezing or shortness of breath. 07/18/19   Loman Brooklyn, FNP  allopurinol (ZYLOPRIM) 100 MG tablet Take 1 tablet (100 mg total) by mouth 3 (three) times daily. 04/19/21   Loman Brooklyn, FNP  amLODipine (NORVASC) 5 MG tablet Take 1 tablet (5 mg total) by mouth daily. 04/25/21   Arnoldo Lenis, MD  aspirin EC 81 MG tablet Take 81  mg by mouth at bedtime.     [provider]  carvedilol (COREG) 25 MG tablet TAKE ONE (1) TABLET BY MOUTH TWO (2) TIMES DAILY 04/19/21   Loman Brooklyn, FNP  cetirizine (ZYRTEC) 10 MG tablet Take 1 tablet (10 mg total) by mouth daily as needed (for allergies.). 01/20/20   Gwenlyn Perking, FNP  clopidogrel (PLAVIX) 75 MG tablet Take 1 tablet (75 mg total) by mouth at bedtime. 04/19/21   Loman Brooklyn, FNP  cyclobenzaprine (FLEXERIL) 10 MG tablet TAKE 1 TABLET AT BEDTIME AS NEEDED FOR MUSCLE SPASMS 06/23/20   Hendricks Limes F, FNP  Evolocumab (REPATHA SURECLICK) 867 MG/ML SOAJ Inject 140 mg into the skin every 14 (fourteen) days. STOP NEXLIZET 02/11/21   Hendricks Limes F, FNP  HYDROcodone-acetaminophen (NORCO) 10-325 MG tablet Take 1 tablet by mouth 4 (four) times daily.    [provider]  Insulin Pen Needle (GNP ULTICARE PEN NEEDLES) 32G X 6 MM MISC Use to give insulin daily Dx E11.22 02/03/21   Loman Brooklyn, FNP  isosorbide mononitrate (IMDUR) 60 MG 24 hr tablet TAKE ONE (1) TABLET BY MOUTH EVERY DAY 04/04/21   Loman Brooklyn, FNP  liraglutide (VICTOZA) 18 MG/3ML SOPN Inject 0.6 mg into the skin daily. 04/19/21   Loman Brooklyn, FNP  lisinopril (ZESTRIL) 40 MG tablet Take 1 tablet (40 mg total) by mouth at bedtime. 04/19/21  Loman Brooklyn, FNP  Multiple Vitamin (MULTIVITAMIN WITH MINERALS) TABS tablet Take 1 tablet by mouth at bedtime. Centrum Silver    [provider]  nitroGLYCERIN (NITROSTAT) 0.4 MG SL tablet Place 1 tablet (0.4 mg total) under the tongue every 5 (five) minutes as needed for chest pain. Patient taking differently: Place 0.4 mg under the tongue as directed. 11/22/16 05/27/21  Herminio Commons, MD  pregabalin (LYRICA) 75 MG capsule Take 1 capsule (75 mg total) by mouth 3 (three) times daily. New lower RENAL DOSING 04/23/20   Hendricks Limes F, FNP  torsemide (DEMADEX) 20 MG tablet Take 1 tablet (20 mg total) by mouth 2 (two) times daily. 04/19/21    Loman Brooklyn, FNP      Allergies    Atorvastatin, Statins, Sulfa antibiotics, and Lexapro [escitalopram]    Review of Systems   Review of Systems  Constitutional:  Negative for chills and fever.  HENT:  Positive for nosebleeds.   All other systems reviewed and are negative.  Physical Exam Updated Vital Signs BP 140/77 (BP Location: Right Arm)    Pulse 91    Temp 97.8 F (36.6 C) (Oral)    Resp 20    Ht 5\' 6"  (1.676 m)    Wt 72.8 kg    SpO2 94%    BMI 25.90 kg/m  Physical Exam Vitals and nursing note reviewed.  Constitutional:      Appearance: He is not ill-appearing.  HENT:     Head: Normocephalic and atraumatic.     Nose:     Comments: Guaze in place to left naris s/p afrin spray. Gauze removed. No active bleeding at this time however does have small blood clot to anterior aspect that is easily visualized.  Eyes:     Conjunctiva/sclera: Conjunctivae normal.  Cardiovascular:     Rate and Rhythm: Normal rate and regular rhythm.  Pulmonary:     Effort: Pulmonary effort is normal.     Breath sounds: Normal breath sounds.  Skin:    General: Skin is warm and dry.     Coloration: Skin is not jaundiced.  Neurological:     Mental Status: He is alert.    ED Results / Procedures / Treatments   Labs (all labs ordered are listed, but only abnormal results are displayed) Labs Reviewed - No data to display  EKG None  Radiology No results found.  Procedures .Epistaxis Management  Date/Time: 05/17/2021 8:00 PM Performed by: Eustaquio Maize, PA-C Authorized by: Eustaquio Maize, PA-C   Consent:    Consent obtained:  Verbal   Consent given by:  Patient   Risks discussed:  Infection, nasal injury, pain and bleeding Universal protocol:    Patient identity confirmed:  Verbally with patient Procedure details:    Treatment site:  L posterior   Treatment method:  Nasal tampon Post-procedure details:    Assessment:  Bleeding stopped   Procedure completion:  Tolerated  well, no immediate complications Comments:     Initially placed anterior/posterior nasal packing measuring 7.5 cm with recurrence of bleeding. This nasal packing was removed and posterior pack placed with cease in bleeding.     Medications Ordered in ED Medications  lidocaine HCl (PF) (XYLOCAINE) 2 % injection (  Not Given 05/17/21 1723)  oxymetazoline (AFRIN) 0.05 % nasal spray 1 spray (1 spray Each Nare Given 05/17/21 1552)  lidocaine-EPINEPHrine (XYLOCAINE W/EPI) 2 %-1:200000 (PF) injection 10 mL (10 mLs Infiltration Given 05/17/21 1736)  HYDROcodone-acetaminophen (NORCO/VICODIN) 5-325 MG per  tablet 1 tablet (1 tablet Oral Given 05/17/21 1944)    ED Course/ Medical Decision Making/ A&P                           Medical Decision Making 71 year old male who presents to the ED today for complaint of epistaxis to left naris.  Has been seen at [facility] ED earlier today and had anterior packing placed however became upset due to amount of pain and requested removal and discharge.  Patient ultimately left AGAINST MEDICAL ADVICE.  He continues to have small amount of bleeding prompting return ED visit at this time.  He is on Plavix currently.  On arrival to the ED vitals are stable.  Patient appears to be in no acute distress.  He was provided Afrin spray in the ED prior to being seen.  On my exam he has gauze placed to his left nare.  This has been removed.  No active bleeding appreciated at this time however patient does have a small blood clot visualized in the anterior aspect.  We will plan to attempt removal of blood clot as well as lidocaine soaked gauze with epinephrine.  If unable to control bleeding patient will likely need packing replaced at this time.  He is aware of plan.   Attempted epistaxis control with lidocaine soaked cotton swabs without relief of bleeding.  Large 7.5 cm anterior posterior Rhino Rocket placed however shortly after leaving the room patient began bleeding around nasal packing.   Attempted to increase inflation however no relief and continued to bleed.  This was ultimately taken out and large posterior nasal packing placed.  Patient did have a somewhat bleeding out of his right naris immediately after placement however this has ceased.  He was monitored and has a very small amount of light colored blood mixed with saline from nasal packing on wash cloth at bedside. Not actively bleeding. Do feel he is stable for discharge home. There is concern for medical compliance as I feel pt may pull packing out at home. Pain medication provided in the ED. Pt has Norco at home to take as needed. ENT referral given.   Problems Addressed: Left-sided epistaxis: acute illness or injury  Amount and/or Complexity of Data Reviewed Labs: ordered.  Risk OTC drugs. Prescription drug management.   After discharge attempt pt began bleeding profusely through posterior pack. Labs ordered at this time. Will plan for ENT consult.   Discussed case with Dr. Janace Hoard ENT who recommends ED to ED transfer to [facility] and to reconsult once pt arrives to come evaluate.   9:28 PM Discussed case with Dr. Doren Custard at [facility] who accepts patient.    Final Clinical Impression(s) / ED Diagnoses Final diagnoses:  Left-sided epistaxis    Rx / DC Orders ED Discharge Orders          Ordered    amoxicillin-clavulanate (AUGMENTIN) 875-125 MG tablet  Every 12 hours        05/17/21 2000             Discharge Instructions      Please follow up with Dr. Janace Hoard [facility]ro[facility] for further evaluation of your nose bleed/removal of the nasal packing  Pick up antibiotics and take as prescribed to cover for possibility of infection with the nasal packing/foreign body in place  Return to the ED for any new/worsening symptoms        Eustaquio Maize, PA-C 05/17/21 2001  Eustaquio Maize, PA-C 05/17/21 2128

## 2021-05-18 ENCOUNTER — Observation Stay (HOSPITAL_COMMUNITY): Payer: Medicare Other

## 2021-05-18 ENCOUNTER — Telehealth: Payer: Self-pay | Admitting: Cardiology

## 2021-05-18 DIAGNOSIS — R040 Epistaxis: Secondary | ICD-10-CM

## 2021-05-18 LAB — RESP PANEL BY RT-PCR (FLU A&B, COVID) ARPGX2
Influenza A by PCR: NEGATIVE
Influenza B by PCR: NEGATIVE
SARS Coronavirus 2 by RT PCR: NEGATIVE

## 2021-05-18 LAB — TYPE AND SCREEN
ABO/RH(D): B POS
Antibody Screen: NEGATIVE

## 2021-05-18 LAB — HEMOGLOBIN AND HEMATOCRIT, BLOOD
HCT: 37.1 % — ABNORMAL LOW (ref 39.0–52.0)
Hemoglobin: 12.2 g/dL — ABNORMAL LOW (ref 13.0–17.0)

## 2021-05-18 LAB — ABO/RH: ABO/RH(D): B POS

## 2021-05-18 MED ORDER — ALLOPURINOL 100 MG PO TABS: 100.0000 mg | ORAL_TABLET | Freq: Three times a day (TID) | ORAL | Status: DC

## 2021-05-18 MED ORDER — CEPHALEXIN 500 MG PO CAPS: 500.0000 mg | ORAL_CAPSULE | Freq: Two times a day (BID) | ORAL | 0 refills | Status: DC

## 2021-05-18 MED ORDER — ACETAMINOPHEN 325 MG PO TABS: 650.0000 mg | ORAL_TABLET | Freq: Four times a day (QID) | ORAL | Status: DC | PRN

## 2021-05-18 MED ORDER — ISOSORBIDE MONONITRATE ER 30 MG PO TB24: 60.0000 mg | ORAL_TABLET | Freq: Every day | ORAL | Status: DC

## 2021-05-18 MED ORDER — AMLODIPINE BESYLATE 5 MG PO TABS: 5.0000 mg | ORAL_TABLET | Freq: Every day | ORAL | Status: DC

## 2021-05-18 MED ORDER — CARVEDILOL 12.5 MG PO TABS: 25.0000 mg | ORAL_TABLET | Freq: Two times a day (BID) | ORAL | Status: DC

## 2021-05-18 MED ORDER — ACETAMINOPHEN 650 MG RE SUPP: 650.0000 mg | Freq: Four times a day (QID) | RECTAL | Status: DC | PRN

## 2021-05-18 MED ORDER — INSULIN ASPART 100 UNIT/ML IJ SOLN: 0.0000 [IU] | INTRAMUSCULAR | Status: DC

## 2021-05-18 MED ORDER — PREGABALIN 25 MG PO CAPS: 75.0000 mg | ORAL_CAPSULE | Freq: Three times a day (TID) | ORAL | Status: DC

## 2021-05-18 MED ORDER — LISINOPRIL 20 MG PO TABS: 40.0000 mg | ORAL_TABLET | Freq: Every day | ORAL | Status: DC

## 2021-05-18 NOTE — ED Provider Notes (Signed)
Patient transferred from [facility] for evaluation of persistent epistaxis.  On ED arrival patient with packing in place in the left nare.  There is a small amount of steady venous oozing from this area.  Discussed with Dr. Janace Hoard with ENT, will see the patient in consult.  Recommendation for medicine admission for observation for resolution of bleeding.  Medicine consulted for admission.   Patient has been evaluated by Dr. Janace Hoard in the emergency department.  Patient does not want to stay for in-house observation.  Discussed reasoning for observation.  Plan to sign out AMA.  Given information for follow-up with ENT for packing removal.  We will start prophylactic antibiotics.  Return precautions discussed.   Quintella Reichert, MD 05/18/21 [phone]

## 2021-05-18 NOTE — Assessment & Plan Note (Addendum)
Patient started having epistaxis/bleeding from left nostril yesterday morning.  He is on aspirin and Plavix, last dose yesterday at 11 AM.  Currently has posterior nasal packing in place and continues to have mild oozing of blood through the packing.  ENT consulted and aware, recommending admission for observation and reconsulting if profusely bleeding.  Recommending clear liquid diet for now in case patient needs to be taken to the OR.  Patient is hemodynamically stable.  Hemoglobin 13.5 >12.9 >12.2. -Hold aspirin and Plavix.  Monitor H&H.  Clear liquid diet.

## 2021-05-18 NOTE — Subjective & Objective (Addendum)
Patient is a 71 year old male with a past medical history of CAD status post PCI (last intervention in 2018) on aspirin and Plavix, chronic diastolic CHF, hypertension, hyperlipidemia, PAD status post stenting in 2021, gout, CKD stage IIIa, type 2 diabetes, tobacco use.  Patient was initially seen at [facility] system ED yesterday for epistaxis/bleeding from left nostril.  Anterior nasal packing was placed but there continued to be small amount of bleeding past the packing.  Posterior packing was offered but patient became upset, requested removal of nasal packing, and left AMA. Subsequently went to [facility] ED due to persistent epistaxis where they attempted epistaxis control with lidocaine soaked cotton swabs without relief of bleeding.  Large 7.5 cm anterior posterior Rhino rocket placed, however, shortly after patient began bleeding around the nasal packing again.  Attempt was made to increase inflation, however, no relief and bleeding persisted.  Rhino pocket was ultimately taken out and large posterior nasal packing placed after which bleeding was under control momentarily but then started bleeding profusely through posterior packing.  ED physician discussed the case with Dr. Janace Hoard from ENT who recommended ED to ED transfer to [facility] and reconsult once patient arrives [facility].  At [facility] ED, patient continued to have steady oozing of blood through posterior packing.  ED physician at [facility] also spoke to Dr. Janace Hoard who recommended admission for observation by medicine service and reconsulting ENT if bleeding is profuse.  Recommended clear liquid diet for now in case patient needs to be taken to the OR.  Patient remained hemodynamically.  Labs showing hemoglobin 12.9 >12.2, was 13.5 yesterday at [facility]  Patient states yesterday at 6 AM when he woke up from sleep, he noticed that his left nostril was bleeding profusely.  He denies any injury to his nose.  Denies scratching or picking his nose.   He takes aspirin and Plavix, last dose was yesterday at 11 AM.  Reports pain in his left nostril after packing was placed.  Denies fevers, cough, shortness of breath, or chest pain.  Endorsing bilateral lower extremity edema.

## 2021-05-18 NOTE — Assessment & Plan Note (Addendum)
Last intervention in 2018.  Not endorsing any anginal symptoms. -Hold aspirin and Plavix in the setting of epistaxis.

## 2021-05-18 NOTE — Assessment & Plan Note (Signed)
Continue allopurinol 

## 2021-05-18 NOTE — Assessment & Plan Note (Signed)
Status post stenting in 2021. -Hold aspirin and Plavix in the setting of epistaxis.

## 2021-05-18 NOTE — Assessment & Plan Note (Signed)
Slightly hypertensive. -Continue amlodipine, Coreg, Imdur, lisinopril

## 2021-05-18 NOTE — Assessment & Plan Note (Addendum)
A1c 4.8 on 04/19/2021. -Sensitive sliding scale insulin every 4 hours.

## 2021-05-18 NOTE — H&P (Signed)
History and Physical    Tommy Douglas NKN:[id number] DOB: 11-14-1950 DOA: 05/17/2021  PCP: Loman Brooklyn, FNP  Patient coming from:  [facility] ED  HPI: Patient is a 71 year old male with a past medical history of CAD status post PCI (last intervention in 2018) on aspirin and Plavix, chronic diastolic CHF, hypertension, hyperlipidemia, PAD status post stenting in 2021, gout, CKD stage IIIa, type 2 diabetes, tobacco use.  Patient was initially seen at [facility] system ED yesterday for epistaxis/bleeding from left nostril.  Anterior nasal packing was placed but there continued to be small amount of bleeding past the packing.  Posterior packing was offered but patient became upset, requested removal of nasal packing, and left AMA. Subsequently went to [facility] ED due to persistent epistaxis where they attempted epistaxis control with lidocaine soaked cotton swabs without relief of bleeding.  Large 7.5 cm anterior posterior Rhino rocket placed, however, shortly after patient began bleeding around the nasal packing again.  Attempt was made to increase inflation, however, no relief and bleeding persisted.  Rhino pocket was ultimately taken out and large posterior nasal packing placed after which bleeding was under control momentarily but then started bleeding profusely through posterior packing.  ED physician discussed the case with Dr. Janace Hoard from ENT who recommended ED to ED transfer to [facility] and reconsult once patient arrives [facility].  At [facility] ED, patient continued to have steady oozing of blood through posterior packing.  ED physician at [facility] also spoke to Dr. Janace Hoard who recommended admission for observation by medicine service and reconsulting ENT if bleeding is profuse.  Recommended clear liquid diet for now in case patient needs to be taken to the OR.  Patient remained hemodynamically.  Labs showing hemoglobin 12.9 >12.2, was 13.5 yesterday at [facility].  Patient states yesterday at  6 AM when he woke up from sleep, he noticed that his left nostril was bleeding profusely.  He denies any injury to his nose.  Denies scratching or picking his nose.  He takes aspirin and Plavix, last dose was yesterday at 11 AM.  Reports pain in his left nostril after packing was placed.  Denies fevers, cough, shortness of breath, or chest pain.  Endorsing bilateral lower extremity edema.   Review of Systems:  All systems reviewed and apart from history of presenting illness, are negative.  Past Medical History:  Diagnosis Date   Anxiety    Arthritis    "qwhere" (12/20/2016)   Chronic lower back pain    CKD (chronic kidney disease), stage IV (HCC)    Complication of anesthesia    "got the wrong kind of anesthesia ~ 2000 when they were looking down into my stomach"   Coronary artery disease    Diabetic nephropathy ([place])    Gout    Heart disease    Hyperlipidemia    Hypertension    Pneumonia ~ 2015   Seasonal allergies    Type II diabetes mellitus ([place])     Past Surgical History:  Procedure Laterality Date   ABDOMINAL AORTOGRAM W/LOWER EXTREMITY N/A 06/02/2019   Procedure: ABDOMINAL AORTOGRAM W/LOWER EXTREMITY;  Surgeon: Lorretta Harp, MD;  Location: [facility] CV LAB;  Service: Cardiovascular;  Laterality: N/A;   CARDIAC CATHETERIZATION  12/20/2016   CORONARY ANGIOPLASTY WITH STENT PLACEMENT  2004 X 2   "1st stent moved"   CORONARY STENT INTERVENTION N/A 12/21/2016   Procedure: CORONARY STENT INTERVENTION;  Surgeon: Burnell Blanks, MD;  Location: [facility]  INVASIVE CV LAB;  Service: Cardiovascular;  Laterality: N/A;   ESOPHAGOGASTRODUODENOSCOPY  ~ 2000   LAPAROSCOPIC CHOLECYSTECTOMY     LEFT HEART CATH AND CORONARY ANGIOGRAPHY N/A 12/20/2016   Procedure: LEFT HEART CATH AND CORONARY ANGIOGRAPHY;  Surgeon: Burnell Blanks, MD;  Location: [place] CV LAB;  Service: Cardiovascular;  Laterality: N/A;     reports that he has been smoking cigarettes. He has a 22.00  pack-year smoking history. He has never used smokeless tobacco. He reports that he does not drink alcohol and does not use drugs.  Allergies  Allergen Reactions   Atorvastatin     All statins make him cough   Statins Cough    Pt is currently taking pravastatin    Sulfa Antibiotics    Lexapro [Escitalopram] Palpitations    Family History  Problem Relation Age of Onset   Cancer Mother        male   Diabetes Mother    Stroke Father        x3   Stroke Brother        x2    Other Sister        bowel necrosis?    Other Daughter        gout   Other Son        gout   Alcohol abuse Brother    Other Sister        pneumonia   Other Sister        pneumonia   Cancer Daughter        male   Other Daughter        fluid on brain - disabled     Prior to Admission medications   Medication Sig Start Date End Date Taking? Authorizing Provider  albuterol (VENTOLIN HFA) 108 (90 Base) MCG/ACT inhaler Inhale 2 puffs into the lungs every 6 (six) hours as needed for wheezing or shortness of breath. 07/18/19  Yes Hendricks Limes F, FNP  allopurinol (ZYLOPRIM) 100 MG tablet Take 1 tablet (100 mg total) by mouth 3 (three) times daily. 04/19/21  Yes Hendricks Limes F, FNP  amLODipine (NORVASC) 5 MG tablet Take 1 tablet (5 mg total) by mouth daily. 04/25/21  Yes BranchAlphonse Guild, MD  amoxicillin-clavulanate (AUGMENTIN) 875-125 MG tablet Take 1 tablet by mouth every 12 (twelve) hours for 7 days. 05/17/21 05/24/21 Yes Venter, Margaux, PA-C  aspirin EC 81 MG tablet Take 81 mg by mouth daily.   Yes [provider]  carvedilol (COREG) 25 MG tablet TAKE ONE (1) TABLET BY MOUTH TWO (2) TIMES DAILY Patient taking differently: Take 25 mg by mouth 2 (two) times daily with a meal. 04/19/21  Yes Loman Brooklyn, FNP  cetirizine (ZYRTEC) 10 MG tablet Take 1 tablet (10 mg total) by mouth daily as needed (for allergies.). Patient taking differently: Take 10 mg by mouth daily as needed for allergies. 01/20/20   Yes Gwenlyn Perking, FNP  clopidogrel (PLAVIX) 75 MG tablet Take 1 tablet (75 mg total) by mouth at bedtime. Patient taking differently: Take 75 mg by mouth daily. 04/19/21  Yes Hendricks Limes F, FNP  cyclobenzaprine (FLEXERIL) 10 MG tablet TAKE 1 TABLET AT BEDTIME AS NEEDED FOR MUSCLE SPASMS Patient taking differently: Take 10 mg by mouth at bedtime as needed for muscle spasms. 06/23/20  Yes Hendricks Limes F, FNP  Evolocumab (REPATHA SURECLICK) 858 MG/ML SOAJ Inject 140 mg into the skin every 14 (fourteen) days. STOP NEXLIZET 02/11/21  Yes Loman Brooklyn,  FNP  HYDROcodone-acetaminophen (NORCO) 10-325 MG tablet Take 1 tablet by mouth 4 (four) times daily.   Yes [provider]  Insulin Pen Needle (GNP ULTICARE PEN NEEDLES) 32G X 6 MM MISC Use to give insulin daily Dx E11.22 02/03/21  Yes Hendricks Limes F, FNP  isosorbide mononitrate (IMDUR) 60 MG 24 hr tablet TAKE ONE (1) TABLET BY MOUTH EVERY DAY Patient taking differently: Take 60 mg by mouth daily. 04/04/21  Yes Hendricks Limes F, FNP  liraglutide (VICTOZA) 18 MG/3ML SOPN Inject 0.6 mg into the skin daily. 04/19/21  Yes Hendricks Limes F, FNP  lisinopril (ZESTRIL) 40 MG tablet Take 1 tablet (40 mg total) by mouth at bedtime. 04/19/21  Yes Hendricks Limes F, FNP  Multiple Vitamin (MULTIVITAMIN WITH MINERALS) TABS tablet Take 1 tablet by mouth daily. Centrum Silver   Yes [provider]  nitroGLYCERIN (NITROSTAT) 0.4 MG SL tablet Place 1 tablet (0.4 mg total) under the tongue every 5 (five) minutes as needed for chest pain. 11/22/16 05/27/21 Yes Herminio Commons, MD  pregabalin (LYRICA) 75 MG capsule Take 1 capsule (75 mg total) by mouth 3 (three) times daily. New lower RENAL DOSING 04/23/20  Yes Hendricks Limes F, FNP  torsemide (DEMADEX) 20 MG tablet Take 1 tablet (20 mg total) by mouth 2 (two) times daily. 04/19/21  Yes Hendricks Limes F, FNP    Physical Exam: Vitals:   05/17/21 1540 05/17/21 1541 05/17/21 2036 05/18/21 0006  BP:   140/77 (!) 181/100 (!) 163/87  Pulse:  91 97 99  Resp:  20 19 18   Temp:  97.8 F (36.6 C)    TempSrc:  Oral    SpO2:  94% 94% 98%  Weight: 72.8 kg     Height: 5\' 6"  (1.676 m)       Physical Exam Constitutional:      General: He is not in acute distress. HENT:     Head: Normocephalic and atraumatic.     Nose:     Comments: Left nostril: Mild oozing of blood through nasal packing. No bleeding from right nostril Eyes:     Extraocular Movements: Extraocular movements intact.     Conjunctiva/sclera: Conjunctivae normal.  Cardiovascular:     Rate and Rhythm: Normal rate and regular rhythm.     Pulses: Normal pulses.  Pulmonary:     Effort: Pulmonary effort is normal. No respiratory distress.     Breath sounds: Rales present. No wheezing.  Abdominal:     General: Bowel sounds are normal. There is no distension.     Palpations: Abdomen is soft.     Tenderness: There is no abdominal tenderness.  Musculoskeletal:     Cervical back: Normal range of motion and neck supple.     Right lower leg: Edema present.     Left lower leg: Edema present.     Comments: +3 pitting edema bilateral lower extremities  Skin:    General: Skin is warm and dry.  Neurological:     General: No focal deficit present.     Mental Status: He is alert and oriented to person, place, and time.     Labs on Admission: I have personally reviewed following labs and imaging studies  CBC: Recent Labs  Lab 05/17/21 2118 05/18/21 0035  WBC 9.4  --   NEUTROABS 7.0  --   HGB 12.9* 12.2*  HCT 42.6 37.1*  MCV 87.5  --   PLT 267  --    Basic Metabolic Panel: Recent Labs  Lab 05/17/21 2118  NA 138  K 3.9  CL 104  CO2 25  GLUCOSE 163*  BUN 23  CREATININE 1.36*  CALCIUM 9.2   GFR: Estimated Creatinine Clearance: 45.6 mL/min (A) (by C-G formula based on SCr of 1.36 mg/dL (H)). Liver Function Tests: No results for input(s): AST, ALT, ALKPHOS, BILITOT, PROT, ALBUMIN in the last 168 hours. No results  for input(s): LIPASE, AMYLASE in the last 168 hours. No results for input(s): AMMONIA in the last 168 hours. Coagulation Profile: Recent Labs  Lab 05/17/21 2118  INR 1.1   Cardiac Enzymes: No results for input(s): CKTOTAL, CKMB, CKMBINDEX, TROPONINI in the last 168 hours. BNP (last 3 results) No results for input(s): PROBNP in the last 8760 hours. HbA1C: No results for input(s): HGBA1C in the last 72 hours. CBG: No results for input(s): GLUCAP in the last 168 hours. Lipid Profile: No results for input(s): CHOL, HDL, LDLCALC, TRIG, CHOLHDL, LDLDIRECT in the last 72 hours. Thyroid Function Tests: No results for input(s): TSH, T4TOTAL, FREET4, T3FREE, THYROIDAB in the last 72 hours. Anemia Panel: No results for input(s): VITAMINB12, FOLATE, FERRITIN, TIBC, IRON, RETICCTPCT in the last 72 hours. Urine analysis: No results found for: COLORURINE, APPEARANCEUR, LABSPEC, PHURINE, GLUCOSEU, Between, BILIRUBINUR, KETONESUR, PROTEINUR, UROBILINOGEN, NITRITE, LEUKOCYTESUR  Radiological Exams on Admission: I have personally reviewed images No results found.  Assessment and Plan: * Epistaxis- (present on admission) Patient started having epistaxis/bleeding from left nostril yesterday morning.  He is on aspirin and Plavix, last dose yesterday at 11 AM.  Currently has posterior nasal packing in place and continues to have mild oozing of blood through the packing.  ENT consulted and aware, recommending admission for observation and reconsulting if profusely bleeding.  Recommending clear liquid diet for now in case patient needs to be taken to the OR.  Patient is hemodynamically stable.  Hemoglobin 13.5 >12.9 >12.2. -Hold aspirin and Plavix.  Monitor H&H.  Clear liquid diet.  Chronic heart failure with preserved ejection fraction (HCC)- (present on admission) Patient has +3 pitting edema of bilateral lower extremities and rales on auscultation of the lungs.  No hypoxia or signs of respiratory distress.   Echo done in July 2019 showing EF 60 to 53%, grade 1 diastolic dysfunction.  He takes torsemide 100 mg twice daily. -Check BNP and order chest x-ray   PAD (peripheral artery disease) ([place])- (present on admission) Status post stenting in 2021. -Hold aspirin and Plavix in the setting of epistaxis.  Chronic kidney disease, stage 3a (HCC) Creatinine 1.3, stable.  Chronic gout -Continue allopurinol  Type 2 diabetes mellitus (HCC) A1c 4.8 on 04/19/2021. -Sensitive sliding scale insulin every 4 hours.  CAD S/P percutaneous coronary angioplasty Last intervention in 2018.  Not endorsing any anginal symptoms. -Hold aspirin and Plavix in the setting of epistaxis.  Essential hypertension- (present on admission) Slightly hypertensive. -Continue amlodipine, Coreg, Imdur, lisinopril   DVT prophylaxis: SCDs Code Status: Full Code (discussed with the patient) Family Communication: Daughter at bedside. Consults called: ENT Level of care: Telemetry bed Admission status: It is my clinical opinion that referral for OBSERVATION is reasonable and necessary in this patient based on the above information provided. The aforementioned taken together are felt to place the patient at high risk for further clinical deterioration. However, it is anticipated that the patient may be medically stable for discharge from the hospital within 24 to 48 hours.   Shela Leff, MD [facility] 05/18/2021, 2:05 AM

## 2021-05-18 NOTE — Telephone Encounter (Signed)
Patient's daughter is calling stating the patient has had a constant nose bleed since 6 Am yesterday. Patient has been to [facility] then transferred to [facility]. Daughter states [facility] advised him if the bleeding does not stop from the balloon inserted he would need surgery due to bleeding out. Patient is no longer taking plavix and bleeding is still continuing. According to the daughter at the patient's last appt with Dr. Harl Bowie the patient was advised that he cannot have surgery. Daughter is requesting callback to discuss what to do is taking [facility] back [facility] now. Please advise.

## 2021-05-18 NOTE — ED Notes (Signed)
Patient leaving against medical advice at this time. MD Ralene Bathe aware of same. Patient given discharge instructions and verbalized to patient the risks of leaving against medical advice. This RN also explained to patient that he can always return to ED even though his is leaving AMA. Patient and patient's daughter at bedside both verbalized understanding. Patient ambulatory at time of discharge.

## 2021-05-18 NOTE — Consult Note (Signed)
Reason for Consult: Epistaxis Referring Physician: Emergency room  Tommy Douglas is an 71 y.o. male.  HPI: History of epistaxis for about 24 hours.  Patient is on Plavix.  He has had no significant epistaxis previously.  He has a history of coronary artery disease and other vascular issues for the Plavix.  He has had a pack performed in [place] that was removed.  He then was seen in [facility] and had 2  packs placed the current 1 is a double lumen balloon pack.  He has significantly decreased the bleeding to the point where he is only trickling occasionally.  He is not having any drip down the back of his throat.  He does have a slight amount of discomfort from the pack.  Past Medical History:  Diagnosis Date   Anxiety    Arthritis    "qwhere" (12/20/2016)   Chronic lower back pain    CKD (chronic kidney disease), stage IV (HCC)    Complication of anesthesia    "got the wrong kind of anesthesia ~ 2000 when they were looking down into my stomach"   Coronary artery disease    Diabetic nephropathy ([place])    Gout    Heart disease    Hyperlipidemia    Hypertension    Pneumonia ~ 2015   Seasonal allergies    Type II diabetes mellitus ([place])     Past Surgical History:  Procedure Laterality Date   ABDOMINAL AORTOGRAM W/LOWER EXTREMITY N/A 06/02/2019   Procedure: ABDOMINAL AORTOGRAM W/LOWER EXTREMITY;  Surgeon: Lorretta Harp, MD;  Location: [place] [facility];  Service: Cardiovascular;  Laterality: N/A;   CARDIAC CATHETERIZATION  12/20/2016   CORONARY ANGIOPLASTY WITH STENT PLACEMENT  2004 X 2   "1st stent moved"   CORONARY STENT INTERVENTION N/A 12/21/2016   Procedure: CORONARY STENT INTERVENTION;  Surgeon: Burnell Blanks, MD;  Location: [place] [facility];  Service: Cardiovascular;  Laterality: N/A;   ESOPHAGOGASTRODUODENOSCOPY  ~ 2000   LAPAROSCOPIC CHOLECYSTECTOMY     LEFT HEART CATH AND CORONARY ANGIOGRAPHY N/A 12/20/2016   Procedure: LEFT HEART CATH AND CORONARY  ANGIOGRAPHY;  Surgeon: Burnell Blanks, MD;  Location: [place] [facility];  Service: Cardiovascular;  Laterality: N/A;    Family History  Problem Relation Age of Onset   Cancer Mother        male   Diabetes Mother    Stroke Father        x3   Stroke Brother        x2    Other Sister        bowel necrosis?    Other Daughter        gout   Other Son        gout   Alcohol abuse Brother    Other Sister        pneumonia   Other Sister        pneumonia   Cancer Daughter        male   Other Daughter        fluid on brain - disabled     Social History:  reports that he has been smoking cigarettes. He has a 22.00 pack-year smoking history. He has never used smokeless tobacco. He reports that he does not drink alcohol and does not use drugs.  Allergies:  Allergies  Allergen Reactions   Atorvastatin     All statins make him cough   Statins Cough  Pt is currently taking pravastatin    Sulfa Antibiotics    Lexapro [Escitalopram] Palpitations    Medications: I have reviewed the patient's current medications.  Results for orders placed or performed during the hospital encounter of 05/17/21 (from the past 48 hour(s))  Basic metabolic panel     Status: Abnormal   Collection Time: 05/17/21  9:18 PM  Result Value Ref Range   Sodium 138 135 - 145 mmol/L   Potassium 3.9 3.5 - 5.1 mmol/L   Chloride 104 98 - 111 mmol/L   CO2 25 22 - 32 mmol/L   Glucose, Bld 163 (H) 70 - 99 mg/dL    Comment: Glucose reference range applies only to samples taken after fasting for at least 8 hours.   BUN 23 8 - 23 mg/dL   Creatinine, Ser 1.36 (H) 0.61 - 1.24 mg/dL   Calcium 9.2 8.9 - 10.3 mg/dL   GFR, Estimated 56 (L) >60 mL/min    Comment: (NOTE) Calculated using the CKD-EPI Creatinine Equation (2021)    Anion gap 9 5 - 15    Comment: Performed at [facility], [address] [place], [place] [address]  CBC with Differential     Status: Abnormal   Collection Time: 05/17/21  9:18 PM   Result Value Ref Range   WBC 9.4 4.0 - 10.5 K/uL   RBC 4.87 4.22 - 5.81 MIL/uL   Hemoglobin 12.9 (L) 13.0 - 17.0 g/dL   HCT 42.6 39.0 - 52.0 %   MCV 87.5 80.0 - 100.0 fL   MCH 26.5 26.0 - 34.0 pg   MCHC 30.3 30.0 - 36.0 g/dL   RDW 14.6 11.5 - 15.5 %   Platelets 267 150 - 400 K/uL   nRBC 0.0 0.0 - 0.2 %   Neutrophils Relative % 74 %   Neutro Abs 7.0 1.7 - 7.7 K/uL   Lymphocytes Relative 15 %   Lymphs Abs 1.4 0.7 - 4.0 K/uL   Monocytes Relative 8 %   Monocytes Absolute 0.7 0.1 - 1.0 K/uL   Eosinophils Relative 2 %   Eosinophils Absolute 0.2 0.0 - 0.5 K/uL   Basophils Relative 1 %   Basophils Absolute 0.1 0.0 - 0.1 K/uL   Immature Granulocytes 0 %   Abs Immature Granulocytes 0.02 0.00 - 0.07 K/uL    Comment: Performed at [facility], [address], [place], [place] [address]  Protime-INR     Status: None   Collection Time: 05/17/21  9:18 PM  Result Value Ref Range   Prothrombin Time 13.8 11.4 - 15.2 seconds   INR 1.1 0.8 - 1.2    Comment: (NOTE) INR goal varies based on device and disease states. Performed at [facility], [address], [place], [place] [address]   Resp Panel by RT-PCR (Flu A&B, Covid) Nasopharyngeal Swab     Status: None   Collection Time: 05/18/21 12:28 AM   Specimen: Nasopharyngeal Swab; Nasopharyngeal(NP) swabs in vial transport medium  Result Value Ref Range   SARS Coronavirus 2 by RT PCR NEGATIVE NEGATIVE    Comment: (NOTE) SARS-CoV-2 target nucleic acids are NOT DETECTED.  The SARS-CoV-2 RNA is generally detectable in upper respiratory specimens during the acute phase of infection. The lowest concentration of SARS-CoV-2 viral copies this assay can detect is 138 copies/mL. A negative result does not preclude SARS-Cov-2 infection and should not be used as the sole basis for treatment or other patient management decisions. A negative result may occur with  improper specimen collection/handling,  submission of specimen other than nasopharyngeal  swab, presence of viral mutation(s) within the areas targeted by this assay, and inadequate number of viral copies(<138 copies/mL). A negative result must be combined with clinical observations, patient history, and epidemiological information. The expected result is Negative.  Fact Sheet for Patients:  EntrepreneurPulse.com.au  Fact Sheet for Healthcare Providers:  IncredibleEmployment.be  This test is no t yet approved or cleared by the [place] FDA and  has been authorized for detection and/or diagnosis of SARS-CoV-2 by FDA under an Emergency Use Authorization (EUA). This EUA will remain  in effect (meaning this test can be used) for the duration of the COVID-19 declaration under Section 564(b)(1) of the Act, 21 U.S.C.section 360bbb-3(b)(1), unless the authorization is terminated  or revoked sooner.       Influenza A by PCR NEGATIVE NEGATIVE   Influenza B by PCR NEGATIVE NEGATIVE    Comment: (NOTE) The Xpert Xpress SARS-CoV-2/FLU/RSV plus assay is intended as an aid in the diagnosis of influenza from Nasopharyngeal swab specimens and should not be used as a sole basis for treatment. Nasal washings and aspirates are unacceptable for Xpert Xpress SARS-CoV-2/FLU/RSV testing.  Fact Sheet for Patients: EntrepreneurPulse.com.au  Fact Sheet for Healthcare Providers: IncredibleEmployment.be  This test is not yet approved or cleared by the [place] FDA and has been authorized for detection and/or diagnosis of SARS-CoV-2 by FDA under an Emergency Use Authorization (EUA). This EUA will remain in effect (meaning this test can be used) for the duration of the COVID-19 declaration under Section 564(b)(1) of the Act, 21 U.S.C. section 360bbb-3(b)(1), unless the authorization is terminated or revoked.  Performed at [facility], [place] [address] [place], [place] [address]   Type and screen [place]     Status: None   Collection Time: 05/18/21 12:34 AM  Result Value Ref Range   ABO/RH(D) B POS    Antibody Screen NEG    Sample Expiration      05/21/2021,2359 Performed at [facility], [employer] [address], [place], [place] [address]   Hemoglobin and hematocrit, blood     Status: Abnormal   Collection Time: 05/18/21 12:35 AM  Result Value Ref Range   Hemoglobin 12.2 (L) 13.0 - 17.0 g/dL   HCT 37.1 (L) 39.0 - 52.0 %    Comment: Performed at [facility], [place] [address], [place], [place] [address]  ABO/Rh     Status: None   Collection Time: 05/18/21 12:53 AM  Result Value Ref Range   ABO/RH(D)      B POS Performed at [place] [address] [place], [place] [address]     No results found.  ROS Blood pressure (!) 163/87, pulse 99, temperature 97.8 F (36.6 C), temperature source Oral, resp. rate 18, height 5\' 6"  (1.676 m), weight 72.8 kg, SpO2 98 %. Physical Exam HENT:     Head: Normocephalic.     Nose:     Comments: There is a double lumen pack in the left side.  There is no bleeding coming from around the pack actively.  There is no bleeding down the back of the throat from the nasopharynx.    Mouth/Throat:     Mouth: Mucous membranes are moist.  Eyes:     Pupils: Pupils are equal, round, and reactive to light.  Musculoskeletal:     Cervical back: Normal range of motion.  Neurological:     Mental Status: He is  alert.      Assessment/Plan: Epistaxis-the patient has been packed 3 times.  The patient states immediately that his cardiologist has stated that he cannot be put to sleep.  I encouraged the patient to agree to be admitted for observation and let this pack stay in over the night to see if it is going to stop his bleeding.  The next alternative is to go to the operating room for examination and possible cautery/packing or a IR intervention.  These options certainly have risk.  These would be preferred only if there is  continued bleeding.  Right now he is only oozing very occasionally which is acceptable.  The patient still seems to be not interested in this wait-and-see option.  He  possibly wants to go home.  He is admitted to the medicine service at the present time.  I will intervene if there is further active bleeding which most likely will proceed with IR option.  He otherwise will keep this packed for approximately 5 days.  He should be on Staph covering antibiotics such as Ancef or Keflex while the pack is in place.  Melissa Montane 05/18/2021, 2:13 AM

## 2021-05-18 NOTE — Assessment & Plan Note (Addendum)
Patient has +3 pitting edema of bilateral lower extremities and rales on auscultation of the lungs.  No hypoxia or signs of respiratory distress.  Echo done in July 2019 showing EF 60 to 15%, grade 1 diastolic dysfunction.  He takes torsemide 100 mg twice daily. -Check BNP and order chest x-ray

## 2021-05-18 NOTE — ED Notes (Signed)
Patient attempted to leave department stating that he no longer wanted to stay and was ready to go home. Patient also demanding to see ENT provider for a more detailed update on his plan of care. ENT provider called this RN and stated that he would be at bedside to speak with patient.

## 2021-05-18 NOTE — ED Notes (Signed)
ED Provider at bedside. 

## 2021-05-18 NOTE — Assessment & Plan Note (Signed)
Creatinine 1.3, stable.

## 2021-05-19 ENCOUNTER — Telehealth: Payer: Self-pay | Admitting: *Deleted

## 2021-05-19 NOTE — Telephone Encounter (Signed)
At our last appt we were evaluating him for hip surgery. We sent him for a stress test to better understand risk. Stress test overall did not show any severe findings and from my result note we had said would be ok to proceed with hip surgery. Recs would be the same if any ENT surgery was needed, being it would be ok    Zandra Abts MD

## 2021-05-19 NOTE — Telephone Encounter (Signed)
Laurine Blazer, LPN  9/84/2103  1:28 AM EST Back to Top    Notified, copy to pcp.  Patient states that he is currently hospitalized at [facility] in [place] due to epistaxis.     Laurine Blazer, LPN  04/14/8675 37:36 AM EST     Left message to return call.   Arnoldo Lenis, MD  05/08/2021  9:22 AM EST     Stress tests suggests small to moderate blockage. This is relatively mild finding and just something that would be managed with medications that he actually already is on and would not prohibit his surgery   Carlyle Dolly MD

## 2021-05-20 NOTE — Telephone Encounter (Signed)
Patient informed and verbalized understanding of plan. 

## 2021-05-24 ENCOUNTER — Ambulatory Visit (INDEPENDENT_AMBULATORY_CARE_PROVIDER_SITE_OTHER): Payer: Medicare Other

## 2021-05-24 DIAGNOSIS — R0989 Other specified symptoms and signs involving the circulatory and respiratory systems: Secondary | ICD-10-CM

## 2021-05-27 ENCOUNTER — Telehealth: Payer: Self-pay

## 2021-06-06 ENCOUNTER — Telehealth: Payer: Self-pay | Admitting: *Deleted

## 2021-06-06 NOTE — Telephone Encounter (Signed)
Laurine Blazer, LPN  ?04/12/3565 01:41 AM EDT Back to Top  ?  ?Notified daughter Tilda Burrow), copy to pcp.   ? ?

## 2021-06-06 NOTE — Telephone Encounter (Signed)
-----   Message from Arnoldo Lenis, MD sent at 06/05/2021 11:50 AM EDT ----- ?Carotid US shows mild blockages on both sides. Just something to continue to monitor at this time ? ?Zandra Abts MD ?

## 2021-06-08 ENCOUNTER — Telehealth: Payer: Self-pay

## 2021-06-08 NOTE — Telephone Encounter (Signed)
Transition Care Management Follow-up Telephone Call ?Date of discharge and from where: 06/07/21 - [facility] - Pneumonia ?How have you been since you were released from the hospital? Doing much better today ?Any questions or concerns? No ? ?Items Reviewed: ?Did the pt receive and understand the discharge instructions provided? Yes  ?Medications obtained and verified? Yes  ?Other? No  ?Any new allergies since your discharge? No  ?Dietary orders reviewed? Yes ?Do you have support at home? Yes  ? ?Home Care and Equipment/Supplies: ?Were home health services ordered? no ? ?Were any new equipment or medical supplies ordered?  No ? ?Functional Questionnaire: (I = Independent and D = Dependent) ?ADLs: I ? ?Bathing/Dressing- I ? ?Meal Prep- I ? ?Eating- I ? ?Maintaining continence- I ? ?Transferring/Ambulation- I ? ?Managing Meds- I ? ?Follow up appointments reviewed: ? ?PCP Hospital f/u appt confirmed? Yes  Scheduled to see Britney on 06/16/21 @ 11:05. ?[employer] [facility] f/u appt confirmed? No   ?Are transportation arrangements needed? No  ?If their condition worsens, is the pt aware to call PCP or go to the Emergency Dept.? Yes ?Was the patient provided with contact information for the PCP's office or ED? Yes ?Was to pt encouraged to call back with questions or concerns? Yes  ?

## 2021-06-16 ENCOUNTER — Ambulatory Visit: Payer: Medicare Other | Admitting: Family Medicine

## 2021-06-22 NOTE — Progress Notes (Signed)
? ? ?Office Visit  ?  ?Patient Name: Tommy Douglas ?Date of Encounter: 06/23/2021 ? ?Primary Care Provider:  Loman Brooklyn, FNP ?Primary Cardiologist:  Carlyle Dolly, MD ? ?Chief Complaint  ?  ?71 year old male with a history of CAD, chronic diastolic heart failure, carotid artery stenosis, PAD, hypertension, hyperlipidemia, CKD stage III, type 2 diabetes, gout, arthritis, and anxiety who presents for follow-up related to CAD and heart failure.  ? ?Past Medical History  ?  ?Past Medical History:  ?Diagnosis Date  ? Anxiety   ? Arthritis   ? "qwhere" (12/20/2016)  ? Chronic lower back pain   ? CKD (chronic kidney disease), stage IV ([place])   ? Complication of anesthesia   ? "got the wrong kind of anesthesia ~ 2000 when they were looking down into my stomach"  ? Coronary artery disease   ? Diabetic nephropathy ([place])   ? Gout   ? Heart disease   ? Hyperlipidemia   ? Hypertension   ? Pneumonia ~ 2015  ? Seasonal allergies   ? Type II diabetes mellitus ([place])   ? ?Past Surgical History:  ?Procedure Laterality Date  ? ABDOMINAL AORTOGRAM W/LOWER EXTREMITY N/A 06/02/2019  ? Procedure: ABDOMINAL AORTOGRAM W/LOWER EXTREMITY;  Surgeon: Lorretta Harp, MD;  Location: [facility];  Service: Cardiovascular;  Laterality: N/A;  ? CARDIAC CATHETERIZATION  12/20/2016  ? CORONARY ANGIOPLASTY WITH STENT PLACEMENT  2004 X 2  ? "1st stent moved"  ? CORONARY STENT INTERVENTION N/A 12/21/2016  ? Procedure: CORONARY STENT INTERVENTION;  Surgeon: Burnell Blanks, MD;  Location: [place];  Service: Cardiovascular;  Laterality: N/A;  ? ESOPHAGOGASTRODUODENOSCOPY  ~ 2000  ? LAPAROSCOPIC CHOLECYSTECTOMY    ? LEFT HEART CATH AND CORONARY ANGIOGRAPHY N/A 12/20/2016  ? Procedure: LEFT HEART CATH AND CORONARY ANGIOGRAPHY;  Surgeon: Burnell Blanks, MD;  Location: [place] [facility];  Service: Cardiovascular;  Laterality: N/A;  ? ? ?Allergies ? ?Allergies  ?Allergen Reactions  ? Atorvastatin   ?  All statins make  him cough  ? Statins Cough  ?  Pt is currently taking pravastatin   ? Sulfa Antibiotics   ? Lexapro [Escitalopram] Palpitations  ? ? ?History of Present Illness  ?  ?71 year old male with the above past medical history including CAD, chronic diastolic heart failure, PAD, hypertension, hyperlipidemia, CKD stage III, type 2 diabetes, gout, arthritis, and anxiety. ? ?He has a history of CAD with prior stenting to the LAD and RCA.  Most recent cath in 2018 s/p DES-mRCA, multivessel residual disease in the PDA and PL branch is too small for intervention, subtotal occlusion of LCx with collaterals. Echocardiogram in July 2019 showed EF 60 to 65%, mild concentric and moderate basal septal hypertrophy, grade 1 diastolic dysfunction. Additionally, he has a history of PAD with prior left external iliac stent, followed by Dr. Gwenlyn Found.  Lower extremity arterial duplex in October 2021 showed 30 to 49% mid and distal SFA, patent left proximal to mid external iliac stent.  ABIs in October 2021 showed no significant change from prior study. Repeat studies recommended in 12 months, however, patient has declined follow-up since. He was last seen in the office on 04/25/2021 for preoperative evaluation and was stable from a cardiac standpoint. Lexiscan Myoview in the setting of preoperative evaluation to assess exercise capacity in February 2023 was low risk. Carotid Dopplers in February 2023 in the setting of new carotid bruit showed 1 to 39% B ICA stenosis. He presented  to the ED on 05/18/2021 with persistent epistaxis. ENT was consulted, however, patient left AMA and was vies to follow-up with ENT as an outpatient. He was hospitalized in March 2023 in [place] at [facility] for pneumonia.  Records not available. He was discharged home on 06/07/2021 in stable condition.  ? ?He presents today for follow-up. Since his hospitalization he has been stable overall from a cardiac standpoint, though he does have ongoing bilateral 1+  pitting lower extremity edema. He also reports a 12 pound weight gain (weight is up 13 pounds since February 2023 by our scale). He denies dyspnea, PND, orthopnea. He denies symptoms concerning for angina. He states he was told during his hospitalization he was told that he has "congestive heart failure." He states echocardiogram was performed. Unfortunately, these records are not available for review at today's visit. He has been taking both torsemide and Lasix since his hospital discharge. He is concerned that his swelling has not improved.  ? ?Home Medications  ?  ?Current Outpatient Medications  ?Medication Sig Dispense Refill  ? albuterol (VENTOLIN HFA) 108 (90 Base) MCG/ACT inhaler Inhale 2 puffs into the lungs every 6 (six) hours as needed for wheezing or shortness of breath. 18 g 5  ? allopurinol (ZYLOPRIM) 100 MG tablet Take 1 tablet (100 mg total) by mouth 3 (three) times daily. 270 tablet 1  ? amLODipine (NORVASC) 5 MG tablet Take 1 tablet (5 mg total) by mouth daily. 90 tablet 1  ? aspirin EC 81 MG tablet Take 81 mg by mouth daily.    ? carvedilol (COREG) 25 MG tablet TAKE ONE (1) TABLET BY MOUTH TWO (2) TIMES DAILY (Patient taking differently: Take 25 mg by mouth 2 (two) times daily with a meal.) 180 tablet 1  ? cetirizine (ZYRTEC) 10 MG tablet Take 1 tablet (10 mg total) by mouth daily as needed (for allergies.). (Patient taking differently: Take 10 mg by mouth daily as needed for allergies.) 90 tablet 3  ? clopidogrel (PLAVIX) 75 MG tablet Take 1 tablet (75 mg total) by mouth at bedtime. (Patient taking differently: Take 75 mg by mouth daily.) 90 tablet 1  ? cyclobenzaprine (FLEXERIL) 10 MG tablet TAKE 1 TABLET AT BEDTIME AS NEEDED FOR MUSCLE SPASMS (Patient taking differently: Take 10 mg by mouth at bedtime as needed for muscle spasms.) 90 tablet 1  ? Evolocumab (REPATHA SURECLICK) 734 MG/ML SOAJ Inject 140 mg into the skin every 14 (fourteen) days. STOP NEXLIZET 2 mL 6  ? furosemide (LASIX) 40 MG  tablet Take 40 mg bid x 3 days, then take 40 mg daily 60 tablet 1  ? HYDROcodone-acetaminophen (NORCO) 10-325 MG tablet Take 1 tablet by mouth 4 (four) times daily.    ? Insulin Pen Needle (GNP ULTICARE PEN NEEDLES) 32G X 6 MM MISC Use to give insulin daily Dx E11.22 100 each 0  ? isosorbide mononitrate (IMDUR) 60 MG 24 hr tablet TAKE ONE (1) TABLET BY MOUTH EVERY DAY (Patient taking differently: Take 60 mg by mouth daily.) 30 tablet 0  ? liraglutide (VICTOZA) 18 MG/3ML SOPN Inject 0.6 mg into the skin daily. 9 mL 0  ? lisinopril (ZESTRIL) 40 MG tablet Take 1 tablet (40 mg total) by mouth at bedtime. 30 tablet 2  ? Multiple Vitamin (MULTIVITAMIN WITH MINERALS) TABS tablet Take 1 tablet by mouth daily. Centrum Silver    ? potassium chloride SA (KLOR-CON M) 20 MEQ tablet Take 1 tablet (20 mEq total) by mouth daily. 30 tablet 3  ?  pregabalin (LYRICA) 75 MG capsule Take 1 capsule (75 mg total) by mouth 3 (three) times daily. New lower RENAL DOSING 90 capsule 5  ? nitroGLYCERIN (NITROSTAT) 0.4 MG SL tablet Place 1 tablet (0.4 mg total) under the tongue every 5 (five) minutes as needed for chest pain. 25 tablet 3  ? ?No current facility-administered medications for this visit.  ?  ? ?Review of Systems  ?  ?He denies chest pain, palpitations, dyspnea, pnd, orthopnea, n, v, dizziness, syncope, or early satiety. All other systems reviewed and are otherwise negative except as noted above.  ? ?Physical Exam  ?  ?VS:  BP 140/60   Pulse 85   Ht 5\' 6"  (1.676 m)   Wt 173 lb (78.5 kg)   SpO2 97%   BMI 27.92 kg/m?  , ?GEN: Well nourished, well developed, in no acute distress. ?HEENT: normal. ?Neck: Supple, no JVD, carotid bruits, or masses. ?Cardiac: RRR, no murmurs, rubs, or gallops. No clubbing, cyanosis, 1+ pitting bilateral lower extremity edema.  Radials/DP/PT 2+ and equal bilaterally.  ?Respiratory:  Respirations regular and unlabored, clear to auscultation bilaterally. ?GI: Soft, nontender, nondistended, BS + x 4. ?MS:  no deformity or atrophy. ?Skin: warm and dry, no rash. ?Neuro:  Strength and sensation are intact. ?Psych: Normal affect. ? ?Accessory Clinical Findings  ?  ?ECG personally reviewed by me today -no EKG in office

## 2021-06-23 ENCOUNTER — Encounter: Payer: Self-pay | Admitting: Nurse Practitioner

## 2021-06-23 ENCOUNTER — Ambulatory Visit (INDEPENDENT_AMBULATORY_CARE_PROVIDER_SITE_OTHER): Payer: Medicare Other | Admitting: Nurse Practitioner

## 2021-06-23 VITALS — BP 140/60 | HR 85 | Ht 66.0 in | Wt 173.0 lb

## 2021-06-23 DIAGNOSIS — I10 Essential (primary) hypertension: Secondary | ICD-10-CM | POA: Diagnosis not present

## 2021-06-23 DIAGNOSIS — E785 Hyperlipidemia, unspecified: Secondary | ICD-10-CM

## 2021-06-23 DIAGNOSIS — E118 Type 2 diabetes mellitus with unspecified complications: Secondary | ICD-10-CM

## 2021-06-23 DIAGNOSIS — I5033 Acute on chronic diastolic (congestive) heart failure: Secondary | ICD-10-CM | POA: Diagnosis not present

## 2021-06-23 DIAGNOSIS — J189 Pneumonia, unspecified organism: Secondary | ICD-10-CM

## 2021-06-23 DIAGNOSIS — I739 Peripheral vascular disease, unspecified: Secondary | ICD-10-CM | POA: Diagnosis not present

## 2021-06-23 DIAGNOSIS — N183 Chronic kidney disease, stage 3 unspecified: Secondary | ICD-10-CM

## 2021-06-23 DIAGNOSIS — I2510 Atherosclerotic heart disease of native coronary artery without angina pectoris: Secondary | ICD-10-CM | POA: Diagnosis not present

## 2021-06-23 DIAGNOSIS — Z9861 Coronary angioplasty status: Secondary | ICD-10-CM

## 2021-06-23 DIAGNOSIS — Z87898 Personal history of other specified conditions: Secondary | ICD-10-CM

## 2021-06-23 DIAGNOSIS — Z794 Long term (current) use of insulin: Secondary | ICD-10-CM

## 2021-06-23 LAB — BASIC METABOLIC PANEL
BUN/Creatinine Ratio: 12 (ref 10–24)
BUN: 19 mg/dL (ref 8–27)
CO2: 25 mmol/L (ref 20–29)
Calcium: 9.3 mg/dL (ref 8.6–10.2)
Chloride: 104 mmol/L (ref 96–106)
Creatinine, Ser: 1.6 mg/dL — ABNORMAL HIGH (ref 0.76–1.27)
Glucose: 109 mg/dL — ABNORMAL HIGH (ref 70–99)
Potassium: 4.4 mmol/L (ref 3.5–5.2)
Sodium: 142 mmol/L (ref 134–144)
eGFR: 46 mL/min/{1.73_m2} — ABNORMAL LOW (ref >59)

## 2021-06-23 MED ORDER — POTASSIUM CHLORIDE CRYS ER 20 MEQ PO TBCR: 20.0000 meq | EXTENDED_RELEASE_TABLET | Freq: Every day | ORAL | 3 refills | Status: DC

## 2021-06-23 MED ORDER — FUROSEMIDE 40 MG PO TABS: ORAL_TABLET | ORAL | 1 refills | Status: DC

## 2021-06-23 NOTE — Patient Instructions (Addendum)
Medication Instructions:  ?Stop Torsemide 20 mg daily  ?Start Potassium 20 meq daily ?Start Lasix 40 mg twice daily for 3 days and then take 40 mg once daily  ? ?*If you need a refill on your cardiac medications before your next appointment, please call your pharmacy* ? ? ?Lab Work: ?Your physician recommends that you complete labs today return for lab work in 1 week.  ?BMET ? ?If you have labs (blood work) drawn today and your tests are completely normal, you will receive your results only by: ?MyChart Message (if you have MyChart) OR ?A paper copy in the mail ?If you have any lab test that is abnormal or we need to change your treatment, we will call you to review the results. ? ? ?Testing/Procedures: ?NONE ordered at this time of appointment  ? ? ? ?Follow-Up: ?At [facility], you and your health needs are our priority.  As part of our continuing mission to provide you with exceptional heart care, we have created designated Provider Care Teams.  These Care Teams include your primary Cardiologist (physician) and Advanced Practice Providers (APPs -  Physician Assistants and Nurse Practitioners) who all work together to provide you with the care you need, when you need it. ? ?We recommend signing up for the patient portal called "MyChart".  Sign up information is provided on this After Visit Summary.  MyChart is used to connect with patients for Virtual Visits (Telemedicine).  Patients are able to view lab/test results, encounter notes, upcoming appointments, etc.  Non-urgent messages can be sent to your provider as well.   ?To learn more about what you can do with MyChart, go to [email or web address].   ? ?Your next appointment:   ?1-2 week(s) ? ?The format for your next appointment:   ?In Person ? ?Provider:   ?Diona Browner, NP      ? ? ?Other Instructions ?Heart Failure Education: ? ?Weigh yourself EVERY morning after you go to the bathroom but before you eat or drink anything. Write this number down in a  weight log/diary. If you gain 3 pounds overnight or 5 pounds in a week, call the office. ?Take your medicines as prescribed. If you have concerns about your medications, please call us before you stop taking them. ?Eat low salt foods--Limit salt (sodium) to 2000 mg per day. This will help prevent your body from holding onto fluid. Read food labels as many processed foods have a lot of sodium, especially canned goods and prepackaged meats. If you would like some assistance choosing low sodium foods, we would be happy to set you up with a nutritionist. ?Limit all fluids for the day to less than 2 liters (64 ounces). Fluid includes all drinks, coffee, juice, ice chips, soup, jello, and all other liquids. ?Stay as active as you can everyday. Staying active will give you more energy and make your muscles stronger. Start with 5 minutes at a time and work your way up to 30 minutes a day. Break up your activities--do some in the morning and some in the afternoon. Start with 3 days per week and work your way up to 5 days as you can.  If you have chest pain, feel short of breath, dizzy, or lightheaded, STOP. If you don't feel better after a short rest, call 911. If you do feel better, call the office to let us know you have symptoms with exercise. ? ?  ? ?

## 2021-06-24 ENCOUNTER — Telehealth: Payer: Self-pay

## 2021-06-24 NOTE — Telephone Encounter (Signed)
Spoke with pt. Pt was notified of results and will follow up next week. Pt has lab orders and will repeat labs in 1 week.  ?

## 2021-06-28 ENCOUNTER — Ambulatory Visit (INDEPENDENT_AMBULATORY_CARE_PROVIDER_SITE_OTHER): Payer: Medicare Other | Admitting: Family Medicine

## 2021-06-28 ENCOUNTER — Other Ambulatory Visit: Payer: Self-pay | Admitting: Family Medicine

## 2021-06-28 ENCOUNTER — Encounter: Payer: Self-pay | Admitting: Family Medicine

## 2021-06-28 ENCOUNTER — Other Ambulatory Visit: Payer: Self-pay | Admitting: Cardiology

## 2021-06-28 VITALS — BP 127/65 | HR 86 | Temp 98.2°F | Ht 66.0 in | Wt 168.6 lb

## 2021-06-28 DIAGNOSIS — Z09 Encounter for follow-up examination after completed treatment for conditions other than malignant neoplasm: Secondary | ICD-10-CM

## 2021-06-28 DIAGNOSIS — I5032 Chronic diastolic (congestive) heart failure: Secondary | ICD-10-CM

## 2021-06-28 DIAGNOSIS — Z8701 Personal history of pneumonia (recurrent): Secondary | ICD-10-CM

## 2021-06-28 DIAGNOSIS — I2510 Atherosclerotic heart disease of native coronary artery without angina pectoris: Secondary | ICD-10-CM

## 2021-06-28 NOTE — Progress Notes (Signed)
? ?Assessment & Plan:  ?1. Chronic heart failure with preserved ejection fraction ([place]) ?Encouraged to keep appointment tomorrow with cardiology.  ? ?2. History of community acquired pneumonia ?Suggested repeat chest x-ray; patient declined. ? ?3. Hospital discharge follow-up ? ? ?Return as scheduled. ? ?Hendricks Limes, MSN, APRN, FNP-C ?[place] ? ?Subjective:  ? ? Patient ID: Tommy Douglas, male    DOB: 1950/08/09, 71 y.o.   MRN: [id number] ? ?Patient Care Team: ?Loman Brooklyn, FNP as PCP - General (Family Medicine) ?Arnoldo Lenis, MD as PCP - Cardiology (Cardiology) ?Lorretta Harp, MD as Consulting Physician (Cardiology) ?Rourk, Cristopher Estimable, MD as Consulting Physician (Gastroenterology) ?Lavera Guise, [facility] (Pharmacist) ?Jasper Loser, MD as Referring Physician (Orthopedic Surgery) ?[facility]  ? ?Chief Complaint:  ?Chief Complaint  ?Patient presents with  ? Hospitalization Follow-up  ?  [place] [facility] - PNA   ? Leg Swelling  ?  Patient states he has been having bilateral leg swelling since he was in the hospital   ? ? ?HPI: ?Tommy Douglas is a 71 y.o. male presenting on 06/28/2021 for Hospitalization Follow-up [facility] hospital - PNA ) and Leg Swelling (Patient states he has been having bilateral leg swelling since he was in the hospital ) ? ?Patient is here for a hospital follow-up. He was admitted to [facility] in [place], [place]; these records are not available for my review. He states he went due to chest pain and was diagnosed with congestive heart failure and pneumonia. Reports he was there for three days. He saw cardiology on 06/23/2021 and has another appointment tomorrow. He is weighing himself daily. States he has lost 20 lbs on his scale at home since he was discharged. He believes this is all the fluid that has come off him. He reports improvement in lower extremity edema and resolution of chest pain.  ? ?New complaints: ?None ? ? ?Social  history: ? ?Relevant past medical, surgical, family and social history reviewed and updated as indicated. Interim medical history since our last visit reviewed. ? ?Allergies and medications reviewed and updated. ? ?DATA REVIEWED: CHART IN EPIC ? ?ROS: Negative unless specifically indicated above in HPI.  ? ? ?Current Outpatient Medications:  ?  albuterol (VENTOLIN HFA) 108 (90 Base) MCG/ACT inhaler, Inhale 2 puffs into the lungs every 6 (six) hours as needed for wheezing or shortness of breath., Disp: 18 g, Rfl: 5 ?  allopurinol (ZYLOPRIM) 100 MG tablet, Take 1 tablet (100 mg total) by mouth 3 (three) times daily., Disp: 270 tablet, Rfl: 1 ?  amLODipine (NORVASC) 5 MG tablet, Take 1 tablet (5 mg total) by mouth daily., Disp: 90 tablet, Rfl: 1 ?  aspirin EC 81 MG tablet, Take 81 mg by mouth daily., Disp: , Rfl:  ?  carvedilol (COREG) 25 MG tablet, TAKE ONE (1) TABLET BY MOUTH TWO (2) TIMES DAILY (Patient taking differently: Take 25 mg by mouth 2 (two) times daily with a meal.), Disp: 180 tablet, Rfl: 1 ?  cetirizine (ZYRTEC) 10 MG tablet, Take 1 tablet (10 mg total) by mouth daily as needed (for allergies.). (Patient taking differently: Take 10 mg by mouth daily as needed for allergies.), Disp: 90 tablet, Rfl: 3 ?  clopidogrel (PLAVIX) 75 MG tablet, Take 1 tablet (75 mg total) by mouth at bedtime. (Patient taking differently: Take 75 mg by mouth daily.), Disp: 90 tablet, Rfl: 1 ?  cyclobenzaprine (FLEXERIL) 10 MG tablet, TAKE 1 TABLET AT BEDTIME AS NEEDED FOR MUSCLE  SPASMS (Patient taking differently: Take 10 mg by mouth at bedtime as needed for muscle spasms.), Disp: 90 tablet, Rfl: 1 ?  Evolocumab (REPATHA SURECLICK) 010 MG/ML SOAJ, Inject 140 mg into the skin every 14 (fourteen) days. STOP NEXLIZET, Disp: 2 mL, Rfl: 6 ?  furosemide (LASIX) 40 MG tablet, Take 40 mg bid x 3 days, then take 40 mg daily, Disp: 60 tablet, Rfl: 1 ?  HYDROcodone-acetaminophen (NORCO) 10-325 MG tablet, Take 1 tablet by mouth 4 (four)  times daily., Disp: , Rfl:  ?  Insulin Pen Needle (GNP ULTICARE PEN NEEDLES) 32G X 6 MM MISC, Use to give insulin daily Dx E11.22, Disp: 100 each, Rfl: 0 ?  isosorbide mononitrate (IMDUR) 60 MG 24 hr tablet, TAKE ONE (1) TABLET BY MOUTH EVERY DAY, Disp: 90 tablet, Rfl: 0 ?  liraglutide (VICTOZA) 18 MG/3ML SOPN, Inject 0.6 mg into the skin daily., Disp: 9 mL, Rfl: 0 ?  lisinopril (ZESTRIL) 40 MG tablet, Take 1 tablet (40 mg total) by mouth at bedtime., Disp: 30 tablet, Rfl: 2 ?  Multiple Vitamin (MULTIVITAMIN WITH MINERALS) TABS tablet, Take 1 tablet by mouth daily. Centrum Silver, Disp: , Rfl:  ?  potassium chloride SA (KLOR-CON M) 20 MEQ tablet, Take 1 tablet (20 mEq total) by mouth daily., Disp: 30 tablet, Rfl: 3 ?  pregabalin (LYRICA) 75 MG capsule, Take 1 capsule (75 mg total) by mouth 3 (three) times daily. New lower RENAL DOSING, Disp: 90 capsule, Rfl: 5 ?  nitroGLYCERIN (NITROSTAT) 0.4 MG SL tablet, Place 1 tablet (0.4 mg total) under the tongue every 5 (five) minutes as needed for chest pain., Disp: 25 tablet, Rfl: 3  ? ?Allergies  ?Allergen Reactions  ? Atorvastatin   ?  All statins make him cough  ? Statins Cough  ?  Pt is currently taking pravastatin   ? Sulfa Antibiotics   ? Lexapro [Escitalopram] Palpitations  ? ?Past Medical History:  ?Diagnosis Date  ? Anxiety   ? Arthritis   ? "qwhere" (12/20/2016)  ? Chronic lower back pain   ? CKD (chronic kidney disease), stage IV ([place])   ? Complication of anesthesia   ? "got the wrong kind of anesthesia ~ 2000 when they were looking down into my stomach"  ? Coronary artery disease   ? Diabetic nephropathy ([place])   ? Gout   ? Heart disease   ? Hyperlipidemia   ? Hypertension   ? Pneumonia ~ 2015  ? Seasonal allergies   ? Type II diabetes mellitus ([place])   ?  ?Past Surgical History:  ?Procedure Laterality Date  ? ABDOMINAL AORTOGRAM W/LOWER EXTREMITY N/A 06/02/2019  ? Procedure: ABDOMINAL AORTOGRAM W/LOWER EXTREMITY;  Surgeon: Lorretta Harp, MD;  Location: [place] [facility];  Service: Cardiovascular;  Laterality: N/A;  ? CARDIAC CATHETERIZATION  12/20/2016  ? CORONARY ANGIOPLASTY WITH STENT PLACEMENT  2004 X 2  ? "1st stent moved"  ? CORONARY STENT INTERVENTION N/A 12/21/2016  ? Procedure: CORONARY STENT INTERVENTION;  Surgeon: Burnell Blanks, MD;  Location: [place] [facility];  Service: Cardiovascular;  Laterality: N/A;  ? ESOPHAGOGASTRODUODENOSCOPY  ~ 2000  ? LAPAROSCOPIC CHOLECYSTECTOMY    ? LEFT HEART CATH AND CORONARY ANGIOGRAPHY N/A 12/20/2016  ? Procedure: LEFT HEART CATH AND CORONARY ANGIOGRAPHY;  Surgeon: Burnell Blanks, MD;  Location: [place] [facility];  Service: Cardiovascular;  Laterality: N/A;  ?  ?Social History  ? ?Socioeconomic History  ? Marital status: Widowed  ?  Spouse name: Not  on file  ? Number of children: 7  ? Years of education: Not on file  ? Highest education level: High school graduate  ?Occupational History  ? Occupation: retired   ?  Comment: [employer]  ?Tobacco Use  ? Smoking status: Every Day  ?  Packs/day: 0.50  ?  Years: 44.00  ?  Pack years: 22.00  ?  Types: Cigarettes  ? Smokeless tobacco: Never  ?Vaping Use  ? Vaping Use: Never used  ?Substance and Sexual Activity  ? Alcohol use: No  ? Drug use: No  ? Sexual activity: Not Currently  ?Other Topics Concern  ? Not on file  ?Social History Narrative  ? Not on file  ? ?Social Determinants of Health  ? ?Financial Resource Strain: Not on file  ?Food Insecurity: Not on file  ?Transportation Needs: Not on file  ?Physical Activity: Not on file  ?Stress: Not on file  ?Social Connections: Not on file  ?Intimate Partner Violence: Not on file  ?  ? ?   ?Objective:  ?  ?BP 127/65   Pulse 86   Temp 98.2 ?F (36.8 ?C) (Temporal)   Ht 5\' 6"  (1.676 m)   Wt 168 lb 9.6 oz (76.5 kg)   SpO2 97%   BMI 27.21 kg/m?  ? ?Wt Readings from Last 3 Encounters:  ?06/28/21 168 lb 9.6 oz (76.5 kg)  ?06/23/21 173 lb (78.5 kg)  ?05/17/21 160 lb 7.9 oz (72.8 kg)   ? ? ?Physical Exam ?Vitals reviewed.  ?Constitutional:   ?   General: He is not in acute distress. ?   Appearance: Normal appearance. He is not ill-appearing, toxic-appearing or diaphoretic.  ?HENT:  ?   Head: Normocephalic

## 2021-06-29 ENCOUNTER — Encounter: Payer: Self-pay | Admitting: Physician Assistant

## 2021-06-29 ENCOUNTER — Ambulatory Visit: Payer: Medicare Other | Admitting: Physician Assistant

## 2021-06-29 VITALS — BP 132/64 | HR 98 | Resp 20 | Ht 66.0 in | Wt 166.0 lb

## 2021-06-29 DIAGNOSIS — I5033 Acute on chronic diastolic (congestive) heart failure: Secondary | ICD-10-CM | POA: Diagnosis not present

## 2021-06-29 DIAGNOSIS — I2510 Atherosclerotic heart disease of native coronary artery without angina pectoris: Secondary | ICD-10-CM | POA: Diagnosis not present

## 2021-06-29 DIAGNOSIS — Z79899 Other long term (current) drug therapy: Secondary | ICD-10-CM

## 2021-06-29 DIAGNOSIS — I10 Essential (primary) hypertension: Secondary | ICD-10-CM

## 2021-06-29 DIAGNOSIS — R7989 Other specified abnormal findings of blood chemistry: Secondary | ICD-10-CM

## 2021-06-29 DIAGNOSIS — N183 Chronic kidney disease, stage 3 unspecified: Secondary | ICD-10-CM

## 2021-06-29 DIAGNOSIS — E785 Hyperlipidemia, unspecified: Secondary | ICD-10-CM

## 2021-06-29 DIAGNOSIS — E119 Type 2 diabetes mellitus without complications: Secondary | ICD-10-CM

## 2021-06-29 LAB — BASIC METABOLIC PANEL
BUN/Creatinine Ratio: 16 (ref 10–24)
BUN: 26 mg/dL (ref 8–27)
CO2: 28 mmol/L (ref 20–29)
Calcium: 9.4 mg/dL (ref 8.6–10.2)
Chloride: 103 mmol/L (ref 96–106)
Creatinine, Ser: 1.63 mg/dL — ABNORMAL HIGH (ref 0.76–1.27)
Glucose: 104 mg/dL — ABNORMAL HIGH (ref 70–99)
Potassium: 4.6 mmol/L (ref 3.5–5.2)
Sodium: 141 mmol/L (ref 134–144)
eGFR: 45 mL/min/{1.73_m2} — ABNORMAL LOW (ref >59)

## 2021-06-29 NOTE — Patient Instructions (Signed)
?  Medication Instructions:  ?Your physician recommends that you continue on your current medications as directed. Please refer to the Current Medication list given to you today.  ? ?Add a 2nd dose of Lasix and Potassium if you have a 3 lb weight gain over night.  ? ?*If you need a refill on your cardiac medications before your next appointment, please call your pharmacy* ? ? ?Lab Work: ?Your physician recommends that you complete BMET today. ? ?If you have labs (blood work) drawn today and your tests are completely normal, you will receive your results only by: ?MyChart Message (if you have MyChart) OR ?A paper copy in the mail ?If you have any lab test that is abnormal or we need to change your treatment, we will call you to review the results. ? ? ?Testing/Procedures: ?NONE ordered at this time of appointment  ? ? ? ?Follow-Up: ?At [facility], you and your health needs are our priority.  As part of our continuing mission to provide you with exceptional heart care, we have created designated Provider Care Teams.  These Care Teams include your primary Cardiologist (physician) and Advanced Practice Providers (APPs -  Physician Assistants and Nurse Practitioners) who all work together to provide you with the care you need, when you need it. ? ?We recommend signing up for the patient portal called "MyChart".  Sign up information is provided on this After Visit Summary.  MyChart is used to connect with patients for Virtual Visits (Telemedicine).  Patients are able to view lab/test results, encounter notes, upcoming appointments, etc.  Non-urgent messages can be sent to your provider as well.   ?To learn more about what you can do with MyChart, go to [email or web address].   ? ?Your next appointment:   ?3 week(s) ? ?The format for your next appointment:   ?In Person ? ?Provider:   ?Almyra Deforest, PA-C or Diona Browner, NP      ? ? ?Other Instructions ? ? ?

## 2021-06-29 NOTE — Progress Notes (Signed)
?Cardiology Office Note:   ? ?Date:  07/01/2021  ? ?ID:  Tommy Douglas, DOB 1950/08/26, MRN [id number] ? ?PCP:  Loman Brooklyn, FNP ?  ?[facility] Providers ?Cardiologist:  Carlyle Dolly, MD    ? ?Referring MD: Loman Brooklyn, FNP  ? ?Chief Complaint  ?Patient presents with  ? Follow-up  ?  Seen for Dr. Gwenlyn Found  ? ? ?History of Present Illness:   ? ?[name] is a 71 y.o. male with a hx of CAD, chronic diastolic heart failure, carotid artery stenosis, PAD, hypertension, hyperlipidemia, CKD stage III, DM2, gout and anxiety.  He had prior stenting to LAD and RCA.  During the most recent cardiac cath in 2018, he underwent DES to mid RCA, he had residual disease in PDA and PL branch that was too small for intervention, subtotal occlusion of left circumflex with collaterals.  Echocardiogram in July 2019 showed EF 60 to 65%, mild concentric and moderate basal septal hypertrophy, grade 1 DD.  He also had prior history of left external iliac stenting by Dr. Gwenlyn Found.  Lower extremity arterial duplex in October 2021 showed 30 to 49% mid to distal SFA lesion, patent left proximal to mid external iliac stent.  No significant change when compared to the previous study.  Repeat study recommended in 12 months, however patient has declined to follow-up since.  He was seen in January 2023 for preoperative evaluation.  Myoview performed in February 2023 was low risk.  Carotid Doppler in February 2023 showed 1 to 39% bilateral ICA stenosis.  He presented to the ED in February 2023 with persistent epistaxis, ENT was consulted, however patient left AMA.  He was hospitalized in March 2023 in [facility] for pneumonia.  He was discharged home on 06/07/2021. ? ?More recently, he was seen by Diona Browner NP on 06/23/2021 for posthospital follow-up.  On physical exam, he had 1+ lower extremity pitting edema.  He also reported 12 pound weight gain as well.  He was told by physician at [facility] that he had  congestive heart failure.  Echocardiogram was performed however we do not have the record.  He was noted to be taking both Lasix and torsemide on follow-up.  Torsemide was stopped during the recent visit.  Lasix increased to 40 mg twice a day for 3 days before going down to 40 mg daily thereafter.  He was also placed on 20 mEq daily of potassium chloride. ? ?Patient presents today for follow-up.  His lower extremity edema has significantly improved however left lower extremity edema remaining at least 2+.  Looking at his weight over the past week, there is a steady decline in his weight.  He is now 6 pounds lower than his weight last week.  I recommended continue on the current therapy.  If his weight goes up by more than 3 pounds, he may take the second dose of Lasix and second dose of potassium.  He will need a repeat basic metabolic panel today.  Overall, he has been feeling well since medication adjustment since last week.  He had follow-up in 3 weeks with cardiology APP.  His next follow-up with Dr. Harl Bowie is near the end of July. ? ?Past Medical History:  ?Diagnosis Date  ? Anxiety   ? Arthritis   ? "qwhere" (12/20/2016)  ? Chronic lower back pain   ? CKD (chronic kidney disease), stage IV ([place])   ? Complication of anesthesia   ? "got the wrong kind of  anesthesia ~ 2000 when they were looking down into my stomach"  ? Coronary artery disease   ? Diabetic nephropathy ([place])   ? Gout   ? Heart disease   ? Hyperlipidemia   ? Hypertension   ? Pneumonia ~ 2015  ? Seasonal allergies   ? Type II diabetes mellitus ([place])   ? ? ?Past Surgical History:  ?Procedure Laterality Date  ? ABDOMINAL AORTOGRAM W/LOWER EXTREMITY N/A 06/02/2019  ? Procedure: ABDOMINAL AORTOGRAM W/LOWER EXTREMITY;  Surgeon: Lorretta Harp, MD;  Location: [place] [facility];  Service: Cardiovascular;  Laterality: N/A;  ? CARDIAC CATHETERIZATION  12/20/2016  ? CORONARY ANGIOPLASTY WITH STENT PLACEMENT  2004 X 2  ? "1st stent moved"  ? CORONARY STENT  INTERVENTION N/A 12/21/2016  ? Procedure: CORONARY STENT INTERVENTION;  Surgeon: Burnell Blanks, MD;  Location: [place] [facility];  Service: Cardiovascular;  Laterality: N/A;  ? ESOPHAGOGASTRODUODENOSCOPY  ~ 2000  ? LAPAROSCOPIC CHOLECYSTECTOMY    ? LEFT HEART CATH AND CORONARY ANGIOGRAPHY N/A 12/20/2016  ? Procedure: LEFT HEART CATH AND CORONARY ANGIOGRAPHY;  Surgeon: Burnell Blanks, MD;  Location: [place] [facility];  Service: Cardiovascular;  Laterality: N/A;  ? ? ?Current Medications: ?Current Meds  ?Medication Sig  ? albuterol (VENTOLIN HFA) 108 (90 Base) MCG/ACT inhaler Inhale 2 puffs into the lungs every 6 (six) hours as needed for wheezing or shortness of breath.  ? allopurinol (ZYLOPRIM) 100 MG tablet Take 1 tablet (100 mg total) by mouth 3 (three) times daily.  ? amLODipine (NORVASC) 5 MG tablet TAKE ONE (1) TABLET BY MOUTH EVERY DAY  ? aspirin EC 81 MG tablet Take 81 mg by mouth daily.  ? carvedilol (COREG) 25 MG tablet TAKE ONE (1) TABLET BY MOUTH TWO (2) TIMES DAILY (Patient taking differently: Take 25 mg by mouth 2 (two) times daily with a meal.)  ? cetirizine (ZYRTEC) 10 MG tablet Take 1 tablet (10 mg total) by mouth daily as needed (for allergies.). (Patient taking differently: Take 10 mg by mouth daily as needed for allergies.)  ? clopidogrel (PLAVIX) 75 MG tablet Take 1 tablet (75 mg total) by mouth at bedtime. (Patient taking differently: Take 75 mg by mouth daily.)  ? cyclobenzaprine (FLEXERIL) 10 MG tablet TAKE 1 TABLET AT BEDTIME AS NEEDED FOR MUSCLE SPASMS (Patient taking differently: Take 10 mg by mouth at bedtime as needed for muscle spasms.)  ? Evolocumab (REPATHA SURECLICK) 366 MG/ML SOAJ Inject 140 mg into the skin every 14 (fourteen) days. STOP NEXLIZET  ? furosemide (LASIX) 40 MG tablet Take 40 mg bid x 3 days, then take 40 mg daily  ? HYDROcodone-acetaminophen (NORCO) 10-325 MG tablet Take 1 tablet by mouth 4 (four) times daily.  ? Insulin Pen Needle (GNP ULTICARE PEN  NEEDLES) 32G X 6 MM MISC Use to give insulin daily Dx E11.22  ? isosorbide mononitrate (IMDUR) 60 MG 24 hr tablet TAKE ONE (1) TABLET BY MOUTH EVERY DAY  ? liraglutide (VICTOZA) 18 MG/3ML SOPN Inject 0.6 mg into the skin daily.  ? lisinopril (ZESTRIL) 40 MG tablet Take 1 tablet (40 mg total) by mouth at bedtime.  ? Multiple Vitamin (MULTIVITAMIN WITH MINERALS) TABS tablet Take 1 tablet by mouth daily. Centrum Silver  ? potassium chloride SA (KLOR-CON M) 20 MEQ tablet Take 1 tablet (20 mEq total) by mouth daily.  ? pregabalin (LYRICA) 75 MG capsule Take 1 capsule (75 mg total) by mouth 3 (three) times daily. New lower RENAL DOSING  ?  ? ?  Allergies:   Atorvastatin, Statins, Sulfa antibiotics, and Lexapro [escitalopram]  ? ?Social History  ? ?Socioeconomic History  ? Marital status: Widowed  ?  Spouse name: Not on file  ? Number of children: 7  ? Years of education: Not on file  ? Highest education level: High school graduate  ?Occupational History  ? Occupation: retired   ?  Comment: [employer]  ?Tobacco Use  ? Smoking status: Every Day  ?  Packs/day: 0.50  ?  Years: 44.00  ?  Pack years: 22.00  ?  Types: Cigarettes  ? Smokeless tobacco: Never  ?Vaping Use  ? Vaping Use: Never used  ?Substance and Sexual Activity  ? Alcohol use: No  ? Drug use: No  ? Sexual activity: Not Currently  ?Other Topics Concern  ? Not on file  ?Social History Narrative  ? Not on file  ? ?Social Determinants of Health  ? ?Financial Resource Strain: Not on file  ?Food Insecurity: Not on file  ?Transportation Needs: Not on file  ?Physical Activity: Not on file  ?Stress: Not on file  ?Social Connections: Not on file  ?  ? ?Family History: ?The patient's family history includes Alcohol abuse in his brother; Cancer in his daughter and mother; Diabetes in his mother; Other in his daughter, daughter, sister, sister, sister, and son; Stroke in his brother and father. ? ?ROS:   ?Please see the history of present illness.     ? All other systems reviewed and are negative. ? ?EKGs/Labs/Other Studies Reviewed:   ? ?The following studies were reviewed today: ? ?Myoview 04/27/2021 ?  Findings are consistent with prior inferior/infe

## 2021-07-01 ENCOUNTER — Encounter: Payer: Self-pay | Admitting: Physician Assistant

## 2021-07-01 ENCOUNTER — Encounter: Payer: Self-pay | Admitting: Family Medicine

## 2021-07-06 ENCOUNTER — Ambulatory Visit (INDEPENDENT_AMBULATORY_CARE_PROVIDER_SITE_OTHER): Payer: Medicare Other

## 2021-07-06 ENCOUNTER — Other Ambulatory Visit: Payer: Self-pay

## 2021-07-06 VITALS — Wt 157.0 lb

## 2021-07-06 DIAGNOSIS — Z0000 Encounter for general adult medical examination without abnormal findings: Secondary | ICD-10-CM | POA: Diagnosis not present

## 2021-07-06 MED ORDER — PANTOPRAZOLE SODIUM 40 MG PO TBEC: 40.0000 mg | DELAYED_RELEASE_TABLET | Freq: Every morning | ORAL | 0 refills | Status: DC

## 2021-07-06 NOTE — Progress Notes (Signed)
? ?Subjective:  ? Tommy Douglas is a 71 y.o. male who presents for Medicare Annual/Subsequent preventive examination. ? ?Virtual Visit via Telephone Note ? ?I connected with  Tommy Douglas on 07/06/21 at 12:00 PM EDT by telephone and verified that I am speaking with the correct person using two identifiers. ? ?Location: ?Patient: Home ?Provider: [facility] ?Persons participating in the virtual visit: patient/Nurse Health Advisor ?  ?I discussed the limitations, risks, security and privacy concerns of performing an evaluation and management service by telephone and the availability of in person appointments. The patient expressed understanding and agreed to proceed. ? ?Interactive audio and video telecommunications were attempted between this nurse and patient, however failed, due to patient having technical difficulties OR patient did not have access to video capability.  We continued and completed visit with audio only. ? ?Some vital signs may be absent or patient reported.  ? ?Chitara Clonch Dionne Ano, LPN  ? ?Review of Systems    ? ?Cardiac Risk Factors include: advanced age (>12men, >2 women);diabetes mellitus;dyslipidemia;family history of premature cardiovascular disease;hypertension;male gender;sedentary lifestyle;smoking/ tobacco exposure;Other (see comment), Risk factor comments: CAD, nephropathy, CHF, PAD, PVD ? ?   ?Objective:  ?  ?Today's Vitals  ? 07/06/21 1159  ?Weight: 157 lb (71.2 kg)  ? ?Body mass index is 25.34 kg/m?. ? ? ?  07/06/2021  ? 12:11 PM 05/17/2021  ?  3:42 PM 06/07/2020  ? 10:38 AM 06/02/2019  ?  7:05 AM 05/28/2019  ?  8:46 AM 10/22/2018  ? 12:15 PM 05/27/2018  ? 11:46 AM  ?Advanced Directives  ?Does Patient Have a Medical Advance Directive? Yes No Yes Yes Yes Yes Yes  ?Type of Paramedic of [place];Living will  Healthcare Power of [place];Living will Healthcare Power of [place];Living will  ?Does patient want to make  changes to medical advance directive?   No - Patient declined No - Patient declined No - Patient declined  No - Patient declined  ?Copy of [place] in Chart? No - copy requested  No - copy requested No - copy requested   No - copy requested  ?Would patient like information on creating a medical advance directive?  No - Patient declined       ? ? ?Current Medications (verified) ?Outpatient Encounter Medications as of 07/06/2021  ?Medication Sig  ? albuterol (VENTOLIN HFA) 108 (90 Base) MCG/ACT inhaler Inhale 2 puffs into the lungs every 6 (six) hours as needed for wheezing or shortness of breath.  ? allopurinol (ZYLOPRIM) 100 MG tablet Take 1 tablet (100 mg total) by mouth 3 (three) times daily.  ? amLODipine (NORVASC) 5 MG tablet TAKE ONE (1) TABLET BY MOUTH EVERY DAY  ? aspirin EC 81 MG tablet Take 81 mg by mouth daily.  ? carvedilol (COREG) 25 MG tablet TAKE ONE (1) TABLET BY MOUTH TWO (2) TIMES DAILY (Patient taking differently: Take 25 mg by mouth 2 (two) times daily with a meal.)  ? cetirizine (ZYRTEC) 10 MG tablet Take 1 tablet (10 mg total) by mouth daily as needed (for allergies.). (Patient taking differently: Take 10 mg by mouth daily as needed for allergies.)  ? clopidogrel (PLAVIX) 75 MG tablet Take 1 tablet (75 mg total) by mouth at bedtime. (Patient taking differently: Take 75 mg by mouth daily.)  ? cyclobenzaprine (FLEXERIL) 10 MG tablet TAKE 1 TABLET AT BEDTIME AS NEEDED FOR MUSCLE SPASMS (Patient taking differently: Take 10 mg by  mouth at bedtime as needed for muscle spasms.)  ? Evolocumab (REPATHA SURECLICK) 846 MG/ML SOAJ Inject 140 mg into the skin every 14 (fourteen) days. STOP NEXLIZET  ? furosemide (LASIX) 40 MG tablet Take 40 mg bid x 3 days, then take 40 mg daily  ? HYDROcodone-acetaminophen (NORCO) 10-325 MG tablet Take 1 tablet by mouth 4 (four) times daily.  ? Insulin Pen Needle (GNP ULTICARE PEN NEEDLES) 32G X 6 MM MISC Use to give insulin daily Dx E11.22  ? isosorbide  mononitrate (IMDUR) 60 MG 24 hr tablet TAKE ONE (1) TABLET BY MOUTH EVERY DAY  ? liraglutide (VICTOZA) 18 MG/3ML SOPN Inject 0.6 mg into the skin daily.  ? lisinopril (ZESTRIL) 40 MG tablet Take 1 tablet (40 mg total) by mouth at bedtime.  ? MAGNESIUM CHLORIDE PO Take 20 mg by mouth.  ? Multiple Vitamin (MULTIVITAMIN WITH MINERALS) TABS tablet Take 1 tablet by mouth daily. Centrum Silver  ? pantoprazole (PROTONIX) 40 MG tablet TAKE ONE TABLET EVERY MORNING  ? potassium chloride SA (KLOR-CON M) 20 MEQ tablet Take 1 tablet (20 mEq total) by mouth daily.  ? pregabalin (LYRICA) 75 MG capsule Take 1 capsule (75 mg total) by mouth 3 (three) times daily. New lower RENAL DOSING  ? nitroGLYCERIN (NITROSTAT) 0.4 MG SL tablet Place 1 tablet (0.4 mg total) under the tongue every 5 (five) minutes as needed for chest pain.  ? ?No facility-administered encounter medications on file as of 07/06/2021.  ? ? ?Allergies (verified) ?Atorvastatin, Statins, Sulfa antibiotics, and Lexapro [escitalopram]  ? ?History: ?Past Medical History:  ?Diagnosis Date  ? Anxiety   ? Arthritis   ? "qwhere" (12/20/2016)  ? Chronic lower back pain   ? CKD (chronic kidney disease), stage IV ([place])   ? Complication of anesthesia   ? "got the wrong kind of anesthesia ~ 2000 when they were looking down into my stomach"  ? Coronary artery disease   ? Diabetic nephropathy ([place])   ? Gout   ? Heart disease   ? Hyperlipidemia   ? Hypertension   ? Pneumonia ~ 2015  ? Seasonal allergies   ? Type II diabetes mellitus ([place])   ? ?Past Surgical History:  ?Procedure Laterality Date  ? ABDOMINAL AORTOGRAM W/LOWER EXTREMITY N/A 06/02/2019  ? Procedure: ABDOMINAL AORTOGRAM W/LOWER EXTREMITY;  Surgeon: Lorretta Harp, MD;  Location: [facility];  Service: Cardiovascular;  Laterality: N/A;  ? CARDIAC CATHETERIZATION  12/20/2016  ? CORONARY ANGIOPLASTY WITH STENT PLACEMENT  2004 X 2  ? "1st stent moved"  ? CORONARY STENT INTERVENTION N/A 12/21/2016  ? Procedure: CORONARY  STENT INTERVENTION;  Surgeon: Burnell Blanks, MD;  Location: [facility];  Service: Cardiovascular;  Laterality: N/A;  ? ESOPHAGOGASTRODUODENOSCOPY  ~ 2000  ? LAPAROSCOPIC CHOLECYSTECTOMY    ? LEFT HEART CATH AND CORONARY ANGIOGRAPHY N/A 12/20/2016  ? Procedure: LEFT HEART CATH AND CORONARY ANGIOGRAPHY;  Surgeon: Burnell Blanks, MD;  Location: [facility];  Service: Cardiovascular;  Laterality: N/A;  ? ?Family History  ?Problem Relation Age of Onset  ? Cancer Mother   ?     male  ? Diabetes Mother   ? Stroke Father   ?     x3  ? Stroke Brother   ?     x2   ? Other Sister   ?     bowel necrosis?   ? Other Daughter   ?     gout  ? Other Son   ?  gout  ? Alcohol abuse Brother   ? Other Sister   ?     pneumonia  ? Other Sister   ?     pneumonia  ? Cancer Daughter   ?     male  ? Other Daughter   ?     fluid on brain - disabled   ? ?Social History  ? ?Socioeconomic History  ? Marital status: Widowed  ?  Spouse name: Not on file  ? Number of children: 7  ? Years of education: Not on file  ? Highest education level: High school graduate  ?Occupational History  ? Occupation: retired   ?  Comment: [employer]  ?Tobacco Use  ? Smoking status: Every Day  ?  Packs/day: 0.50  ?  Years: 44.00  ?  Pack years: 22.00  ?  Types: Cigarettes  ? Smokeless tobacco: Never  ?Vaping Use  ? Vaping Use: Never used  ?Substance and Sexual Activity  ? Alcohol use: No  ? Drug use: No  ? Sexual activity: Not Currently  ?Other Topics Concern  ? Not on file  ?Social History Narrative  ? Son lives with him in basement apartment  ? Daughters live nearby and check on him frequently  ? ?Social Determinants of Health  ? ?Financial Resource Strain: Low Risk   ? Difficulty of Paying Living Expenses: Not very hard  ?Food Insecurity: No Food Insecurity  ? Worried About Charity fundraiser in the Last Year: Never true  ? Ran Out of Food in the Last Year: Never true  ?Transportation Needs: No  Transportation Needs  ? Lack of Transportation (Medical): No  ? Lack of Transportation (Non-Medical): No  ?Physical Activity: Inactive  ? Days of Exercise per Week: 0 days  ? Minutes of Exercise per [facility]

## 2021-07-06 NOTE — Patient Instructions (Signed)
Mr. Caperton , ?Thank you for taking time to come for your Medicare Wellness Visit. I appreciate your ongoing commitment to your health goals. Please review the following plan we discussed and let me know if I can assist you in the future.  ? ?Screening recommendations/referrals: ?Colonoscopy: Reschedule appointment with GI soon ?Recommended yearly ophthalmology/optometry visit for glaucoma screening and checkup ?Recommended yearly dental visit for hygiene and checkup ? ?Vaccinations: ?Influenza vaccine: Due every fall ?Pneumococcal vaccine: Done  03/17/2015 & 05/27/2018 ?Tdap vaccine: Due - recommended every 10 years ?Shingles vaccine: Due - Shingrix is 2 doses 2-6 months apart and over 90% effective     ?Covid-19: Done 10/03/2019 & 11/03/2019 - bring dates of boosters please ? ?Advanced directives: Please bring a copy of your health care power of attorney and living will to the office to be added to your chart at your convenience.  ? ?Conditions/risks identified: Aim for 30 minutes of exercise or brisk walking, 6-8 glasses of water, and 5 servings of fruits and vegetables each day.  ? ?Next appointment: Follow up in one year for your annual wellness visit.  ? ?Preventive Care 71 Years and Older, Male ? ?Preventive care refers to lifestyle choices and visits with your health care provider that can promote health and wellness. ?What does preventive care include? ?A yearly physical exam. This is also called an annual well check. ?Dental exams once or twice a year. ?Routine eye exams. Ask your health care provider how often you should have your eyes checked. ?Personal lifestyle choices, including: ?Daily care of your teeth and gums. ?Regular physical activity. ?Eating a healthy diet. ?Avoiding tobacco and drug use. ?Limiting alcohol use. ?Practicing safe sex. ?Taking low doses of aspirin every day. ?Taking vitamin and mineral supplements as recommended by your health care provider. ?What happens during an annual well  check? ?The services and screenings done by your health care provider during your annual well check will depend on your age, overall health, lifestyle risk factors, and family history of disease. ?Counseling  ?Your health care provider may ask you questions about your: ?Alcohol use. ?Tobacco use. ?Drug use. ?Emotional well-being. ?Home and relationship well-being. ?Sexual activity. ?Eating habits. ?History of falls. ?Memory and ability to understand (cognition). ?Work and work Statistician. ?Screening  ?You may have the following tests or measurements: ?Height, weight, and BMI. ?Blood pressure. ?Lipid and cholesterol levels. These may be checked every 5 years, or more frequently if you are over 35 years old. ?Skin check. ?Lung cancer screening. You may have this screening every year starting at age 71 if you have a 30-pack-year history of smoking and currently smoke or have quit within the past 15 years. ?Fecal occult blood test (FOBT) of the stool. You may have this test every year starting at age 71. ?Flexible sigmoidoscopy or colonoscopy. You may have a sigmoidoscopy every 5 years or a colonoscopy every 10 years starting at age 71. ?Prostate cancer screening. Recommendations will vary depending on your family history and other risks. ?Hepatitis C blood test. ?Hepatitis B blood test. ?Sexually transmitted disease (STD) testing. ?Diabetes screening. This is done by checking your blood sugar (glucose) after you have not eaten for a while (fasting). You may have this done every 1-3 years. ?Abdominal aortic aneurysm (AAA) screening. You may need this if you are a current or former smoker. ?Osteoporosis. You may be screened starting at age 71 if you are at high risk. ?Talk with your health care provider about your test results, treatment options, and  if necessary, the need for more tests. ?Vaccines  ?Your health care provider may recommend certain vaccines, such as: ?Influenza vaccine. This is recommended every  year. ?Tetanus, diphtheria, and acellular pertussis (Tdap, Td) vaccine. You may need a Td booster every 10 years. ?Zoster vaccine. You may need this after age 71. ?Pneumococcal 13-valent conjugate (PCV13) vaccine. One dose is recommended after age 71. ?Pneumococcal polysaccharide (PPSV23) vaccine. One dose is recommended after age 71. ?Talk to your health care provider about which screenings and vaccines you need and how often you need them. ?This information is not intended to replace advice given to you by your health care provider. Make sure you discuss any questions you have with your health care provider. ?Document Released: 04/09/2015 Document Revised: 12/01/2015 Document Reviewed: 01/12/2015 ?Elsevier Interactive Patient Education ? 2017 [place]. ? ?Fall Prevention in the Home ?Falls can cause injuries. They can happen to people of all ages. There are many things you can do to make your home safe and to help prevent falls. ?What can I do on the outside of my home? ?Regularly fix the edges of walkways and driveways and fix any cracks. ?Remove anything that might make you trip as you walk through a door, such as a raised step or threshold. ?Trim any bushes or trees on the path to your home. ?Use bright outdoor lighting. ?Clear any walking paths of anything that might make someone trip, such as rocks or tools. ?Regularly check to see if handrails are loose or broken. Make sure that both sides of any steps have handrails. ?Any raised decks and porches should have guardrails on the edges. ?Have any leaves, snow, or ice cleared regularly. ?Use sand or salt on walking paths during winter. ?Clean up any spills in your garage right away. This includes oil or grease spills. ?What can I do in the bathroom? ?Use night lights. ?Install grab bars by the toilet and in the tub and shower. Do not use towel bars as grab bars. ?Use non-skid mats or decals in the tub or shower. ?If you need to sit down in the shower, use a  plastic, non-slip stool. ?Keep the floor dry. Clean up any water that spills on the floor as soon as it happens. ?Remove soap buildup in the tub or shower regularly. ?Attach bath mats securely with double-sided non-slip rug tape. ?Do not have throw rugs and other things on the floor that can make you trip. ?What can I do in the bedroom? ?Use night lights. ?Make sure that you have a light by your bed that is easy to reach. ?Do not use any sheets or blankets that are too big for your bed. They should not hang down onto the floor. ?Have a firm chair that has side arms. You can use this for support while you get dressed. ?Do not have throw rugs and other things on the floor that can make you trip. ?What can I do in the kitchen? ?Clean up any spills right away. ?Avoid walking on wet floors. ?Keep items that you use a lot in easy-to-reach places. ?If you need to reach something above you, use a strong step stool that has a grab bar. ?Keep electrical cords out of the way. ?Do not use floor polish or wax that makes floors slippery. If you must use wax, use non-skid floor wax. ?Do not have throw rugs and other things on the floor that can make you trip. ?What can I do with my stairs? ?Do not leave any  items on the stairs. ?Make sure that there are handrails on both sides of the stairs and use them. Fix handrails that are broken or loose. Make sure that handrails are as long as the stairways. ?Check any carpeting to make sure that it is firmly attached to the stairs. Fix any carpet that is loose or worn. ?Avoid having throw rugs at the top or bottom of the stairs. If you do have throw rugs, attach them to the floor with carpet tape. ?Make sure that you have a light switch at the top of the stairs and the bottom of the stairs. If you do not have them, ask someone to add them for you. ?What else can I do to help prevent falls? ?Wear shoes that: ?Do not have high heels. ?Have rubber bottoms. ?Are comfortable and fit you  well. ?Are closed at the toe. Do not wear sandals. ?If you use a stepladder: ?Make sure that it is fully opened. Do not climb a closed stepladder. ?Make sure that both sides of the stepladder are locked into place. ?Ask so

## 2021-07-06 NOTE — Telephone Encounter (Signed)
Patient needs rf on Pantoprazole  ?

## 2021-07-15 ENCOUNTER — Other Ambulatory Visit: Payer: Self-pay | Admitting: Family Medicine

## 2021-07-15 DIAGNOSIS — I10 Essential (primary) hypertension: Secondary | ICD-10-CM

## 2021-07-19 ENCOUNTER — Ambulatory Visit: Payer: Medicare Other | Admitting: Family Medicine

## 2021-07-20 ENCOUNTER — Encounter: Payer: Self-pay | Admitting: Physician Assistant

## 2021-07-20 ENCOUNTER — Ambulatory Visit (INDEPENDENT_AMBULATORY_CARE_PROVIDER_SITE_OTHER): Payer: Medicare Other | Admitting: Physician Assistant

## 2021-07-20 ENCOUNTER — Encounter: Payer: Self-pay | Admitting: Family Medicine

## 2021-07-20 VITALS — BP 140/59 | HR 69 | Ht 67.0 in | Wt 163.6 lb

## 2021-07-20 DIAGNOSIS — I2510 Atherosclerotic heart disease of native coronary artery without angina pectoris: Secondary | ICD-10-CM

## 2021-07-20 DIAGNOSIS — I5032 Chronic diastolic (congestive) heart failure: Secondary | ICD-10-CM | POA: Diagnosis not present

## 2021-07-20 DIAGNOSIS — E119 Type 2 diabetes mellitus without complications: Secondary | ICD-10-CM

## 2021-07-20 DIAGNOSIS — E785 Hyperlipidemia, unspecified: Secondary | ICD-10-CM

## 2021-07-20 DIAGNOSIS — I10 Essential (primary) hypertension: Secondary | ICD-10-CM

## 2021-07-20 NOTE — Patient Instructions (Signed)
Medication Instructions:  ?Your physician recommends that you continue on your current medications as directed. Please refer to the Current Medication list given to you today. ? ?*If you need a refill on your cardiac medications before your next appointment, please call your pharmacy* ? ?Lab Work: ?Your physician recommends that you return for lab work in:  ?BMET  ? ?If you have labs (blood work) drawn today and your tests are completely normal, you will receive your results only by: ?MyChart Message (if you have MyChart) OR ?A paper copy in the mail ?If you have any lab test that is abnormal or we need to change your treatment, we will call you to review the results. ? ?Testing/Procedures: ?NONE ordered at this time of appointment  ? ?Follow-Up: ?At [facility], you and your health needs are our priority.  As part of our continuing mission to provide you with exceptional heart care, we have created designated Provider Care Teams.  These Care Teams include your primary Cardiologist (physician) and Advanced Practice Providers (APPs -  Physician Assistants and Nurse Practitioners) who all work together to provide you with the care you need, when you need it. ? ?Your next appointment:   ?As previously  scheduled  ? ?The format for your next appointment:   ?In Person ? ?Provider:   ? Carlyle Dolly, MD  ? ?Other Instructions ? ? ?Important Information About Sugar ? ? ? ? ? ? ?

## 2021-07-20 NOTE — Progress Notes (Signed)
?Cardiology Office Note:   ? ?Date:  07/22/2021  ? ?ID:  Tommy Douglas, DOB January 27, 1951, MRN [id number] ? ?PCP:  Loman Brooklyn, FNP ?  ?[facility] Providers ?Cardiologist:  Carlyle Dolly, MD    ? ?Referring MD: Loman Brooklyn, FNP  ? ?Chief Complaint  ?Patient presents with  ? Follow-up  ?  Seen for Dr. Harl Bowie  ? ? ?History of Present Illness:   ? ?Tommy Douglas is a 71 y.o. male with a hx of CAD, chronic diastolic heart failure, carotid artery stenosis, PAD, HTN, HLD, CKD stage III, DM2, gout and anxiety.  He had prior stenting to LAD and RCA.  During the most recent cardiac cath in 2018, he underwent DES to mid RCA, he had residual disease in PDA and PL branch that was too small for intervention, subtotal occlusion of left circumflex with collaterals.  Echocardiogram in July 2019 showed EF 60 to 65%, mild concentric and moderate basal septal hypertrophy, grade 1 DD.  He also had prior history of left external iliac stenting by Dr. Gwenlyn Found.  Lower extremity arterial duplex in October 2021 showed 30 to 49% mid to distal SFA lesion, patent left proximal to mid external iliac stent.  No significant change when compared to the previous study.  Repeat study recommended in 12 months, however patient has declined to follow-up since.  He was seen in January 2023 for preoperative evaluation.  Myoview performed in February 2023 was low risk.  Carotid Doppler in February 2023 showed 1 to 39% bilateral ICA stenosis.  He presented to the ED in February 2023 with persistent epistaxis, ENT was consulted, however patient left AMA.  He was hospitalized in March 2023 in [facility] for pneumonia.  He was discharged home on 06/07/2021. ? ?He was seen by Diona Browner NP on 06/23/2021 for posthospital follow-up.  On physical exam, he had 1+ lower extremity pitting edema.  He also reported 12 pound weight gain as well.  He was told by physician at [facility] that he had congestive heart failure.   Echocardiogram was performed however we do not have the record.  He was noted to be taking both Lasix and torsemide on follow-up.  Torsemide was stopped during the recent visit.  Lasix increased to 40 mg twice a day for 3 days before going down to 40 mg daily thereafter.  He was also placed on 20 mEq daily of potassium chloride. ? ?I last saw the patient on 06/29/2021, his lower extremity edema has slightly improved however left lower extremity edema remaining at least 2 to plus.  Looking at his weight, he had a steady decline.  I recommended continue on current therapy.  If his weight goes up by more than 3 pounds, he has been instructed to take a second dose of Lasix and a second dose of potassium.  Patient presents today for follow-up.  He is weight is 3 pounds lower than when I last saw him.  His lower extremity edema completely resolved.  I recommended basic metabolic panel today.  His breathing is good.  He denies any recent chest pain.  He can follow-up with Dr. Harl Bowie in 3 months. ? ? ?Past Medical History:  ?Diagnosis Date  ? Anxiety   ? Arthritis   ? "qwhere" (12/20/2016)  ? Chronic lower back pain   ? CKD (chronic kidney disease), stage IV ([place])   ? Complication of anesthesia   ? "got the wrong kind of anesthesia ~ 2000 when  they were looking down into my stomach"  ? Coronary artery disease   ? Diabetic nephropathy ([place])   ? Gout   ? Heart disease   ? Hyperlipidemia   ? Hypertension   ? Pneumonia ~ 2015  ? Seasonal allergies   ? Type II diabetes mellitus ([place])   ? ? ?Past Surgical History:  ?Procedure Laterality Date  ? ABDOMINAL AORTOGRAM W/LOWER EXTREMITY N/A 06/02/2019  ? Procedure: ABDOMINAL AORTOGRAM W/LOWER EXTREMITY;  Surgeon: Lorretta Harp, MD;  Location: [place] [facility];  Service: Cardiovascular;  Laterality: N/A;  ? CARDIAC CATHETERIZATION  12/20/2016  ? CORONARY ANGIOPLASTY WITH STENT PLACEMENT  2004 X 2  ? "1st stent moved"  ? CORONARY STENT INTERVENTION N/A 12/21/2016  ? Procedure: CORONARY  STENT INTERVENTION;  Surgeon: Burnell Blanks, MD;  Location: [place] [facility];  Service: Cardiovascular;  Laterality: N/A;  ? ESOPHAGOGASTRODUODENOSCOPY  ~ 2000  ? LAPAROSCOPIC CHOLECYSTECTOMY    ? LEFT HEART CATH AND CORONARY ANGIOGRAPHY N/A 12/20/2016  ? Procedure: LEFT HEART CATH AND CORONARY ANGIOGRAPHY;  Surgeon: Burnell Blanks, MD;  Location: [place] [facility];  Service: Cardiovascular;  Laterality: N/A;  ? ? ?Current Medications: ?Current Meds  ?Medication Sig  ? albuterol (VENTOLIN HFA) 108 (90 Base) MCG/ACT inhaler Inhale 2 puffs into the lungs every 6 (six) hours as needed for wheezing or shortness of breath.  ? allopurinol (ZYLOPRIM) 100 MG tablet Take 1 tablet (100 mg total) by mouth 3 (three) times daily.  ? amLODipine (NORVASC) 5 MG tablet TAKE ONE (1) TABLET BY MOUTH EVERY DAY  ? aspirin EC 81 MG tablet Take 81 mg by mouth daily.  ? carvedilol (COREG) 25 MG tablet TAKE ONE (1) TABLET BY MOUTH TWO (2) TIMES DAILY (Patient taking differently: Take 25 mg by mouth 2 (two) times daily with a meal.)  ? cetirizine (ZYRTEC) 10 MG tablet Take 1 tablet (10 mg total) by mouth daily as needed (for allergies.). (Patient taking differently: Take 10 mg by mouth daily as needed for allergies.)  ? clopidogrel (PLAVIX) 75 MG tablet Take 1 tablet (75 mg total) by mouth at bedtime. (Patient taking differently: Take 75 mg by mouth daily.)  ? cyclobenzaprine (FLEXERIL) 10 MG tablet TAKE 1 TABLET AT BEDTIME AS NEEDED FOR MUSCLE SPASMS (Patient taking differently: Take 10 mg by mouth at bedtime as needed for muscle spasms.)  ? Evolocumab (REPATHA SURECLICK) 858 MG/ML SOAJ Inject 140 mg into the skin every 14 (fourteen) days. STOP NEXLIZET  ? HYDROcodone-acetaminophen (NORCO) 10-325 MG tablet Take 1 tablet by mouth 4 (four) times daily.  ? Insulin Pen Needle (GNP ULTICARE PEN NEEDLES) 32G X 6 MM MISC Use to give insulin daily Dx E11.22  ? isosorbide mononitrate (IMDUR) 60 MG 24 hr tablet TAKE ONE (1)  TABLET BY MOUTH EVERY DAY  ? liraglutide (VICTOZA) 18 MG/3ML SOPN Inject 0.6 mg into the skin daily.  ? MAGNESIUM CHLORIDE PO Take 20 mg by mouth.  ? Multiple Vitamin (MULTIVITAMIN WITH MINERALS) TABS tablet Take 1 tablet by mouth daily. Centrum Silver  ? nitroGLYCERIN (NITROSTAT) 0.4 MG SL tablet Place 1 tablet (0.4 mg total) under the tongue every 5 (five) minutes as needed for chest pain.  ? pantoprazole (PROTONIX) 40 MG tablet Take 1 tablet (40 mg total) by mouth every morning.  ? pregabalin (LYRICA) 75 MG capsule Take 1 capsule (75 mg total) by mouth 3 (three) times daily. New lower RENAL DOSING  ? [DISCONTINUED] furosemide (LASIX) 40 MG tablet Take  40 mg bid x 3 days, then take 40 mg daily  ? [DISCONTINUED] lisinopril (ZESTRIL) 40 MG tablet TAKE ONE TABLET BY MOUTH AT BEDTIME  ? [DISCONTINUED] potassium chloride SA (KLOR-CON M) 20 MEQ tablet Take 1 tablet (20 mEq total) by mouth daily.  ?  ? ?Allergies:   Atorvastatin, Statins, Sulfa antibiotics, and Lexapro [escitalopram]  ? ?Social History  ? ?Socioeconomic History  ? Marital status: Widowed  ?  Spouse name: Not on file  ? Number of children: 7  ? Years of education: Not on file  ? Highest education level: High school graduate  ?Occupational History  ? Occupation: retired   ?  Comment: [employer]  ?Tobacco Use  ? Smoking status: Every Day  ?  Packs/day: 0.50  ?  Years: 44.00  ?  Pack years: 22.00  ?  Types: Cigarettes  ? Smokeless tobacco: Never  ?Vaping Use  ? Vaping Use: Never used  ?Substance and Sexual Activity  ? Alcohol use: No  ? Drug use: No  ? Sexual activity: Not Currently  ?Other Topics Concern  ? Not on file  ?Social History Narrative  ? Son lives with him in basement apartment  ? Daughters live nearby and check on him frequently  ? ?Social Determinants of Health  ? ?Financial Resource Strain: Low Risk   ? Difficulty of Paying Living Expenses: Not very hard  ?Food Insecurity: No Food Insecurity  ? Worried About  Charity fundraiser in the Last Year: Never true  ? Ran Out of Food in the Last Year: Never true  ?Transportation Needs: No Transportation Needs  ? Lack of Transportation (Medical): No  ? Lack of Transportation (Non-Med

## 2021-07-21 ENCOUNTER — Other Ambulatory Visit: Payer: Self-pay

## 2021-07-21 ENCOUNTER — Other Ambulatory Visit: Payer: Self-pay | Admitting: Family Medicine

## 2021-07-21 DIAGNOSIS — I10 Essential (primary) hypertension: Secondary | ICD-10-CM

## 2021-07-21 DIAGNOSIS — Z79899 Other long term (current) drug therapy: Secondary | ICD-10-CM

## 2021-07-21 DIAGNOSIS — I5032 Chronic diastolic (congestive) heart failure: Secondary | ICD-10-CM

## 2021-07-21 LAB — BASIC METABOLIC PANEL
BUN/Creatinine Ratio: 12 (ref 10–24)
BUN: 24 mg/dL (ref 8–27)
CO2: 23 mmol/L (ref 20–29)
Calcium: 9.2 mg/dL (ref 8.6–10.2)
Chloride: 104 mmol/L (ref 96–106)
Creatinine, Ser: 1.93 mg/dL — ABNORMAL HIGH (ref 0.76–1.27)
Glucose: 105 mg/dL — ABNORMAL HIGH (ref 70–99)
Potassium: 5.3 mmol/L — ABNORMAL HIGH (ref 3.5–5.2)
Sodium: 142 mmol/L (ref 134–144)
eGFR: 37 mL/min/{1.73_m2} — ABNORMAL LOW (ref >59)

## 2021-07-21 MED ORDER — FUROSEMIDE 40 MG PO TABS: ORAL_TABLET | ORAL | 1 refills | Status: DC

## 2021-07-21 NOTE — Progress Notes (Signed)
Kidney function slightly worse, stop potassium supplement. Reduce lasix to alternative 40 mg and 20mg  every other day. Repeat BMET in 2 weeks.

## 2021-07-22 ENCOUNTER — Encounter: Payer: Self-pay | Admitting: Physician Assistant

## 2021-08-01 ENCOUNTER — Telehealth: Payer: Self-pay

## 2021-08-01 NOTE — Telephone Encounter (Signed)
Khylin Wandersee (Key: [id number]) ?Repatha SureClick 140MG /ML auto-injectors ? ? ? ?Message from Plan ?PA Case: [id number], Status: Approved, Coverage Starts on: 05/02/2021 12:00:00 AM, Coverage Ends on: 08/01/2022 12:00:00 AM. ? ?Pharmacy aware ?

## 2021-08-05 ENCOUNTER — Encounter: Payer: Self-pay | Admitting: Family Medicine

## 2021-08-05 ENCOUNTER — Ambulatory Visit (INDEPENDENT_AMBULATORY_CARE_PROVIDER_SITE_OTHER): Payer: Medicare Other | Admitting: Family Medicine

## 2021-08-05 VITALS — BP 139/68 | HR 84 | Temp 97.8°F | Ht 67.0 in | Wt 168.2 lb

## 2021-08-05 DIAGNOSIS — N1832 Chronic kidney disease, stage 3b: Secondary | ICD-10-CM

## 2021-08-05 DIAGNOSIS — E785 Hyperlipidemia, unspecified: Secondary | ICD-10-CM

## 2021-08-05 DIAGNOSIS — E1121 Type 2 diabetes mellitus with diabetic nephropathy: Secondary | ICD-10-CM | POA: Diagnosis not present

## 2021-08-05 DIAGNOSIS — E1122 Type 2 diabetes mellitus with diabetic chronic kidney disease: Secondary | ICD-10-CM

## 2021-08-05 DIAGNOSIS — E1169 Type 2 diabetes mellitus with other specified complication: Secondary | ICD-10-CM

## 2021-08-05 DIAGNOSIS — I10 Essential (primary) hypertension: Secondary | ICD-10-CM | POA: Diagnosis not present

## 2021-08-05 LAB — BAYER DCA HB A1C WAIVED: HB A1C (BAYER DCA - WAIVED): 5.1 % (ref 4.8–5.6)

## 2021-08-05 MED ORDER — LISINOPRIL 40 MG PO TABS: 40.0000 mg | ORAL_TABLET | Freq: Every day | ORAL | 1 refills | Status: AC

## 2021-08-05 NOTE — Progress Notes (Signed)
? ?Assessment & Plan:  ?1. Type 2 diabetes mellitus with stage 3b chronic kidney disease, without long-term current use of insulin ([place]) ?Lab Results  ?Component Value Date  ? HGBA1C 4.8 04/19/2021  ? HGBA1C 5.3 10/06/2020  ? HGBA1C 5.7 04/23/2020  ?A1c 5.1 today which is increased from 4.8 3-4 months ago when his Victoza was decreased. ?- Diabetes is at goal of A1c < 7. ?- Medications:  stop Victoza all together ?- Home glucose monitoring: continue monitoring ?- Patient is not currently taking a statin. Patient is taking an ACE-inhibitor/ARB.  ? ?Diabetes Health Maintenance Due  ?Topic Date Due  ? FOOT EXAM  10/06/2021  ? HEMOGLOBIN A1C  10/17/2021  ? OPHTHALMOLOGY EXAM  01/28/2022  ?  ?Lab Results  ?Component Value Date  ? LABMICR 394.1 04/23/2020  ? LABMICR 77.2 03/11/2019  ? ?- Lipid panel ?- Bayer DCA Hb A1c Waived ? ?2. Diabetic nephropathy associated with type 2 diabetes mellitus ([place]) ?Continue Lisinopril. Recommended Wilder Glade, but patient does not wish to start any new medications at this time. He would like to discuss with nephrology first.  ?- lisinopril (ZESTRIL) 40 MG tablet; Take 1 tablet (40 mg total) by mouth at bedtime.  Dispense: 90 tablet; Refill: 1 ?- Ambulatory referral to Nephrology ? ?3. Stage 3b chronic kidney disease ([place]) ?Recommended Wilder Glade, but patient does not wish to start any new medications at this time. He would like to discuss with nephrology first.  ?- Ambulatory referral to Nephrology ? ?4. Essential hypertension ?Well controlled on current regimen.  ?- Lipid panel ?- lisinopril (ZESTRIL) 40 MG tablet; Take 1 tablet (40 mg total) by mouth at bedtime.  Dispense: 90 tablet; Refill: 1 ? ?5. Hyperlipidemia associated with type 2 diabetes mellitus ([place]) ?Well controlled on current regimen.  ? ? ?Return in about 3 months (around 11/05/2021) for annual physical. ? ?Hendricks Limes, MSN, APRN, FNP-C ?[place] ? ?Subjective:  ? ? Patient ID: Tommy Douglas, male     DOB: 1951-01-20, 71 y.o.   MRN: [id number] ? ?Patient Care Team: ?Loman Brooklyn, FNP as PCP - General (Family Medicine) ?Arnoldo Lenis, MD as PCP - Cardiology (Cardiology) ?Lorretta Harp, MD as Consulting Physician (Cardiology) ?Rourk, Cristopher Estimable, MD as Consulting Physician (Gastroenterology) ?Lavera Guise, [facility] (Pharmacist) ?Jasper Loser, MD as Referring Physician (Orthopedic Surgery) ?[facility] ?Almyra Deforest, [place] (Cardiology)  ? ?Chief Complaint:  ?Chief Complaint  ?Patient presents with  ? Medical Management of Chronic Issues  ? ? ?HPI: ?Tommy Douglas is a 71 y.o. male presenting on 08/05/2021 for Medical Management of Chronic Issues ? ?Diabetes: Patient presents for follow up of diabetes. Current symptoms include: none. Known diabetic complications: nephropathy. Medication compliance: Yes. Current diet: in general, a "healthy" diet  . Current exercise: none. Home blood sugar records: BGs are running  consistent with Hgb A1C. Is he  on ACE inhibitor or angiotensin II receptor blocker? Yes (Lisinopril). Is he on a statin? No (intolerant); he is using Repatha.  ? ?Hyperlipidemia: controlled with Repatha. ? ?New complaints: ?None ? ? ?Social history: ? ?Relevant past medical, surgical, family and social history reviewed and updated as indicated. Interim medical history since our last visit reviewed. ? ?Allergies and medications reviewed and updated. ? ?DATA REVIEWED: CHART IN EPIC ? ?ROS: Negative unless specifically indicated above in HPI.  ? ? ?Current Outpatient Medications:  ?  albuterol (VENTOLIN HFA) 108 (90 Base) MCG/ACT inhaler, Inhale 2 puffs into the lungs every 6 (six)  hours as needed for wheezing or shortness of breath., Disp: 18 g, Rfl: 5 ?  allopurinol (ZYLOPRIM) 100 MG tablet, Take 1 tablet (100 mg total) by mouth 3 (three) times daily., Disp: 270 tablet, Rfl: 1 ?  amLODipine (NORVASC) 5 MG tablet, TAKE ONE (1) TABLET BY MOUTH EVERY DAY, Disp: 90 tablet, Rfl: 1 ?  aspirin EC 81 MG  tablet, Take 81 mg by mouth daily., Disp: , Rfl:  ?  carvedilol (COREG) 25 MG tablet, TAKE ONE (1) TABLET BY MOUTH TWO (2) TIMES DAILY (Patient taking differently: Take 25 mg by mouth 2 (two) times daily with a meal.), Disp: 180 tablet, Rfl: 1 ?  cetirizine (ZYRTEC) 10 MG tablet, Take 1 tablet (10 mg total) by mouth daily as needed (for allergies.). (Patient taking differently: Take 10 mg by mouth daily as needed for allergies.), Disp: 90 tablet, Rfl: 3 ?  clopidogrel (PLAVIX) 75 MG tablet, Take 1 tablet (75 mg total) by mouth at bedtime. (Patient taking differently: Take 75 mg by mouth daily.), Disp: 90 tablet, Rfl: 1 ?  cyclobenzaprine (FLEXERIL) 10 MG tablet, TAKE 1 TABLET AT BEDTIME AS NEEDED FOR MUSCLE SPASMS (Patient taking differently: Take 10 mg by mouth at bedtime as needed for muscle spasms.), Disp: 90 tablet, Rfl: 1 ?  Evolocumab (REPATHA SURECLICK) 867 MG/ML SOAJ, Inject 140 mg into the skin every 14 (fourteen) days. STOP NEXLIZET, Disp: 2 mL, Rfl: 6 ?  furosemide (LASIX) 40 MG tablet, Take 40 mg and 20 mg every other day. 40 mg Mon,Wed,Fri, Sun and 20 mg Tue, Thurs, Sat, Disp: 60 tablet, Rfl: 1 ?  HYDROcodone-acetaminophen (NORCO) 10-325 MG tablet, Take 1 tablet by mouth 4 (four) times daily., Disp: , Rfl:  ?  Insulin Pen Needle (GNP ULTICARE PEN NEEDLES) 32G X 6 MM MISC, Use to give insulin daily Dx E11.22, Disp: 100 each, Rfl: 3 ?  isosorbide mononitrate (IMDUR) 60 MG 24 hr tablet, TAKE ONE (1) TABLET BY MOUTH EVERY DAY, Disp: 90 tablet, Rfl: 0 ?  liraglutide (VICTOZA) 18 MG/3ML SOPN, Inject 0.6 mg into the skin daily., Disp: 9 mL, Rfl: 0 ?  lisinopril (ZESTRIL) 40 MG tablet, TAKE ONE TABLET BY MOUTH AT BEDTIME, Disp: 30 tablet, Rfl: 0 ?  MAGNESIUM CHLORIDE PO, Take 20 mg by mouth., Disp: , Rfl:  ?  Multiple Vitamin (MULTIVITAMIN WITH MINERALS) TABS tablet, Take 1 tablet by mouth daily. Centrum Silver, Disp: , Rfl:  ?  pantoprazole (PROTONIX) 40 MG tablet, Take 1 tablet (40 mg total) by mouth every  morning., Disp: 90 tablet, Rfl: 0 ?  pregabalin (LYRICA) 75 MG capsule, Take 1 capsule (75 mg total) by mouth 3 (three) times daily. New lower RENAL DOSING, Disp: 90 capsule, Rfl: 5 ?  nitroGLYCERIN (NITROSTAT) 0.4 MG SL tablet, Place 1 tablet (0.4 mg total) under the tongue every 5 (five) minutes as needed for chest pain., Disp: 25 tablet, Rfl: 3  ? ?Allergies  ?Allergen Reactions  ? Atorvastatin   ?  All statins make him cough  ? Statins Cough  ?  Pt is currently taking pravastatin   ? Sulfa Antibiotics   ? Lexapro [Escitalopram] Palpitations  ? ?Past Medical History:  ?Diagnosis Date  ? Anxiety   ? Arthritis   ? "qwhere" (12/20/2016)  ? Chronic lower back pain   ? CKD (chronic kidney disease), stage IV ([place])   ? Complication of anesthesia   ? "got the wrong kind of anesthesia ~ 2000 when they were looking down into my  stomach"  ? Coronary artery disease   ? Diabetic nephropathy ([place])   ? Gout   ? Heart disease   ? Hyperlipidemia   ? Hypertension   ? Pneumonia ~ 2015  ? Seasonal allergies   ? Type II diabetes mellitus ([place])   ?  ?Past Surgical History:  ?Procedure Laterality Date  ? ABDOMINAL AORTOGRAM W/LOWER EXTREMITY N/A 06/02/2019  ? Procedure: ABDOMINAL AORTOGRAM W/LOWER EXTREMITY;  Surgeon: Lorretta Harp, MD;  Location: [place] [facility];  Service: Cardiovascular;  Laterality: N/A;  ? CARDIAC CATHETERIZATION  12/20/2016  ? CORONARY ANGIOPLASTY WITH STENT PLACEMENT  2004 X 2  ? "1st stent moved"  ? CORONARY STENT INTERVENTION N/A 12/21/2016  ? Procedure: CORONARY STENT INTERVENTION;  Surgeon: Burnell Blanks, MD;  Location: [place] [facility];  Service: Cardiovascular;  Laterality: N/A;  ? ESOPHAGOGASTRODUODENOSCOPY  ~ 2000  ? LAPAROSCOPIC CHOLECYSTECTOMY    ? LEFT HEART CATH AND CORONARY ANGIOGRAPHY N/A 12/20/2016  ? Procedure: LEFT HEART CATH AND CORONARY ANGIOGRAPHY;  Surgeon: Burnell Blanks, MD;  Location: [place] [facility];  Service: Cardiovascular;  Laterality: N/A;  ?  ?Social History   ? ?Socioeconomic History  ? Marital status: Widowed  ?  Spouse name: Not on file  ? Number of children: 7  ? Years of education: Not on file  ? Highest education level: High school graduate  ?Occupatio

## 2021-08-05 NOTE — Patient Instructions (Signed)
Stop Victoza 

## 2021-08-06 LAB — LIPID PANEL
Chol/HDL Ratio: 3.2 ratio (ref 0.0–5.0)
Cholesterol, Total: 113 mg/dL (ref 100–199)
HDL: 35 mg/dL — ABNORMAL LOW (ref >39)
LDL Chol Calc (NIH): 65 mg/dL (ref 0–99)
Triglycerides: 59 mg/dL (ref 0–149)
VLDL Cholesterol Cal: 13 mg/dL (ref 5–40)

## 2021-08-08 ENCOUNTER — Other Ambulatory Visit: Payer: Self-pay

## 2021-08-08 DIAGNOSIS — I5032 Chronic diastolic (congestive) heart failure: Secondary | ICD-10-CM

## 2021-08-08 DIAGNOSIS — Z79899 Other long term (current) drug therapy: Secondary | ICD-10-CM

## 2021-08-10 ENCOUNTER — Encounter: Payer: Self-pay | Admitting: Family Medicine

## 2021-08-11 ENCOUNTER — Encounter: Payer: Self-pay | Admitting: Internal Medicine

## 2021-08-11 ENCOUNTER — Ambulatory Visit: Payer: Self-pay | Admitting: Gastroenterology

## 2021-08-11 LAB — BASIC METABOLIC PANEL
BUN/Creatinine Ratio: 10 (ref 10–24)
BUN: 14 mg/dL (ref 8–27)
CO2: 23 mmol/L (ref 20–29)
Calcium: 9.2 mg/dL (ref 8.6–10.2)
Chloride: 106 mmol/L (ref 96–106)
Creatinine, Ser: 1.38 mg/dL — ABNORMAL HIGH (ref 0.76–1.27)
Glucose: 140 mg/dL — ABNORMAL HIGH (ref 70–99)
Potassium: 4.5 mmol/L (ref 3.5–5.2)
Sodium: 140 mmol/L (ref 134–144)
eGFR: 55 mL/min/{1.73_m2} — ABNORMAL LOW (ref >59)

## 2021-08-15 ENCOUNTER — Encounter: Payer: Self-pay | Admitting: *Deleted

## 2021-08-30 ENCOUNTER — Encounter: Payer: Self-pay | Admitting: Cardiology

## 2021-08-30 ENCOUNTER — Ambulatory Visit: Payer: Medicare Other | Admitting: Cardiology

## 2021-08-30 ENCOUNTER — Encounter: Payer: Self-pay | Admitting: *Deleted

## 2021-08-30 VITALS — BP 132/65 | HR 77 | Ht 66.0 in | Wt 172.4 lb

## 2021-08-30 DIAGNOSIS — I10 Essential (primary) hypertension: Secondary | ICD-10-CM | POA: Diagnosis not present

## 2021-08-30 DIAGNOSIS — E782 Mixed hyperlipidemia: Secondary | ICD-10-CM

## 2021-08-30 DIAGNOSIS — I6523 Occlusion and stenosis of bilateral carotid arteries: Secondary | ICD-10-CM | POA: Diagnosis not present

## 2021-08-30 DIAGNOSIS — I2510 Atherosclerotic heart disease of native coronary artery without angina pectoris: Secondary | ICD-10-CM | POA: Diagnosis not present

## 2021-08-30 MED ORDER — AMLODIPINE BESYLATE 10 MG PO TABS: 10.0000 mg | ORAL_TABLET | Freq: Every day | ORAL | 6 refills | Status: DC

## 2021-08-30 NOTE — Patient Instructions (Addendum)
Medication Instructions:  Increase Norvasc to 10mg  daily  Continue all other medications.     Labwork: none  Testing/Procedures: none  Follow-Up: 4 months   Any Other Special Instructions Will Be Listed Below (If Applicable).   If you need a refill on your cardiac medications before your next appointment, please call your pharmacy.

## 2021-08-30 NOTE — Progress Notes (Signed)
Clinical Summary Tommy Douglas is a 71 y.o.male seen today for follow up of the following medical problems.    1. CAD - history of prior LAD and RCA stenting.  - last intervention 11/2016 with DES to mid RCA, small vessel residual disease PDA and PL branches too small for intervention. Subtotal LCX with collaterals    04/2021 nuclear stress inferior/inferolateral infarct mild to moderate peri-infarct ischemia. Low to intermeidate risk - no recent chest pains.    2. PAD - followed by Dr Tommy Douglas - prior left exertnal iliac stent.    12/2019 LE arterial US: 30-49% mid and distal SFA. Patent left prox mid exteranl iliac stent.  12/2019 ABI: 0.9 Left 1.03 though noncompressible - some recent ongoing left groin pain   - has not wanted to follow up.      3. Hyperlipidemia - 08/2019 TC 120 TG 218 HDL 21 LDL 63 - side effects on more potent statins.    -recent significant elevation in LDL - he is on pravastatin 80mg  daily, bemepdoic acid/zetia -12/2020 TC 139 TG 136 HDL 29 LDL 86 - he was then started on repatha.  - Jan 2023 TC 108 TG 111 HDL 33 LDL 54 - 07/2021 TC 113 TG 59 HDL 35 LDL 65       4. Chronic diastolic HF - recent issues with edema, multiple visits with our PAs with improved swelling.  Taking lasix 40mg  alt days with 20mg  - home weight 155-162 lbs - 08/11/21 Cr 1.38 K 4.5   5. CKD 3 - Cr has been stable     6. HTN - home bp's 135/60s          Past Medical History:  Diagnosis Date   Anxiety    Arthritis    "qwhere" (12/20/2016)   Chronic lower back pain    CKD (chronic kidney disease), stage IV (HCC)    Complication of anesthesia    "got the wrong kind of anesthesia ~ 2000 when they were looking down into my stomach"   Coronary artery disease    Diabetic nephropathy ([place])    Gout    Heart disease    Hyperlipidemia    Hypertension    Pneumonia ~ 2015   Seasonal allergies    Type II diabetes mellitus (HCC)      Allergies  Allergen Reactions    Atorvastatin     All statins make him cough   Statins Cough    Pt is currently taking pravastatin    Sulfa Antibiotics    Lexapro [Escitalopram] Palpitations     Current Outpatient Medications  Medication Sig Dispense Refill   albuterol (VENTOLIN HFA) 108 (90 Base) MCG/ACT inhaler Inhale 2 puffs into the lungs every 6 (six) hours as needed for wheezing or shortness of breath. 18 g 5   allopurinol (ZYLOPRIM) 100 MG tablet Take 1 tablet (100 mg total) by mouth 3 (three) times daily. 270 tablet 1   aspirin EC 81 MG tablet Take 81 mg by mouth daily.     carvedilol (COREG) 25 MG tablet TAKE ONE (1) TABLET BY MOUTH TWO (2) TIMES DAILY (Patient taking differently: Take 25 mg by mouth 2 (two) times daily with a meal.) 180 tablet 1   cetirizine (ZYRTEC) 10 MG tablet Take 1 tablet (10 mg total) by mouth daily as needed (for allergies.). (Patient taking differently: Take 10 mg by mouth daily as needed for allergies.) 90 tablet 3   clopidogrel (PLAVIX) 75 MG tablet Take  1 tablet (75 mg total) by mouth at bedtime. (Patient taking differently: Take 75 mg by mouth daily.) 90 tablet 1   cyclobenzaprine (FLEXERIL) 10 MG tablet TAKE 1 TABLET AT BEDTIME AS NEEDED FOR MUSCLE SPASMS (Patient taking differently: Take 10 mg by mouth at bedtime as needed for muscle spasms.) 90 tablet 1   Evolocumab (REPATHA SURECLICK) 122 MG/ML SOAJ Inject 140 mg into the skin every 14 (fourteen) days. STOP NEXLIZET 2 mL 6   furosemide (LASIX) 40 MG tablet Take 40 mg and 20 mg every other day. 40 mg Mon,Wed,Fri, Sun and 20 mg Tue, Thurs, Sat 60 tablet 1   HYDROcodone-acetaminophen (NORCO) 10-325 MG tablet Take 1 tablet by mouth 4 (four) times daily.     Insulin Pen Needle (GNP ULTICARE PEN NEEDLES) 32G X 6 MM MISC Use to give insulin daily Dx E11.22 100 each 3   isosorbide mononitrate (IMDUR) 60 MG 24 hr tablet TAKE ONE (1) TABLET BY MOUTH EVERY DAY 90 tablet 0   lisinopril (ZESTRIL) 40 MG tablet Take 1 tablet (40 mg total) by  mouth at bedtime. 90 tablet 1   Multiple Vitamin (MULTIVITAMIN WITH MINERALS) TABS tablet Take 1 tablet by mouth daily. Centrum Silver     nitroGLYCERIN (NITROSTAT) 0.4 MG SL tablet Place 1 tablet (0.4 mg total) under the tongue every 5 (five) minutes as needed for chest pain. 25 tablet 3   pantoprazole (PROTONIX) 40 MG tablet Take 1 tablet (40 mg total) by mouth every morning. 90 tablet 0   pregabalin (LYRICA) 75 MG capsule Take 75 mg by mouth 3 (three) times daily.     amLODipine (NORVASC) 10 MG tablet Take 1 tablet (10 mg total) by mouth daily. 30 tablet 6   No current facility-administered medications for this visit.     Past Surgical History:  Procedure Laterality Date   ABDOMINAL AORTOGRAM W/LOWER EXTREMITY N/A 06/02/2019   Procedure: ABDOMINAL AORTOGRAM W/LOWER EXTREMITY;  Surgeon: Tommy Harp, MD;  Location: [facility];  Service: Cardiovascular;  Laterality: N/A;   CARDIAC CATHETERIZATION  12/20/2016   CORONARY ANGIOPLASTY WITH STENT PLACEMENT  2004 X 2   "1st stent moved"   CORONARY STENT INTERVENTION N/A 12/21/2016   Procedure: CORONARY STENT INTERVENTION;  Surgeon: Tommy Blanks, MD;  Location: [facility];  Service: Cardiovascular;  Laterality: N/A;   ESOPHAGOGASTRODUODENOSCOPY  ~ 2000   LAPAROSCOPIC CHOLECYSTECTOMY     LEFT HEART CATH AND CORONARY ANGIOGRAPHY N/A 12/20/2016   Procedure: LEFT HEART CATH AND CORONARY ANGIOGRAPHY;  Surgeon: Tommy Blanks, MD;  Location: [facility];  Service: Cardiovascular;  Laterality: N/A;     Allergies  Allergen Reactions   Atorvastatin     All statins make him cough   Statins Cough    Pt is currently taking pravastatin    Sulfa Antibiotics    Lexapro [Escitalopram] Palpitations      Family History  Problem Relation Age of Onset   Cancer Mother        male   Diabetes Mother    Stroke Father        x3   Stroke Brother        x2    Other Sister        bowel necrosis?    Other  Daughter        gout   Other Son        gout   Alcohol abuse Brother    Other Sister  pneumonia   Other Sister        pneumonia   Cancer Daughter        male   Other Daughter        fluid on brain - disabled      Social History Tommy Douglas reports that he has been smoking cigarettes. He has a 22.00 pack-year smoking history. He has never used smokeless tobacco. Mr. Mcneill reports no history of alcohol use.   Review of Systems CONSTITUTIONAL: No weight loss, fever, chills, weakness or fatigue.  HEENT: Eyes: No visual loss, blurred vision, double vision or yellow sclerae.No hearing loss, sneezing, congestion, runny nose or sore throat.  SKIN: No rash or itching.  CARDIOVASCULAR: per hpi RESPIRATORY: No shortness of breath, cough or sputum.  GASTROINTESTINAL: No anorexia, nausea, vomiting or diarrhea. No abdominal pain or blood.  GENITOURINARY: No burning on urination, no polyuria NEUROLOGICAL: No headache, dizziness, syncope, paralysis, ataxia, numbness or tingling in the extremities. No change in bowel or bladder control.  MUSCULOSKELETAL: No muscle, back pain, joint pain or stiffness.  LYMPHATICS: No enlarged nodes. No history of splenectomy.  PSYCHIATRIC: No history of depression or anxiety.  ENDOCRINOLOGIC: No reports of sweating, cold or heat intolerance. No polyuria or polydipsia.  [name]   Physical Examination Today's Vitals   08/30/21 1116  BP: 138/70  Pulse: 77  SpO2: 92%  Weight: 172 lb 6.4 oz (78.2 kg)  Height: 5\' 6"  (1.676 m)   Body mass index is 27.83 kg/m.  Gen: resting comfortably, no acute distress HEENT: no scleral icterus, pupils equal round and reactive, no palptable cervical adenopathy,  CV: RRR, no m/r/g no jvd Resp: Clear to auscultation bilaterally GI: abdomen is soft, non-tender, non-distended, normal bowel sounds, no hepatosplenomegaly MSK: extremities are warm, no edema.  Skin: warm, no rash Neuro:  no focal deficits Psych:  appropriate affect   Diagnostic Studies 12/2019 LE [place] Summary:  Left: No significant change as compared to previous study. Heterogenous  plaque throughout.  30-49% stenosis in the mid and distal SFA.   No significant findings noted in the left groin during supine and standing  positions. There is a lymph node at area of pain, measuring 1.5 x .90 x  .98 cm. This lymph node appears to be normal. Inguinal hernia not evident,  but can not be excluded.    09/2017 echo Study Conclusions   - Procedure narrative: Transthoracic echocardiography. Image    quality was adequate. The study was technically difficult, as a    result of poor patient compliance.  - Left ventricle: The cavity size was normal. There was mild    concentric and moderate basal septal hypertrophy. Systolic    function was normal. The estimated ejection fraction was in the    range of 60% to 65%. Wall motion was normal; there were no    regional wall motion abnormalities. Doppler parameters are    consistent with abnormal left ventricular relaxation (grade 1    diastolic dysfunction). Doppler parameters are consistent with    indeterminate ventricular filling pressure.  - Aortic valve: Mildly calcified annulus. Trileaflet; normal    thickness leaflets.  - Atrial septum: No defect or patent foramen ovale was identified.  - Tricuspid valve: There was mild regurgitation.  - Pericardium, extracardiac: A prominent pericardial fat pad was    present.    11/2016 cath   Ost RCA to Mid RCA lesion, 0 %stenosed. Prox RCA lesion, 40 %stenosed. Mid RCA to Dist RCA lesion, 90 %  stenosed. Dist RCA lesion, 99 %stenosed. Ost RPDA lesion, 99 %stenosed. Prox Cx to Mid Cx lesion, 99 %stenosed. Ost 3rd Mrg to 3rd Mrg lesion, 80 %stenosed. Ost LAD to Prox LAD lesion, 10 %stenosed. Prox LAD to Mid LAD lesion, 40 %stenosed. Dist LAD lesion, 50 %stenosed.   1. Triple vessel CAD 2. The LAD is a large caliber vessel that courses to the  apex. The proximal vessel appears to have a stent but this could be heavy calcification. (No previous cath records available). The mid and distal vessel has mild to moderate non-obstructive disease.  3. The Circumflex has a sub-total occlusion in the proximal segment and fills distally from right to left and left to left collaterals.  4. The RCA is a large dominant vessel with a patent proximal stent. The stented segment has focal moderate restenosis. The mid vessel has severe stenosis. The small to moderate caliber PDA and posterolateral artery have severe stenosis.  5. Normal filling pressures.    Recommendations: He has stage 3 CKD. He has severe CAD. I think we should consider PCI of the RCA and possible the Circumflex artery. Given his renal dysfunction, will admit and hydrate today and plan PCI later this week. Continue ASA/Plavix.    11/2016 cath intervention 1. Unstable angina with inferior wall ischemia on nuclear stress test and severe mid RCA stenosis on diagnostic cath yesterday. Successful PTCA/DES x 1 mid RCA. There is residual disease in the small caliber PDA and posterolateral branches. These vessels are too small for PCI.  2. The IC team has reviewed the Circumflex artery disease. This is a small to moderate caliber artery with sub-total proximal to mid occlusion and diffuse disease throughout with collateral filling. Will not attempt PCI of this vessel.    Continue DAPT for one year with ASA and Plavix.   04/2021 nuclear stress  Findings are consistent with prior inferior/inferolateral myocardial infarction with mild to moderate peri-infarct ischemia.   Low to intermediate risk   No ST deviation was noted.   LV perfusion is abnormal.   Left ventricular function is normal. End diastolic cavity size is normal.  Assessment and Plan   1. CAD - no symptoms, continue current meds   2. PAD - prior stent, recent imaging was normal - has not wanted to f/u, monitor at this time.  Could establish with vein and vascular if recurrent issues in the future.    3. Hyperlipidemia -at goal, continue currentmeds   4. HTN -above goal, increase norvasc to 10mg  daily.       5. Carotid stenosis - mild bilateral disease on recent US, continue to monitor.        Low to intermediate risk stress test. Recommend proceeding with hip surgery as planned.        Tommy Douglas, M.D..

## 2021-09-05 ENCOUNTER — Other Ambulatory Visit: Payer: Self-pay | Admitting: Nurse Practitioner

## 2021-09-15 ENCOUNTER — Telehealth: Payer: Self-pay | Admitting: Cardiology

## 2021-09-15 MED ORDER — AMLODIPINE BESYLATE 5 MG PO TABS: 7.5000 mg | ORAL_TABLET | Freq: Every day | ORAL | 1 refills | Status: AC

## 2021-09-15 NOTE — Telephone Encounter (Signed)
    Lower extremity swelling is a common side effect of the higher dose of Amlodipine. Would reduce to 7.5mg  daily to see if symptoms improve. If no improvement, may need to reduce back to 5mg  daily and adjust other BP medications. Appears the patient is already on Lasix 40mg  daily. He can take an extra 20mg  for the next several days to see if weight returns to baseline.   Signed, Erma Heritage, PA-C 09/15/2021, 2:22 PM

## 2021-09-15 NOTE — Telephone Encounter (Signed)
Tommy Douglas at [facility] seeing patient today for pain management and reports patient has  a lot of BLE. Denies SOB or chest pain BP 148/68 Lungs clear Tommy Douglas said patient reports swelling got worse after increase of amlodipine to 10 mg Tommy Douglas request we contact patient today with plan

## 2021-09-15 NOTE — Telephone Encounter (Signed)
PA is calling because patient has extreme edema (legs), weight is up by 10lbs of fluid and bp is elevated. Please advise   Patient is in the office with them now

## 2021-09-15 NOTE — Telephone Encounter (Signed)
Patient informed and verbalized understanding of plan. Aware to break 40 mg lasix in half daily to equal 20 mg

## 2021-09-16 ENCOUNTER — Other Ambulatory Visit: Payer: Self-pay | Admitting: Family Medicine

## 2021-09-16 DIAGNOSIS — I2510 Atherosclerotic heart disease of native coronary artery without angina pectoris: Secondary | ICD-10-CM

## 2021-10-06 ENCOUNTER — Telehealth: Payer: Self-pay | Admitting: Family Medicine

## 2021-10-23 ENCOUNTER — Other Ambulatory Visit: Payer: Self-pay | Admitting: Family Medicine

## 2021-10-23 DIAGNOSIS — I5032 Chronic diastolic (congestive) heart failure: Secondary | ICD-10-CM

## 2021-10-23 DIAGNOSIS — M1A9XX0 Chronic gout, unspecified, without tophus (tophi): Secondary | ICD-10-CM

## 2021-10-23 DIAGNOSIS — I2510 Atherosclerotic heart disease of native coronary artery without angina pectoris: Secondary | ICD-10-CM

## 2021-10-23 DIAGNOSIS — I10 Essential (primary) hypertension: Secondary | ICD-10-CM

## 2021-10-24 ENCOUNTER — Ambulatory Visit: Payer: Medicare Other | Admitting: Cardiology

## 2021-11-09 ENCOUNTER — Encounter: Payer: Medicare Other | Admitting: Family Medicine

## 2021-11-28 ENCOUNTER — Other Ambulatory Visit: Payer: Self-pay | Admitting: Physician Assistant

## 2021-11-28 ENCOUNTER — Other Ambulatory Visit: Payer: Self-pay | Admitting: Family Medicine

## 2021-12-06 ENCOUNTER — Ambulatory Visit (INDEPENDENT_AMBULATORY_CARE_PROVIDER_SITE_OTHER): Payer: Medicare Other | Admitting: Nurse Practitioner

## 2021-12-06 ENCOUNTER — Encounter: Payer: Self-pay | Admitting: Nurse Practitioner

## 2021-12-06 VITALS — BP 147/69 | HR 86 | Temp 98.7°F | Ht 66.0 in | Wt 165.0 lb

## 2021-12-06 DIAGNOSIS — I5032 Chronic diastolic (congestive) heart failure: Secondary | ICD-10-CM | POA: Diagnosis not present

## 2021-12-06 DIAGNOSIS — I10 Essential (primary) hypertension: Secondary | ICD-10-CM

## 2021-12-06 DIAGNOSIS — Z0000 Encounter for general adult medical examination without abnormal findings: Secondary | ICD-10-CM | POA: Insufficient documentation

## 2021-12-06 DIAGNOSIS — Z0001 Encounter for general adult medical examination with abnormal findings: Secondary | ICD-10-CM

## 2021-12-06 NOTE — Assessment & Plan Note (Signed)
Completed annual physical exam.  Education provided to patient on health maintenance and preventative care.  Printed handouts given.  Follow-up in 1 year.

## 2021-12-06 NOTE — Patient Instructions (Signed)
Health Maintenance After Age 71 After age 71, you are at a higher risk for certain long-term diseases and infections as well as injuries from falls. Falls are a major cause of broken bones and head injuries in people who are older than age 71. Getting regular preventive care can help to keep you healthy and well. Preventive care includes getting regular testing and making lifestyle changes as recommended by your health care provider. Talk with your health care provider about: Which screenings and tests you should have. A screening is a test that checks for a disease when you have no symptoms. A diet and exercise plan that is right for you. What should I know about screenings and tests to prevent falls? Screening and testing are the best ways to find a health problem early. Early diagnosis and treatment give you the best chance of managing medical conditions that are common after age 71. Certain conditions and lifestyle choices may make you more likely to have a fall. Your health care provider may recommend: Regular vision checks. Poor vision and conditions such as cataracts can make you more likely to have a fall. If you wear glasses, make sure to get your prescription updated if your vision changes. Medicine review. Work with your health care provider to regularly review all of the medicines you are taking, including over-the-counter medicines. Ask your health care provider about any side effects that may make you more likely to have a fall. Tell your health care provider if any medicines that you take make you feel dizzy or sleepy. Strength and balance checks. Your health care provider may recommend certain tests to check your strength and balance while standing, walking, or changing positions. Foot health exam. Foot pain and numbness, as well as not wearing proper footwear, can make you more likely to have a fall. Screenings, including: Osteoporosis screening. Osteoporosis is a condition that causes  the bones to get weaker and break more easily. Blood pressure screening. Blood pressure changes and medicines to control blood pressure can make you feel dizzy. Depression screening. You may be more likely to have a fall if you have a fear of falling, feel depressed, or feel unable to do activities that you used to do. Alcohol use screening. Using too much alcohol can affect your balance and may make you more likely to have a fall. Follow these instructions at home: Lifestyle Do not drink alcohol if: Your health care provider tells you not to drink. If you drink alcohol: Limit how much you have to: 0-1 drink a day for women. 0-2 drinks a day for men. Know how much alcohol is in your drink. In the [place] one drink equals one 12 oz bottle of beer (355 mL), one 5 oz glass of wine (148 mL), or one 1 oz glass of hard liquor (44 mL). Do not use any products that contain nicotine or tobacco. These products include cigarettes, chewing tobacco, and vaping devices, such as e-cigarettes. If you need help quitting, ask your health care provider. Activity  Follow a regular exercise program to stay fit. This will help you maintain your balance. Ask your health care provider what types of exercise are appropriate for you. If you need a cane or walker, use it as recommended by your health care provider. Wear supportive shoes that have nonskid soles. Safety  Remove any tripping hazards, such as rugs, cords, and clutter. Install safety equipment such as grab bars in bathrooms and safety rails on stairs. Keep rooms and walkways   well-lit. General instructions Talk with your health care provider about your risks for falling. Tell your health care provider if: You fall. Be sure to tell your health care provider about all falls, even ones that seem minor. You feel dizzy, tiredness (fatigue), or off-balance. Take over-the-counter and prescription medicines only as told by your health care provider. These include  supplements. Eat a healthy diet and maintain a healthy weight. A healthy diet includes low-fat dairy products, low-fat (lean) meats, and fiber from whole grains, beans, and lots of fruits and vegetables. Stay current with your vaccines. Schedule regular health, dental, and eye exams. Summary Having a healthy lifestyle and getting preventive care can help to protect your health and wellness after age 71. Screening and testing are the best way to find a health problem early and help you avoid having a fall. Early diagnosis and treatment give you the best chance for managing medical conditions that are more common for people who are older than age 71. Falls are a major cause of broken bones and head injuries in people who are older than age 71. Take precautions to prevent a fall at home. Work with your health care provider to learn what changes you can make to improve your health and wellness and to prevent falls. This information is not intended to replace advice given to you by your health care provider. Make sure you discuss any questions you have with your health care provider. Document Revised: 08/02/2020 Document Reviewed: 08/02/2020 Elsevier Patient Education  2023 [employer]  

## 2021-12-06 NOTE — Progress Notes (Signed)
Established Patient Office Visit  Subjective   Patient ID: Tommy Douglas, male    DOB: Dec 12, 1950  Age: 71 y.o. MRN: [id number]  Chief Complaint  Patient presents with   Annual Exam   Establish Care    HPI  .   Encounter for general adult medical examination Physical; Patient's last physical exam was 1 year ago .  Weight: Appropriate for height (BMI less than 27%) ;  Blood Pressure: Normal (BP less greater 120/80) ;  Medical History: Patient history reviewed ; Family history reviewed ;  Allergies Reviewed: No change in current allergies ;  Medications Reviewed: Medications reviewed - no changes ;  Lipids: Normal lipid levels  Smoking: Life-long -smoker  Physical Activity: Exercises at least 3 times per week ; No Alcohol/Drug Use: Is a non-drinker ; No illicit drug use ;  Patient is not afflicted from Stress Incontinence and Urge Incontinence  Safety: reviewed ; Patient wears a seat belt, has smoke detectors, has carbon monoxide detectors, practices appropriate gun safety, and wears sunscreen with extended sun exposure. Dental Care: annual cleanings, brushes and flosses daily. Ophthalmology/Optometry: Annual visit.  Hearing loss: none Vision impairments: none  Patient Active Problem List   Diagnosis Date Noted   Annual physical exam 12/06/2021   Epistaxis 05/18/2021   Psychosexual dysfunction with inhibited sexual excitement 04/20/2021   Arthritis of left hip 04/20/2021   Diabetic nephropathy (HCC)    Chronic heart failure with preserved ejection fraction ([place]) 09/21/2019   PAD (peripheral artery disease) ([place]) 06/02/2019   Pain in joint involving pelvic region and thigh 12/26/2017   Chronic kidney disease, stage 3a ([place]) 08/26/2017   Cough with hemoptysis 03/29/2017   PVD (peripheral vascular disease) ([place]) 12/22/2016   Unstable angina ([place])    Essential hypertension 01/10/2016   CAD S/P percutaneous coronary angioplasty 01/10/2016   Type 2 diabetes mellitus ([place])  01/10/2016   Hyperlipidemia associated with type 2 diabetes mellitus ([place]) 01/10/2016   Chronic gout 01/10/2016   Chronic nonseasonal allergic rhinitis due to pollen 01/10/2016   DDD (degenerative disc disease), lumbar 01/10/2016   Past Medical History:  Diagnosis Date   Anxiety    Arthritis    "qwhere" (12/20/2016)   Chronic lower back pain    CKD (chronic kidney disease), stage IV (HCC)    Complication of anesthesia    "got the wrong kind of anesthesia ~ 2000 when they were looking down into my stomach"   Coronary artery disease    Diabetic nephropathy ([place])    Gout    Heart disease    Hyperlipidemia    Hypertension    Pneumonia ~ 2015   Seasonal allergies    Type II diabetes mellitus ([place])    Past Surgical History:  Procedure Laterality Date   ABDOMINAL AORTOGRAM W/LOWER EXTREMITY N/A 06/02/2019   Procedure: ABDOMINAL AORTOGRAM W/LOWER EXTREMITY;  Surgeon: Lorretta Harp, MD;  Location: [facility];  Service: Cardiovascular;  Laterality: N/A;   CARDIAC CATHETERIZATION  12/20/2016   CORONARY ANGIOPLASTY WITH STENT PLACEMENT  2004 X 2   "1st stent moved"   CORONARY STENT INTERVENTION N/A 12/21/2016   Procedure: CORONARY STENT INTERVENTION;  Surgeon: Burnell Blanks, MD;  Location: [facility];  Service: Cardiovascular;  Laterality: N/A;   ESOPHAGOGASTRODUODENOSCOPY  ~ 2000   LAPAROSCOPIC CHOLECYSTECTOMY     LEFT HEART CATH AND CORONARY ANGIOGRAPHY N/A 12/20/2016   Procedure: LEFT HEART CATH AND CORONARY ANGIOGRAPHY;  Surgeon: Burnell Blanks, MD;  Location:  [facility];  Service: Cardiovascular;  Laterality: N/A;   Social History   Tobacco Use   Smoking status: Every Day    Packs/day: 0.50    Years: 44.00    Total pack years: 22.00    Types: Cigarettes   Smokeless tobacco: Never  Vaping Use   Vaping Use: Never used  Substance Use Topics   Alcohol use: No   Drug use: No   Social History   Socioeconomic History   Marital  status: Widowed    Spouse name: Not on file   Number of children: 7   Years of education: Not on file   Highest education level: High school graduate  Occupational History   Occupation: retired     Comment: Designer, television/film set and Manufacturing engineer co.  Tobacco Use   Smoking status: Every Day    Packs/day: 0.50    Years: 44.00    Total pack years: 22.00    Types: Cigarettes   Smokeless tobacco: Never  Vaping Use   Vaping Use: Never used  Substance and Sexual Activity   Alcohol use: No   Drug use: No   Sexual activity: Not Currently  Other Topics Concern   Not on file  Social History Narrative   Son lives with him in basement apartment   Daughters live nearby and check on him frequently   Social Determinants of Health   Financial Resource Strain: Low Risk  (07/06/2021)   Overall Financial Resource Strain (CARDIA)    Difficulty of Paying Living Expenses: Not very hard  Food Insecurity: No Food Insecurity (07/06/2021)   Hunger Vital Sign    Worried About Running Out of Food in the Last Year: Never true    [place] in the Last Year: Never true  Transportation Needs: No Transportation Needs (07/06/2021)   PRAPARE - Hydrologist (Medical): No    Lack of Transportation (Non-Medical): No  Physical Activity: Inactive (07/06/2021)   Exercise Vital Sign    Days of Exercise per Week: 0 days    Minutes of Exercise per Session: 0 min  Stress: No Stress Concern Present (07/06/2021)   [place]    Feeling of Stress : Only a little  Social Connections: Socially Integrated (07/06/2021)   Social Connection and Isolation Panel [NHANES]    Frequency of Communication with Friends and Family: More than three times a week    Frequency of Social Gatherings with Friends and Family: More than three times a week    Attends Religious Services: More than 4 times per year    Active Member of [employer] or  Organizations: Yes    Attends Archivist Meetings: More than 4 times per year    Marital Status: Living with partner  Intimate Partner Violence: Not At Risk (07/06/2021)   Humiliation, Afraid, Rape, and Kick questionnaire    Fear of Current or Ex-Partner: No    Emotionally Abused: No    Physically Abused: No    Sexually Abused: No   Family Status  Relation Name Status   Mother  Deceased at age 48   Father  Deceased at age 46   Brother  Deceased   Sister  Deceased   Daughter crystal Alive   Son Washington   MGM  Deceased   MGF  Deceased   [place]  Deceased   PGF  Deceased   Son Vita Erm   Brother  Deceased   Sister  Deceased   Sister  Deceased   Daughter deanna Alive   Daughter starla Alive   Daughter carolyn Alive   Daughter christina Alive   Family History  Problem Relation Age of Onset   Cancer Mother        male   Diabetes Mother    Stroke Father        x3   Stroke Brother        x2    Other Sister        bowel necrosis?    Other Daughter        gout   Other Son        gout   Alcohol abuse Brother    Other Sister        pneumonia   Other Sister        pneumonia   Cancer Daughter        male   Other Daughter        fluid on brain - disabled    Allergies  Allergen Reactions   Atorvastatin     All statins make him cough   Statins Cough    Pt is currently taking pravastatin    Sulfa Antibiotics    Lexapro [Escitalopram] Palpitations      Review of Systems  Constitutional:  Negative for chills and fever.  HENT: Negative.    Respiratory: Negative.    Cardiovascular: Negative.   Gastrointestinal: Negative.   Genitourinary: Negative.   Musculoskeletal: Negative.   Skin: Negative.  Negative for rash.  Neurological: Negative.   Psychiatric/Behavioral: Negative.    All other systems reviewed and are negative.     Objective:     BP (!) 147/69   Pulse 86   Temp 98.7 F (37.1 C)   Ht 5' 6" (1.676 m)   Wt 165 lb (74.8 kg)    SpO2 94%   BMI 26.63 kg/m  BP Readings from Last 3 Encounters:  12/06/21 (!) 147/69  08/30/21 132/65  08/05/21 139/68      Physical Exam Vitals and nursing note reviewed.  Constitutional:      Appearance: Normal appearance.  HENT:     Head: Normocephalic.     Right Ear: External ear normal.     Nose: Nose normal.     Mouth/Throat:     Mouth: Mucous membranes are moist.  Eyes:     Conjunctiva/sclera: Conjunctivae normal.     Pupils: Pupils are equal, round, and reactive to light.  Cardiovascular:     Rate and Rhythm: Normal rate and regular rhythm.     Pulses: Normal pulses.     Heart sounds: Normal heart sounds.  Pulmonary:     Effort: Pulmonary effort is normal.     Breath sounds: Normal breath sounds.  Abdominal:     General: Bowel sounds are normal.  Skin:    General: Skin is warm.     Findings: No erythema or rash.  Neurological:     General: No focal deficit present.     Mental Status: He is alert and oriented to person, place, and time.      No results found for any visits on 12/06/21.  Last CBC Lab Results  Component Value Date   WBC 9.4 05/17/2021   HGB 12.2 (L) 05/18/2021   HCT 37.1 (L) 05/18/2021   MCV 87.5 05/17/2021   MCH 26.5 05/17/2021   RDW 14.6 05/17/2021   PLT 267 05/17/2021  Last metabolic panel Lab Results  Component Value Date   GLUCOSE 140 (H) 08/11/2021   NA 140 08/11/2021   K 4.5 08/11/2021   CL 106 08/11/2021   CO2 23 08/11/2021   BUN 14 08/11/2021   CREATININE 1.38 (H) 08/11/2021   EGFR 55 (L) 08/11/2021   CALCIUM 9.2 08/11/2021   PROT 7.1 04/19/2021   ALBUMIN 4.2 04/19/2021   LABGLOB 2.9 04/19/2021   AGRATIO 1.4 04/19/2021   BILITOT 0.5 04/19/2021   ALKPHOS 120 04/19/2021   AST 20 04/19/2021   ALT 11 04/19/2021   ANIONGAP 9 05/17/2021   Last lipids Lab Results  Component Value Date   CHOL 113 08/05/2021   HDL 35 (L) 08/05/2021   LDLCALC 65 08/05/2021   TRIG 59 08/05/2021   CHOLHDL 3.2 08/05/2021   Last  hemoglobin A1c Lab Results  Component Value Date   HGBA1C 5.1 08/05/2021   Last thyroid functions Lab Results  Component Value Date   TSH 1.720 03/11/2019   T4TOTAL 7.7 04/25/2016      The 10-year ASCVD risk score (Arnett DK, et al., 2019) is: 65.9%    Assessment & Plan:   Problem List Items Addressed This Visit       Cardiovascular and Mediastinum   Essential hypertension    Slightly elevated blood pressure in clinic today.  I discussed with patient, patient reports that this blood pressure is better than what he normally has and is now willing to make any changes to his medication.  Provided education to patient to take all medication as prescribed, reduce salt in diet.  Follow-up as scheduled.      Relevant Medications   hydrALAZINE (APRESOLINE) 25 MG tablet   Chronic heart failure with preserved ejection fraction (HCC)   Relevant Medications   hydrALAZINE (APRESOLINE) 25 MG tablet     Other   Annual physical exam - Primary    Completed annual physical exam.  Education provided to patient on health maintenance and preventative care.  Printed handouts given.  Follow-up in 1 year.       Return in about 1 year (around 12/07/2022) for annual physical .    Ivy Lynn, NP

## 2021-12-06 NOTE — Assessment & Plan Note (Signed)
Slightly elevated blood pressure in clinic today.  I discussed with patient, patient reports that this blood pressure is better than what he normally has and is now willing to make any changes to his medication.  Provided education to patient to take all medication as prescribed, reduce salt in diet.  Follow-up as scheduled.

## 2021-12-15 ENCOUNTER — Other Ambulatory Visit: Payer: Self-pay | Admitting: Family Medicine

## 2021-12-15 DIAGNOSIS — I2510 Atherosclerotic heart disease of native coronary artery without angina pectoris: Secondary | ICD-10-CM

## 2022-01-03 ENCOUNTER — Other Ambulatory Visit: Payer: Self-pay | Admitting: Nurse Practitioner

## 2022-01-05 ENCOUNTER — Other Ambulatory Visit: Payer: Self-pay | Admitting: Family Medicine

## 2022-01-05 DIAGNOSIS — M1A9XX0 Chronic gout, unspecified, without tophus (tophi): Secondary | ICD-10-CM

## 2022-01-05 DIAGNOSIS — I5032 Chronic diastolic (congestive) heart failure: Secondary | ICD-10-CM

## 2022-01-05 DIAGNOSIS — I10 Essential (primary) hypertension: Secondary | ICD-10-CM

## 2022-01-05 DIAGNOSIS — I2510 Atherosclerotic heart disease of native coronary artery without angina pectoris: Secondary | ICD-10-CM

## 2022-01-06 ENCOUNTER — Other Ambulatory Visit: Payer: Self-pay | Admitting: Nurse Practitioner

## 2022-01-06 NOTE — Telephone Encounter (Signed)
Please have patient make a televisit so I can assess need for medication. Thank you

## 2022-01-06 NOTE — Telephone Encounter (Signed)
Last office visit 12/06/21 Medication is on med list but not prescribed by our office before.  Protocol requires I send to PCP.

## 2022-01-11 ENCOUNTER — Other Ambulatory Visit: Payer: Self-pay | Admitting: Nurse Practitioner

## 2022-01-11 DIAGNOSIS — I2510 Atherosclerotic heart disease of native coronary artery without angina pectoris: Secondary | ICD-10-CM

## 2022-01-13 ENCOUNTER — Ambulatory Visit: Payer: Medicare Other | Admitting: Cardiology

## 2022-01-13 NOTE — Progress Notes (Deleted)
Clinical Summary Tommy Douglas is a 71 y.o.male  seen today for follow up of the following medical problems.    1. CAD - history of prior LAD and RCA stenting.  - last intervention 11/2016 with DES to mid RCA, small vessel residual disease PDA and PL branches too small for intervention. Subtotal LCX with collaterals     04/2021 nuclear stress inferior/inferolateral infarct mild to moderate peri-infarct ischemia. Low to intermeidate risk - no recent chest pains.    2. PAD - followed by Tommy Douglas - prior left exertnal iliac stent.    12/2019 LE arterial US: 30-49% mid and distal SFA. Patent left prox mid exteranl iliac stent.  12/2019 ABI: 0.9 Left 1.03 though noncompressible - some recent ongoing left groin pain   - has not wanted to follow up.      3. Hyperlipidemia - 08/2019 TC 120 TG 218 HDL 21 LDL 63 - side effects on more potent statins.    -recent significant elevation in LDL - he is on pravastatin 80mg  daily, bemepdoic acid/zetia -12/2020 TC 139 TG 136 HDL 29 LDL 86 - he was then started on repatha.  - Jan 2023 TC 108 TG 111 HDL 33 LDL 54 - 07/2021 TC 113 TG 59 HDL 35 LDL 65       4. Chronic diastolic HF - recent issues with edema, multiple visits with our PAs with improved swelling.  Taking lasix 40mg  alt days with 20mg  - home weight 155-162 lbs - 08/11/21 Cr 1.38 K 4.5   5. CKD 3 - Cr has been stable     6. HTN - home bp's 135/60s    -last visit increased norvasc to 10mg  daily, had some LE edema and decreased to 7.5mg   Past Medical History:  Diagnosis Date   Anxiety    Arthritis    "qwhere" (12/20/2016)   Chronic lower back pain    CKD (chronic kidney disease), stage IV (HCC)    Complication of anesthesia    "got the wrong kind of anesthesia ~ 2000 when they were looking down into my stomach"   Coronary artery disease    Diabetic nephropathy ([place])    Gout    Heart disease    Hyperlipidemia    Hypertension    Pneumonia ~ 2015   Seasonal  allergies    Type II diabetes mellitus (HCC)      Allergies  Allergen Reactions   Atorvastatin     All statins make him cough   Statins Cough    Pt is currently taking pravastatin    Sulfa Antibiotics    Lexapro [Escitalopram] Palpitations     Current Outpatient Medications  Medication Sig Dispense Refill   albuterol (VENTOLIN HFA) 108 (90 Base) MCG/ACT inhaler Inhale 2 puffs into the lungs every 6 (six) hours as needed for wheezing or shortness of breath. 18 g 5   allopurinol (ZYLOPRIM) 100 MG tablet TAKE ONE (1) TABLET BY MOUTH 3 TIMES DAILY 270 tablet 0   amLODipine (NORVASC) 5 MG tablet Take 1.5 tablets (7.5 mg total) by mouth daily. 135 tablet 1   aspirin EC 81 MG tablet Take 81 mg by mouth daily.     carvedilol (COREG) 25 MG tablet TAKE ONE (1) TABLET BY MOUTH TWO (2) TIMES DAILY 180 tablet 1   cetirizine (ZYRTEC) 10 MG tablet Take 1 tablet (10 mg total) by mouth daily as needed (for allergies.). (Patient taking differently: Take 10 mg by mouth daily as  needed for allergies.) 90 tablet 3   clopidogrel (PLAVIX) 75 MG tablet TAKE ONE TABLET BY MOUTH AT BEDTIME 90 tablet 1   cyclobenzaprine (FLEXERIL) 10 MG tablet TAKE 1 TABLET AT BEDTIME AS NEEDED FOR MUSCLE SPASMS (Patient taking differently: Take 10 mg by mouth at bedtime as needed for muscle spasms.) 90 tablet 1   Evolocumab (REPATHA SURECLICK) 509 MG/ML SOAJ Inject 140 mg into the skin every 14 (fourteen) days. STOP NEXLIZET 2 mL 6   furosemide (LASIX) 40 MG tablet TAKE ONE (1) TABLET BY MOUTH EVERY DAY 90 tablet 3   hydrALAZINE (APRESOLINE) 25 MG tablet TAKE ONE (1) TABLET BY MOUTH 3 TIMES DAILY 90 tablet 2   HYDROcodone-acetaminophen (NORCO) 10-325 MG tablet Take 1 tablet by mouth 4 (four) times daily.     Insulin Pen Needle (GNP ULTICARE PEN NEEDLES) 32G X 6 MM MISC Use to give insulin daily Dx E11.22 100 each 3   isosorbide mononitrate (IMDUR) 60 MG 24 hr tablet TAKE ONE (1) TABLET BY MOUTH EVERY DAY 90 tablet 1    lisinopril (ZESTRIL) 40 MG tablet Take 1 tablet (40 mg total) by mouth at bedtime. 90 tablet 1   Multiple Vitamin (MULTIVITAMIN WITH MINERALS) TABS tablet Take 1 tablet by mouth daily. Centrum Silver     nitroGLYCERIN (NITROSTAT) 0.4 MG SL tablet Place 1 tablet (0.4 mg total) under the tongue every 5 (five) minutes as needed for chest pain. 25 tablet 3   pantoprazole (PROTONIX) 40 MG tablet TAKE ONE TABLET BY MOUTH EVERY MORNING 90 tablet 0   pregabalin (LYRICA) 75 MG capsule Take 75 mg by mouth 3 (three) times daily.     No current facility-administered medications for this visit.     Past Surgical History:  Procedure Laterality Date   ABDOMINAL AORTOGRAM W/LOWER EXTREMITY N/A 06/02/2019   Procedure: ABDOMINAL AORTOGRAM W/LOWER EXTREMITY;  Surgeon: Lorretta Harp, MD;  Location: [facility];  Service: Cardiovascular;  Laterality: N/A;   CARDIAC CATHETERIZATION  12/20/2016   CORONARY ANGIOPLASTY WITH STENT PLACEMENT  2004 X 2   "1st stent moved"   CORONARY STENT INTERVENTION N/A 12/21/2016   Procedure: CORONARY STENT INTERVENTION;  Surgeon: Burnell Blanks, MD;  Location: [facility];  Service: Cardiovascular;  Laterality: N/A;   ESOPHAGOGASTRODUODENOSCOPY  ~ 2000   LAPAROSCOPIC CHOLECYSTECTOMY     LEFT HEART CATH AND CORONARY ANGIOGRAPHY N/A 12/20/2016   Procedure: LEFT HEART CATH AND CORONARY ANGIOGRAPHY;  Surgeon: Burnell Blanks, MD;  Location: [facility];  Service: Cardiovascular;  Laterality: N/A;     Allergies  Allergen Reactions   Atorvastatin     All statins make him cough   Statins Cough    Pt is currently taking pravastatin    Sulfa Antibiotics    Lexapro [Escitalopram] Palpitations      Family History  Problem Relation Age of Onset   Cancer Mother        male   Diabetes Mother    Stroke Father        x3   Stroke Brother        x2    Other Sister        bowel necrosis?    Other Daughter        gout   Other Son         gout   Alcohol abuse Brother    Other Sister        pneumonia   Other Sister  pneumonia   Cancer Daughter        male   Other Daughter        fluid on brain - disabled      Social History Tommy Douglas reports that he has been smoking cigarettes. He has a 22.00 pack-year smoking history. He has never used smokeless tobacco. Mr. Doswell reports no history of alcohol use.   Review of Systems CONSTITUTIONAL: No weight loss, fever, chills, weakness or fatigue.  HEENT: Eyes: No visual loss, blurred vision, double vision or yellow sclerae.No hearing loss, sneezing, congestion, runny nose or sore throat.  SKIN: No rash or itching.  CARDIOVASCULAR:  RESPIRATORY: No shortness of breath, cough or sputum.  GASTROINTESTINAL: No anorexia, nausea, vomiting or diarrhea. No abdominal pain or blood.  GENITOURINARY: No burning on urination, no polyuria NEUROLOGICAL: No headache, dizziness, syncope, paralysis, ataxia, numbness or tingling in the extremities. No change in bowel or bladder control.  MUSCULOSKELETAL: No muscle, back pain, joint pain or stiffness.  LYMPHATICS: No enlarged nodes. No history of splenectomy.  PSYCHIATRIC: No history of depression or anxiety.  ENDOCRINOLOGIC: No reports of sweating, cold or heat intolerance. No polyuria or polydipsia.  [name]   Physical Examination There were no vitals filed for this visit. There were no vitals filed for this visit.  Gen: resting comfortably, no acute distress HEENT: no scleral icterus, pupils equal round and reactive, no palptable cervical adenopathy,  CV Resp: Clear to auscultation bilaterally GI: abdomen is soft, non-tender, non-distended, normal bowel sounds, no hepatosplenomegaly MSK: extremities are warm, no edema.  Skin: warm, no rash Neuro:  no focal deficits Psych: appropriate affect   Diagnostic Studies  12/2019 LE [place] Summary:  Left: No significant change as compared to previous study. Heterogenous  plaque  throughout.  30-49% stenosis in the mid and distal SFA.   No significant findings noted in the left groin during supine and standing  positions. There is a lymph node at area of pain, measuring 1.5 x .90 x  .98 cm. This lymph node appears to be normal. Inguinal hernia not evident,  but can not be excluded.    09/2017 echo Study Conclusions   - Procedure narrative: Transthoracic echocardiography. Image    quality was adequate. The study was technically difficult, as a    result of poor patient compliance.  - Left ventricle: The cavity size was normal. There was mild    concentric and moderate basal septal hypertrophy. Systolic    function was normal. The estimated ejection fraction was in the    range of 60% to 65%. Wall motion was normal; there were no    regional wall motion abnormalities. Doppler parameters are    consistent with abnormal left ventricular relaxation (grade 1    diastolic dysfunction). Doppler parameters are consistent with    indeterminate ventricular filling pressure.  - Aortic valve: Mildly calcified annulus. Trileaflet; normal    thickness leaflets.  - Atrial septum: No defect or patent foramen ovale was identified.  - Tricuspid valve: There was mild regurgitation.  - Pericardium, extracardiac: A prominent pericardial fat pad was    present.    11/2016 cath   Ost RCA to Mid RCA lesion, 0 %stenosed. Prox RCA lesion, 40 %stenosed. Mid RCA to Dist RCA lesion, 90 %stenosed. Dist RCA lesion, 99 %stenosed. Ost RPDA lesion, 99 %stenosed. Prox Cx to Mid Cx lesion, 99 %stenosed. Ost 3rd Mrg to 3rd Mrg lesion, 80 %stenosed. Ost LAD to Prox LAD lesion, 10 %stenosed. Prox LAD  to Mid LAD lesion, 40 %stenosed. Dist LAD lesion, 50 %stenosed.   1. Triple vessel CAD 2. The LAD is a large caliber vessel that courses to the apex. The proximal vessel appears to have a stent but this could be heavy calcification. (No previous cath records available). The mid and distal  vessel has mild to moderate non-obstructive disease.  3. The Circumflex has a sub-total occlusion in the proximal segment and fills distally from right to left and left to left collaterals.  4. The RCA is a large dominant vessel with a patent proximal stent. The stented segment has focal moderate restenosis. The mid vessel has severe stenosis. The small to moderate caliber PDA and posterolateral artery have severe stenosis.  5. Normal filling pressures.    Recommendations: He has stage 3 CKD. He has severe CAD. I think we should consider PCI of the RCA and possible the Circumflex artery. Given his renal dysfunction, will admit and hydrate today and plan PCI later this week. Continue ASA/Plavix.    11/2016 cath intervention 1. Unstable angina with inferior wall ischemia on nuclear stress test and severe mid RCA stenosis on diagnostic cath yesterday. Successful PTCA/DES x 1 mid RCA. There is residual disease in the small caliber PDA and posterolateral branches. These vessels are too small for PCI.  2. The IC team has reviewed the Circumflex artery disease. This is a small to moderate caliber artery with sub-total proximal to mid occlusion and diffuse disease throughout with collateral filling. Will not attempt PCI of this vessel.    Continue DAPT for one year with ASA and Plavix.    04/2021 nuclear stress  Findings are consistent with prior inferior/inferolateral myocardial infarction with mild to moderate peri-infarct ischemia.   Low to intermediate risk   No ST deviation was noted.   LV perfusion is abnormal.   Left ventricular function is normal. End diastolic cavity size is normal.   Assessment and Plan   1. CAD - no symptoms, continue current meds   2. PAD - prior stent, recent imaging was normal - has not wanted to f/u, monitor at this time. Could establish with vein and vascular if recurrent issues in the future.    3. Hyperlipidemia -at goal, continue currentmeds   4. HTN -above  goal, increase norvasc to 10mg  daily.        5. Carotid stenosis - mild bilateral disease on recent US, continue to monitor.   Low to intermediate risk stress test. Recommend proceeding with hip surgery as planned.   Tommy Douglas, M.D., F.A.C.C.

## 2022-02-02 ENCOUNTER — Telehealth: Payer: Self-pay | Admitting: Pharmacist

## 2022-02-02 ENCOUNTER — Telehealth: Payer: Medicare Other

## 2022-02-02 NOTE — Telephone Encounter (Signed)
Left message with patient re: [employer] cholesterol grant fro [id number] Lipid Panel     Component Value Date/Time   CHOL 113 08/05/2021 1054   TRIG 59 08/05/2021 1054   HDL 35 (L) 08/05/2021 1054   CHOLHDL 3.2 08/05/2021 1054   LDLCALC 65 08/05/2021 1054   LABVLDL 13 08/05/2021 1054

## 2022-02-18 ENCOUNTER — Other Ambulatory Visit: Payer: Self-pay | Admitting: Cardiology

## 2022-03-07 ENCOUNTER — Other Ambulatory Visit: Payer: Self-pay | Admitting: Nurse Practitioner

## 2022-03-07 DIAGNOSIS — I2510 Atherosclerotic heart disease of native coronary artery without angina pectoris: Secondary | ICD-10-CM

## 2022-03-16 ENCOUNTER — Other Ambulatory Visit: Payer: Self-pay | Admitting: Family Medicine

## 2022-04-04 ENCOUNTER — Telehealth: Payer: Self-pay

## 2022-04-04 NOTE — Telephone Encounter (Signed)
PA Approved   Tommy Douglas (Key: [id number]) PA Case ID #: [id number] Need Help? Call us at [phone] Outcome Approved on March 17, 2022 PA Case: [id number], Status: Approved, Coverage Starts on: 12/16/2021 12:00:00 AM, Coverage Ends on: 03/17/2023 12:00:00 AM. Authorization Expiration Date: 03/16/2023

## 2022-04-10 ENCOUNTER — Other Ambulatory Visit: Payer: Self-pay | Admitting: Nurse Practitioner

## 2022-04-18 ENCOUNTER — Encounter: Payer: Self-pay | Admitting: *Deleted

## 2022-06-06 ENCOUNTER — Ambulatory Visit: Payer: Medicare Other | Admitting: Nurse Practitioner

## 2022-06-06 ENCOUNTER — Ambulatory Visit: Payer: Medicare Other | Admitting: Family Medicine

## 2022-06-07 ENCOUNTER — Encounter: Payer: Self-pay | Admitting: Nurse Practitioner

## 2022-06-27 ENCOUNTER — Other Ambulatory Visit: Payer: Self-pay | Admitting: Family Medicine

## 2022-07-04 ENCOUNTER — Telehealth: Payer: Self-pay

## 2022-07-04 ENCOUNTER — Other Ambulatory Visit: Payer: Self-pay | Admitting: Nurse Practitioner

## 2022-07-04 NOTE — Telephone Encounter (Signed)
Tommy Douglas (Key: [id number]) Repatha SureClick 140MG /ML auto-injectors Form [employer] Part D Electronic Prior Authorization Form (2017 [place]) Created 20 hours ago Sent to Plan 19 hours ago Plan Response 19 hours ago Submit Clinical Questions less than a minute ago Determination Wait for Determination Please wait for [employer] 2017 NCP[employer] to return a determination.

## 2022-07-06 ENCOUNTER — Other Ambulatory Visit (HOSPITAL_COMMUNITY): Payer: Self-pay

## 2022-07-06 NOTE — Telephone Encounter (Signed)
Patient Advocate Encounter  Prior Authorization for Repatha SureClick 140MG /ML auto-injectors has been approved.    PA# [id number] Authorization Expiration Date: January 03, 2023

## 2022-07-17 ENCOUNTER — Other Ambulatory Visit: Payer: Self-pay | Admitting: Family Medicine

## 2022-08-14 NOTE — Telephone Encounter (Signed)
Erroneous encounter will close.

## 2022-10-22 ENCOUNTER — Other Ambulatory Visit: Payer: Self-pay | Admitting: Family Medicine

## 2022-12-07 ENCOUNTER — Telehealth: Payer: Self-pay

## 2022-12-07 ENCOUNTER — Other Ambulatory Visit (HOSPITAL_COMMUNITY): Payer: Self-pay

## 2022-12-07 NOTE — Telephone Encounter (Signed)
Pharmacy Patient Advocate Encounter   Received notification from CoverMyMeds that prior authorization for REPATHA is required/requested.   Insurance verification completed.   The patient is insured through [facility] .   Per test claim: PA required; PA submitted to [facility] via CoverMyMeds Key/confirmation #/EOC Key: [id number]    Status is pending

## 2022-12-11 ENCOUNTER — Other Ambulatory Visit (HOSPITAL_COMMUNITY): Payer: Self-pay

## 2022-12-11 NOTE — Telephone Encounter (Signed)
Pharmacy Patient Advocate Encounter  Received notification from [facility] that Prior Authorization for REPATHA has been APPROVED from to 12.31.24. Ran test claim, Copay is $11.28. This test claim was processed through [facility]- copay amounts may vary at other pharmacies due to pharmacy/plan contracts, or as the patient moves through the different stages of their insurance plan.   PA #/Case ID/Reference #: Key: [id number]

## 2023-02-20 ENCOUNTER — Other Ambulatory Visit (HOSPITAL_COMMUNITY): Payer: Self-pay
